# Patient Record
Sex: Male | Born: 1955 | Race: White | Hispanic: No | Marital: Single | Smoking: Former smoker
Health system: Southern US, Community
[De-identification: ages and names within clinical notes are randomized; demographics above are authoritative.]

## PROBLEM LIST (undated history)

## (undated) DIAGNOSIS — R011 Cardiac murmur, unspecified: Secondary | ICD-10-CM

## (undated) DIAGNOSIS — J441 Chronic obstructive pulmonary disease with (acute) exacerbation: Secondary | ICD-10-CM

## (undated) DIAGNOSIS — C801 Malignant (primary) neoplasm, unspecified: Secondary | ICD-10-CM

## (undated) DIAGNOSIS — J189 Pneumonia, unspecified organism: Secondary | ICD-10-CM

## (undated) DIAGNOSIS — R0902 Hypoxemia: Secondary | ICD-10-CM

## (undated) DIAGNOSIS — Z720 Tobacco use: Principal | ICD-10-CM

## (undated) DIAGNOSIS — J449 Chronic obstructive pulmonary disease, unspecified: Secondary | ICD-10-CM

## (undated) DIAGNOSIS — R0600 Dyspnea, unspecified: Secondary | ICD-10-CM

## (undated) DIAGNOSIS — M1990 Unspecified osteoarthritis, unspecified site: Secondary | ICD-10-CM

## (undated) DIAGNOSIS — I499 Cardiac arrhythmia, unspecified: Secondary | ICD-10-CM

## (undated) DIAGNOSIS — J439 Emphysema, unspecified: Secondary | ICD-10-CM

## (undated) DIAGNOSIS — Z87442 Personal history of urinary calculi: Secondary | ICD-10-CM

## (undated) HISTORY — DX: Chronic obstructive pulmonary disease, unspecified: J44.9

## (undated) HISTORY — DX: Hypoxemia: R09.02

## (undated) HISTORY — DX: Emphysema, unspecified: J43.9

## (undated) HISTORY — DX: Tobacco use: Z72.0

## (undated) HISTORY — PX: CATARACT EXTRACTION, BILATERAL: SHX1313

## (undated) HISTORY — DX: Chronic obstructive pulmonary disease with (acute) exacerbation: J44.1

---

## 2000-06-18 ENCOUNTER — Emergency Department (HOSPITAL_COMMUNITY): Admission: EM | Admit: 2000-06-18 | Discharge: 2000-06-18 | Payer: Self-pay | Admitting: Emergency Medicine

## 2000-06-19 ENCOUNTER — Emergency Department (HOSPITAL_COMMUNITY): Admission: EM | Admit: 2000-06-19 | Discharge: 2000-06-19 | Payer: Self-pay

## 2000-06-28 ENCOUNTER — Encounter: Payer: Self-pay | Admitting: Urology

## 2000-06-28 ENCOUNTER — Encounter: Admission: RE | Admit: 2000-06-28 | Discharge: 2000-06-28 | Payer: Self-pay | Admitting: Urology

## 2000-07-16 ENCOUNTER — Encounter: Payer: Self-pay | Admitting: Urology

## 2000-07-16 ENCOUNTER — Encounter: Admission: RE | Admit: 2000-07-16 | Discharge: 2000-07-16 | Payer: Self-pay | Admitting: Urology

## 2000-07-31 ENCOUNTER — Encounter: Payer: Self-pay | Admitting: Urology

## 2000-07-31 ENCOUNTER — Ambulatory Visit (HOSPITAL_COMMUNITY): Admission: RE | Admit: 2000-07-31 | Discharge: 2000-07-31 | Payer: Self-pay | Admitting: Urology

## 2008-02-12 ENCOUNTER — Emergency Department (HOSPITAL_COMMUNITY): Admission: EM | Admit: 2008-02-12 | Discharge: 2008-02-12 | Payer: Self-pay | Admitting: Emergency Medicine

## 2010-09-23 NOTE — Op Note (Signed)
[facility]  Patient:    Frank [name], Frank [name]                       MRN: [id number] Proc. Date: 07/31/00 Adm. Date:  [id number] Attending:  Londell Moh                           Operative Report  SERVICE:  Urology.  PREOPERATIVE DIAGNOSES:  Left ureteral calculus.  POSTOPERATIVE DIAGNOSES:  Left ureteral calculus.  PROCEDURE:  Cystoscopy attempted left retrograde, attempted left ureteroscopy.  SURGEON:  Dr. Logan Bores.  ANESTHESIA:  General.  COMPLICATIONS:  Inability to pass ureteral catheter and wire up to stone.  BRIEF HISTORY:  This 55 year old male is known to have a stone in the left ureter. The patient has been carefully followed with serial x-rays and a stone has not passed below the level of the iliac crest. The patient was offered ESWL but he was unhappy with the idea of having to pass the stone fragments and he has requested ureteroscopy with attempted extraction and in situ laser fulguration if indicated. The patient has intermittent pain but has not yet passed the stone. He gave full and informed consent. He fully realizes he may require a stent postoperatively.  DESCRIPTION OF PROCEDURE:  After successful induction of general anesthesia, the patient was placed in the dorsal lithotomy position and prepped with Betadine and draped in the usual sterile fashion. Cystoscopy was performed, the urethra was visualized in its entirety. Beyond the verumontanum, there was lateral lobe hypertrophy which was much larger then one would expect for a male of only age 40 years. The bladder was carefully inspected and was free of any tumor or stones. Both ureteral orifices appeared unremarkable in terms of configuration and location. The patient had moderate trabeculation. The angle of the left ureter was noted to be extremely tight and multiple attempts at successful cannulation were unsuccessful. This was initially tried with the open end catheter  with the ureteral guidewire in place. A whistle-tip catheter was attempted as was a spiral-tip catheter. The ureteroscope was introduced and the ureteral opening was visualized and partially cannulated but a wire could not pass off at such an acute angle up into the ureter. The Glidewire was also tense and this was unsuccessful. A decision was made to discontinue the procedure, schedule the patient for an IVP and visualization of the distal ureter. If the patient has a stone that has moved in the position where it is not amendable to ESWL, will recommend temporary left percutaneous nephrostomy tube placement, passage of a guidewire down to the kidney and then use of that guidewire to dilate up the ureter and extract the stone. The patient will be scheduled for an IVP later today. DD:  07/31/00 TD:  07/31/00 Job: [id number]

## 2011-02-06 LAB — POCT CARDIAC MARKERS
Myoglobin, poc: 67.6
Troponin i, poc: <0.05

## 2011-02-06 LAB — DIFFERENTIAL
Basophils Absolute: 0.1
Basophils Relative: 1
Eosinophils Absolute: 0.1
Neutro Abs: 5.1
Neutrophils Relative %: 63

## 2011-02-06 LAB — POCT I-STAT, CHEM 8
Chloride: 101
HCT: 52
Hemoglobin: 17.7 — ABNORMAL HIGH
Potassium: 4.1
Sodium: 138

## 2011-02-06 LAB — CBC
HCT: 48.5
Hemoglobin: 16.2
MCHC: 33.4
MCV: 88.9
Platelets: 240
RBC: 5.46
RDW: 13
WBC: 8.2

## 2012-06-05 ENCOUNTER — Encounter (HOSPITAL_COMMUNITY): Payer: Self-pay

## 2012-06-05 ENCOUNTER — Inpatient Hospital Stay (HOSPITAL_COMMUNITY)
Admission: EM | Admit: 2012-06-05 | Discharge: 2012-06-10 | DRG: 088 | Disposition: A | Discharge status: Home / Self Care | Payer: BC Managed Care – PPO | Attending: Infectious Disease | Admitting: Infectious Disease

## 2012-06-05 ENCOUNTER — Emergency Department (HOSPITAL_COMMUNITY): Payer: BC Managed Care – PPO

## 2012-06-05 DIAGNOSIS — Z720 Tobacco use: Secondary | ICD-10-CM

## 2012-06-05 DIAGNOSIS — R0902 Hypoxemia: Secondary | ICD-10-CM

## 2012-06-05 DIAGNOSIS — Z792 Long term (current) use of antibiotics: Secondary | ICD-10-CM

## 2012-06-05 DIAGNOSIS — IMO0002 Reserved for concepts with insufficient information to code with codable children: Secondary | ICD-10-CM

## 2012-06-05 DIAGNOSIS — J069 Acute upper respiratory infection, unspecified: Secondary | ICD-10-CM | POA: Diagnosis present

## 2012-06-05 DIAGNOSIS — Z79899 Other long term (current) drug therapy: Secondary | ICD-10-CM

## 2012-06-05 DIAGNOSIS — Z9981 Dependence on supplemental oxygen: Secondary | ICD-10-CM | POA: Diagnosis present

## 2012-06-05 DIAGNOSIS — J449 Chronic obstructive pulmonary disease, unspecified: Secondary | ICD-10-CM

## 2012-06-05 DIAGNOSIS — J9611 Chronic respiratory failure with hypoxia: Secondary | ICD-10-CM | POA: Diagnosis present

## 2012-06-05 DIAGNOSIS — F17200 Nicotine dependence, unspecified, uncomplicated: Secondary | ICD-10-CM | POA: Diagnosis present

## 2012-06-05 DIAGNOSIS — J439 Emphysema, unspecified: Secondary | ICD-10-CM

## 2012-06-05 DIAGNOSIS — J441 Chronic obstructive pulmonary disease with (acute) exacerbation: Principal | ICD-10-CM | POA: Diagnosis present

## 2012-06-05 DIAGNOSIS — I4510 Unspecified right bundle-branch block: Secondary | ICD-10-CM | POA: Diagnosis present

## 2012-06-05 HISTORY — DX: Tobacco use: Z72.0

## 2012-06-05 HISTORY — DX: Chronic obstructive pulmonary disease, unspecified: J44.9

## 2012-06-05 LAB — BASIC METABOLIC PANEL
BUN: 18 mg/dL (ref 6–23)
Calcium: 9.1 mg/dL (ref 8.4–10.5)
GFR calc Af Amer: >90 mL/min (ref >90)
GFR calc non Af Amer: >90 mL/min (ref >90)
Glucose, Bld: 100 mg/dL — ABNORMAL HIGH (ref 70–99)
Potassium: 4 mEq/L (ref 3.5–5.1)
Sodium: 136 mEq/L (ref 135–145)

## 2012-06-05 LAB — CBC WITH DIFFERENTIAL/PLATELET
Basophils Absolute: 0 10*3/uL (ref 0.0–0.1)
Eosinophils Absolute: 0.1 10*3/uL (ref 0.0–0.7)
HCT: 47.8 % (ref 39.0–52.0)
Lymphocytes Relative: 20 % (ref 12–46)
Lymphs Abs: 1.7 10*3/uL (ref 0.7–4.0)
MCHC: 34.1 g/dL (ref 30.0–36.0)
Monocytes Relative: 14 % — ABNORMAL HIGH (ref 3–12)
Neutro Abs: 5.4 10*3/uL (ref 1.7–7.7)
Platelets: 209 10*3/uL (ref 150–400)
RDW: 12.8 % (ref 11.5–15.5)
WBC: 8.4 10*3/uL (ref 4.0–10.5)

## 2012-06-05 LAB — CG4 I-STAT (LACTIC ACID): Lactic Acid, Venous: 0.85 mmol/L (ref 0.5–2.2)

## 2012-06-05 LAB — POCT I-STAT 3, ART BLOOD GAS (G3+)
Bicarbonate: 27.6 mEq/L — ABNORMAL HIGH (ref 20.0–24.0)
Patient temperature: 97.7
TCO2: 29 mmol/L (ref 0–100)
pCO2 arterial: 43.7 mmHg (ref 35.0–45.0)
pH, Arterial: 7.406 (ref 7.350–7.450)
pO2, Arterial: 62 mmHg — ABNORMAL LOW (ref 80.0–100.0)

## 2012-06-05 LAB — URINALYSIS, ROUTINE W REFLEX MICROSCOPIC
Bilirubin Urine: NEGATIVE
Ketones, ur: 15 mg/dL — AB
Leukocytes, UA: NEGATIVE
Nitrite: NEGATIVE
Protein, ur: NEGATIVE mg/dL
Urobilinogen, UA: 2 mg/dL — ABNORMAL HIGH (ref 0.0–1.0)

## 2012-06-05 MED ORDER — PREDNISONE 20 MG PO TABS
60.0000 mg | ORAL_TABLET | Freq: Once | ORAL | Status: AC
  Administered 2012-06-05: 60 mg via ORAL
  Filled 2012-06-05: qty 3

## 2012-06-05 MED ORDER — SODIUM CHLORIDE 0.9 % IJ SOLN
3.0000 mL | INTRAMUSCULAR | Status: DC | PRN
  Administered 2012-06-08: 3 mL via INTRAVENOUS

## 2012-06-05 MED ORDER — DEXTROSE 5 % IV SOLN
500.0000 mg | Freq: Once | INTRAVENOUS | Status: AC
  Administered 2012-06-05: 500 mg via INTRAVENOUS
  Filled 2012-06-05: qty 500

## 2012-06-05 MED ORDER — ALBUTEROL SULFATE (5 MG/ML) 0.5% IN NEBU
2.5000 mg | INHALATION_SOLUTION | Freq: Once | RESPIRATORY_TRACT | Status: AC
  Administered 2012-06-05: 2.5 mg via RESPIRATORY_TRACT
  Filled 2012-06-05: qty 0.5

## 2012-06-05 MED ORDER — SODIUM CHLORIDE 0.9 % IJ SOLN
3.0000 mL | Freq: Two times a day (BID) | INTRAMUSCULAR | Status: DC
  Administered 2012-06-06 – 2012-06-10 (×9): 3 mL via INTRAVENOUS

## 2012-06-05 MED ORDER — IPRATROPIUM BROMIDE 0.02 % IN SOLN
0.5000 mg | RESPIRATORY_TRACT | Status: DC
  Administered 2012-06-05: 12:00:00 via RESPIRATORY_TRACT
  Administered 2012-06-05 – 2012-06-07 (×10): 0.5 mg via RESPIRATORY_TRACT
  Filled 2012-06-05 (×11): qty 2.5

## 2012-06-05 MED ORDER — SODIUM CHLORIDE 0.9 % IV SOLN
1000.0000 mL | INTRAVENOUS | Status: DC
  Administered 2012-06-05 (×4): 1000 mL via INTRAVENOUS

## 2012-06-05 MED ORDER — SODIUM CHLORIDE 0.9 % IV SOLN: 250.0000 mL | INTRAVENOUS | Status: DC | PRN

## 2012-06-05 MED ORDER — ENOXAPARIN SODIUM 40 MG/0.4ML ~~LOC~~ SOLN
40.0000 mg | SUBCUTANEOUS | Status: DC
  Administered 2012-06-05 – 2012-06-09 (×5): 40 mg via SUBCUTANEOUS
  Filled 2012-06-05 (×6): qty 0.4

## 2012-06-05 MED ORDER — DEXTROSE 5 % IV SOLN
1.0000 g | Freq: Once | INTRAVENOUS | Status: AC
  Administered 2012-06-05: 1 g via INTRAVENOUS
  Filled 2012-06-05: qty 10

## 2012-06-05 MED ORDER — IPRATROPIUM BROMIDE 0.02 % IN SOLN
RESPIRATORY_TRACT | Status: AC
  Filled 2012-06-05: qty 2.5

## 2012-06-05 MED ORDER — ALBUTEROL SULFATE (5 MG/ML) 0.5% IN NEBU
INHALATION_SOLUTION | RESPIRATORY_TRACT | Status: AC
  Filled 2012-06-05: qty 1

## 2012-06-05 MED ORDER — LORAZEPAM 0.5 MG PO TABS
0.5000 mg | ORAL_TABLET | Freq: Once | ORAL | Status: DC
  Filled 2012-06-05: qty 1

## 2012-06-05 MED ORDER — ALBUTEROL SULFATE (5 MG/ML) 0.5% IN NEBU
2.5000 mg | INHALATION_SOLUTION | RESPIRATORY_TRACT | Status: DC
  Administered 2012-06-05: 13:00:00 via RESPIRATORY_TRACT
  Administered 2012-06-05 – 2012-06-07 (×10): 2.5 mg via RESPIRATORY_TRACT
  Filled 2012-06-05 (×10): qty 0.5

## 2012-06-05 MED ORDER — IPRATROPIUM BROMIDE 0.02 % IN SOLN
0.5000 mg | Freq: Once | RESPIRATORY_TRACT | Status: AC
  Administered 2012-06-05: 0.5 mg via RESPIRATORY_TRACT
  Filled 2012-06-05: qty 2.5

## 2012-06-05 MED ORDER — NICOTINE 21 MG/24HR TD PT24
21.0000 mg | MEDICATED_PATCH | Freq: Every day | TRANSDERMAL | Status: DC
  Administered 2012-06-05 – 2012-06-10 (×6): 21 mg via TRANSDERMAL
  Filled 2012-06-05 (×6): qty 1

## 2012-06-05 NOTE — ED Notes (Signed)
Pt placed on portable cardiac monitor and 3L o2 for transport. EMT to take pt.

## 2012-06-05 NOTE — ED Notes (Signed)
Patients urine collected.  Label placed on patients urine.  Name and and mrn number checked.  Employee number, initials, date and time placed on the label. Tubed.

## 2012-06-05 NOTE — H&P (Signed)
Internal Medicine Teaching Service Resident Admission Note Date: 06/05/2012  Patient name: Frank [name] Medical record number: [id number] Date of birth: May 06, 1956 Age: 57 y.o. Gender: male PCP: Default, Provider, MD  Medical Service: Internal Medicine Teaching Service-[facility]  I have reviewed the note by Gentry Roch MS 4 and was present during the interview and physical exam.  Please see below for findings, assessment, and plan.  Chief Complaint: shortness of breath  History of Present Illness:  Frank [name]  is a 57 yo male with presumed COPD who presents to [facility] ED for shortness of breath and low oxygen levels noted on home monitoring today.  He has been treated for upper respiratory infection and COPD exacerbation at [facility] several times over the past 5 days.  He was prescribed a course of azithromycin, prednisone, cough suppressants, nebulized albuterol and Advair diskus (fluticasone-salmeterol) inhaler with some improvement in his symptoms of runny nose, nasal congestion, headfullness and dry cough but states that he is still short of breath when he "moves about".  He presented on request of his sister who checked his oxygen level at home and noted that it was "84%".  She states that by coming to the ED Frank [name] would be able to get a primary doctor and oxygen at home. He denies chest pain, orthopnea, headaches, nausea or vomiting but states that he had fever on initial presentation to urgent care several days ago.  Of note, the patient initially requested discharge from the ED but after persuasion by his sister agreed to be admitted.  Meds:  No current facility-administered medications on file prior to encounter.   Current Outpatient Prescriptions on File Prior to Encounter  Medication Sig Dispense Refill  . albuterol (ACCUNEB) 0.63 MG/3ML nebulizer solution Take 1 ampule by nebulization every 6 (six) hours as needed. For cough/wheezing      .  Fluticasone-Salmeterol (ADVAIR) 500-50 MCG/DOSE AEPB Inhale 1 puff into the lungs every 12 (twelve) hours.        Allergies: Allergies as of 06/05/2012  . (No Known Allergies)    Past Medical History: Medical Student note reviewed  Family History: Medical Student note reviewed  Social History: Medical Student note reviewed  Surgical History: Medical Student note reviewed  Review of System: Medical Student note reviewed  Physical Exam: Blood pressure 114/68, pulse 77, temperature 97.7 F (36.5 C), temperature source Oral, resp. rate 22, height 5\' 8"  (1.727 m), weight 167 lb (75.751 kg), SpO2 93.00%. on 3L [place] General: Well-developed, well-nourished, in no acute distress; nasal phonation to his speach Head: Normocephalic, atraumatic. Eyes: PERRLA, EOMI, No signs of anemia or jaundice. Nose: Moist mucous membranes, no purulence, hypertrophied nasal turbinates, rubrous nose with telectangias Throat: Oropharynx nonerythematous, no exudate appreciated, poor dentition Neck: supple, no masses, no carotid Bruits, no JVD appreciated, no thyroidmegaly appreciated Lungs: mildly increased  respiratory effort. Few scattered expiratory wheezes throughout bilateral lung fields, good movement of air Heart: normal rate, regular rhythm, normal S1 and S2, no gallop, murmur, or rubs appreciated. Abdomen: BS normoactive. Soft, obese, non-tender. No masses or organomegaly appreciated, cherry hemangiomas noted Extremities: Trace pretibial edema, distal pulses intact Neurologic: grossly non-focal, alert and oriented x3, appropriate and cooperative throughout examination.   Labs: Reviewed as noted in the Electronic Record  Imaging: Reviewed as noted in the Electronic Record  Assessment & Plan by Problem: 57 yo male with tobacco abuse and presumed COPD recently treated for COPD exacerbation now admitted with shortness of breath and mild  hypoxia.  1. COPD in setting of Upper Respiratory infection  with recent exacerbation: His clinical picture today is that of a recent COPD exacerbation that has been appropriately treated and responded to bronchodilators, steroids, and antibiotics.  He is currently afebrile, without leukocytosis, and with improvement in his cough.  On exam he maintained oxygenation well on room air at 91-92%. He reports continued shortness of breath which has improved from initial presentation to Urgent Care but still causes discomfort.  He has no signs of pulmonary edema, pneumonia or ARDS on chest XRay. His physical exam doesn't demonstrate crackles, increased fremitus or bronchial breaths but given his previous fever, nonproductive cough and rhinitis,  viral pneumonia is a possible etiology for his continued shortness of breath and mild wheezes.  Thus far there is no evidence of cardiac ischemia with negative troponins and EKG with RBBB. Although he is without lower extremity edema, we will need to consider congestive heart failure thus check pro-BNP with consideration for 2D echocardiogram.  He is at low risk for pulmonary embolism given his lack of risk factors (<65, no previous DVT or PE, no recent surgeries, no known malignancy), no lower extremity pain or hemoptysis.  His Geneva score of 3 for heart rate of 77 bpm indicates low risk for PE. Arterial Blood Gas with normal pH, no CO2 retention and decreased p02 of is consistent with hypoxemia and his current levels of ~91-92% on room air. -check influenza panel -check pro-BNP -check Magnesium level given recent beta-adrenergic agonist therapy which may increase renal excretion of calcium and magnesium -trend cardiac enzymes -continue supplemental oxygen -nebulized ipratropium-albuterol q4h -repeat EKG tomorrow -tobacco abuse counseling  -nicotine patch   Lab 06/05/12 1828  PHART 7.406  PCO2ART 43.7  PO2ART 62.0*  HCO3 27.6*  TCO2 29  O2SAT 92.0   2. Hypoxemia: A-a gradient elevated at 34 mmHg (normal for age  ~18.21mmHg), considering causes of hypoxemia including ventilation perfusion mismatch (CXR w/o pneumonia, atelectasis, or ARDS; pro-BNP low and not indicative of CHF); shunt (low risk for PE as noted above, will check ECHO for cardiac structure); alveolar hypoventilation (no known history of interstitial lung disease); hypoventilation secondary to COPD is more probable in this patient. Will likely require home oxygen given initial low oxygen levels of 87% on room air. -will monitor oxygen level while ambulating and at rest on room air -consider check D-dimer given low DVT risk in setting of hypoxemia to r/o PE -cont to encourage tobacco cessation -will need outpatient PFTs after resolution of acute illness  3. Disposition: likely discharge home today after 2D ECHO on bronchodilators.   Will schedule OPC follow-up.  No transportation needs.      SignedKristie Cowman 06/05/2012, 6:10 PM

## 2012-06-05 NOTE — ED Notes (Signed)
Pt was seen at Urgent Care for fever and not feeling good. Had low O2 sats at 84 % and afebrile. Denies any pain. Feels "stuffy" in his sinuses.

## 2012-06-05 NOTE — ED Notes (Signed)
Patient returned from X-ray 

## 2012-06-05 NOTE — ED Notes (Signed)
Brother in law Brett Canales : [phone] going to eat lunch and would like to be notified if pt leaves room.

## 2012-06-05 NOTE — ED Notes (Signed)
Results of lactic acid shown to Dr. Effie Shy

## 2012-06-05 NOTE — ED Provider Notes (Addendum)
History     CSN: [id number]  Arrival date & time 06/05/12  1117   First MD Initiated Contact with Patient 06/05/12 1128      Chief Complaint  Patient presents with  . Fever  . low sats     (Consider location/radiation/quality/duration/timing/severity/associated sxs/prior treatment) HPI Comments:  Frank [name] is a 57 y.o. Male who has been ill for 5 days with shortness of breath, dyspnea on exertion, fever, and cough. He was evaluated and treated at an urgent care center, for pneumonia. His medications included; nebulizer, antibiotic, and antipyretic. He was seen in followup. This morning, found to be hypoxic, and sent here. His room air sat was 82% by report. His wife has been checking his oxygen at home, and found it varying between 84, and 92%; with the lows being while ambulating. There's been no chest pain, nausea, vomiting, weak, as dizziness, or back pain. There are no other modifying factors.  Patient is a 57 y.o. male presenting with fever. The history is provided by the patient and the spouse.  Fever Primary symptoms of the febrile illness include fever.    History reviewed. No pertinent past medical history.  Past Surgical History  Procedure Date  . Cataract extraction, bilateral     No family history on file.  History  Substance Use Topics  . Smoking status: Current Every Day Smoker -- 1.0 packs/day    Types: Cigarettes  . Smokeless tobacco: Not on file  . Alcohol Use: No      Review of Systems  Constitutional: Positive for fever.  All other systems reviewed and are negative.    Allergies  Review of patient's allergies indicates no known allergies.  Home Medications   Current Outpatient Rx  Name  Route  Sig  Dispense  Refill  . ALBUTEROL SULFATE 0.63 MG/3ML IN NEBU   Nebulization   Take 1 ampule by nebulization every 6 (six) hours as needed. For cough/wheezing         . ALBUTEROL SULFATE HFA 108 (90 BASE) MCG/ACT IN AERS   Inhalation  Inhale 1-2 puffs into the lungs every 6 (six) hours as needed. For wheezing         . FLUTICASONE-SALMETEROL 500-50 MCG/DOSE IN AEPB   Inhalation   Inhale 1 puff into the lungs every 12 (twelve) hours.         Marland Kitchen HYDROCOD POLST-CPM POLST ER 10-8 MG PO CP12   Oral   Take 1 capsule by mouth every 12 (twelve) hours.           BP 114/68  Pulse 77  Temp 97.7 F (36.5 C) (Oral)  Resp 22  Ht 5\' 8"  (1.727 m)  Wt 167 lb (75.751 kg)  BMI 25.39 kg/m2  SpO2 93%  Physical Exam  Nursing note and vitals reviewed. Constitutional: He is oriented to person, place, and time. He appears well-developed and well-nourished.  HENT:  Head: Normocephalic and atraumatic.  Right Ear: External ear normal.  Left Ear: External ear normal.  Eyes: Conjunctivae normal and EOM are normal. Pupils are equal, round, and reactive to light.  Neck: Normal range of motion and phonation normal. Neck supple.  Cardiovascular: Normal rate, regular rhythm, normal heart sounds and intact distal pulses.   Pulmonary/Chest: Effort normal. He exhibits no bony tenderness.       Decreased air movement bilaterally with generalized wheezing. Mildly increased work of breathing.  Abdominal: Soft. Normal appearance. There is no tenderness.  Musculoskeletal: Normal range of motion.  Neurological: He is alert and oriented to person, place, and time. He has normal strength. No cranial nerve deficit or sensory deficit. He exhibits normal muscle tone. Coordination normal.  Skin: Skin is warm, dry and intact.  Psychiatric: He has a normal mood and affect. His behavior is normal. Judgment and thought content normal.    ED Course  Procedures (including critical care time)  Emergency department treatment: Oxygen titration on nasal cannula to normal. IV, Rocephin and Zithromax, for possible community-acquired pneumonia. Nebulizer x2.  Reevaluation: 16:50- oxygen removed in his oxygen. Sats dropped to 87% within 5 minutes; indicating  ongoing hypoxia. Prednisone, ordered.     Date: 02/23/2012  Rate: 87  Rhythm: normal sinus rhythm  QRS Axis: normal  PR and QT Intervals: normal  ST/T Wave abnormalities: normal  PR and QRS Conduction Disutrbances:right bundle branch block  Narrative Interpretation:   Old EKG Reviewed: none available        Labs Reviewed  CBC WITH DIFFERENTIAL - Abnormal; Notable for the following:    Monocytes Relative 14 (*)     Monocytes Absolute 1.2 (*)     All other components within normal limits  BASIC METABOLIC PANEL - Abnormal; Notable for the following:    Glucose, Bld 100 (*)     All other components within normal limits  URINALYSIS, ROUTINE W REFLEX MICROSCOPIC - Abnormal; Notable for the following:    Ketones, ur 15 (*)     Urobilinogen, UA 2.0 (*)     All other components within normal limits  CG4 I-STAT (LACTIC ACID)  CULTURE, BLOOD (ROUTINE X 2)  CULTURE, BLOOD (ROUTINE X 2)  URINE CULTURE  INFLUENZA PANEL BY PCR   Dg Chest 2 View  06/05/2012  *RADIOLOGY REPORT*  Clinical Data: Fever.  Shortness of breath.  CHEST - 2 VIEW  Comparison: 02/12/2008  Findings: Lungs appear hyperexpanded with flattening of the hemidiaphragms, increased retrosternal air space and pruning of the pulmonary vasculature in the periphery, suggestive of underlying COPD.  Mild diffuse bronchial wall thickening, similar to prior examinations.  No acute consolidative airspace disease.  No pleural effusions.  No evidence of pulmonary edema.  Heart size and mediastinal contours are within normal limits.  Atherosclerotic calcifications are noted within the arch of the aorta.  IMPRESSION: 1.  Chronic changes compatible with COPD redemonstrated, as above. 2.  Atherosclerosis.   Original Report Authenticated By: Trudie Reed, M.D.    Nursing notes, applicable records and vitals reviewed.  Radiologic Images/Reports reviewed.   1. COPD exacerbation   2. Hypoxia       MDM  COPD exacerbation, with  hypoxia. Patient does not have a primary care provider. Will admit for stabilization. Doubt sepsis.    Plan: Admit    Flint Melter, MD 06/05/12 1740  Flint Melter, MD 06/05/12 1759
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
## 2012-06-05 NOTE — ED Notes (Signed)
Pt states that he is feeling SOB again.  Pt is noted to be sating 91% on 3L [place].  No distress noted.  Bilateral inspiratory and expiratory wheezes noted.  Pt is able to speak in complete sentences.

## 2012-06-06 LAB — URINE CULTURE

## 2012-06-06 LAB — BASIC METABOLIC PANEL
Calcium: 9.1 mg/dL (ref 8.4–10.5)
Creatinine, Ser: 0.76 mg/dL (ref 0.50–1.35)
GFR calc Af Amer: >90 mL/min (ref >90)
GFR calc non Af Amer: >90 mL/min (ref >90)
Sodium: 135 mEq/L (ref 135–145)

## 2012-06-06 LAB — TROPONIN I
Troponin I: <0.3 ng/mL (ref <0.30)
Troponin I: <0.3 ng/mL (ref <0.30)
Troponin I: <0.3 ng/mL (ref <0.30)

## 2012-06-06 LAB — CBC
Platelets: 203 10*3/uL (ref 150–400)
RBC: 5.31 MIL/uL (ref 4.22–5.81)
RDW: 12.8 % (ref 11.5–15.5)
WBC: 8.9 10*3/uL (ref 4.0–10.5)

## 2012-06-06 LAB — LEGIONELLA ANTIGEN, URINE: Legionella Antigen, Urine: NEGATIVE

## 2012-06-06 LAB — INFLUENZA PANEL BY PCR (TYPE A & B)
H1N1 flu by pcr: NOT DETECTED
Influenza A By PCR: NEGATIVE
Influenza B By PCR: NEGATIVE

## 2012-06-06 LAB — HIV ANTIBODY (ROUTINE TESTING W REFLEX): HIV: NONREACTIVE

## 2012-06-06 LAB — PRO B NATRIURETIC PEPTIDE: Pro B Natriuretic peptide (BNP): 18 pg/mL (ref 0–125)

## 2012-06-06 LAB — D-DIMER, QUANTITATIVE: D-Dimer, Quant: <0.27 ug/mL-FEU (ref 0.00–0.48)

## 2012-06-06 MED ORDER — PNEUMOCOCCAL VAC POLYVALENT 25 MCG/0.5ML IJ INJ
0.5000 mL | INJECTION | INTRAMUSCULAR | Status: AC
  Administered 2012-06-07: 0.5 mL via INTRAMUSCULAR
  Filled 2012-06-06: qty 0.5

## 2012-06-06 MED ORDER — METHYLPREDNISOLONE SODIUM SUCC 40 MG IJ SOLR
40.0000 mg | Freq: Once | INTRAMUSCULAR | Status: AC
  Administered 2012-06-06: 40 mg via INTRAVENOUS
  Filled 2012-06-06: qty 1

## 2012-06-06 MED ORDER — INFLUENZA VIRUS VACC SPLIT PF IM SUSP
0.5000 mL | INTRAMUSCULAR | Status: AC
  Administered 2012-06-07: 0.5 mL via INTRAMUSCULAR
  Filled 2012-06-06: qty 0.5

## 2012-06-06 NOTE — Progress Notes (Signed)
Pt oxygen saturations were 88-90% on room air at rest.  Pt ambulated approximately 30 feet with oxygen sats dropping downwards to 78%.  Returned to room and applied 2 liters oxygen via [place].  Sats returned to 92%.  MD in room and spoke with patient about needing home oxygen and need to quit smoking.  Pt resting with call bell within reach.  Will continue to monitor. Thomas Hoff

## 2012-06-06 NOTE — Progress Notes (Signed)
*  PRELIMINARY RESULTS* Echocardiogram 2D Echocardiogram has been performed.  Frank [name] 06/06/2012, 1:57 PM

## 2012-06-06 NOTE — Progress Notes (Addendum)
Resident Addendum to Medical Student Note   I have seen and examined the patient, and agree with the the medical student assessment and plan outlined above. Please see my brief note below for additional details.  S: No acute events overnight.   OBJECTIVE: VS: Reviewed  Meds: Reviewed  Labs: Reviewed  Imaging: Reviewed   Physical Exam: General: Vital signs reviewed and noted. Well-developed, well-nourished, in no acute distress; alert, appropriate and cooperative throughout examination.  HEENT: Normocephalic, atraumatic  Lungs:  Normal respiratory effort. Continued scattered wheezes left lung field and right upper lung fields, no rales appreciated  Heart: RRR. S1 and S2 normal without gallop, murmur, or rubs appreciated.  Abdomen:  BS normoactive. Soft, obese, non-tender.  No masses or organomegaly.  Extremities: No pretibial edema.     ASSESSMENT/ PLAN: Pt is a 57 y.o. yo male with a PMHx of tobacco abuse and recent URI with likely COPD exacerbation who was admitted on 06/05/2012 with symptoms of shortness of breath and hypoxia.   COPD with hypoxia: recent exacerbation secondary to Upper Respiratory Infection, treated with 5 day course azithromycin, prednisone, nebulized albuterol prn and Advair (salmeterol/fluticasone).  -cont Duonebs prn  -cont course of prednisone after Solu-Medrol IV 40 mg x1  -cont Advair  -2D ECHO today  -monitor O2 on room air at rest and ambulation.   DVT ppx: Lovenox [place] qd   Disposition: likely discharge home today or tomorrow following ECHO, f/u OPC   Length of Stay: 1   Agusta Hackenberg  06/06/2012, 10:00 AM

## 2012-06-06 NOTE — H&P (Addendum)
Internal Medicine Attending Admission Note Date: 06/06/2012  Patient name: Frank [name] Medical record number: [id number] Date of birth: [birth year] Age: 57 y.o. Gender: male  I saw and evaluated the patient. I reviewed the resident's note and I agree with the resident's findings and plan as documented in the resident's note, with the following additional comments.  Chief Complaint(s): Shortness of breath  History - key components related to admission: Patient is a 57 year old man with history of presumed COPD (no prior pulmonary function studies per patient), long smoking history, recently treated for COPD exacerbation with oral azithromycin and a short course of prednisone at an outside clinic, admitted with complaint of shortness of breath and hypoxia noted on a home measurement by a family member.   Physical Exam - key components related to admission:  Filed Vitals:   06/06/12 0321 06/06/12 0543 06/06/12 1238 06/06/12 1320  BP:  133/76  152/80  Pulse:  79  88  Temp:  97.9 F (36.6 C)  98.5 F (36.9 C)  TempSrc:  Oral  Oral  Resp:  22  23  Height:      Weight:  173 lb 6.4 oz (78.654 kg)    SpO2: 96% 92% 92% 90%    General: Alert, mildly tachypneic Lungs: Mild diffuse expiratory wheezing Heart: Regular; no S3, no S4, no murmurs Abdomen: Bowel sounds present, soft, nontender; no hepatosplenomegaly Extremities: No edema   Lab results:   Basic Metabolic Panel:  Basename 06/06/12 0640 06/05/12 1128  NA 135 136  K 4.3 4.0  CL 97 96  CO2 26 27  GLUCOSE 117* 100*  BUN 16 18  CREATININE 0.76 0.80  CALCIUM 9.1 9.1  MG -- --  PHOS -- --    CBC:  Basename 06/06/12 0640 06/05/12 1128  WBC 8.9 8.4  NEUTROABS -- 5.4  HGB 15.3 16.3  HCT 45.4 47.8  MCV 85.5 85.5  PLT 203 209    Cardiac Enzymes:  Basename 06/06/12 1159 06/06/12 0640 06/06/12 0040  CKTOTAL -- -- --  CKMB -- -- --  CKMBINDEX -- -- --  TROPONINI <0.30 <0.30 <0.30    ABG (FIO2 not recorded)     Component Value Date/Time   PHART 7.406 06/05/2012 1828   PCO2ART 43.7 06/05/2012 1828   PO2ART 62.0* 06/05/2012 1828   HCO3 27.6* 06/05/2012 1828   TCO2 29 06/05/2012 1828   O2SAT 92.0 06/05/2012 1828     D-Dimer:  Basename 06/06/12 0845  DDIMER <0.27   Urinalysis    Component Value Date/Time   COLORURINE YELLOW 06/05/2012 1236   APPEARANCEUR CLEAR 06/05/2012 1236   LABSPEC 1.023 06/05/2012 1236   PHURINE 6.5 06/05/2012 1236   GLUCOSEU NEGATIVE 06/05/2012 1236   HGBUR NEGATIVE 06/05/2012 1236   BILIRUBINUR NEGATIVE 06/05/2012 1236   KETONESUR 15* 06/05/2012 1236   PROTEINUR NEGATIVE 06/05/2012 1236   UROBILINOGEN 2.0* 06/05/2012 1236   NITRITE NEGATIVE 06/05/2012 1236   LEUKOCYTESUR NEGATIVE 06/05/2012 1236    Imaging results:  Dg Chest 2 View  06/05/2012  *RADIOLOGY REPORT*  Clinical Data: Fever.  Shortness of breath.  CHEST - 2 VIEW  Comparison: 02/12/2008  Findings: Lungs appear hyperexpanded with flattening of the hemidiaphragms, increased retrosternal air space and pruning of the pulmonary vasculature in the periphery, suggestive of underlying COPD.  Mild diffuse bronchial wall thickening, similar to prior examinations.  No acute consolidative airspace disease.  No pleural effusions.  No evidence of pulmonary edema.  Heart size and mediastinal contours are within normal limits.  Atherosclerotic calcifications are noted within the arch of the aorta.  IMPRESSION: 1.  Chronic changes compatible with COPD redemonstrated, as above. 2.  Atherosclerosis.   Original Report Authenticated By: Trudie Reed, M.D.     Other results: EKG: Sinus rhythm; right bundle branch block   Assessment & Plan by Problem:  1.  COPD exacerbation.  Patient has significant chronic obstructive pulmonary disease, possibly exacerbated by recent URI.  He was hypoxic to 87% in the emergency department within 5 minutes of removing his nasal cannula O2; this morning when I saw patient he was hypoxic to 88% on room  air, and dropped to 78% on room air with ambulation.  Plans include steroids; inhaled bronchodilators; supplement oxygen and follow saturations.  He will need home oxygen.  At home he has been on albuterol inhaler and Advair; he would benefit long-term from the addition of an anticholinergic (ipratropium or Spiriva), and he will also need pulmonary function studies.  Smoking cessation is an absolute priority, and I emphasized this to patient.  Given the severity of his illness, outpatient referral to the pulmonary clinic is appropriate.  2.  Smoking.  I talked at length with patient about the importance of smoking cessation, and he agreed to try.  He reports that his current nicotine patch seems to be working well, so this will likely be a good option for assisting him with smoking cessation.  3.  Disposition.  Anticipate possible discharge tomorrow patient if does well overnight.

## 2012-06-06 NOTE — Progress Notes (Signed)
Utilization Review Completed.Frank [name] T1/30/2014   

## 2012-06-06 NOTE — Progress Notes (Signed)
Medical Student Daily Progress Note  Subjective: No acute events overnight.  Patient reports he still has some mild head fullness and nasal congestion.  Reports his breathing and SOB have improved since coming into the hospital and he feels he is back near his baseline.  Denies any fevers, chest pain, chills, nausea or vomiting.  Objective: Vital signs in last 24 hours: Filed Vitals:   06/05/12 2042 06/05/12 2302 06/06/12 0321 06/06/12 0543  BP: 150/90   133/76  Pulse:  76  79  Temp: 97.9 F (36.6 C)   97.9 F (36.6 C)  TempSrc:    Oral  Resp:    22  Height:      Weight:    78.654 kg (173 lb 6.4 oz)  SpO2: 94% 94% 96% 92%    Physical Exam: Vitals reviewed. General: resting in bed, NAD HEENT: atraumatic, normocephalic, EOMI, no scleral icterus Cardiac: RRR, no rubs, murmurs or gallops Pulm: diminished air movement throughout all lung fields, mild expiratory wheezes appreciated throughout all lung fields, no ronchi or wheezes Abd: soft, nontender, nondistended, BS normoactive Ext: warm and well perfused, no pedal edema Neuro: alert and oriented X3, cranial nerves II-XII grossly intact  Lab Results: Basic Metabolic Panel:  Lab 06/06/12 0454 06/05/12 1128  NA 135 136  K 4.3 4.0  CL 97 96  CO2 26 27  GLUCOSE 117* 100*  BUN 16 18  CREATININE 0.76 0.80  CALCIUM 9.1 9.1  MG -- --  PHOS -- --   CBC:  Lab 06/06/12 0640 06/05/12 1128  WBC 8.9 8.4  NEUTROABS -- 5.4  HGB 15.3 16.3  HCT 45.4 47.8  MCV 85.5 85.5  PLT 203 209   Cardiac Enzymes:  Lab 06/06/12 0640 06/06/12 0040  CKTOTAL -- --  CKMB -- --  CKMBINDEX -- --  TROPONINI <0.30 <0.30   BNP:  Lab 06/06/12 0040  PROBNP 18.0   D-Dimer: <0.27  Urinalysis:  Lab 06/05/12 1236  COLORURINE YELLOW  LABSPEC 1.023  PHURINE 6.5  GLUCOSEU NEGATIVE  HGBUR NEGATIVE  BILIRUBINUR NEGATIVE  KETONESUR 15*  PROTEINUR NEGATIVE  UROBILINOGEN 2.0*  NITRITE NEGATIVE  LEUKOCYTESUR NEGATIVE     Micro  Results: Recent Results (from the past 240 hour(s))  CULTURE, BLOOD (ROUTINE X 2)     Status: Normal (Preliminary result)   Collection Time   06/05/12  1:00 PM      Component Value Range Status Comment   Specimen Description BLOOD ARM LEFT   Final    Special Requests BOTTLES DRAWN AEROBIC AND ANAEROBIC 10CC   Final    Culture  Setup Time 06/05/2012 18:58   Final    Culture     Final    Value:        BLOOD CULTURE RECEIVED NO GROWTH TO DATE CULTURE WILL BE HELD FOR 5 DAYS BEFORE ISSUING A FINAL NEGATIVE REPORT   Report Status PENDING   Incomplete   CULTURE, BLOOD (ROUTINE X 2)     Status: Normal (Preliminary result)   Collection Time   06/05/12  1:00 PM      Component Value Range Status Comment   Specimen Description BLOOD RIGHT HAND   Final    Special Requests BOTTLES DRAWN AEROBIC AND ANAEROBIC 10CC   Final    Culture  Setup Time 06/05/2012 18:58   Final    Culture     Final    Value:        BLOOD CULTURE RECEIVED NO GROWTH TO DATE CULTURE  WILL BE HELD FOR 5 DAYS BEFORE ISSUING A FINAL NEGATIVE REPORT   Report Status PENDING   Incomplete    Studies/Results: Dg Chest 2 View  06/05/2012  *RADIOLOGY REPORT*  Clinical Data: Fever.  Shortness of breath.  CHEST - 2 VIEW  Comparison: 02/12/2008  Findings: Lungs appear hyperexpanded with flattening of the hemidiaphragms, increased retrosternal air space and pruning of the pulmonary vasculature in the periphery, suggestive of underlying COPD.  Mild diffuse bronchial wall thickening, similar to prior examinations.  No acute consolidative airspace disease.  No pleural effusions.  No evidence of pulmonary edema.  Heart size and mediastinal contours are within normal limits.  Atherosclerotic calcifications are noted within the arch of the aorta.  IMPRESSION: 1.  Chronic changes compatible with COPD redemonstrated, as above. 2.  Atherosclerosis.   Original Report Authenticated By: Trudie Reed, M.D.    Medications: I have reviewed the patient's  current medications. Scheduled Meds:   . ipratropium  0.5 mg Nebulization Q4H   And  . albuterol  2.5 mg Nebulization Q4H  . enoxaparin (LOVENOX) injection  40 mg Subcutaneous Q24H  . influenza  inactive virus vaccine  0.5 mL Intramuscular Tomorrow-1000  . LORazepam  0.5 mg Oral Once  . methylPREDNISolone sodium succinate  40 mg Intravenous Once  . nicotine  21 mg Transdermal Daily  . pneumococcal 23 valent vaccine  0.5 mL Intramuscular Tomorrow-1000  . sodium chloride  3 mL Intravenous Q12H   Continuous Infusions:  PRN Meds:.sodium chloride, sodium chloride  Assessment/Plan:  1. COPD in setting of Upper Respiratory infection with recent exacerbation: Patient treated at [facility] for presumed COPD exacerbation; treated and responded to bronchodilators, steroids, and antibiotics.  Now being treated with supplemental O2, nebulized ipratropium-albuterol, and solumedrol.  He continues to be afebrile, without leukocytosis, and with improvement in his cough.  On RA, patient desatted to 88% while sitting and 78% with ambulation.  He has no signs of pulmonary edema, pneumonia or ARDS on chest XRay.  There continues to be no evidence of cardiac ischemia with negative troponins x 3 and EKG with RBBB.  - influenza panel negative - pro-BNP normal   - cardiac enzymes negative x3  - d-dimer normal - continue supplemental oxygen  - nebulized ipratropium-albuterol q4h - IV solumedrol  - patient to have echo later this morning - given desat to 88% sitting and 78% with ambulation on RA, will be requiring home O2 - will need outpatient PFTs after resolution of acute illness   2. Smoking Cessation: Patient counseled on the risks of continuing smoking, especially in the setting of home O2.  He reports the nicotine patch while inpatient has helped curb his craving.  He also reports his sister is sending him some electronic cigarettes in the next few weeks, which he is interested in trying.   - nicotine patch -  further management in OPC   3. Disposition: likely discharge home tomorrow after 2D ECHO following improvement of symptoms. OPC follow-up scheduled for 06/18/12 at 10:00am. No transportation needs.     LOS: 1 day   This is a Psychologist, occupational Note.  The care of the patient was discussed with Dr. Bosie Clos and the assessment and plan formulated with their assistance.  Please see their attached note for official documentation of the daily encounter.  Sabino Dick T 06/06/2012, 9:50 AM  The patient does not have a current PCP (Default, Provider, MD), therefore will have OPC follow-up after discharge.   The patient does not have  transportation limitations that hinder transportation to clinic appointments.  .Services Needed at time of discharge: Y = Yes, Blank = No PT:   OT:   RN:   Equipment:   Other:

## 2012-06-06 NOTE — Progress Notes (Signed)
On call MD (Teachi[facility] service) paged & notified of Pt's blood culture result. No new orders received, will continue plan of care.

## 2012-06-06 NOTE — H&P (Signed)
Internal Medicine Teaching Service Resident Admission Note Date: 06/06/2012  Patient name: JAYSTON TREVINO Medical record number: [id number] Date of birth: 1956-01-15 Age: 57 y.o. Gender: male PCP: Default, Provider, MD  Medical Service: Internal Medicine Teaching Service - [place]  I have reviewed the note by Sabino Dick MS 4 and was present during the interview and physical exam.  Please see my separate note for findings, assessment, and plan.        SignedKristie Cowman 06/06/2012, 9:57 AM

## 2012-06-06 NOTE — Care Management Note (Signed)
    Page 1 of 2   06/10/2012     3:44:03 PM   CARE MANAGEMENT NOTE 06/10/2012  Patient:  Frank [name], Frank [name]   Account Number:  [id number]  Date Initiated:  06/06/2012  Documentation initiated by:  Jaiden Wahab  Subjective/Objective Assessment:   PT ADM ON 06/05/12 WITH URI, COPD EXACERBATION.  PTA, PT LIVES ALONE AND IS INDEPENDENT.     Action/Plan:   MET WITH PT TO DISCUSS DC PLANS.  PT MAY NEED HOME O2 AT DC, AS HE CONT TO DESATURATE ON ROOM AIR. WOULD RECOMMEND P.T. CONSULT.   Anticipated DC Date:  06/07/2012   Anticipated DC Plan:  HOME W HOME HEALTH SERVICES      DC Planning Services  CM consult      [facility] Choice  HOME HEALTH   Choice offered to / List presented to:  C-1 Patient   DME arranged  OXYGEN      DME agency  [facility]     [facility] arranged  HH-1 RN  HH-10 DISEASE MANAGEMENT      HH agency  [facility]   Status of service:  Completed, signed off Medicare Important Message given?   (If response is "NO", the following Medicare IM given date fields will be blank) Date Medicare IM given:   Date Additional Medicare IM given:    Discharge Disposition:  HOME W HOME HEALTH SERVICES  Per UR Regulation:  Reviewed for med. necessity/level of care/duration of stay  If discussed at Long Length of Stay Meetings, dates discussed:    Comments:  06/10/12 Oluchi Pucci,RN,BSN [phone] PT FOR DC TODAY.  NOTIFIED [facility] OF DC HOME.  PORTABLE O2 TANK IN ROOM.  Elliot Cousin, RN Case Manager Signed CASE MANAGEMENT Progress Notes 06/09/2012 3:28 PM Updated oxygen order received. HH orders and pt requesting [facility]. [facility] contact info added to dc instructions. Waiting dc date. Isidoro Donning RN CCM Case Mgmt phone [phone]   06/07/12 Ainsley Sanguinetti,RN,BSN [phone] PT AGREEABLE TO HH FOLLOW UP.  REFERRAL TO [facility] FOR HH FOLLOW UP AT DC, PER PT CHOICE.  START OF CARE 24-48H POST DC DATE.  WILL NEED HOME O2 AT DC.  06/06/12 Jazell Rosenau,RN,BSN [phone] PLEASE  NOTIFY CASE MANAGER SHOULD HOME O2 BE NEEDED AT DC. THANKS.

## 2012-06-07 ENCOUNTER — Inpatient Hospital Stay (HOSPITAL_COMMUNITY): Payer: BC Managed Care – PPO

## 2012-06-07 DIAGNOSIS — J438 Other emphysema: Secondary | ICD-10-CM

## 2012-06-07 DIAGNOSIS — R0902 Hypoxemia: Secondary | ICD-10-CM

## 2012-06-07 DIAGNOSIS — J441 Chronic obstructive pulmonary disease with (acute) exacerbation: Principal | ICD-10-CM

## 2012-06-07 DIAGNOSIS — F17200 Nicotine dependence, unspecified, uncomplicated: Secondary | ICD-10-CM

## 2012-06-07 DIAGNOSIS — J439 Emphysema, unspecified: Secondary | ICD-10-CM

## 2012-06-07 HISTORY — DX: Emphysema, unspecified: J43.9

## 2012-06-07 HISTORY — DX: Chronic obstructive pulmonary disease with (acute) exacerbation: J44.1

## 2012-06-07 HISTORY — DX: Hypoxemia: R09.02

## 2012-06-07 MED ORDER — TIOTROPIUM BROMIDE MONOHYDRATE 18 MCG IN CAPS
18.0000 ug | ORAL_CAPSULE | Freq: Every day | RESPIRATORY_TRACT | Status: DC
  Administered 2012-06-08 – 2012-06-10 (×3): 18 ug via RESPIRATORY_TRACT
  Filled 2012-06-07: qty 5

## 2012-06-07 MED ORDER — METHYLPREDNISOLONE SODIUM SUCC 125 MG IJ SOLR
80.0000 mg | Freq: Two times a day (BID) | INTRAMUSCULAR | Status: DC
  Administered 2012-06-08 – 2012-06-10 (×6): 80 mg via INTRAVENOUS
  Filled 2012-06-07 (×7): qty 1.28

## 2012-06-07 MED ORDER — METHYLPREDNISOLONE SODIUM SUCC 125 MG IJ SOLR
125.0000 mg | Freq: Four times a day (QID) | INTRAMUSCULAR | Status: DC
  Administered 2012-06-07: 125 mg via INTRAVENOUS
  Filled 2012-06-07 (×4): qty 2

## 2012-06-07 MED ORDER — ALBUTEROL SULFATE (5 MG/ML) 0.5% IN NEBU
2.5000 mg | INHALATION_SOLUTION | Freq: Four times a day (QID) | RESPIRATORY_TRACT | Status: DC
  Administered 2012-06-07 – 2012-06-10 (×11): 2.5 mg via RESPIRATORY_TRACT
  Filled 2012-06-07 (×11): qty 0.5

## 2012-06-07 MED ORDER — LEVOFLOXACIN 750 MG PO TABS
750.0000 mg | ORAL_TABLET | Freq: Every day | ORAL | Status: DC
  Administered 2012-06-07 – 2012-06-10 (×4): 750 mg via ORAL
  Filled 2012-06-07 (×4): qty 1

## 2012-06-07 MED ORDER — LORAZEPAM 0.5 MG PO TABS
0.5000 mg | ORAL_TABLET | Freq: Once | ORAL | Status: AC
  Administered 2012-06-07: 0.5 mg via ORAL

## 2012-06-07 MED ORDER — ALBUTEROL SULFATE (5 MG/ML) 0.5% IN NEBU
2.5000 mg | INHALATION_SOLUTION | RESPIRATORY_TRACT | Status: DC | PRN
  Administered 2012-06-08: 2.5 mg via RESPIRATORY_TRACT
  Filled 2012-06-07 (×2): qty 0.5

## 2012-06-07 NOTE — Progress Notes (Signed)
Internal Medicine Attending  Date: 06/07/2012  Patient name: Frank [name] Medical record number: [id number] Date of birth: October 15, 1955 Age: 58 y.o. Gender: male  I saw and evaluated the patient and discussed his care on a.m. rounds with house staff. His breathing is more labored today; lung exam shows mild expiratory wheezing and decreased air movement.  His O2 saturations are in the 88-91% range on 2-2.5 L per minute nasal cannula O2.  A chest x-ray today shows stable COPD/emphysema, with no superimposed acute process.  The 2-D echocardiogram showed normal left ventricular systolic function, PA peak pressure of 44mm Hg, and normal right ventricular cavity size, wall thickness, and systolic function.  One of two blood cultures is growing gram-positive cocci in pairs; the significance of this is unclear.  Patient has severe COPD and will need home oxygen; the plan today is to give IV steroids, continue inhaled bronchodilators, and add levofloxacin as empiric antibiotic with pulmonary coverage, and continue to reinforce the importance of smoking cessation.  Given the severity of his disease, would consult pulmonary as inpatient, which will also facilitate his establishing care and follow-up in the pulmonary clinic.  Dr. Daiva Eves will take over as attending physician on our service, and is on call this weekend as well.

## 2012-06-07 NOTE — Consult Note (Signed)
PULMONARY  / CRITICAL CARE MEDICINE  Name: Frank [name] MRN: [id number] DOB: 07-13-1955    ADMISSION DATE:  06/05/2012 CONSULTATION DATE:  1-31  REFERRING MD :  TS  CHIEF COMPLAINT:  SOB  BRIEF PATIENT DESCRIPTION:  57 yo life long 1 ppd smoker with increasing sob over 2 years with intermittent periods of purulent sputum. Admitted 1-29 after failed opt tx at urgent care.   SIGNIFICANT EVENTS / STUDIES:    LINES / TUBES:   CULTURES:  1-30 bc>> 1-30 uc>>neg ANTIBIOTICS: 1-30 levaquin>>  . HISTORY OF PRESENT ILLNESS:  57 yo life long 1 ppd smoker with increasing sob over 2 years with intermittent periods of purulent sputum. Admitted 1-29 after failed opt tx at urgent care. He noted green yellow sputum x 4 days, increasing SOB,walk <20 feet with SOB, ++ wheezes x 1 week. 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 On his best day can walk 50 feet. + orthopnea, no lower ext edema. PCCM asked to evaluate.   PAST MEDICAL HISTORY :  History reviewed. No pertinent past medical history. Past Surgical History  Procedure Date  . Cataract extraction, bilateral    Prior to Admission medications   Medication Sig Start Date End Date Taking? Authorizing Provider  albuterol (ACCUNEB) 0.63 MG/3ML nebulizer solution Take 1 ampule by nebulization every 6 (six) hours as needed. For cough/wheezing   Yes Historical Provider, MD  albuterol (PROVENTIL HFA;VENTOLIN HFA) 108 (90 BASE) MCG/ACT inhaler Inhale 1-2 puffs into the lungs every 6 (six) hours as needed. For wheezing   Yes Historical Provider, MD  Fluticasone-Salmeterol (ADVAIR) 500-50 MCG/DOSE AEPB Inhale 1 puff into the lungs every 12 (twelve) hours.   Yes Historical Provider, MD  Hydrocod Polst-Chlorphen Polst (TUSSICAPS) 10-8 MG CP12 Take 1 capsule by mouth every 12 (twelve) hours.   Yes Historical Provider, MD   No Known Allergies  FAMILY HISTORY:  Both parents deceased with heart and pulmonary dz. SOCIAL  HISTORY:  reports that he has been smoking Cigarettes.  He has been smoking about 1 pack per day. He does not have any smokeless tobacco history on file. He reports that he does not drink alcohol or use illicit drugs. 1 ppd since age 19  REVIEW OF SYSTEMS:   Taken see hpi  SUBJECTIVE:   VITAL SIGNS: Temp:  [97.6 F (36.4 C)-98.4 F (36.9 C)] 97.6 F (36.4 C) (01/31 0609) Pulse Rate:  [85-100] 86  (01/31 0609) Resp:  [20-22] 20  (01/31 0609) BP: (131-135)/(76-86) 135/76 mmHg (01/31 0609) SpO2:  [88 %-96 %] 91 % (01/31 1259)  PHYSICAL EXAMINATION: General:  WNWDWM NAD at rest Neuro:  Intact HEENT:  No LAN, poor dentation Neck:  No jvd Cardiovascular:  HSR RRR Lungs:  +wheezes thru out Abdomen:  +bs Musculoskeletal:  Intact Skin:  Warm , no edema   Lab 06/06/12 0640 06/05/12 1128  NA 135 136  K 4.3 4.0  CL 97 96  CO2 26 27  BUN 16 18  CREATININE 0.76 0.80  GLUCOSE 117* 100*    Lab 06/06/12 0640 06/05/12 1128  HGB 15.3 16.3  HCT 45.4 47.8  WBC 8.9 8.4  PLT 203 209   Dg Chest 2 View  06/07/2012  *RADIOLOGY REPORT*  Clinical Data: Hypoxemia, COPD, shortness of breath  CHEST - 2 VIEW  Comparison: 06/05/2012  Findings: Background emphysema noted with hyperinflation.  Normal heart size and vascularity.  No focal pneumonia, collapse, consolidation, edema, effusion or pneumothorax.  Trachea is midline.  Stable  exam.  IMPRESSION: Stable COPD/emphysema.  No superimposed acute process   Original Report Authenticated By: Judie Petit. Shick, M.D.   1-30 2 d Left ventricle: The cavity size was normal. Systolic function was normal. The estimated ejection fraction was in the range of 55% to 60%   ASSESSMENT / PLAN: Acute exacerbation of chronic obstructive pulmonary disease with hypoxia.  Plan: -agree with current tx - On advair as opt. -consider change BD to Q6h and Q3h prn -O2 as needed, may now be O2 dependent as opt. - Check Alpha 1  Antitrypsin 1 , copd out of proportion in 58  yo 1 ppd smoker  -Follow up with pulmonary as opt.    Brett Canales Minor ACNP [facility] PCCM Pager [phone] till 3 pm If no answer page [phone] 06/07/2012, 1:36 PM    PCCM Attending:  IMPRESSION: Emphysema by CXR (hyperinflation) AECOPD with mild persistent wheezing Hypoxemia - ? Chronicity Smoker  PLAN/REC: 1) Methylpred dose adjusted  Complete 7-10 days steroids 2) Agree with Levofloxacin  Complete 5-7 days 3) OK for discharge when he can sufficiently perform necessary ADLs 4) might require home O2 - ? short term while still recovering vs long term ?  Home O2 for resting SpO2 < 90% or ambulatroy SpO2 < 88% 5) Smoking cessation  Counseled re strategies and possible pitfalls 6) Discharge meds should be a controlled med - either Spiriva or Advair or equivalent - and a rescue MDI (albuterol) 7) I have scheduled F/UU with Dr Delton Coombes 2/14 @ 3:30 PM   Will see again Monday if he is still here. Please call sooner PRN.   Billy Fischer, MD ; [facility] [phone].  After 5:30 PM or weekends, call [phone]

## 2012-06-07 NOTE — Progress Notes (Signed)
Medical Student Daily Progress Note  Subjective: Patient reports his breathing has worsened overnight.  Although he felt it had initially been improving in the hospital, he now endorses having more difficutly catching his breath, despite his supplemental oxygen.  He also reports continued nasal congestion, but states that his head fullness is improved.  He denies any chest pain, fevers, chills, or nausea/vomiting.   Objective: Vital signs in last 24 hours: Filed Vitals:   06/07/12 0418 06/07/12 0419 06/07/12 0609 06/07/12 0805  BP:   135/76   Pulse:  85 86   Temp:   97.6 F (36.4 C)   TempSrc:   Oral   Resp:  20 20   Height:      Weight:      SpO2: 90%  95% 88%   Weight change:   Intake/Output Summary (Last 24 hours) at 06/07/12 1257 Last data filed at 06/07/12 0700  Gross per 24 hour  Intake    240 ml  Output      0 ml  Net    240 ml   Physical Exam: Vitals reviewed. General: sitting up in bed, NAD HEENT: EOMI, no scleral icterus, atraumatic, normocephalic Cardiac: RRR, no rubs, murmurs or gallops Pulm: diminished breath sounds throughout all lung fields, faint expiratory wheezes appreciated, increased work of breathing Abd: soft, nontender, nondistended, BS present Ext: warm and well perfused, no pedal edema Neuro: alert and oriented X3, cranial nerves II-XII grossly intact, strength and sensation to light touch equal in bilateral upper and lower extremities  Lab Results: Basic Metabolic Panel:  Lab 06/06/12 3086 06/05/12 1128  Nicle Connole 135 136  K 4.3 4.0  CL 97 96  CO2 26 27  GLUCOSE 117* 100*  BUN 16 18  CREATININE 0.76 0.80  CALCIUM 9.1 9.1  MG -- --  PHOS -- --   CBC:  Lab 06/06/12 0640 06/05/12 1128  WBC 8.9 8.4  NEUTROABS -- 5.4  HGB 15.3 16.3  HCT 45.4 47.8  MCV 85.5 85.5  PLT 203 209   Cardiac Enzymes:  Lab 06/06/12 1159 06/06/12 0640 06/06/12 0040  CKTOTAL -- -- --  CKMB -- -- --  CKMBINDEX -- -- --  TROPONINI <0.30 <0.30 <0.30    BNP:  Lab 06/06/12 0040  PROBNP 18.0   D-Dimer:  Lab 06/06/12 0845  DDIMER <0.27   Urinalysis:  Lab 06/05/12 1236  COLORURINE YELLOW  LABSPEC 1.023  PHURINE 6.5  GLUCOSEU NEGATIVE  HGBUR NEGATIVE  BILIRUBINUR NEGATIVE  KETONESUR 15*  PROTEINUR NEGATIVE  UROBILINOGEN 2.0*  NITRITE NEGATIVE  LEUKOCYTESUR NEGATIVE    Micro Results: Recent Results (from the past 240 hour(s))  URINE CULTURE     Status: Normal   Collection Time   06/05/12 12:36 PM      Component Value Range Status Comment   Specimen Description URINE, CLEAN CATCH   Final    Special Requests NONE   Final    Culture  Setup Time 06/05/2012 21:25   Final    Colony Count NO GROWTH   Final    Culture NO GROWTH   Final    Report Status 06/06/2012 FINAL   Final   CULTURE, BLOOD (ROUTINE X 2)     Status: Normal (Preliminary result)   Collection Time   06/05/12  1:00 PM      Component Value Range Status Comment   Specimen Description BLOOD ARM LEFT   Final    Special Requests BOTTLES DRAWN AEROBIC AND ANAEROBIC 10CC   Final  Culture  Setup Time 06/05/2012 18:58   Final    Culture     Final    Value: GRAM POSITIVE COCCI IN PAIRS        BLOOD CULTURE RECEIVED NO GROWTH TO DATE CULTURE WILL BE HELD FOR 5 DAYS BEFORE ISSUING A FINAL NEGATIVE REPORT     Note: Gram Stain Report Called to,Read Back By and Verified With: OLUFUNKE IDOWU ON 06/06/2012 AT 9:13P BY WILEJ   Report Status PENDING   Incomplete   CULTURE, BLOOD (ROUTINE X 2)     Status: Normal (Preliminary result)   Collection Time   06/05/12  1:00 PM      Component Value Range Status Comment   Specimen Description BLOOD RIGHT HAND   Final    Special Requests BOTTLES DRAWN AEROBIC AND ANAEROBIC 10CC   Final    Culture  Setup Time 06/05/2012 18:58   Final    Culture     Final    Value:        BLOOD CULTURE RECEIVED NO GROWTH TO DATE CULTURE WILL BE HELD FOR 5 DAYS BEFORE ISSUING A FINAL NEGATIVE REPORT   Report Status PENDING   Incomplete     Studies/Results: Dg Chest 2 View  06/07/2012  *RADIOLOGY REPORT*  Clinical Data: Hypoxemia, COPD, shortness of breath  CHEST - 2 VIEW  Comparison: 06/05/2012  Findings: Background emphysema noted with hyperinflation.  Normal heart size and vascularity.  No focal pneumonia, collapse, consolidation, edema, effusion or pneumothorax.  Trachea is midline.  Stable exam.  IMPRESSION: Stable COPD/emphysema.  No superimposed acute process   Original Report Authenticated By: Judie Petit. Miles Costain, M.D.    Medications: I have reviewed the patient's current medications. Scheduled Meds:   . ipratropium  0.5 mg Nebulization Q4H   And  . albuterol  2.5 mg Nebulization Q4H  . enoxaparin (LOVENOX) injection  40 mg Subcutaneous Q24H  . levofloxacin  750 mg Oral Daily  . LORazepam  0.5 mg Oral Once  . methylPREDNISolone (SOLU-MEDROL) injection  125 mg Intravenous Q6H  . nicotine  21 mg Transdermal Daily  . sodium chloride  3 mL Intravenous Q12H   Continuous Infusions:  PRN Meds:.sodium chloride, sodium chloride Assessment/Plan: 57 yo M admitted for hypoxemia in setting of presumed COPD, exacerbated possibly by recent URI, now with worsening SOB over past 24 hours.  1. COPD in setting of Upper Respiratory infection with recent exacerbation: Patient treated with bronchodilators, steroids, and short course of azithromycin at Urgent Care prior to admission with some symptomatic improvement.  Admitted for continued SOB and reported O2 saturation down to 84% at home.  Patient now being treated with supplemental O2, nebulized ipratropium-albuterol, and solumedrol, but has increased SOB overnight and decreased air movement today as compared to previous exams.  Repeat CXR shows stable COPD with no acute processes.  He continues to be afebrile, without leukocytosis, and with improvement in his cough.  Of note, patient's O2 sats on RA dropped to 88% while sitting and to 78% with ambulation on 1/30.   - continue nebulized  ipratropium-albuterol q4h  - increase steroids to solumedrol 125mg  IV q6h - begin levo 750mg  po qDay (started 06/07/12)  - continue supplemental oxygen - inpatient PT/OT consul - Pulm consulted, will need continued follow-up with Pulm as outpatient - set up home O2 - gram + cocci in pairs identified in 1/2 blood cultures, likely contaminant; NGTD with other cultures - will continue to follow-up - TTE echo unremarkable  - influenza panel  negative  - pro-BNP normal  - cardiac enzymes negative x3  - d-dimer normal  - outpatient PFTs after resolution of acute illness  - consider consult for home health to improve compliance with COPD regimen - will consider starting pt on Spiriva at time of discharge as patient was not previously using an anticholinergic  2. Smoking Cessation: Patient counseled on the risks of continuing smoking, especially in the setting of home O2. He reports the nicotine patch while inpatient has helped curb his craving. He also reports his sister is sending him some electronic cigarettes in the next few weeks, which he is interested in trying.  - nicotine patch  - further management in OPC   3.  DVT PPX: lonenox sq    LOS: 2 days   This is a Psychologist, occupational Note.  The care of the patient was discussed with Dr. Dierdre Searles and the assessment and plan formulated with their assistance.  Please see their attached note for official documentation of the daily encounter.  Sabino Dick T 06/07/2012, 12:57 PM   Dispo: Disposition is deferred at this time, awaiting improvement of current medical problems.  Anticipated discharge in approximately 1-2 day(s).   The patient does not have a current PCP (Default, Provider, MD), therefore will be requiring OPC follow-up after discharge.   The patient does not have transportation limitations that hinder transportation to clinic appointments.  .Services Needed at time of discharge: Y = Yes, Blank = No PT:   OT:   RN:   Equipment:    Other:    Resident Co-sign Daily Note: I have seen the patient and reviewed the daily progress note by MS 4 Sabino Dick and discussed the care of the patient with them.  See below for documentation of my findings, assessment, and plans.  Subjective: Patient reports worsening of SOB and wheezing. Desat to 88% on 2.5 L ( was 95% on 2.5 L) Objective: Vital signs in last 24 hours: Filed Vitals:   06/07/12 1823 06/07/12 2048 06/08/12 0440 06/08/12 0913  BP:  128/76 153/83   Pulse:  106 97   Temp:  97.5 F (36.4 C) 98.2 F (36.8 C)   TempSrc:  Oral Oral   Resp:  28 28   Height:      Weight:   159 lb 8 oz (72.349 kg)   SpO2: 95% 94% 95% 96%   Physical Exam: General:mild distress due to SOB Lungs: minimum air exchange bilaterally. Faint end-expiratory wheezing. No rales.  Heart: normal rate, regular rhythm, no murmur, no gallop, and no rub.  Abdomen: soft, non-tender, normal bowel sounds, no distention, no guarding, no rebound tenderness, no hepatomegaly, and no splenomegaly.  Extremities: No cyanosis, clubbing, edema Neurologic: alert & oriented X3, cranial nerves II-XII intact, strength normal in all extremities, sensation intact to light touch, and gait normal.     Lab Results: Reviewed and documented in Electronic Record Micro Results: Reviewed and documented in Electronic Record Studies/Results: Reviewed and documented in Electronic Record Medications: I have reviewed the patient's current medications. Scheduled Meds:   . albuterol  2.5 mg Nebulization Q6H  . enoxaparin (LOVENOX) injection  40 mg Subcutaneous Q24H  . levofloxacin  750 mg Oral Daily  . LORazepam  0.5 mg Oral Once  . methylPREDNISolone (SOLU-MEDROL) injection  80 mg Intravenous Q12H  . nicotine  21 mg Transdermal Daily  . sodium chloride  3 mL Intravenous Q12H  . tiotropium  18 mcg Inhalation Daily   Continuous Infusions:  PRN  Meds:.sodium chloride, albuterol, sodium chloride Assessment/Plan: 1.  COPD exacerbation Patient likely has moderate to severe untreated COPD. Will max his treatment. Slowly tapering off steroids. Will add Spiriva and home O2 upon discharge.   - O2 -BD Q4 - Solumedrol 125 q6  - Levaquin - Home health PT/OT - follow up with [facility] at 330pm on 06/21/12.   2. Tobacco  - education -nicotine patch.   Shoua Ressler 06/08/2012, 12:33 PM

## 2012-06-07 NOTE — Evaluation (Signed)
Physical Therapy Evaluation Patient Details Name: Frank [name] MRN: [id number] DOB: [birth year] Today's Date: 06/07/2012 Time: 7829-5621 PT Time Calculation (min): 11 min  PT Assessment / Plan / Recommendation Clinical Impression  Pt admitted for COPD with hypoxia: recent exacerbation secondary to Upper Respiratory Infection.  Pt ambulated in hallway on room air however SaO2 dropped to 83% so reapplied oxygen and educated pt in pursed lip breathing.  Pt with DOE and only able to tolerate short distance.  Pt would benefit from acute PT services in order to improve independence with ambulation and increase activity tolerance while monitoring vitals to prepare for d/c home. (See below for saturation qualifications)    PT Assessment  Patient needs continued PT services    Follow Up Recommendations  Home health PT    Does the patient have the potential to tolerate intense rehabilitation      Barriers to Discharge        Equipment Recommendations  None recommended by PT    Recommendations for Other Services     Frequency Min 3X/week    Precautions / Restrictions Precautions Precaution Comments: monitor sats   Pertinent Vitals/Pain SATURATION QUALIFICATIONS: (This note is used to comply with regulatory documentation for home oxygen)  Patient Saturations on Room Air at Rest = 93%  Patient Saturations on Room Air while Ambulating = 83%  Patient Saturations on 4 Liters of oxygen while Ambulating = 90%  Please briefly explain why patient needs home oxygen: pt with decreased saturations during physical activity (walking) requiring oxygen to have saturations above 88%.  (attempted 3L O2 [place] first however pt only 86% with pursed lip breathing so increased to 4L)     Mobility  Bed Mobility Bed Mobility: Supine to Sit Supine to Sit: 7: Independent Transfers Transfers: Sit to Stand;Stand to Sit Sit to Stand: 5: Supervision;From bed Stand to Sit: 5: Supervision;To  chair/3-in-1 Ambulation/Gait Ambulation/Gait Assistance: 5: Supervision Ambulation Distance (Feet): 40 Feet (x2) Assistive device: None Ambulation/Gait Assistance Details: pt ambulated on room air to start however SaO2 dropped to 83% so reapplied 3L O2 [place] and performed pursed lip breathing and increase to 86% so increased to 4L and pt able to bring SaO2 to 90% and return to room Gait Pattern: Step-through pattern Gait velocity: decreased General Gait Details: dyspnea 3/4    Shoulder Instructions     Exercises     PT Diagnosis: Difficulty walking  PT Problem List: Decreased activity tolerance;Cardiopulmonary status limiting activity;Decreased mobility PT Treatment Interventions: DME instruction;Gait training;Stair training;Functional mobility training;Patient/family education;Therapeutic exercise   PT Goals Acute Rehab PT Goals PT Goal Formulation: With patient Time For Goal Achievement: 06/14/12 Potential to Achieve Goals: Good Pt will Ambulate: >150 feet;with modified independence;Other (comment) (SaO2 >92%) PT Goal: Ambulate - Progress: Goal set today Pt will Go Up / Down Stairs: 3-5 stairs;with supervision PT Goal: Up/Down Stairs - Progress: Goal set today Pt will Perform Home Exercise Program: with supervision, verbal cues required/provided PT Goal: Perform Home Exercise Program - Progress: Goal set today  Visit Information  Last PT Received On: 06/07/12 Assistance Needed: +1    Subjective Data  Subjective: I could do whatever I wanted up until I started feeling sick.   Prior Functioning  Home Living Lives With: Spouse Type of Home: House Home Access: Stairs to enter Home Layout: One level Home Adaptive Equipment: None Prior Function Level of Independence: Independent Comments: Pt reports not previously on oxygen Communication Communication: No difficulties    Cognition  Overall Cognitive Status:  Appears within functional limits for tasks  assessed/performed Arousal/Alertness: Awake/alert Orientation Level: Appears intact for tasks assessed Behavior During Session: [facility] for tasks performed    Extremity/Trunk Assessment Right Upper Extremity Assessment RUE ROM/Strength/Tone: [facility] for tasks assessed Left Upper Extremity Assessment LUE ROM/Strength/Tone: [facility] for tasks assessed Right Lower Extremity Assessment RLE ROM/Strength/Tone: [facility] for tasks assessed Left Lower Extremity Assessment LLE ROM/Strength/Tone: [facility] for tasks assessed   Balance    End of Session PT - End of Session Equipment Utilized During Treatment: Oxygen Activity Tolerance: Other (comment) (DOE) Patient left: in chair;with family/visitor present  GP     Larri Yehle,KATHrine E 06/07/2012, 3:54 PM  Zenovia Jarred, PT, DPT 06/07/2012 Pager: [phone]

## 2012-06-08 LAB — BASIC METABOLIC PANEL
BUN: 22 mg/dL (ref 6–23)
Calcium: 9.7 mg/dL (ref 8.4–10.5)
Chloride: 97 mEq/L (ref 96–112)
Creatinine, Ser: 0.78 mg/dL (ref 0.50–1.35)
GFR calc Af Amer: >90 mL/min (ref >90)
GFR calc non Af Amer: >90 mL/min (ref >90)

## 2012-06-08 LAB — GLUCOSE, CAPILLARY: Glucose-Capillary: 117 mg/dL — ABNORMAL HIGH (ref 70–99)

## 2012-06-08 NOTE — Progress Notes (Addendum)
Subjective: Feels better. Less SOB and wheezing. Comfortable resting on the bed. No acute event overnight.   Objective: Vital signs in last 24 hours: Filed Vitals:   06/07/12 1823 06/07/12 2048 06/08/12 0440 06/08/12 0913  BP:  128/76 153/83   Pulse:  106 97   Temp:  97.5 F (36.4 C) 98.2 F (36.8 C)   TempSrc:  Oral Oral   Resp:  28 28   Height:      Weight:   159 lb 8 oz (72.349 kg)   SpO2: 95% 94% 95% 96%   Weight change:   Intake/Output Summary (Last 24 hours) at 06/08/12 1245 Last data filed at 06/07/12 1700  Gross per 24 hour  Intake    240 ml  Output      0 ml  Net    240 ml   General: alert, well-developed, and cooperative to examination.  Lungs: better air exchanges. End expiratory wheezing noted ant. And post. No rales.  Heart: normal rate, regular rhythm, no murmur, no gallop, and no rub.  Abdomen: soft, non-tender, normal bowel sounds, no distention, no guarding, no rebound tenderness, no hepatomegaly, and no splenomegaly.  Msk: no joint swelling, no joint warmth, and no redness over joints.  Pulses: 2+ DP/PT pulses bilaterally Extremities: No cyanosis, clubbing, edema Neurologic: alert & oriented X3, cranial nerves II-XII intact, strength normal in all extremities, sensation intact to light touch, and gait normal.     Lab Results: Basic Metabolic Panel:  Lab 06/06/12 4098 06/05/12 1128  Susana Duell 135 136  K 4.3 4.0  CL 97 96  CO2 26 27  GLUCOSE 117* 100*  BUN 16 18  CREATININE 0.76 0.80  CALCIUM 9.1 9.1  MG -- --  PHOS -- --   CBC:  Lab 06/06/12 0640 06/05/12 1128  WBC 8.9 8.4  NEUTROABS -- 5.4  HGB 15.3 16.3  HCT 45.4 47.8  MCV 85.5 85.5  PLT 203 209   Cardiac Enzymes:  Lab 06/06/12 1159 06/06/12 0640 06/06/12 0040  CKTOTAL -- -- --  CKMB -- -- --  CKMBINDEX -- -- --  TROPONINI <0.30 <0.30 <0.30   BNP:  Lab 06/06/12 0040  PROBNP 18.0   D-Dimer:  Lab 06/06/12 0845  DDIMER <0.27   Urinalysis:  Lab 06/05/12 1236  COLORURINE  YELLOW  LABSPEC 1.023  PHURINE 6.5  GLUCOSEU NEGATIVE  HGBUR NEGATIVE  BILIRUBINUR NEGATIVE  KETONESUR 15*  PROTEINUR NEGATIVE  UROBILINOGEN 2.0*  NITRITE NEGATIVE  LEUKOCYTESUR NEGATIVE    Micro Results: Recent Results (from the past 240 hour(s))  URINE CULTURE     Status: Normal   Collection Time   06/05/12 12:36 PM      Component Value Range Status Comment   Specimen Description URINE, CLEAN CATCH   Final    Special Requests NONE   Final    Culture  Setup Time 06/05/2012 21:25   Final    Colony Count NO GROWTH   Final    Culture NO GROWTH   Final    Report Status 06/06/2012 FINAL   Final   CULTURE, BLOOD (ROUTINE X 2)     Status: Normal (Preliminary result)   Collection Time   06/05/12  1:00 PM      Component Value Range Status Comment   Specimen Description BLOOD ARM LEFT   Final    Special Requests BOTTLES DRAWN AEROBIC AND ANAEROBIC 10CC   Final    Culture  Setup Time 06/05/2012 18:58   Final  Culture     Final    Value: GRAM POSITIVE COCCI IN PAIRS        BLOOD CULTURE RECEIVED NO GROWTH TO DATE CULTURE WILL BE HELD FOR 5 DAYS BEFORE ISSUING A FINAL NEGATIVE REPORT     Note: Gram Stain Report Called to,Read Back By and Verified With: OLUFUNKE IDOWU ON 06/06/2012 AT 9:13P BY WILEJ   Report Status PENDING   Incomplete   CULTURE, BLOOD (ROUTINE X 2)     Status: Normal (Preliminary result)   Collection Time   06/05/12  1:00 PM      Component Value Range Status Comment   Specimen Description BLOOD RIGHT HAND   Final    Special Requests BOTTLES DRAWN AEROBIC AND ANAEROBIC 10CC   Final    Culture  Setup Time 06/05/2012 18:58   Final    Culture     Final    Value:        BLOOD CULTURE RECEIVED NO GROWTH TO DATE CULTURE WILL BE HELD FOR 5 DAYS BEFORE ISSUING A FINAL NEGATIVE REPORT   Report Status PENDING   Incomplete    Studies/Results: Dg Chest 2 View  06/07/2012  *RADIOLOGY REPORT*  Clinical Data: Hypoxemia, COPD, shortness of breath  CHEST - 2 VIEW  Comparison:  06/05/2012  Findings: Background emphysema noted with hyperinflation.  Normal heart size and vascularity.  No focal pneumonia, collapse, consolidation, edema, effusion or pneumothorax.  Trachea is midline.  Stable exam.  IMPRESSION: Stable COPD/emphysema.  No superimposed acute process   Original Report Authenticated By: Judie Petit. Miles Costain, M.D.    Medications: I have reviewed the patient's current medications. Scheduled Meds:   . albuterol  2.5 mg Nebulization Q6H  . enoxaparin (LOVENOX) injection  40 mg Subcutaneous Q24H  . levofloxacin  750 mg Oral Daily  . LORazepam  0.5 mg Oral Once  . methylPREDNISolone (SOLU-MEDROL) injection  80 mg Intravenous Q12H  . nicotine  21 mg Transdermal Daily  . sodium chloride  3 mL Intravenous Q12H  . tiotropium  18 mcg Inhalation Daily   Continuous Infusions:  PRN Meds:.sodium chloride, albuterol, sodium chloride Assessment/Plan: 1. COPD exacerbation, better Patient likely has moderate to severe untreated COPD. Will max his treatment. Slowly tapering off steroids. Will need home O2 upon discharge. Respond to treatment.  Repeat CXR unchanged.  - O2  - Bld Cx 1/2 GPC, likely contaminated.  - BD , spiriva added.  - Solumedrol 80 q12  - Levaquin  - Home health PT - OT pending - follow up with [facility] at 330pm on 06/21/12.   2. Tobacco  - education  -nicotine patch.     Dispo: Disposition is deferred at this time, awaiting improvement of current medical problems.  Anticipated discharge in approximately 2 day(s).   The patient does not have a current PCP (Default, Provider, MD), therefore will be requiring OPC follow-up after discharge.   The patient does not have transportation limitations that hinder transportation to clinic appointments.  .Services Needed at time of discharge: Y = Yes, Blank = No PT: Y  OT: pending  RN: Y  Equipment: Y Neb  Other: Home O2 and pulmonary follow up    LOS: 3 days   Keylen Uzelac 06/08/2012, 12:45 PM

## 2012-06-08 NOTE — Progress Notes (Signed)
Internal Medicine Teaching Service Attending Note Date: 06/08/2012  Patient name: Frank [name]  Medical record number: [id number]  Date of birth: 06-03-1955    This patient has been seen and discussed with the house staff. Please see their note for complete details. I concur with their findings with the following additions/corrections:  Patient still with diffuse wheezes on exam although he feels symptomatically better than yesterday. We'll continue IV Solu-Medrol antibiotics nebulizers and oxygen. He may indeed need home oxygen at discharge.  Paulette Blanch Frank [name] 06/08/2012, 11:14 AM

## 2012-06-09 NOTE — Progress Notes (Signed)
Subjective: Patient states that he continues to feel better than yesterday.  Minimal shortness breath at rest.  Still shortness breath with ambulation to the bathroom.  No acute events overnight.  Wife at bedside and all questions answered. Objective: Vital signs in last 24 hours: Filed Vitals:   06/09/12 0300 06/09/12 0948 06/09/12 1441 06/09/12 1500  BP: 127/74   138/75  Pulse: 89   102  Temp: 97.7 F (36.5 C)   97.5 F (36.4 C)  TempSrc: Oral   Oral  Resp: 22   20  Height:      Weight: 161 lb 9.6 oz (73.301 kg)     SpO2: 98% 90% 93% 94%   Weight change: 2 lb 1.6 oz (0.953 kg)  Intake/Output Summary (Last 24 hours) at 06/09/12 1801 Last data filed at 06/09/12 1500  Gross per 24 hour  Intake    600 ml  Output      1 ml  Net    599 ml   General: alert, well-developed, and cooperative to examination.  Lungs: better air exchanges.  Less End expiratory wheezing noted ant. And post. No rales.  Heart: normal rate, regular rhythm, no murmur, no gallop, and no rub.  Abdomen: soft, non-tender, normal bowel sounds, no distention, no guarding, no rebound tenderness, no hepatomegaly, and no splenomegaly.  Msk: no joint swelling, no joint warmth, and no redness over joints.  Pulses: 2+ DP/PT pulses bilaterally Extremities: No cyanosis, clubbing, edema Neurologic: alert & oriented X3, cranial nerves II-XII intact, strength normal in all extremities, sensation intact to light touch, and gait normal.      Lab Results: Basic Metabolic Panel:  Lab 06/08/12 1610 06/06/12 0640  Adiel Mcnamara 135 135  K 4.1 4.3  CL 97 97  CO2 28 26  GLUCOSE 170* 117*  BUN 22 16  CREATININE 0.78 0.76  CALCIUM 9.7 9.1  MG -- --  PHOS -- --   CBC:  Lab 06/06/12 0640 06/05/12 1128  WBC 8.9 8.4  NEUTROABS -- 5.4  HGB 15.3 16.3  HCT 45.4 47.8  MCV 85.5 85.5  PLT 203 209   Cardiac Enzymes:  Lab 06/06/12 1159 06/06/12 0640 06/06/12 0040  CKTOTAL -- -- --  CKMB -- -- --  CKMBINDEX -- -- --  TROPONINI  <0.30 <0.30 <0.30   BNP:  Lab 06/06/12 0040  PROBNP 18.0   D-Dimer:  Lab 06/06/12 0845  DDIMER <0.27   CBG:  Lab 06/09/12 0607 06/08/12 2053 06/08/12 1634  GLUCAP 134* 157* 117*   Urinalysis:  Lab 06/05/12 1236  COLORURINE YELLOW  LABSPEC 1.023  PHURINE 6.5  GLUCOSEU NEGATIVE  HGBUR NEGATIVE  BILIRUBINUR NEGATIVE  KETONESUR 15*  PROTEINUR NEGATIVE  UROBILINOGEN 2.0*  NITRITE NEGATIVE  LEUKOCYTESUR NEGATIVE    Micro Results: Recent Results (from the past 240 hour(s))  URINE CULTURE     Status: Normal   Collection Time   06/05/12 12:36 PM      Component Value Range Status Comment   Specimen Description URINE, CLEAN CATCH   Final    Special Requests NONE   Final    Culture  Setup Time 06/05/2012 21:25   Final    Colony Count NO GROWTH   Final    Culture NO GROWTH   Final    Report Status 06/06/2012 FINAL   Final   CULTURE, BLOOD (ROUTINE X 2)     Status: Normal (Preliminary result)   Collection Time   06/05/12  1:00 PM  Component Value Range Status Comment   Specimen Description BLOOD ARM LEFT   Final    Special Requests BOTTLES DRAWN AEROBIC AND ANAEROBIC 10CC   Final    Culture  Setup Time 06/05/2012 18:58   Final    Culture     Final    Value: GRAM POSITIVE COCCI IN PAIRS        BLOOD CULTURE RECEIVED NO GROWTH TO DATE CULTURE WILL BE HELD FOR 5 DAYS BEFORE ISSUING A FINAL NEGATIVE REPORT     Note: Gram Stain Report Called to,Read Back By and Verified With: OLUFUNKE IDOWU ON 06/06/2012 AT 9:13P BY WILEJ   Report Status PENDING   Incomplete   CULTURE, BLOOD (ROUTINE X 2)     Status: Normal (Preliminary result)   Collection Time   06/05/12  1:00 PM      Component Value Range Status Comment   Specimen Description BLOOD RIGHT HAND   Final    Special Requests BOTTLES DRAWN AEROBIC AND ANAEROBIC 10CC   Final    Culture  Setup Time 06/05/2012 18:58   Final    Culture     Final    Value:        BLOOD CULTURE RECEIVED NO GROWTH TO DATE CULTURE WILL BE HELD FOR 5  DAYS BEFORE ISSUING A FINAL NEGATIVE REPORT   Report Status PENDING   Incomplete    Studies/Results: No results found. Medications: I have reviewed the patient's current medications. Scheduled Meds:   . albuterol  2.5 mg Nebulization Q6H  . enoxaparin (LOVENOX) injection  40 mg Subcutaneous Q24H  . levofloxacin  750 mg Oral Daily  . LORazepam  0.5 mg Oral Once  . methylPREDNISolone (SOLU-MEDROL) injection  80 mg Intravenous Q12H  . nicotine  21 mg Transdermal Daily  . sodium chloride  3 mL Intravenous Q12H  . tiotropium  18 mcg Inhalation Daily   Continuous Infusions:  PRN Meds:.sodium chloride, albuterol, sodium chloride Assessment/Plan: 1. COPD exacerbation, better  moderate to severe untreated COPD. Repeat CXR unchanged. Slowly tapering off steroids. Will need home O2 upon discharge.   - O2  - Bld Cx 1/2 GPC, likely contaminated.  - Check Alpha 1 Antitrypsin 1 pending - BD , spiriva added.  - Solumedrol 80 q12  - Levaquin  - Home health PT  - OT pending  - follow up with [facility] at 330pm on 06/21/12.   2. Tobacco  - education  -nicotine patch.   Dispo:  - discharge on 06/10/12 if stable - Prednisone 60 mg daily x6 days, 40 mg daily x2 days, 30 mg daily x2 days, 20 mg daily x2 days, 10 mg daily x 2 days, then off -Continue home Advair, albuterol inhaler -I albuterol and Atrovent neb treatment every 6 hours when necessary upon discharge -Add new inhaler Spiriva - Levaquin 750 by mouth daily to complete a total of 7 days treatment. -OT eval pending - Case manager to set up home health PT, RN and home O2.     Home O2 for resting SpO2 < 90% or ambulatroy SpO2 < 88% -Case managers set up oxygen tank and nebulizer machine at home -Smoking cessation--prescribe nicotine patch - F/UU with Dr Delton Coombes 2/14 @ 3:30 PM  - Set up [facility] followup as well.   .Services Needed at time of discharge: Y = Yes, Blank = No PT:  yes   OT:   RN:  yes   Equipment:  yes oxygen tank and  nebulizer treatment  Other:     LOS: 4 days   Jalynn Waddell 06/09/2012, 6:01 PM

## 2012-06-09 NOTE — Progress Notes (Signed)
Pt ambulated around unit with NT on 4L [place]. Pt tolerated fairly well but was very SOB.

## 2012-06-09 NOTE — Progress Notes (Signed)
Resident Addendum to Medical Student Note   I have seen and examined the patient, and agree with the the medical student assessment and plan outlined above. Please see my separate brief note for additional details.   Kima Malenfant  06/09/2012, 7:31 PM

## 2012-06-09 NOTE — Progress Notes (Signed)
Pt ambulated approx. 200 feet on 4L [place]. Very SOB and used pursed lip breathing. Pt back to bed with call bell in reach.

## 2012-06-09 NOTE — Progress Notes (Signed)
Updated oxygen order received. HH orders and pt requesting [facility]. [facility] contact info added to dc instructions. Waiting dc date. Isidoro Donning RN CCM Case Mgmt phone [phone]

## 2012-06-10 LAB — CULTURE, BLOOD (ROUTINE X 2): Culture: NO GROWTH

## 2012-06-10 LAB — BASIC METABOLIC PANEL
BUN: 27 mg/dL — ABNORMAL HIGH (ref 6–23)
Chloride: 100 mEq/L (ref 96–112)
Creatinine, Ser: 0.77 mg/dL (ref 0.50–1.35)
Glucose, Bld: 141 mg/dL — ABNORMAL HIGH (ref 70–99)
Potassium: 4.2 mEq/L (ref 3.5–5.1)

## 2012-06-10 LAB — GLUCOSE, CAPILLARY: Glucose-Capillary: 119 mg/dL — ABNORMAL HIGH (ref 70–99)

## 2012-06-10 MED ORDER — LEVOFLOXACIN 750 MG PO TABS: 750.0000 mg | ORAL_TABLET | Freq: Every day | ORAL | Status: DC

## 2012-06-10 MED ORDER — NICOTINE 21 MG/24HR TD PT24: 1.0000 | MEDICATED_PATCH | Freq: Every day | TRANSDERMAL | Status: DC

## 2012-06-10 MED ORDER — IPRATROPIUM BROMIDE 0.02 % IN SOLN: 500.0000 ug | Freq: Four times a day (QID) | RESPIRATORY_TRACT | Status: DC | PRN

## 2012-06-10 MED ORDER — TIOTROPIUM BROMIDE MONOHYDRATE 18 MCG IN CAPS: 18.0000 ug | ORAL_CAPSULE | Freq: Every day | RESPIRATORY_TRACT | Status: DC

## 2012-06-10 MED ORDER — PREDNISONE 20 MG PO TABS: ORAL_TABLET | ORAL | Status: DC

## 2012-06-10 NOTE — Progress Notes (Signed)
SATURATION QUALIFICATIONS: (This note is used to comply with regulatory documentation for home oxygen)  Patient Saturations on Room Air at Rest =92  Patient Saturations on Room Air while Ambulating = 81  Patient Saturations on 4 Liters of oxygen while Ambulating = 87   Please briefly explain why patient needs home oxygen: Patient ambulated for 100 ft o2 ranged from 87 to 90 on 4 lit. Of oxygen. Respirations very labored,excessive use of ancillary muscle and dyspnea. Patient was also tachypneic and tachycardic  Pt o2 level resting was 92. On 4 lits of o2. Pt, was placed on cont. o2 monitor, i have succeeded in weaning him to 2lits of o2 with patient maintaining sat's at 92.

## 2012-06-10 NOTE — Progress Notes (Signed)
PULMONARY  / CRITICAL CARE MEDICINE  Name: Frank [name] MRN: [id number] DOB: 1956-02-09    ADMISSION DATE:  06/05/2012 CONSULTATION DATE:  1-31  REFERRING MD :  TS  CHIEF COMPLAINT:  SOB  BRIEF PATIENT DESCRIPTION:  57 yo life long 1 ppd smoker with increasing sob over 2 years with intermittent periods of purulent sputum. Admitted 1-29 after failed opt tx at urgent care.   SIGNIFICANT EVENTS / STUDIES:    LINES / TUBES:   CULTURES:  1-30 bc>> 1-30 uc>>neg ANTIBIOTICS: 1-30 levaquin>>  . HISTORY OF PRESENT ILLNESS:  57 yo life long 1 ppd smoker with increasing sob over 2 years with intermittent periods of purulent sputum. Admitted 1-29 after failed opt tx at urgent care. He noted green yellow sputum x 4 days, increasing SOB,walk <20 feet with SOB, ++ wheezes x 1 week. 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 On his best day can walk 50 feet. + orthopnea, no lower ext edema. PCCM asked to evaluate.   SUBJECTIVE:   VITAL SIGNS: Temp:  [97.5 F (36.4 C)-98.4 F (36.9 C)] 98.3 F (36.8 C) (02/03 0350) Pulse Rate:  [85-102] 85  (02/03 0350) Resp:  [20-24] 24  (02/03 0350) BP: (133-139)/(75-78) 133/77 mmHg (02/03 0350) SpO2:  [93 %-95 %] 93 % (02/03 0731) Weight:  [72 kg (158 lb 11.7 oz)] 72 kg (158 lb 11.7 oz) (02/03 0325)  PHYSICAL EXAMINATION: General:  WNWDWM NAD at rest Neuro:  Intact HEENT:  No LAN, poor dentation Neck:  No jvd Cardiovascular:  HSR RRR Lungs:  +wheezes thru out Abdomen:  +bs Musculoskeletal:  Intact Skin:  Warm , no edema   Lab 06/10/12 0655 06/08/12 1319 06/06/12 0640  NA 138 135 135  K 4.2 4.1 4.3  CL 100 97 97  CO2 29 28 26   BUN 27* 22 16  CREATININE 0.77 0.78 0.76  GLUCOSE 141* 170* 117*    Lab 06/06/12 0640 06/05/12 1128  HGB 15.3 16.3  HCT 45.4 47.8  WBC 8.9 8.4  PLT 203 209   No results found.1-30 2 d Left ventricle: The cavity size was normal. Systolic function was normal. The estimated ejection  fraction was in the range of 55% to 60%   ASSESSMENT / PLAN:  IMPRESSION: Emphysema by CXR (hyperinflation) AECOPD with mild persistent wheezing Hypoxemia - ? Chronicity Smoker  PLAN/REC: 1) convert to prednisone and taper over 10-12 days 2) Agree with Levofloxacin  Complete 5 days 3) O2 for home with exertion 4) Smoking cessation 5) Discharge meds >> Spiriva qd + albuterol HFA prn 6) Outpatient visit Dr Delton Coombes 2/14 @ 3:30 PM  Call if I can help prior to discharge  Levy Pupa, MD, PhD 06/10/2012, 9:58 AM [facility] [phone] or if no answer [phone]

## 2012-06-10 NOTE — Progress Notes (Signed)
Internal Medicine Teaching Service Attending Note Date: 06/10/2012  Patient name: Frank [name]  Medical record number: [id number]  Date of birth: 1956-02-10    This patient has been seen and discussed with the house staff. Please see their note for complete details. I concur with their findings with the following additions/corrections:  Asian doing much better. Appreciate pulmonary assistance. (Dr. Delton Coombes) discharge home on prednisone taper and finish 5 days of levofloxacin. He'll go home on oxygen.  Paulette Blanch Dam 06/10/2012, 11:12 AM

## 2012-06-10 NOTE — Progress Notes (Signed)
Pulse oximetry checked while patient was ambulating per MD order. Patient ambulated for 100 ft  o2 ranged from 87 to 90 on 4 lit. Of oxygen. Respirations very labored,excessive use of ancillary muscle and dyspnea. Patient was also tachypneic and tachycardic   Will report findings to MD.

## 2012-06-10 NOTE — Discharge Summary (Signed)
Internal Medicine Teaching [facility] Discharge Note  Name: CASSON CATENA MRN: [id number] DOB: January 29, 1956 57 y.o.  Date of Admission: 06/05/2012 11:26 AM Date of Discharge: 06/10/2012 Attending Physician: Randall Hiss, MD  Discharge Diagnosis: 1. COPD exacerbation 2. Emphysema 3. hypoxemia 4. Tobacco abuse   Discharge Medications:   Medication List     As of 06/10/2012  1:25 PM    STOP taking these medications         TUSSICAPS 10-8 MG Cp12   Generic drug: Hydrocod Polst-Chlorphen Polst      TAKE these medications         albuterol 108 (90 BASE) MCG/ACT inhaler   Commonly known as: PROVENTIL HFA;VENTOLIN HFA   Inhale 1-2 puffs into the lungs every 6 (six) hours as needed. For wheezing      albuterol 0.63 MG/3ML nebulizer solution   Commonly known as: ACCUNEB   Take 1 ampule by nebulization every 6 (six) hours as needed. For cough/wheezing      Fluticasone-Salmeterol 500-50 MCG/DOSE Aepb   Commonly known as: ADVAIR   Inhale 1 puff into the lungs every 12 (twelve) hours.      ipratropium 0.02 % nebulizer solution   Commonly known as: ATROVENT   Take 2.5 mLs (500 mcg total) by nebulization every 6 (six) hours as needed for wheezing.      levofloxacin 750 MG tablet   Commonly known as: LEVAQUIN   Take 1 tablet (750 mg total) by mouth daily.      nicotine 21 mg/24hr patch   Commonly known as: NICODERM CQ - dosed in mg/24 hours   Place 1 patch onto the skin daily.      predniSONE 20 MG tablet   Commonly known as: DELTASONE   3 Tabs PO Days 1-3, then 2 tabs PO Days 4-6, then 1 tab PO Day 7-9, then Half Tab PO Day 10-12      tiotropium 18 MCG inhalation capsule   Commonly known as: SPIRIVA   Place 1 capsule (18 mcg total) into inhaler and inhale daily.        Disposition and follow-up:   Mr.Revin D Romberger was [facility] from [facility] in stable condition.  At the hospital follow up visit please address   1. Compliance with his  home O2 ( 2 L at rest and 4 L with ambulation), prednisone tapering, ABx, inhalers and nebulizer treatment. He is newly diagnosed COPD. 2. Make sure he follows up with Pulmonary as scheduled.  3. Check his O2 Sat 4. May benefit from pulmonary rehab 4. Ensure smoking cessation. Nicotine patch prescribed upon discharge. Needs close follow up with SW.    Follow-up Appointments:     Follow-up Information    Follow up with Almyra Deforest, MD. On 06/18/2012. (10:00)    Contact information:   [address] [place] [address] [phone]       Follow up with Leslye Peer., MD. On 06/21/2012. (3:30 pm)    Contact information:   [address] [place] [address] [phone]       Follow up with Home Health RN and Physical Therapy. Please call [facility] as soon as you arrive home to deliver oxygen.  Marland Kitchen        Discharge Orders    Future Appointments: Provider: Department: Dept Phone: Center:   06/18/2012 10:00 AM Almyra Deforest, MD [facility] [phone] [facility]   06/21/2012 3:30 PM Leslye Peer, MD [facility] [phone] None      Consultations:  [facility]  Procedures Performed:  Dg Chest 2 View  06/07/2012  *RADIOLOGY REPORT*  Clinical Data: Hypoxemia, COPD, shortness of breath  CHEST - 2 VIEW  Comparison: 06/05/2012  Findings: Background emphysema noted with hyperinflation.  Normal heart size and vascularity.  No focal pneumonia, collapse, consolidation, edema, effusion or pneumothorax.  Trachea is midline.  Stable exam.  IMPRESSION: Stable COPD/emphysema.  No superimposed acute process   Original Report Authenticated By: Judie Petit. Miles Costain, M.D.    Dg Chest 2 View  06/05/2012  *RADIOLOGY REPORT*  Clinical Data: Fever.  Shortness of breath.  CHEST - 2 VIEW  Comparison: 02/12/2008  Findings: Lungs appear hyperexpanded with flattening of the hemidiaphragms, increased retrosternal air space and pruning of the pulmonary vasculature in the  periphery, suggestive of underlying COPD.  Mild diffuse bronchial wall thickening, similar to prior examinations.  No acute consolidative airspace disease.  No pleural effusions.  No evidence of pulmonary edema.  Heart size and mediastinal contours are within normal limits.  Atherosclerotic calcifications are noted within the arch of the aorta.  IMPRESSION: 1.  Chronic changes compatible with COPD redemonstrated, as above. 2.  Atherosclerosis.   Original Report Authenticated By: Trudie Reed, M.D.     2D Echo: 06/06/12 - Left ventricle: The cavity size was normal. Systolic function was normal. The estimated ejection fraction was in the range of 55% to 60%. Left ventricular diastolic function parameters were normal. - Pulmonary arteries: PA peak pressure: 44mm Hg (S).  Admission HPI:  Mr. Saetern is a 57 yo male with presumed COPD who presents to [facility] ED for shortness of breath and low oxygen levels noted on home monitoring today. He has been treated for upper respiratory infection and COPD exacerbation at [facility] several times over the past 5 days. He was prescribed a course of azithromycin, prednisone, cough suppressants, nebulized albuterol and Advair diskus (fluticasone-salmeterol) inhaler with some improvement in his symptoms of runny nose, nasal congestion, headfullness and dry cough but states that he is still short of breath when he "moves about". He presented on request of his sister who checked his oxygen level at home and noted that it was "84%". She states that by coming to the ED Mr. Haque would be able to get a primary doctor and oxygen at home. He denies chest pain, orthopnea, headaches, nausea or vomiting but states that he had fever on initial presentation to urgent care several days ago. Of note, the patient initially requested discharge from the ED but after persuasion by his sister agreed to be admitted.      Hospital Course by problem list: 1. COPD  exacerbation, Emphysema, hypoxemia  Patient presented with shortness of breath and hypoxemia on admission. He was noted to have emphysema on exam and imaging study. He was diagnosed with COPD exacerbation, which could relate to his long term Tobacco abuse. He was started on the treatment with steroids, bronchodilators, antibiotic, and spiriva inhaler was added to his home regimen ( Advair and albuterol). His symptoms largely resolved upon discharge.  Home O2, Nebulizer treatment and home health RN/PT were arranged upon discharge.  [facility] was consulted and will follow up as outpatient. Patient is discharged home with prednisone 12 days tapering dose, Levaquin 750 mg daily x 5, Nebulizer treatments and spiriva. He should follow up with the [facility] clinic Dr. Loistine Chance and pulmonary Dr. Delton Coombes as scheduled.   2. Tobacco abuse  Patient reports  he has been smoking for roughly 40 years and has had difficulty quitting in the past.   The nicotine patch was prescribed for him upon discharge. He is very interested in quitting smoking. He is scheduled to follow up with Dr. Loistine Chance at 1000 am on 06/18/12 for further management. May consider medical treatment if needed.    Discharge Vitals:  BP 133/77  Pulse 85  Temp 98.3 F (36.8 C) (Oral)  Resp 24  Ht 5\' 8"  (1.727 m)  Wt 158 lb 11.7 oz (72 kg)  BMI 24.13 kg/m2  SpO2 93%  Discharge Labs:  Results for orders placed during the hospital encounter of 06/05/12 (from the past 24 hour(s))  GLUCOSE, CAPILLARY     Status: Abnormal   Collection Time   06/09/12  9:18 PM      Component Value Range   Glucose-Capillary 173 (*) 70 - 99 mg/dL  GLUCOSE, CAPILLARY     Status: Abnormal   Collection Time   06/10/12  6:19 AM      Component Value Range   Glucose-Capillary 119 (*) 70 - 99 mg/dL  BASIC METABOLIC PANEL     Status: Abnormal   Collection Time   06/10/12  6:55 AM      Component Value Range   Sodium 138  135 - 145 mEq/L   Potassium 4.2  3.5 - 5.1 mEq/L    Chloride 100  96 - 112 mEq/L   CO2 29  19 - 32 mEq/L   Glucose, Bld 141 (*) 70 - 99 mg/dL   BUN 27 (*) 6 - 23 mg/dL   Creatinine, Ser 4.09  0.50 - 1.35 mg/dL   Calcium 9.8  8.4 - 81.1 mg/dL   GFR calc non Af Amer >90  >90 mL/min   GFR calc Af Amer >90  >90 mL/min  GLUCOSE, CAPILLARY     Status: Abnormal   Collection Time   06/10/12 11:22 AM      Component Value Range   Glucose-Capillary 114 (*) 70 - 99 mg/dL    Signed: Oday Ridings 01/06/4781, 1:25 PM   Time Spent on Discharge: 45 minutes Services Ordered on Discharge: home health Equipment Ordered on Discharge: Home O2, Oxygen tank and Nebulizer treatment.

## 2012-06-10 NOTE — Progress Notes (Signed)
Subjective: Frank [name] was seen and examined at bedside.  He claims to be feeling well with improved breathing and no overnight complaints.  He was able to ambulate with RN in hallway today and also was seen by pulmonary.  He is requesting to go home.  He denies any fever, chills, N/V/D, chest pain, or abdominal pain at this time.  Sister at bedside and all questions answered.  He   Objective: Vital signs in last 24 hours: Filed Vitals:   06/09/12 2117 06/10/12 0325 06/10/12 0350 06/10/12 0731  BP: 139/78  133/77   Pulse: 93  85   Temp: 98.4 F (36.9 C)  98.3 F (36.8 C)   TempSrc: Oral  Oral   Resp: 24  24   Height:      Weight:  158 lb 11.7 oz (72 kg)    SpO2: 93%  95% 93%   Weight change: -2 lb 13.9 oz (-1.301 kg)  Intake/Output Summary (Last 24 hours) at 06/10/12 1333 Last data filed at 06/09/12 2200  Gross per 24 hour  Intake    480 ml  Output      0 ml  Net    480 ml   General: alert, well-developed, and cooperative to examination.  Lungs: CTA b/l, wheezing improved.  -rales.   Heart: normal rate, regular rhythm, no murmur, no gallop, and no rub.  Abdomen: soft, non-tender, normal bowel sounds, no distention, no guarding, no rebound tenderness, no hepatomegaly, and no splenomegaly.  Msk: no joint swelling, no joint warmth, and no redness over joints.  Pulses: 2+ DP/PT pulses bilaterally Extremities: No cyanosis, clubbing, edema Neurologic: alert & oriented X3, cranial nerves II-XII intact, strength normal in all extremities, sensation intact to light touch, and gait normal.   Lab Results: Basic Metabolic Panel:  Lab 06/10/12 1610 06/08/12 1319  NA 138 135  K 4.2 4.1  CL 100 97  CO2 29 28  GLUCOSE 141* 170*  BUN 27* 22  CREATININE 0.77 0.78  CALCIUM 9.8 9.7  MG -- --  PHOS -- --   CBC:  Lab 06/06/12 0640 06/05/12 1128  WBC 8.9 8.4  NEUTROABS -- 5.4  HGB 15.3 16.3  HCT 45.4 47.8  MCV 85.5 85.5  PLT 203 209   Cardiac Enzymes:  Lab 06/06/12 1159  06/06/12 0640 06/06/12 0040  CKTOTAL -- -- --  CKMB -- -- --  CKMBINDEX -- -- --  TROPONINI <0.30 <0.30 <0.30   BNP:  Lab 06/06/12 0040  PROBNP 18.0   D-Dimer:  Lab 06/06/12 0845  DDIMER <0.27   CBG:  Lab 06/10/12 1122 06/10/12 0619 06/09/12 2118 06/09/12 0607 06/08/12 2053 06/08/12 1634  GLUCAP 114* 119* 173* 134* 157* 117*   Urinalysis:  Lab 06/05/12 1236  COLORURINE YELLOW  LABSPEC 1.023  PHURINE 6.5  GLUCOSEU NEGATIVE  HGBUR NEGATIVE  BILIRUBINUR NEGATIVE  KETONESUR 15*  PROTEINUR NEGATIVE  UROBILINOGEN 2.0*  NITRITE NEGATIVE  LEUKOCYTESUR NEGATIVE    Micro Results: Recent Results (from the past 240 hour(s))  URINE CULTURE     Status: Normal   Collection Time   06/05/12 12:36 PM      Component Value Range Status Comment   Specimen Description URINE, CLEAN CATCH   Final    Special Requests NONE   Final    Culture  Setup Time 06/05/2012 21:25   Final    Colony Count NO GROWTH   Final    Culture NO GROWTH   Final    Report Status  06/06/2012 FINAL   Final   CULTURE, BLOOD (ROUTINE X 2)     Status: Normal   Collection Time   06/05/12  1:00 PM      Component Value Range Status Comment   Specimen Description BLOOD ARM LEFT   Final    Special Requests BOTTLES DRAWN AEROBIC AND ANAEROBIC 10CC   Final    Culture  Setup Time 06/05/2012 18:58   Final    Culture     Final    Value: NO GROWTH 5 DAYS     Note: Gram Stain Report Called to,Read Back By and Verified With: OLUFUNKE IDOWU ON 06/06/2012 AT 9:13P BY WILEJ PREVIOUSLY REPORTED AS GRAM POSITIVE COCCI IN PAIRS CORRECTED RESULTS CALLED TO: SHERRY PRICE 06/10/12 1135 BY SMITHERSJ   Report Status 06/10/2012 FINAL   Final   CULTURE, BLOOD (ROUTINE X 2)     Status: Normal (Preliminary result)   Collection Time   06/05/12  1:00 PM      Component Value Range Status Comment   Specimen Description BLOOD RIGHT HAND   Final    Special Requests BOTTLES DRAWN AEROBIC AND ANAEROBIC 10CC   Final    Culture  Setup Time  06/05/2012 18:58   Final    Culture     Final    Value:        BLOOD CULTURE RECEIVED NO GROWTH TO DATE CULTURE WILL BE HELD FOR 5 DAYS BEFORE ISSUING A FINAL NEGATIVE REPORT   Report Status PENDING   Incomplete    Medications: I have reviewed the patient's current medications. Scheduled Meds:    . albuterol  2.5 mg Nebulization Q6H  . enoxaparin (LOVENOX) injection  40 mg Subcutaneous Q24H  . levofloxacin  750 mg Oral Daily  . LORazepam  0.5 mg Oral Once  . methylPREDNISolone (SOLU-MEDROL) injection  80 mg Intravenous Q12H  . nicotine  21 mg Transdermal Daily  . sodium chloride  3 mL Intravenous Q12H  . tiotropium  18 mcg Inhalation Daily   Continuous Infusions:  PRN Meds:.sodium chloride, albuterol, sodium chloride  Assessment/Plan: 1. COPD exacerbation--improved.  Moderate to severe untreated COPD.  Repeat CXR unchanged. Slowly tapering off steroids. Will need home O2 upon discharge.  - O2 therapy with [place] 2-4L, d/c home on oxygen - Bld Cx neg - Check Alpha 1 Antitrypsin 1 pending - BD , spiriva - Solumedrol 80 q12--long prednisone taper on discharge  - Levaquin for 5 days course - Home health PT  - follow up with [facility] at 330pm on 06/21/12--Dr. [name] - smoking cessation strongly advised  2. Tobacco  -education  -nicotine patch -cessation strongly advised  Dispo:  - discharge to home today  .Services Needed at time of discharge: Y = Yes, Blank = No PT:  yes   OT:   RN:  yes   Equipment:  yes oxygen tank and nebulizer treatment   Other:     LOS: 5 days   Darden Palmer 06/10/2012, 1:33 PM

## 2012-06-11 LAB — CULTURE, BLOOD (ROUTINE X 2): Culture: NO GROWTH

## 2012-06-11 NOTE — Discharge Summary (Signed)
Internal Medicine Teaching Service Attending Note Date: 06/11/2012  Patient name: Frank [name]  Medical record number: [id number]  Date of birth: Sep 29, 1955    This patient has been seen and discussed with the house staff. Please see their note for complete details. I concur with their findings with the following additions/corrections: Patient was dramatically improved on day of discharge. He is scheduled for followup with pulmonary medicine and home oxygen has been arranged.   Paulette Blanch Dam 06/11/2012, 1:48 PM

## 2012-06-17 LAB — ALPHA-1 ANTITRYPSIN PHENOTYPE: A-1 Antitrypsin: 195 mg/dL (ref 83–199)

## 2012-06-18 ENCOUNTER — Ambulatory Visit (INDEPENDENT_AMBULATORY_CARE_PROVIDER_SITE_OTHER): Payer: BC Managed Care – PPO | Admitting: Internal Medicine

## 2012-06-18 ENCOUNTER — Encounter: Payer: Self-pay | Admitting: Internal Medicine

## 2012-06-18 VITALS — BP 132/77 | HR 96 | Temp 97.5°F | Ht 68.0 in | Wt 166.9 lb

## 2012-06-18 DIAGNOSIS — J449 Chronic obstructive pulmonary disease, unspecified: Secondary | ICD-10-CM

## 2012-06-18 DIAGNOSIS — F17200 Nicotine dependence, unspecified, uncomplicated: Secondary | ICD-10-CM

## 2012-06-18 DIAGNOSIS — Z23 Encounter for immunization: Secondary | ICD-10-CM | POA: Insufficient documentation

## 2012-06-18 DIAGNOSIS — Z720 Tobacco use: Secondary | ICD-10-CM

## 2012-06-18 DIAGNOSIS — Z299 Encounter for prophylactic measures, unspecified: Secondary | ICD-10-CM

## 2012-06-18 LAB — HEPATIC FUNCTION PANEL
ALT: 22 U/L (ref 0–53)
AST: 15 U/L (ref 0–37)
Albumin: 3.9 g/dL (ref 3.5–5.2)
Total Protein: 6.7 g/dL (ref 6.0–8.3)

## 2012-06-18 LAB — POCT GLYCOSYLATED HEMOGLOBIN (HGB A1C): Hemoglobin A1C: 6.1

## 2012-06-18 LAB — LIPID PANEL: Cholesterol: 198 mg/dL (ref 0–200)

## 2012-06-18 LAB — GLUCOSE, CAPILLARY: Glucose-Capillary: 107 mg/dL — ABNORMAL HIGH (ref 70–99)

## 2012-06-18 MED ORDER — BUPROPION HCL ER (SR) 150 MG PO TB12: 150.0000 mg | ORAL_TABLET | Freq: Two times a day (BID) | ORAL | Status: DC

## 2012-06-18 NOTE — Assessment & Plan Note (Signed)
tdap given today

## 2012-06-18 NOTE — Assessment & Plan Note (Signed)
Patient was given Pneumococcal and Influenza vaccination on 06/06/12  Tdap given today Will discuss about colonoscopy during next office visit.  Will obtain lipid panel and Hgb A1c today.

## 2012-06-18 NOTE — Assessment & Plan Note (Signed)
Will follow up with Pulmonary on Friday. Continue to need oxygen

## 2012-06-18 NOTE — Assessment & Plan Note (Signed)
Counseled patient on smoking cessation. Patient is ready but needs more help. I will continue nicotine patches and start patient on Bupropion 150 mg daily for the first 3 days and then increase it to bid. Informed the patient if he notices any symptoms including palpitation, chest pain, mood changes to call the clinic for further evaluation and management

## 2012-06-18 NOTE — Progress Notes (Addendum)
Subjective:   Patient ID: Frank [name] male   DOB: 10-Jun-1955 57 y.o.   MRN: [id number]  HPI: [name] is a 57 y.o.  Male with PMH signfiicant as outlined below who presented to the clinic for a hospital follow up. Patient is a new patient to the clinic.  Patient reports after d/c from the hospital he has been fine. Was able to obtain all his medication.  He completed his antibiotic therapy. Currently still taking prednisone taper.  His main concern today is that he continues to smoke . He was prescribed nicotine patches which is currently using. He smokes now  half a ppd for 5 days and he is more then ready to ready to quit . He had been smoking for more then 35 years ( 1 ppd) . He would like to have something to help quiting.      Past Medical History  Diagnosis Date  . Tobacco abuse 06/05/2012  . Obstructive chronic bronchitis with exacerbation 06/07/2012  . Hypoxemia 06/07/2012  . Emphysema 06/07/2012  . COPD (chronic obstructive pulmonary disease) 06/05/2012   Current Outpatient Prescriptions  Medication Sig Dispense Refill  . albuterol (ACCUNEB) 0.63 MG/3ML nebulizer solution Take 1 ampule by nebulization every 6 (six) hours as needed. For cough/wheezing      . albuterol (PROVENTIL HFA;VENTOLIN HFA) 108 (90 BASE) MCG/ACT inhaler Inhale 1-2 puffs into the lungs every 6 (six) hours as needed. For wheezing      . buPROPion (WELLBUTRIN SR) 150 MG 12 hr tablet Take 1 tablet (150 mg total) by mouth 2 (two) times daily. Take one tablet once a day for 3 days and then increase to one tablet twice a day.  60 tablet  1  . Fluticasone-Salmeterol (ADVAIR) 500-50 MCG/DOSE AEPB Inhale 1 puff into the lungs every 12 (twelve) hours.      Marland Kitchen ipratropium (ATROVENT) 0.02 % nebulizer solution Take 2.5 mLs (500 mcg total) by nebulization every 6 (six) hours as needed for wheezing.  75 mL  12  . nicotine (NICODERM CQ - DOSED IN MG/24 HOURS) 21 mg/24hr patch Place 1 patch onto the skin daily.  28  patch  0  . predniSONE (DELTASONE) 20 MG tablet 3 Tabs PO Days 1-3, then 2 tabs PO Days 4-6, then 1 tab PO Day 7-9, then Half Tab PO Day 10-12  20 tablet  0  . tiotropium (SPIRIVA) 18 MCG inhalation capsule Place 1 capsule (18 mcg total) into inhaler and inhale daily.  30 capsule  12   No current facility-administered medications for this visit.   No family history on file. History   Social History  . Marital Status: Single    Spouse Name: N/A    Number of Children: N/A  . Years of Education: N/A   Social History Main Topics  . Smoking status: Current Every Day Smoker -- 0.30 packs/day    Types: Cigarettes  . Smokeless tobacco: None     Comment: Trying to quit.Uses smokeless cigarettes and patches.  . Alcohol Use: No  . Drug Use: No  . Sexually Active: None   Other Topics Concern  . None   Social History Narrative  . None   Review of Systems: Constitutional: Denies fever, chills, diaphoresis, appetite change and fatigue.  HEENT: Denies congestion, sore throat, rhinorrhea,  trouble swallowing, neck pain, Respiratory: Noted improved  SOB, DOE, cough, chest tightness,  and wheezing.   Cardiovascular: Denies chest pain, palpitations and leg swelling.  Gastrointestinal: Denies nausea, vomiting,  abdominal pain, diarrhea, constipation, blood in stool and abdominal distention.  Genitourinary: Denies dysuria, urgency, frequency, hematuria, flank pain and difficulty urinating.  Musculoskeletal: Denies myalgias, back pain, joint swelling, arthralgias and gait problem.  Skin: Denies pallor, rash and wound.  Neurological: Denies dizziness, seizures, syncope, weakness, light-headedness, numbness and headaches.  Hematological: Denies adenopathy. Easy bruising, personal or family bleeding history  Psychiatric/Behavioral: Denies suicidal ideation, mood changes, confusion, nervousness, sleep disturbance and agitation  Objective:  Physical Exam: Filed Vitals:   06/18/12 1021  BP: 132/77   Pulse: 96  Temp: 97.5 F (36.4 C)  TempSrc: Oral  Height: 5\' 8"  (1.727 m)  Weight: 166 lb 14.4 oz (75.705 kg)  SpO2: 96%   Constitutional: Vital signs reviewed.  Patient is a well-developed and well-nourished male  in no acute distress and cooperative with exam. Alert and oriented x3.  Head: Normocephalic and atraumatic Ear: TM normal bilaterally Mouth: no erythema or exudates, MMM Eyes: PERRL, EOMI, conjunctivae normal, No scleral icterus.  Neck: Supple Cardiovascular: RRR, S1 normal, S2 normal, no MRG, pulses symmetric and intact bilaterally Pulmonary/Chest: Good air movement, rhonchi and wheezing present Abdominal: Soft. Non-tender, non-distended, bowel sounds are normal, GU: no CVA tenderness Hematology: no cervical adenopathy.  Neurological: A&O x3, Strength is normal and symmetric bilaterally, cranial nerve II-XII are grossly intact, no focal motor deficit, sensory intact to light touch bilaterally.  Skin: Warm, dry and intact. No rash, cyanosis, or clubbing.  Psychiatric: Normal mood and affect. speech and behavior is normal.

## 2012-06-18 NOTE — Patient Instructions (Signed)
You will be started on a new medication called  Bupropion to help to quit smoking.  If you notice any chest pain, palpitation / heart raising , mood changes please call the clinic for further instruction ( ph [phone])

## 2012-06-21 ENCOUNTER — Inpatient Hospital Stay: Payer: BC Managed Care – PPO | Admitting: Emergency Medicine

## 2012-06-25 ENCOUNTER — Encounter: Payer: Self-pay | Admitting: Adult Health

## 2012-06-25 ENCOUNTER — Ambulatory Visit (INDEPENDENT_AMBULATORY_CARE_PROVIDER_SITE_OTHER): Payer: BC Managed Care – PPO | Admitting: Adult Health

## 2012-06-25 VITALS — BP 104/70 | HR 88 | Temp 99.1°F | Ht 68.0 in | Wt 167.0 lb

## 2012-06-25 DIAGNOSIS — J449 Chronic obstructive pulmonary disease, unspecified: Secondary | ICD-10-CM

## 2012-06-25 NOTE — Assessment & Plan Note (Addendum)
Recent exacerbation  Will need to return for PFt   Plan  Continue on Advair 1 puff Twice daily   Continue on Spiriva 1 puff daily  Stop Atrovent Neb.  Work on stopping smoking  Follow up Dr. Delton Coombes  In 4 weeks with PFTs  Wear Oxygen As needed  And At bedtime   Please contact office for sooner follow up if symptoms do not improve or worsen or seek emergency care

## 2012-06-25 NOTE — Progress Notes (Signed)
  Subjective:    Patient ID: Frank [name], male    DOB: 1955/06/02, 57 y.o.   MRN: [id number]  HPI 57 yo smoker admitted 06/05/12 for COPD exacerbation .  PCCM w/ initial consult   06/25/2012 Post Hospital follow up  Pt w/ progressive cough, dyspnea and wheezing 4 days PTA . Admitted started on aggressive pulmonary hygiene regimen w/ nebs, steroids and abx.  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  Has FH of ? Lung dz -parent deceased.   Since discharge he is feeling better Less dyspnea and cough  Started on advair and spiriva at discharge Finished steroids taper.  Walking sats in office w/out desat .  Wants to go back to work.  No hemoptysis , chest pain or edema  CXR showed COPD changes .  No previous dx of COPD , no PFT in past.     Review of Systems Constitutional:   No  weight loss, night sweats,  Fevers, chills, fatigue, or  lassitude.  HEENT:   No headaches,  Difficulty swallowing,  Tooth/dental problems, or  Sore throat,                No sneezing, itching, ear ache, nasal congestion, post nasal drip,   CV:  No chest pain,  Orthopnea, PND, swelling in lower extremities, anasarca, dizziness, palpitations, syncope.   GI  No heartburn, indigestion, abdominal pain, nausea, vomiting, diarrhea, change in bowel habits, loss of appetite, bloody stools.   Resp:   No coughing up of blood.  No change in color of mucus.  No wheezing.  No chest wall deformity  Skin: no rash or lesions.  GU: no dysuria, change in color of urine, no urgency or frequency.  No flank pain, no hematuria   MS:  No joint pain or swelling.  No decreased range of motion.  No back pain.  Psych:  No change in mood or affect. No depression or anxiety.  No memory loss.          Objective:   Physical Exam GEN: A/Ox3; pleasant , NAD, thin   HEENT:  [place]/AT,  EACs-clear, TMs-wnl, NOSE-clear, THROAT-clear, no lesions, no postnasal drip or exudate noted. , missing  several teeth   NECK:  Supple w/ fair ROM; no JVD; normal carotid impulses w/o bruits; no thyromegaly or nodules palpated; no lymphadenopathy.  RESP  Clear  P & A; w/o, wheezes/ rales/ or rhonchi.no accessory muscle use, no dullness to percussion  CARD:  RRR, no m/r/g  , no peripheral edema, pulses intact, no cyanosis or clubbing.  GI:   Soft & nt; nml bowel sounds; no organomegaly or masses detected.  Musco: Warm bil, no deformities or joint swelling noted.   Neuro: alert, no focal deficits noted.    Skin: Warm, no lesions or rashes         Assessment & Plan:

## 2012-06-25 NOTE — Patient Instructions (Addendum)
Continue on Advair 1 puff Twice daily   Continue on Spiriva 1 puff daily  Stop Atrovent Neb.  Work on stopping smoking  Follow up Dr. Delton Coombes  In 4 weeks with PFTs  Wear Oxygen As needed  And At bedtime   Please contact office for sooner follow up if symptoms do not improve or worsen or seek emergency care

## 2012-06-27 ENCOUNTER — Telehealth: Payer: Self-pay | Admitting: *Deleted

## 2012-06-27 NOTE — Telephone Encounter (Signed)
I talked with Dr Loistine Chance and she advised pt to decrease dose of Wellbutrin to once a day and see how he does with that.  I called sister and told her.  She states she just woke pt up and he just stated he was tired. Pt has appointment for next month.  Sister will call for any concerns.

## 2012-06-27 NOTE — Telephone Encounter (Signed)
Call from pt's sister stating pt was started on Wellbutrin last week.  When he takes Wellbutrin  in am he will sleep for an hour then is okay.  Today he has been sleeping all day.  He was up for breakfast then back to bed, still asleep now.   She thinks it's from the medication. Pt # [id number]

## 2012-07-15 ENCOUNTER — Encounter: Payer: Self-pay | Admitting: Internal Medicine

## 2012-07-15 ENCOUNTER — Ambulatory Visit (INDEPENDENT_AMBULATORY_CARE_PROVIDER_SITE_OTHER): Payer: BC Managed Care – PPO | Admitting: Internal Medicine

## 2012-07-15 VITALS — BP 132/77 | HR 92 | Temp 97.0°F | Ht 68.0 in | Wt 168.5 lb

## 2012-07-15 DIAGNOSIS — F17200 Nicotine dependence, unspecified, uncomplicated: Secondary | ICD-10-CM

## 2012-07-15 DIAGNOSIS — R0902 Hypoxemia: Secondary | ICD-10-CM

## 2012-07-15 DIAGNOSIS — Z299 Encounter for prophylactic measures, unspecified: Secondary | ICD-10-CM

## 2012-07-15 DIAGNOSIS — J4489 Other specified chronic obstructive pulmonary disease: Secondary | ICD-10-CM

## 2012-07-15 DIAGNOSIS — Z720 Tobacco use: Secondary | ICD-10-CM

## 2012-07-15 DIAGNOSIS — J449 Chronic obstructive pulmonary disease, unspecified: Secondary | ICD-10-CM

## 2012-07-15 MED ORDER — BUPROPION HCL ER (SR) 150 MG PO TB12: 150.0000 mg | ORAL_TABLET | Freq: Every day | ORAL | Status: DC

## 2012-07-15 NOTE — Progress Notes (Signed)
Subjective:   Patient ID: Frank [name] male   DOB: 06-14-1955 57 y.o.   MRN: [id number]  HPI: [name] is a 57 y.o. male with past medical history significant as outlined below who presented to the clinic for a followup. Patient was started on Wellbutrin to the last office visit for smoking cessation. Today he reports that it makes him somewhat sleepy during the day. He is currently taking 150 mg twice a day. Furthermore patient noted that if he does some strain his work he feels some want short of breath. After sitting down for a few seconds it completely resolves A. he can continue his usual business. Patient denies any dizziness, chest pain, diaphoresis, weakness. Patient reports since one week he has been not using any oxygen. He checks his oxygen at home which is currently about 92%. He furthermore reports that he has not been smoking for 1 week.  Patient is scheduled to get a PFT on 3/25 with Dr.[name]  Past Medical History  Diagnosis Date  . Tobacco abuse 06/05/2012  . Obstructive chronic bronchitis with exacerbation 06/07/2012  . Hypoxemia 06/07/2012  . Emphysema 06/07/2012  . COPD (chronic obstructive pulmonary disease) 06/05/2012   Current Outpatient Prescriptions  Medication Sig Dispense Refill  . albuterol (ACCUNEB) 0.63 MG/3ML nebulizer solution Take 1 ampule by nebulization every 6 (six) hours as needed. For cough/wheezing      . albuterol (PROVENTIL HFA;VENTOLIN HFA) 108 (90 BASE) MCG/ACT inhaler Inhale 1-2 puffs into the lungs every 6 (six) hours as needed. For wheezing      . buPROPion (WELLBUTRIN SR) 150 MG 12 hr tablet Take 150 mg by mouth 2 (two) times daily.      . Fluticasone-Salmeterol (ADVAIR) 500-50 MCG/DOSE AEPB Inhale 1 puff into the lungs every 12 (twelve) hours.      Marland Kitchen ipratropium (ATROVENT) 0.02 % nebulizer solution Take 2.5 mLs (500 mcg total) by nebulization every 6 (six) hours as needed for wheezing.  75 mL  12  . nicotine (NICODERM CQ - DOSED IN MG/24  HOURS) 21 mg/24hr patch Place 1 patch onto the skin daily.  28 patch  0  . tiotropium (SPIRIVA) 18 MCG inhalation capsule Place 1 capsule (18 mcg total) into inhaler and inhale daily.  30 capsule  12   No current facility-administered medications for this visit.   No family history on file. History   Social History  . Marital Status: Single    Spouse Name: N/A    Number of Children: N/A  . Years of Education: N/A   Social History Main Topics  . Smoking status: Former Smoker -- 35 years    Types: Cigarettes  . Smokeless tobacco: None     Comment: No cigarette x 1 week.  . Alcohol Use: No  . Drug Use: No  . Sexually Active: None   Other Topics Concern  . None   Social History Narrative  . None   Review of Systems: Constitutional: Denies fever, chills, diaphoresis, appetite change and fatigue.  Respiratory: Mild SOB, DOE, cough, chest tightness,  and wheezing.   Cardiovascular: Denies chest pain, palpitations and leg swelling.  Gastrointestinal: Denies nausea, vomiting, abdominal pain, diarrhea, constipation, Neurological: Denies dizziness,  weakness, light-headedness, numbness and headaches.    Objective:  Physical Exam: Filed Vitals:   07/15/12 1349  BP: 132/77  Pulse: 92  Temp: 97 F (36.1 C)  TempSrc: Oral  Height: 5\' 8"  (1.727 m)  Weight: 168 lb 8 oz (76.431 kg)  SpO2:  94%   Constitutional: Vital signs reviewed.  Patient is a well-developed and well-nourished male in no acute distress and cooperative with exam. Alert and oriented x3.  Neck: Supple,  Cardiovascular: RRR, S1 normal, S2 normal, no MRG, pulses symmetric and intact bilaterally Pulmonary/Chest: Good air movement. Few wheezes at the basis notable Abdominal: Soft. Non-tender, non-distended, bowel sounds are normal,  Neurological: A&O x3,

## 2012-07-15 NOTE — Patient Instructions (Signed)
Decrease bupropion to 150 mg at bedtime.  Keep up the good work

## 2012-07-15 NOTE — Assessment & Plan Note (Signed)
Patient quit smoking one week ago. Do to significant insomnia to him today I would decrease Wellbutrin to 150 mg daily at bedtime. Encouraged patient to continue the good work.

## 2012-07-15 NOTE — Assessment & Plan Note (Signed)
Since 1 week patient did not use oxygen at home. Today's oxygen level is is 94%

## 2012-07-15 NOTE — Assessment & Plan Note (Signed)
Patient would like to defer colonoscopy to the next office visit.

## 2012-07-24 ENCOUNTER — Telehealth: Payer: Self-pay | Admitting: *Deleted

## 2012-07-24 NOTE — Telephone Encounter (Signed)
Pt calls and states he has "head cold", denies fever, is worried that he may be starting pneumonia again, was recently inpt for pneumonia, appt given for 3/20 at 0815 dr Virgina Organ

## 2012-07-25 ENCOUNTER — Encounter: Payer: Self-pay | Admitting: Internal Medicine

## 2012-07-25 ENCOUNTER — Ambulatory Visit (INDEPENDENT_AMBULATORY_CARE_PROVIDER_SITE_OTHER): Payer: BC Managed Care – PPO | Admitting: Internal Medicine

## 2012-07-25 VITALS — BP 128/80 | HR 91 | Temp 96.7°F | Ht 68.0 in | Wt 165.4 lb

## 2012-07-25 DIAGNOSIS — R0902 Hypoxemia: Secondary | ICD-10-CM

## 2012-07-25 DIAGNOSIS — R0981 Nasal congestion: Secondary | ICD-10-CM | POA: Insufficient documentation

## 2012-07-25 DIAGNOSIS — J3489 Other specified disorders of nose and nasal sinuses: Secondary | ICD-10-CM

## 2012-07-25 DIAGNOSIS — J449 Chronic obstructive pulmonary disease, unspecified: Secondary | ICD-10-CM

## 2012-07-25 DIAGNOSIS — Z720 Tobacco use: Secondary | ICD-10-CM

## 2012-07-25 DIAGNOSIS — F17200 Nicotine dependence, unspecified, uncomplicated: Secondary | ICD-10-CM

## 2012-07-25 DIAGNOSIS — Z299 Encounter for prophylactic measures, unspecified: Secondary | ICD-10-CM

## 2012-07-25 NOTE — Assessment & Plan Note (Signed)
Complaining of 2 days of nasal congestion and mild cough after cleaning ice off car in the cold weather on Tuesday.  Was concerned about possible COPD exacerbation and wanted to come in before symptoms got worse.  Denies any productive cough, sputum, and says could is very infrequently.  Main complaint is just congestion which is improving now.  Has not taking any otc medications.  o2 sat on room air at home noted to be 94-95% and in clinic today 91%.  Is on home o2 3L  prn.    -does not appear to have CODP exacerbation at this time.  Likely mild URI with resolving nasal congestion. -recommend fluids and rest -otc de-congestant prn -if notice fever, worsening shortness of breath, productive cough and change in sputum quality to contact clinic or if severe go to ED -continue nebulizer treatment q6h prn but hold if tachy or dizzy

## 2012-07-25 NOTE — Assessment & Plan Note (Signed)
o2 sat 91% on room air in clinic with nasal congestion and 86% on ambulation on room air today

## 2012-07-25 NOTE — Progress Notes (Signed)
Subjective:   Patient ID: Frank [name] male   DOB: [birth year] 57 y.o.   MRN: [id number]  HPI: [name] is a 57 y.o. 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 He denies worsening shortness of breath at this time but was concerned of possible copd exacerbation as before that led to hospitalization so he came in today.  He denies any frequent cough and/or sputum production at this time and claims to be feeling better since his symptoms first started in the cold weather.  He took a breathing nebulizer treatment at 6am this morning.    We had a long discussion about COPD cardinal symptoms and being observant of exacerbation and symptoms.  He is aware of when to call us if symptoms get worse and he starts noticing worsening shortness of breath, cough with sputum and or change in sputum quality.    Otherwise, he reports being 2 weeks free of smoking and doing well on bupropion and patches.  He has no desire to start smoking again at this time and I congratulated him on his efforts and encouraged him to continue.    He denies any fever, chills, N/V/D, wheezing, abdominal pain, chest pain, abdominal pain, and or any urinary complaints at this time.  He is scheduled to get his PFTs done on 07/30/12 and is adherent to his medications and inhalers.       Past Medical History  Diagnosis Date  . Tobacco abuse 06/05/2012  . Obstructive chronic bronchitis with exacerbation 06/07/2012  . Hypoxemia 06/07/2012  . Emphysema 06/07/2012  . COPD (chronic obstructive pulmonary disease) 06/05/2012   Current Outpatient Prescriptions  Medication Sig Dispense Refill  . albuterol (ACCUNEB) 0.63 MG/3ML nebulizer solution Take 1 ampule by nebulization every 6 (six) hours as needed. For cough/wheezing      . albuterol (PROVENTIL HFA;VENTOLIN HFA) 108 (90 BASE) MCG/ACT inhaler Inhale 1-2  puffs into the lungs every 6 (six) hours as needed. For wheezing      . buPROPion (WELLBUTRIN SR) 150 MG 12 hr tablet Take 1 tablet (150 mg total) by mouth daily.      . Fluticasone-Salmeterol (ADVAIR) 500-50 MCG/DOSE AEPB Inhale 1 puff into the lungs every 12 (twelve) hours.      Marland Kitchen ipratropium (ATROVENT) 0.02 % nebulizer solution Take 2.5 mLs (500 mcg total) by nebulization every 6 (six) hours as needed for wheezing.  75 mL  12  . nicotine (NICODERM CQ - DOSED IN MG/24 HOURS) 21 mg/24hr patch Place 1 patch onto the skin daily.  28 patch  0  . tiotropium (SPIRIVA) 18 MCG inhalation capsule Place 1 capsule (18 mcg total) into inhaler and inhale daily.  30 capsule  12   No current facility-administered medications for this visit.   History reviewed. No pertinent family history. History   Social History  . Marital Status: Single    Spouse Name: N/A    Number of Children: N/A  . Years of Education: N/A   Social History Main Topics  . Smoking status: Former Smoker -- 35 years    Types: Cigarettes    Quit date: 07/06/2012  . Smokeless tobacco: None  . Alcohol Use: No  . Drug Use: No  . Sexually Active: No   Other Topics Concern  . None   Social History Narrative  . None   Review of Systems: Constitutional:  Denies fever, chills, diaphoresis, appetite change and fatigue.  HEENT: congestion.  Denies photophobia, eye pain, redness, hearing loss, ear pain, sore throat, rhinorrhea, sneezing, mouth sores, trouble swallowing, neck pain, neck stiffness and tinnitus.   Respiratory: DOE, occasional cough.  Denies SOB, chest tightness,  and wheezing.   Cardiovascular: Denies chest pain, palpitations and leg swelling.  Gastrointestinal: Denies nausea, vomiting, abdominal pain, diarrhea, constipation, blood in stool and abdominal distention.  Genitourinary: Denies dysuria, urgency, frequency, hematuria, flank pain and difficulty urinating.  Musculoskeletal: Denies myalgias, back pain, joint  swelling, arthralgias and gait problem.  Skin: Denies pallor, rash and wound.  Neurological: Denies dizziness, seizures, syncope, weakness, light-headedness, numbness and headaches.  Hematological: Denies adenopathy. Easy bruising, personal or family bleeding history  Psychiatric/Behavioral: Denies suicidal ideation, mood changes, confusion, nervousness, sleep disturbance and agitation  Objective:  Physical Exam: Filed Vitals:   07/25/12 0818 07/25/12 0856  BP: 128/80   Pulse: 91   Temp: 96.7 F (35.9 C)   TempSrc: Oral   Height: 5\' 8"  (1.727 m)   Weight: 165 lb 6.4 oz (75.025 kg)   SpO2: 91% 86%   Constitutional: Vital signs reviewed.  Patient is a well-developed and well-nourished male in no acute distress and cooperative with exam. Alert and oriented x3.  Head: Normocephalic and atraumatic Ear: TM normal bilaterally Mouth: no erythema or exudates, MMM Eyes: PERRL, EOMI, conjunctivae normal, No scleral icterus.  Nose: nasal congestion Neck: Supple, Trachea midline normal ROM Cardiovascular: RRR, S1 normal, S2 normal, no MRG, pulses symmetric and intact bilaterally Pulmonary/Chest: CTAB, no wheezes, rales, or rhonchi Abdominal: Soft. Non-tender, non-distended, bowel sounds are normal, no masses, organomegaly, or guarding present.  GU: no CVA tenderness Musculoskeletal: No joint deformities, erythema, or stiffness, ROM full and no nontender Hematology: no cervical adenopathy.  Neurological: A&O x3, Strength is normal and symmetric bilaterally, cranial nerve II-XII are grossly intact, no focal motor deficit, sensory intact to light touch bilaterally.  Skin: Warm, dry and intact. No rash, cyanosis, or clubbing.  Psychiatric: Normal mood and affect. speech and behavior is normal. Judgment and thought content normal. Cognition and memory are normal.   Assessment & Plan:  Discussed with Dr. Dalphine Handing -monitor for COPD exacerbation, currently just seems to have nasal congestion.  Needs  to wear home o2 with prn breathing treatments.

## 2012-07-25 NOTE — Patient Instructions (Addendum)
Thank you for coming in today   General Instructions: You can try over the counter de-congestants to help your nasal congestion: dayquil, tylenol cold and sinus, alka-seltzer to see if that give you any relief  If you start noticing 2 more more of the cardinal symptoms you and i discussed today: worsening shortness of breath, productive cough with more sputum or change in color of your sputum call the clinic immediately [phone] or if severe go to ED  Take your nebulizer treatment every 6 hours as needed--but be sure to give yourself time and check your HR.  You may notice some dizziness and heart rate increase, if that is the case you can give yourself more time between treatments.    Continue your advair, spiriva, and albuterol  I am SUPER proud of you for quitting smoking, keep up the good work and let us know if you need any assistance.  Call 1800quitnow as well  Be sure to call us to help schedule your colonoscopy after jury duty  Continue to use your oxygen at home and monitor your o2 saturation level, below 90% consistently you need to call us.    Treatment Goals:  Goals (1 Years of Data) as of 07/25/12         07/15/12 06/18/12     Lifestyle    . Quit smoking / using tobacco  Yes No     Progress Toward Treatment Goals:  Treatment Goal 07/25/2012  Stop smoking stopped smoking   Self Care Goals & Plans:  Self Care Goal 07/25/2012  Stop smoking call [facility] (1-800-QUIT-NOW)    Chronic Obstructive Pulmonary Disease Chronic obstructive pulmonary disease (COPD) is a lung disease. The lungs become damaged, making it hard to get air in and out of your lungs. The damage to your lungs cannot be changed.  HOME CARE  Stop smoking if you smoke. Avoid secondhand smoke.  Only take medicine as told by your doctor.  Talk to your doctor about using cough syrup or over-the-counter medicines.  Drink enough fluids to keep your pee (urine) clear or pale yellow.  Use a humidifier  or vaporizer. This may help loosen the thick spit (mucus).  Talk to your doctor about vaccines that help prevent other lung problems (pneumonia and flu vaccines).  Use home oxygen as told by your doctor.  Stay active and exercise.  Eat healthy foods. GET HELP RIGHT AWAY IF:   Your heart is beating fast.  You become disturbed, confused, shake, or are dazed.  You have trouble breathing.  You have chest pain.  You have a fever.  You cough up thick spit that is yellowish-white or green.  Your breathing becomes worse when you exercise.  You are running out of the medicine you take for your breathing. MAKE SURE YOU:   Understand these instructions.  Will watch your condition.  Will get help right away if you are not doing well or get worse. Document Released: 10/11/2007 Document Revised: 07/17/2011 Document Reviewed: 06/24/2010 [facility] Patient Information 2013 [place], [place].

## 2012-07-25 NOTE — Assessment & Plan Note (Addendum)
Smoke free for 2 weeks! Congratulated on effort.    He is on wellbutrin and nicotine patch and plans to continue cessation  Assessment:  Progress toward smoking cessation:  stopped smoking  Barriers to progress toward smoking cessation:     Comments: doing really well, x2 weeks  Plan:  Instruction/counseling given:  Congratulated on efforts  Educational resources provided:     Self management tools provided:     Medications to assist with smoking cessation:  bupropion and patch Patient agreed to the following self-care plans for smoking cessation:  call [facility] (1-800-QUIT-NOW)

## 2012-07-25 NOTE — Assessment & Plan Note (Signed)
Will schedule colonoscopy in May 2014 after jury d[place]

## 2012-07-25 NOTE — Assessment & Plan Note (Addendum)
Educated on 3 cardinal symptoms of COPD exacerbation and if he notices 2+ to call clinic immediately.  Currently has baseline SOB and very infrequent cough with NO sputum, no fever.  No body aches.  Is compliant with his inhalers and gave himself a breathing treatment this morning.  Is on home O2 3L [place] prn.  o2 sat in clinic today on room air 91% and desat to 86% on ambulation.  Does not appear to be in exacerbation at this time.   -nebulizer treatment q6h prn, hold for tachycardia and/or dizziness -prn otc de-congestant -wear [place] o2 at home and on ambulating -continue albuterol, advair, and spiriva inhaler -has been smoke free for 2 weeks!

## 2012-07-30 ENCOUNTER — Ambulatory Visit: Payer: BC Managed Care – PPO | Admitting: Emergency Medicine

## 2012-07-30 ENCOUNTER — Telehealth: Payer: Self-pay | Admitting: Emergency Medicine

## 2012-07-30 NOTE — Telephone Encounter (Signed)
ATC PT line busy x 4 wcb  

## 2012-07-31 MED ORDER — FLUTICASONE-SALMETEROL 500-50 MCG/DOSE IN AEPB: 1.0000 | INHALATION_SPRAY | Freq: Two times a day (BID) | RESPIRATORY_TRACT | Status: DC

## 2012-07-31 MED ORDER — ALBUTEROL SULFATE HFA 108 (90 BASE) MCG/ACT IN AERS: 2.0000 | INHALATION_SPRAY | Freq: Four times a day (QID) | RESPIRATORY_TRACT | Status: DC | PRN

## 2012-07-31 NOTE — Telephone Encounter (Signed)
I have refilled advair and proair LMOM for the pt to be made aware

## 2012-08-30 ENCOUNTER — Ambulatory Visit (INDEPENDENT_AMBULATORY_CARE_PROVIDER_SITE_OTHER): Payer: BC Managed Care – PPO | Admitting: Emergency Medicine

## 2012-08-30 ENCOUNTER — Encounter: Payer: Self-pay | Admitting: Emergency Medicine

## 2012-08-30 VITALS — BP 104/68 | HR 84 | Temp 98.6°F | Ht 67.0 in | Wt 163.0 lb

## 2012-08-30 DIAGNOSIS — J449 Chronic obstructive pulmonary disease, unspecified: Secondary | ICD-10-CM

## 2012-08-30 LAB — PULMONARY FUNCTION TEST

## 2012-08-30 NOTE — Progress Notes (Signed)
PFT done today. 

## 2012-08-30 NOTE — Patient Instructions (Addendum)
Congratulations on quitting smoking! Please continue your Spiriva and Advair as you are taking them Walking oximetry today showed you did not desaturate and we can discontinue your oxygen Use your albuterol as needed Follow with Dr Delton Coombes in 4 months or sooner if you have any problems.

## 2012-08-30 NOTE — Progress Notes (Signed)
Subjective:    Patient ID: Frank [name], male    DOB: 11/18/1955, 57 y.o.   MRN: [id number]  HPI 57 yo smoker admitted 06/05/12 for COPD exacerbation .  PCCM w/ initial consult   06/24/12 -- [facility] follow up  Pt w/ progressive cough, dyspnea and wheezing 4 days PTA . Admitted started on aggressive pulmonary hygiene regimen w/ nebs, steroids and abx.  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  Has FH of ? Lung dz -parent deceased.   Since discharge he is feeling better Less dyspnea and cough  Started on advair and spiriva at discharge Finished steroids taper.  Walking sats in office w/out desat .  Wants to go back to work.  No hemoptysis , chest pain or edema  CXR showed COPD changes .  No previous dx of COPD , no PFT in past.    ROV 08/30/12 -- COPD, seen by me as hosp consult in 1/'14 when he was admitted for AE. Returns today after PFT >> severe AFL, hyperinflation, decreased DLCO. He is on advair + spiriva. Uses albuterol once in the am. He feels that he is getting back to baseline - actually better fxn than before he was hospitalized. He was discharged on 3L/min at all times, he has been using prn. 3 weeks ago he had nasal congestion, treated symptomatically. No wheeze, rare cough, produces clear mucous.    PULMONARY FUNCTON TEST 08/30/2012  FVC 3.93  FEV1 1.69  FEV1/FVC 43  FVC  % Predicted 93  FEV % Predicted 55  FeF 25-75 .71  FeF 25-75 % Predicted 3.05    Review of Systems Constitutional:   No  weight loss, night sweats,  Fevers, chills, fatigue, or  lassitude.  HEENT:   No headaches,  Difficulty swallowing,  Tooth/dental problems, or  Sore throat,                No sneezing, itching, ear ache, nasal congestion, post nasal drip,   CV:  No chest pain,  Orthopnea, PND, swelling in lower extremities, anasarca, dizziness, palpitations, syncope.   GI  No heartburn, indigestion, abdominal pain, nausea, vomiting, diarrhea, change  in bowel habits, loss of appetite, bloody stools.   Resp:   No coughing up of blood.  No change in color of mucus.  No wheezing.  No chest wall deformity  Skin: no rash or lesions.  GU: no dysuria, change in color of urine, no urgency or frequency.  No flank pain, no hematuria   MS:  No joint pain or swelling.  No decreased range of motion.  No back pain.  Psych:  No change in mood or affect. No depression or anxiety.  No memory loss.      Objective:   Physical Exam Filed Vitals:   08/30/12 1333  BP: 104/68  Pulse: 84  Temp: 98.6 F (37 C)  TempSrc: Oral  Height: 5\' 7"  (1.702 m)  Weight: 163 lb (73.936 kg)  SpO2: 94%   GEN: A/Ox3; pleasant , NAD, thin   HEENT:  [place]/AT,  EACs-clear, TMs-wnl, NOSE-clear, THROAT-clear, no lesions, no postnasal drip or exudate noted, missing several teeth   NECK:  Supple w/ fair ROM; no JVD; normal carotid impulses w/o bruits; no thyromegaly or nodules palpated; no lymphadenopathy.  RESP  Clear  P & A; distant but no wheezes/ rales/ or rhonchi.no accessory muscle use  CARD:  RRR, no m/r/g  , no peripheral edema, pulses intact, no  cyanosis or clubbing.  Musco: Warm bil, no deformities or joint swelling noted.   Neuro: alert, no focal deficits noted.    Skin: Warm, no lesions or rashes      Assessment & Plan:  No problem-specific assessment & plan notes found for this encounter.

## 2012-09-05 ENCOUNTER — Encounter: Payer: Self-pay | Admitting: Gastroenterology

## 2012-09-05 ENCOUNTER — Ambulatory Visit (HOSPITAL_COMMUNITY)
Admission: RE | Admit: 2012-09-05 | Discharge: 2012-09-05 | Disposition: A | Discharge status: Home / Self Care | Payer: BC Managed Care – PPO | Source: Ambulatory Visit | Attending: Internal Medicine | Admitting: Internal Medicine

## 2012-09-05 ENCOUNTER — Ambulatory Visit (INDEPENDENT_AMBULATORY_CARE_PROVIDER_SITE_OTHER): Payer: BC Managed Care – PPO | Admitting: Internal Medicine

## 2012-09-05 ENCOUNTER — Encounter: Payer: Self-pay | Admitting: Internal Medicine

## 2012-09-05 VITALS — BP 108/71 | HR 82 | Temp 97.9°F | Ht 68.0 in | Wt 164.6 lb

## 2012-09-05 DIAGNOSIS — F17200 Nicotine dependence, unspecified, uncomplicated: Secondary | ICD-10-CM

## 2012-09-05 DIAGNOSIS — Z720 Tobacco use: Secondary | ICD-10-CM

## 2012-09-05 DIAGNOSIS — M7989 Other specified soft tissue disorders: Secondary | ICD-10-CM | POA: Insufficient documentation

## 2012-09-05 DIAGNOSIS — M79609 Pain in unspecified limb: Secondary | ICD-10-CM | POA: Insufficient documentation

## 2012-09-05 DIAGNOSIS — J449 Chronic obstructive pulmonary disease, unspecified: Secondary | ICD-10-CM

## 2012-09-05 DIAGNOSIS — Z1211 Encounter for screening for malignant neoplasm of colon: Secondary | ICD-10-CM

## 2012-09-05 DIAGNOSIS — M24549 Contracture, unspecified hand: Secondary | ICD-10-CM | POA: Insufficient documentation

## 2012-09-05 NOTE — Patient Instructions (Signed)
1. You will  be scheduled for a colonoscopy with a gastroenterologist 2. We will see her back in 3 months. If you notice any worsening shortness of breath, cough or wheezing which are present for more then 3 days please call the clinic for further evaluation and management.

## 2012-09-05 NOTE — Progress Notes (Signed)
Subjective:   Patient ID: Frank [name] male   DOB: 1956-04-15 57 y.o.   MRN: [id number]  HPI: Mr.Jaquin D [name] is a 57 y.o. male with past medical history significant as outlined below who presented to the clinic for regular followup. Patient was seen by pulmonology on 08/30/12. Recommended to continue Spiriva and Advair. A walking oximetry was obtained which did not show any desaturation. Patient reports that he is doing great without any trouble with his breathing. I'm not using any oxygen.  Today patient was somewhat concerned about his left fifth finger which seems to be contracted. Patient reports this has been present for 6-8 months at least. He woke up suddenly one morning and noticed it. Denies any redness, erythema, pain with it. Does not recall any injuries to the finger. Does not have any other joints involved. He massages it on a regular basis and tries to scratch the finger sometimes it helps sometimes not. He noticed yesterday that it started to have more swelling.   Past Medical History  Diagnosis Date  . Tobacco abuse 06/05/2012  . Obstructive chronic bronchitis with exacerbation 06/07/2012  . Hypoxemia 06/07/2012  . Emphysema 06/07/2012  . COPD (chronic obstructive pulmonary disease) 06/05/2012   Current Outpatient Prescriptions  Medication Sig Dispense Refill  . albuterol (ACCUNEB) 0.63 MG/3ML nebulizer solution Take 1 ampule by nebulization every 6 (six) hours as needed. For cough/wheezing      . albuterol (PROVENTIL HFA;VENTOLIN HFA) 108 (90 BASE) MCG/ACT inhaler Inhale 1-2 puffs into the lungs every 6 (six) hours as needed. For wheezing      . Fluticasone-Salmeterol (ADVAIR) 500-50 MCG/DOSE AEPB Inhale 1 puff into the lungs every 12 (twelve) hours.  60 each  1  . tiotropium (SPIRIVA) 18 MCG inhalation capsule Place 1 capsule (18 mcg total) into inhaler and inhale daily.  30 capsule  12   No current facility-administered medications for this visit.   Family History   Problem Relation Age of Onset  . Colon polyps Father   . Colon polyps Sister    History   Social History  . Marital Status: Single    Spouse Name: N/A    Number of Children: N/A  . Years of Education: N/A   Social History Main Topics  . Smoking status: Former Smoker -- 35 years    Types: Cigarettes    Quit date: 07/06/2012  . Smokeless tobacco: Former Neurosurgeon    Types: Chew    Quit date: 05/08/1968  . Alcohol Use: No  . Drug Use: No  . Sexually Active: No   Other Topics Concern  . None   Social History Narrative  . None   Review of Systems: Constitutional: Denies fever, chills, diaphoresis, appetite change and fatigue.  Respiratory: Denies SOB, DOE, cough, chest tightness,  and wheezing.   Cardiovascular: Denies chest pain, palpitations and leg swelling.  Gastrointestinal: Denies nausea, vomiting, abdominal pain, diarrhea, constipation, blood in stool and abdominal distention.  Genitourinary: Denies dysuria, urgency, frequency, hematuria, flank pain and difficulty urinating.  Skin: Denies pallor, rash and wound.  Neurological: Denies dizziness,  Objective:  Physical Exam: Filed Vitals:   09/05/12 1011  BP: 108/71  Pulse: 82  Temp: 97.9 F (36.6 C)  TempSrc: Oral  Height: 5\' 8"  (1.727 m)  Weight: 164 lb 9.6 oz (74.662 kg)  SpO2: 93%   Constitutional: Vital signs reviewed.  Patient is a well-developed and well-nourished male in no acute distress and cooperative with exam. Alert and oriented x3.  Neck: Supple, Cardiovascular: RRR, S1 normal, S2 normal, no MRG, pulses symmetric and intact bilaterally Pulmonary/Chest: Good air movement throughout. No wheezing notable. Abdominal: Soft. Non-tender, non-distended, bowel sounds are normal, no masses, organomegaly, or guarding present.  Musculoskeletal: Left fifth finger ; flexed in the PIP joint, not able to fully extend, no erythema, normal range of motion except in PIP. No other joints involved. Hematology: no  cervical adenopathy.  Neurological: A&O x3,  Skin: Warm, dry and intact. No rash, cyanosis, or clubbing.

## 2012-09-05 NOTE — Assessment & Plan Note (Signed)
Patient's sister informs me that she and her father had polyps. Her father had multiple in the past. Her posterior for nonmalignant. Patient never had a colonoscopy done. I'll refer patient to gastroenterology for screening colonoscopy at this point.

## 2012-09-05 NOTE — Progress Notes (Signed)
Case discussed with Dr. Illath soon after the resident saw the patient.  We reviewed the resident's history and exam and pertinent patient test results.  I agree with the assessment, diagnosis, and plan of care documented in the resident's note. 

## 2012-09-05 NOTE — Assessment & Plan Note (Signed)
Patient quit smoking since one month ago. He's not using any nicotine patches or Wellbutrin especially since it made him groggy. I emphasized the importance to restart smoking.

## 2012-09-05 NOTE — Assessment & Plan Note (Signed)
Significantly improved. Per Dr. Corliss Blacker continue Spiriva and Advair.

## 2012-09-05 NOTE — Assessment & Plan Note (Signed)
Flexed PIP joint with decreased range of motion of unclear etiology. I will obtain x-ray of the hand. Patient has no history of gout or any other joint involvements.  Patient does not have any symptoms at this point. Will continue to monitor her.

## 2012-09-25 ENCOUNTER — Ambulatory Visit (AMBULATORY_SURGERY_CENTER): Payer: BC Managed Care – PPO | Admitting: *Deleted

## 2012-09-25 ENCOUNTER — Encounter: Payer: Self-pay | Admitting: Gastroenterology

## 2012-09-25 VITALS — Ht 68.0 in | Wt 162.8 lb

## 2012-09-25 DIAGNOSIS — Z1211 Encounter for screening for malignant neoplasm of colon: Secondary | ICD-10-CM

## 2012-09-25 MED ORDER — MOVIPREP 100 G PO SOLR: ORAL | Status: DC

## 2012-10-09 ENCOUNTER — Ambulatory Visit (AMBULATORY_SURGERY_CENTER): Payer: BC Managed Care – PPO | Admitting: Gastroenterology

## 2012-10-09 ENCOUNTER — Encounter: Payer: Self-pay | Admitting: Gastroenterology

## 2012-10-09 VITALS — BP 113/72 | HR 68 | Temp 96.9°F | Resp 12 | Ht 68.0 in | Wt 162.0 lb

## 2012-10-09 DIAGNOSIS — K5730 Diverticulosis of large intestine without perforation or abscess without bleeding: Secondary | ICD-10-CM

## 2012-10-09 DIAGNOSIS — D126 Benign neoplasm of colon, unspecified: Secondary | ICD-10-CM

## 2012-10-09 DIAGNOSIS — Z1211 Encounter for screening for malignant neoplasm of colon: Secondary | ICD-10-CM

## 2012-10-09 MED ORDER — SODIUM CHLORIDE 0.9 % IV SOLN: 500.0000 mL | INTRAVENOUS | Status: DC

## 2012-10-09 NOTE — Progress Notes (Deleted)
YOU HAD AN ENDOSCOPIC PROCEDURE TODAY AT THE [facility]: Refer to the procedure report that was given to you for any specific questions about what was found during the examination.  If the procedure report does not answer your questions, please call your gastroenterologist to clarify.  If you requested that your care partner not be given the details of your procedure findings, then the procedure report has been included in a sealed envelope for you to review at your convenience later.  YOU SHOULD EXPECT: Some feelings of bloating in the abdomen. Passage of more gas than usual.  Walking can help get rid of the air that was put into your GI tract during the procedure and reduce the bloating. If you had a lower endoscopy (such as a colonoscopy or flexible sigmoidoscopy) you may notice spotting of blood in your stool or on the toilet paper. If you underwent a bowel prep for your procedure, then you may not have a normal bowel movement for a few days.  DIET: Your first meal following the procedure should be a light meal and then it is ok to progress to your normal diet.  A half-sandwich or bowl of soup is an example of a good first meal.  Heavy or fried foods are harder to digest and may make you feel nauseous or bloated.  Likewise meals heavy in dairy and vegetables can cause extra gas to form and this can also increase the bloating.  Drink plenty of fluids but you should avoid alcoholic beverages for 24 hours.  ACTIVITY: Your care partner should take you home directly after the procedure.  You should plan to take it easy, moving slowly for the rest of the day.  You can resume normal activity the day after the procedure however you should NOT DRIVE or use heavy machinery for 24 hours (because of the sedation medicines used during the test).    SYMPTOMS TO REPORT IMMEDIATELY: A gastroenterologist can be reached at any hour.  During normal business hours, 8:30 AM to 5:00 PM Monday through Friday,  call [phone].  After hours and on weekends, please call the GI answering service at [phone] who will take a message and have the physician on call contact you.   Following lower endoscopy (colonoscopy or flexible sigmoidoscopy):  Excessive amounts of blood in the stool  Significant tenderness or worsening of abdominal pains  Swelling of the abdomen that is new, acute  Fever of 100F or higher  Following upper endoscopy (EGD)  Vomiting of blood or coffee ground material  New chest pain or pain under the shoulder blades  Painful or persistently difficult swallowing  New shortness of breath  Fever of 100F or higher  Black, tarry-looking stools  FOLLOW UP: If any biopsies were taken you will be contacted by phone or by letter within the next 1-3 weeks.  Call your gastroenterologist if you have not heard about the biopsies in 3 weeks.  Our staff will call the home number listed on your records the next business day following your procedure to check on you and address any questions or concerns that you may have at that time regarding the information given to you following your procedure. This is a courtesy call and so if there is no answer at the home number and we have not heard from you through the emergency physician on call, we will assume that you have returned to your regular daily activities without incident.  SIGNATURES/CONFIDENTIALITY: You and/or your care   partner have signed paperwork which will be entered into your electronic medical record.  These signatures attest to the fact that that the information above on your After Visit Summary has been reviewed and is understood.  Full responsibility of the confidentiality of this discharge information lies with you and/or your care-partner.  

## 2012-10-09 NOTE — Op Note (Signed)
[facility] [address] [place], [address]   COLONOSCOPY PROCEDURE REPORT  PATIENT: Frank [name], Frank [name]  MR#: [id number] BIRTHDATE: 1955-12-20 , 56  yrs. old GENDER: Male ENDOSCOPIST: Mardella Layman, MD, [facility] REFERRED BY: PROCEDURE DATE:  10/09/2012 PROCEDURE:   Colonoscopy with snare polypectomy ASA CLASS:   Class III INDICATIONS:Average risk patient for colon cancer. MEDICATIONS: propofol (Diprivan) 250mg  IV  DESCRIPTION OF PROCEDURE:   After the risks and benefits and of the procedure were explained, informed consent was obtained.  A digital rectal exam revealed no abnormalities of the rectum.    The LB [id number]  endoscope was introduced through the anus and advanced to the cecum, which was identified by both the appendix and ileocecal valve .  The quality of the prep was excellent, using MoviPrep .  The instrument was then slowly withdrawn as the colon was fully examined.     COLON FINDINGS: A fungating sessile polyp ranging between 5-53mm in size was found at the cecum.  A polypectomy was performed using snare cautery.  The resection was complete and the polyp tissue was completely retrieved.   Eight firm flat polyps ranging between 3-36mm in size were found in the sigmoid colon and rectum.  A polypectomy was performed using snare cautery.  The resection was complete and the polyp tissue was partially retrieved. 3 POLYSPS HOT SNARE REMOVED FROM DESCENDIBD COLON.3-5 mm size,only 1 retrieved.  Moderate diverticulosis was noted in the descending colon and sigmoid colon.     Retroflexed views revealed no abnormalities.     The scope was then withdrawn from the patient and the procedure completed.  COMPLICATIONS: There were no complications. ENDOSCOPIC IMPRESSION: 1.   Sessile polyp ranging between 5-68mm in size was found at the cecum; polypectomy was performed using snare cautery 2.   Eight flat polyps ranging between 3-47mm in size were found  in the sigmoid colon and rectum; polypectomy was performed using snare cautery ,3 polyps fro descending colon also excised, 3.   Moderate diverticulosis was noted in the descending colon and sigmoid colon  RECOMMENDATIONS: 1.  Await biopsy results 2.  Continue current medications 3.  Repeat Colonoscopy in 3 years.   REPEAT EXAM: cc Dr. Annamary Rummage cc:  _______________________________ eSigned:  Mardella Layman, MD, [facility] 10/09/2012 9:29 AM     PATIENT NAME:  [name], [name] MR#: [id number]

## 2012-10-09 NOTE — Patient Instructions (Signed)
YOU HAD AN ENDOSCOPIC PROCEDURE TODAY AT THE [facility]: Refer to the procedure report that was given to you for any specific questions about what was found during the examination.  If the procedure report does not answer your questions, please call your gastroenterologist to clarify.  If you requested that your care partner not be given the details of your procedure findings, then the procedure report has been included in a sealed envelope for you to review at your convenience later.  YOU SHOULD EXPECT: Some feelings of bloating in the abdomen. Passage of more gas than usual.  Walking can help get rid of the air that was put into your GI tract during the procedure and reduce the bloating. If you had a lower endoscopy (such as a colonoscopy or flexible sigmoidoscopy) you may notice spotting of blood in your stool or on the toilet paper. If you underwent a bowel prep for your procedure, then you may not have a normal bowel movement for a few days.  DIET: Your first meal following the procedure should be a light meal and then it is ok to progress to your normal diet.  A half-sandwich or bowl of soup is an example of a good first meal.  Heavy or fried foods are harder to digest and may make you feel nauseous or bloated.  Likewise meals heavy in dairy and vegetables can cause extra gas to form and this can also increase the bloating.  Drink plenty of fluids but you should avoid alcoholic beverages for 24 hours.  ACTIVITY: Your care partner should take you home directly after the procedure.  You should plan to take it easy, moving slowly for the rest of the day.  You can resume normal activity the day after the procedure however you should NOT DRIVE or use heavy machinery for 24 hours (because of the sedation medicines used during the test).    SYMPTOMS TO REPORT IMMEDIATELY: A gastroenterologist can be reached at any hour.  During normal business hours, 8:30 AM to 5:00 PM Monday through Friday,  call [phone].  After hours and on weekends, please call the GI answering service at [phone] who will take a message and have the physician on call contact you.   Following lower endoscopy (colonoscopy or flexible sigmoidoscopy):  Excessive amounts of blood in the stool  Significant tenderness or worsening of abdominal pains  Swelling of the abdomen that is new, acute  Fever of 100F or higher    FOLLOW UP: If any biopsies were taken you will be contacted by phone or by letter within the next 1-3 weeks.  Call your gastroenterologist if you have not heard about the biopsies in 3 weeks.  Our staff will call the home number listed on your records the next business day following your procedure to check on you and address any questions or concerns that you may have at that time regarding the information given to you following your procedure. This is a courtesy call and so if there is no answer at the home number and we have not heard from you through the emergency physician on call, we will assume that you have returned to your regular daily activities without incident.  SIGNATURES/CONFIDENTIALITY: You and/or your care partner have signed paperwork which will be entered into your electronic medical record.  These signatures attest to the fact that that the information above on your After Visit Summary has been reviewed and is understood.  Full responsibility of the confidentiality   of this discharge information lies with you and/or your care-partner.     

## 2012-10-09 NOTE — Progress Notes (Signed)
YOU HAD AN ENDOSCOPIC PROCEDURE TODAY AT THE [facility]: Refer to the procedure report that was given to you for any specific questions about what was found during the examination.  If the procedure report does not answer your questions, please call your gastroenterologist to clarify.  If you requested that your care partner not be given the details of your procedure findings, then the procedure report has been included in a sealed envelope for you to review at your convenience later.  YOU SHOULD EXPECT: Some feelings of bloating in the abdomen. Passage of more gas than usual.  Walking can help get rid of the air that was put into your GI tract during the procedure and reduce the bloating. If you had a lower endoscopy (such as a colonoscopy or flexible sigmoidoscopy) you may notice spotting of blood in your stool or on the toilet paper. If you underwent a bowel prep for your procedure, then you may not have a normal bowel movement for a few days.  DIET: Your first meal following the procedure should be a light meal and then it is ok to progress to your normal diet.  A half-sandwich or bowl of soup is an example of a good first meal.  Heavy or fried foods are harder to digest and may make you feel nauseous or bloated.  Likewise meals heavy in dairy and vegetables can cause extra gas to form and this can also increase the bloating.  Drink plenty of fluids but you should avoid alcoholic beverages for 24 hours.  ACTIVITY: Your care partner should take you home directly after the procedure.  You should plan to take it easy, moving slowly for the rest of the day.  You can resume normal activity the day after the procedure however you should NOT DRIVE or use heavy machinery for 24 hours (because of the sedation medicines used during the test).    SYMPTOMS TO REPORT IMMEDIATELY: A gastroenterologist can be reached at any hour.  During normal business hours, 8:30 AM to 5:00 PM Monday through Friday,  call [phone].  After hours and on weekends, please call the GI answering service at [phone] who will take a message and have the physician on call contact you.   Following lower endoscopy (colonoscopy or flexible sigmoidoscopy):  Excessive amounts of blood in the stool  Significant tenderness or worsening of abdominal pains  Swelling of the abdomen that is new, acute  Fever of 100F or higher    FOLLOW UP: If any biopsies were taken you will be contacted by phone or by letter within the next 1-3 weeks.  Call your gastroenterologist if you have not heard about the biopsies in 3 weeks.  Our staff will call the home number listed on your records the next business day following your procedure to check on you and address any questions or concerns that you may have at that time regarding the information given to you following your procedure. This is a courtesy call and so if there is no answer at the home number and we have not heard from you through the emergency physician on call, we will assume that you have returned to your regular daily activities without incident.  SIGNATURES/CONFIDENTIALITY: You and/or your care partner have signed paperwork which will be entered into your electronic medical record.  These signatures attest to the fact that that the information above on your After Visit Summary has been reviewed and is understood.  Full responsibility of the confidentiality   of this discharge information lies with you and/or your care-partner.     

## 2012-10-09 NOTE — Progress Notes (Signed)
YOU HAD AN ENDOSCOPIC PROCEDURE TODAY AT THE [facility]: Refer to the procedure report that was given to you for any specific questions about what was found during the examination.  If the procedure report does not answer your questions, please call your gastroenterologist to clarify.  If you requested that your care partner not be given the details of your procedure findings, then the procedure report has been included in a sealed envelope for you to review at your convenience later.  YOU SHOULD EXPECT: Some feelings of bloating in the abdomen. Passage of more gas than usual.  Walking can help get rid of the air that was put into your GI tract during the procedure and reduce the bloating. If you had a lower endoscopy (such as a colonoscopy or flexible sigmoidoscopy) you may notice spotting of blood in your stool or on the toilet paper. If you underwent a bowel prep for your procedure, then you may not have a normal bowel movement for a few days.  DIET: Your first meal following the procedure should be a light meal and then it is ok to progress to your normal diet.  A half-sandwich or bowl of soup is an example of a good first meal.  Heavy or fried foods are harder to digest and may make you feel nauseous or bloated.  Likewise meals heavy in dairy and vegetables can cause extra gas to form and this can also increase the bloating.  Drink plenty of fluids but you should avoid alcoholic beverages for 24 hours.  ACTIVITY: Your care partner should take you home directly after the procedure.  You should plan to take it easy, moving slowly for the rest of the day.  You can resume normal activity the day after the procedure however you should NOT DRIVE or use heavy machinery for 24 hours (because of the sedation medicines used during the test).    SYMPTOMS TO REPORT IMMEDIATELY: A gastroenterologist can be reached at any hour.  During normal business hours, 8:30 AM to 5:00 PM Monday through Friday,  call [phone].  After hours and on weekends, please call the GI answering service at [phone] who will take a message and have the physician on call contact you.   Following lower endoscopy (colonoscopy or flexible sigmoidoscopy):  Excessive amounts of blood in the stool  Significant tenderness or worsening of abdominal pains  Swelling of the abdomen that is new, acute  Fever of 100F or higher    FOLLOW UP: If any biopsies were taken you will be contacted by phone or by letter within the next 1-3 weeks.  Call your gastroenterologist if you have not heard about the biopsies in 3 weeks.  Our staff will call the home number listed on your records the next business day following your procedure to check on you and address any questions or concerns that you may have at that time regarding the information given to you following your procedure. This is a courtesy call and so if there is no answer at the home number and we have not heard from you through the emergency physician on call, we will assume that you have returned to your regular daily activities without incident.  SIGNATURES/CONFIDENTIALITY: You and/or your care partner have signed paperwork which will be entered into your electronic medical record.  These signatures attest to the fact that that the information above on your After Visit Summary has been reviewed and is understood.  Full responsibility of the confidentiality   of this discharge information lies with you and/or your care-partner.     

## 2012-10-09 NOTE — Progress Notes (Signed)
Patient did not experience any of the following events: a burn prior to discharge; a fall within the facility; wrong site/side/patient/procedure/implant event; or a hospital transfer or hospital admission upon discharge from the facility. (G8907) Patient did not have preoperative order for IV antibiotic SSI prophylaxis. (G8918)  

## 2012-10-09 NOTE — Progress Notes (Signed)
Called to room to assist during endoscopic procedure.  Patient ID and intended procedure confirmed with present staff. Received instructions for my participation in the procedure from the performing physician.  

## 2012-10-10 ENCOUNTER — Telehealth: Payer: Self-pay | Admitting: *Deleted

## 2012-10-10 NOTE — Telephone Encounter (Signed)
  Follow up Call-  Call back number 10/09/2012  Post procedure Call Back phone  # [phone]  Permission to leave phone message Yes     Patient questions:  Do you have a fever, pain , or abdominal swelling? no Pain Score  0 *  Have you tolerated food without any problems? yes  Have you been able to return to your normal activities? yes  Do you have any questions about your discharge instructions: Diet   no Medications  no Follow up visit  no  Do you have questions or concerns about your Care? no  Actions: * If pain score is 4 or above: No action needed, pain <4.

## 2012-10-15 ENCOUNTER — Encounter: Payer: Self-pay | Admitting: Gastroenterology

## 2012-10-21 ENCOUNTER — Telehealth: Payer: Self-pay | Admitting: Emergency Medicine

## 2012-10-21 NOTE — Telephone Encounter (Signed)
General 09/02/2012 9:29 AM COBB, RHONDA J        Note    Order confirmed by Sherene Sires, from [facility] stating that Tamela Oddi is covering for [facility]. Order confirmed received by [facility] to D/C pt's oxygen. Rhonda J Cobb   lmomtcb x1 for [employer]

## 2012-10-22 NOTE — Telephone Encounter (Signed)
Spoke with Melissa with [facility].  Informed her of below order and advised pt still hasn't heard anything about having o2 picked up.  Per Efraim Kaufmann, she will check into this and will have this taken care of. lmomtcb to inform pt

## 2012-10-23 NOTE — Telephone Encounter (Signed)
i spoke with pt and is aware. Nothing further was needed

## 2012-10-23 NOTE — Telephone Encounter (Signed)
Returning call can be reached at [phone].Raylene Everts

## 2012-10-30 ENCOUNTER — Other Ambulatory Visit: Payer: Self-pay | Admitting: Emergency Medicine

## 2012-12-03 ENCOUNTER — Encounter: Payer: Self-pay | Admitting: Internal Medicine

## 2012-12-03 ENCOUNTER — Ambulatory Visit (INDEPENDENT_AMBULATORY_CARE_PROVIDER_SITE_OTHER): Payer: BC Managed Care – PPO | Admitting: Internal Medicine

## 2012-12-03 VITALS — BP 125/77 | HR 74 | Temp 97.0°F | Ht 68.0 in | Wt 167.2 lb

## 2012-12-03 DIAGNOSIS — B029 Zoster without complications: Secondary | ICD-10-CM

## 2012-12-03 MED ORDER — VALACYCLOVIR HCL 1 G PO TABS: 1000.0000 mg | ORAL_TABLET | Freq: Three times a day (TID) | ORAL | Status: AC

## 2012-12-03 NOTE — Patient Instructions (Addendum)
Thank you for your visit - Your rash most likely represents a shingles infection. Your ankle pain is likely part of this. - Please take the antiviral medication Valtrex 1 g, 3 times a day for one week - Return to clinic if the rash is not resolving after completing your medicine

## 2012-12-03 NOTE — Progress Notes (Signed)
I saw and evaluated the patient.  I personally confirmed the key portions of the history and exam documented by Dr. Claudell Kyle and I reviewed pertinent patient test results.  The assessment, diagnosis, and plan were formulated together and I agree with the documentation in the resident's note.  Exam is consistent with shingles.  Patient reports some pain in the affected area on the anterior aspect of the right ankle, but no joint swelling, tenderness, or effusion.

## 2012-12-03 NOTE — Assessment & Plan Note (Addendum)
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 He denies neuralgias, but does report some pain in the ankle region which could be part of the zoster syndrome. No mature vesicles at this time that we could scrape for viral culture. No suggestion of an underlying cellulitis. - Prescribing Valtrex 1 g 3 times a day for 7 days - He will return to clinic for dermatology referral if the rash does not resolve.

## 2012-12-03 NOTE — Progress Notes (Signed)
  Subjective:    Patient ID: Frank [name], male    DOB: 11/07/1955, 57 y.o.   MRN: [id number]  HPI [name] is a 57 y.o. white male with PMH of COPD presenting to clinic today with complaints of rash.  About 3 days ago he developed a rash of "red bumps" on his right lateral leg. It is neither itchy nor painful. However, he does note some soreness in his ankle that began with the rash. He has never had shingles, but he did have chickenpox infection as a child. He cannot remember if the rash had blisters in the beginning of its course. He has never a rash like this before.  The only potential environmental exposure he can think of was last week when he helped a friend sand his hardwood floors. His friend[place] the [place], but he does not remember brushing up against poison ivy or poison oak. Denies using any new detergents, lotions, et Karie Soda. Denies any new medications. Denies fevers or chills.    Review of Systems  Constitutional: Negative for fever and chills.  Respiratory: Negative for shortness of breath.   Cardiovascular: Negative for chest pain.  Musculoskeletal: Negative for myalgias.  Skin: Positive for rash.  Neurological: Negative for weakness and numbness.       Objective:   Physical Exam  Constitutional: He is oriented to person, place, and time. He appears well-developed and well-nourished. No distress.  Neurological: He is alert and oriented to person, place, and time.  Skin: Rash noted. No ecchymosis noted. Rash is maculopapular. Rash is not pustular and not urticarial. He is not diaphoretic. No erythema.     The rash is localized to the right lateral calf with one additional small satellite group on the dorsum of his foot (see graphical documentation). It is maculopapular with 2-3 lesions that have a crust of dried blood. No pustules or vesicles at this time. It is non-linear. Lesions are discrete.          Assessment & Plan:

## 2012-12-11 ENCOUNTER — Ambulatory Visit (INDEPENDENT_AMBULATORY_CARE_PROVIDER_SITE_OTHER): Payer: BC Managed Care – PPO | Admitting: Internal Medicine

## 2012-12-11 VITALS — BP 112/74 | HR 81 | Temp 97.5°F | Ht 68.0 in | Wt 166.7 lb

## 2012-12-11 DIAGNOSIS — F17200 Nicotine dependence, unspecified, uncomplicated: Secondary | ICD-10-CM

## 2012-12-11 DIAGNOSIS — Z720 Tobacco use: Secondary | ICD-10-CM

## 2012-12-11 DIAGNOSIS — B029 Zoster without complications: Secondary | ICD-10-CM

## 2012-12-11 DIAGNOSIS — J449 Chronic obstructive pulmonary disease, unspecified: Secondary | ICD-10-CM

## 2012-12-11 NOTE — Assessment & Plan Note (Signed)
  Assessment: Progress toward smoking cessation:   quit Barriers to progress toward smoking cessation:   none Comments: patient quit earlier this year (around January 2014) and intends to remain tobacco-free  Plan: Instruction/counseling given:  I commended patient for quitting and reviewed strategies for preventing relapses. Educational resources provided:    Self management tools provided:    Medications to assist with smoking cessation:  None Patient agreed to the following self-care plans for smoking cessation:    Other plans: none at this time

## 2012-12-11 NOTE — Progress Notes (Signed)
Patient ID: Frank [name], male   DOB: [birth year], 57 y.o.   MRN: [id number] HPI The patient is a 57 y.o. male with a history of   [name] is a 57 y.o. white male with PMH of COPD presenting to clinic today for follow up of shingles on R lateral leg.   Patient reports resolution of his R leg pain and that the blisters have now all crusted over. He finished the course of valtrex. Denies F/C or new rash elsewhere.   He quit smoking after his hospitalization last January (2014). He reports he has some baseline DOE and cough, but that these symptoms are stable and unchanged recently. He did notice some improvement of his symptoms after quitting smoking. He takes Advair, albuterol inhaler and neb, and tiotropium.   Patient expressed interest in getting the flu shot.  ROS: General: no fevers, chills Skin: residual shingles rash HEENT: no HA or sore throat Pulm: baseline SOB and cough, no recent changes or worsening CV: no chest pain, palpitations Abd: no abdominal pain, nausea/vomiting, diarrhea/constipation GU: no dysuria Ext: no arthralgias, myalgias Neuro: no weakness, numbness, or tingling  Filed Vitals:   12/11/12 1504  BP: 112/74  Pulse: 81  Temp: 97.5 F (36.4 C)    Physical Exam: General: elderly man, alert, pleasant and in no apparent distress HEENT: pupils equal round and reactive to light, vision grossly intact, oropharynx clear and non-erythematous  Neck: supple Lungs: clear to ascultation bilaterally, normal work of respiration, no wheezes, rales, ronchi Heart: regular rate and rhythm, no M/G/R Abdomen: soft, non-tender, non-distended, normal bowel sounds Extremities: warm extremities, no BLE edema- Right lateral lower leg with multiple discrete skin colored macules and papules with overlying dark Pons crust; no tenderness to palpation; no allodynia or other paresthesia with light touch Neurologic: alert & oriented X3, cranial nerves grossly intact, strength  grossly intact, sensation intact to light touch  Current Outpatient Prescriptions on File Prior to Visit  Medication Sig Dispense Refill  . ADVAIR DISKUS 500-50 MCG/DOSE AEPB INHALE 1 PUFF INTO THE LUNGS EVERY 12 (TWELVE) HOURS.  60 each  3  . albuterol (ACCUNEB) 0.63 MG/3ML nebulizer solution Take 1 ampule by nebulization every 6 (six) hours as needed. For cough/wheezing      . albuterol (PROVENTIL HFA;VENTOLIN HFA) 108 (90 BASE) MCG/ACT inhaler Inhale 1-2 puffs into the lungs every 6 (six) hours as needed. For wheezing      . tiotropium (SPIRIVA) 18 MCG inhalation capsule Place 1 capsule (18 mcg total) into inhaler and inhale daily.  30 capsule  12   No current facility-administered medications on file prior to visit.    Assessment/Plan

## 2012-12-11 NOTE — Patient Instructions (Signed)
General Instructions: Thank you for coming in for a visit today. It was a pleasure meeting you.   At the visit we discussed the following- -smoking cessation maintenance- you are doing well since quitting, keep it up -resolution of shingles- the scabs should resolve on their own over the next few weeks. If you have increasing pain or notice a similar rash, please return to clinic.    Treatment Goals:  Goals (1 Years of Data) as of 12/11/12         09/05/12 07/25/12 07/15/12 06/18/12     Lifestyle    . Quit smoking / using tobacco  Yes Yes Yes No      Progress Toward Treatment Goals:  Treatment Goal 07/25/2012  Stop smoking stopped smoking    Self Care Goals & Plans:  Self Care Goal 07/25/2012  Stop smoking call [facility] (1-800-QUIT-NOW)       Care Management & Community Referrals:   none at this time

## 2012-12-11 NOTE — Assessment & Plan Note (Signed)
Patient presented last week with Shingles on R lateral lower extremity. This rash progressed through a vesicular phase with mild pain to R ankle last week. Patient finished his Valtrex and lesions are currently crusted over with no active lesions. Patient has no pain currently. -instructed patient to return to clinic if his pain worsens or if he notices a similar rash on other body parts  Patient inquired about flu shot-- informed him to come into clinic approx Sept-Oct to receive this prior to his next scheduled follow up visit (3 months).

## 2012-12-11 NOTE — Assessment & Plan Note (Signed)
Continue current regimen

## 2012-12-12 NOTE — Progress Notes (Signed)
I saw and evaluated the patient.  I personally confirmed the key portions of the history and exam documented by Dr. Chikowski and I reviewed pertinent patient test results.  The assessment, diagnosis, and plan were formulated together and I agree with the documentation in the resident's note. 

## 2012-12-30 ENCOUNTER — Other Ambulatory Visit: Payer: Self-pay | Admitting: Emergency Medicine

## 2012-12-31 ENCOUNTER — Ambulatory Visit (INDEPENDENT_AMBULATORY_CARE_PROVIDER_SITE_OTHER): Payer: BC Managed Care – PPO | Admitting: Emergency Medicine

## 2012-12-31 ENCOUNTER — Encounter: Payer: Self-pay | Admitting: Emergency Medicine

## 2012-12-31 VITALS — BP 128/74 | Ht 66.0 in | Wt 166.4 lb

## 2012-12-31 DIAGNOSIS — Z720 Tobacco use: Secondary | ICD-10-CM

## 2012-12-31 DIAGNOSIS — F17200 Nicotine dependence, unspecified, uncomplicated: Secondary | ICD-10-CM

## 2012-12-31 DIAGNOSIS — J449 Chronic obstructive pulmonary disease, unspecified: Secondary | ICD-10-CM

## 2012-12-31 NOTE — Assessment & Plan Note (Signed)
-   congratulated his cessation

## 2012-12-31 NOTE — Patient Instructions (Addendum)
Please continue your Spiriva and Advair as you are taking them  Use Albuterol as needed for shortness of breath Get your flu shot this Fall Your pneumonia shot is up to date Follow with Dr Delton Coombes in 4 months or sooner if you have any problems.

## 2012-12-31 NOTE — Progress Notes (Signed)
Subjective:    Patient ID: Frank [name], male    DOB: October 14, 1955, 57 y.o.   MRN: [id number]  HPI 57 yo smoker admitted 06/05/12 for COPD exacerbation .  PCCM w/ initial consult   06/24/12 -- [facility] follow up  Pt w/ progressive cough, dyspnea and wheezing 4 days PTA . Admitted started on aggressive pulmonary hygiene regimen w/ nebs, steroids and abx.  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  Has FH of ? Lung dz -parent deceased.   Since discharge he is feeling better Less dyspnea and cough  Started on advair and spiriva at discharge Finished steroids taper.  Walking sats in office w/out desat .  Wants to go back to work.  No hemoptysis , chest pain or edema  CXR showed COPD changes .  No previous dx of COPD , no PFT in past.    ROV 08/30/12 -- COPD, seen by me as hosp consult in 1/'14 when he was admitted for AE. Returns today after PFT >> severe AFL, hyperinflation, decreased DLCO. He is on advair + spiriva. Uses albuterol once in the am. He feels that he is getting back to baseline - actually better fxn than before he was hospitalized. He was discharged on 3L/min at all times, he has been using prn. 3 weeks ago he had nasal congestion, treated symptomatically. No wheeze, rare cough, produces clear mucous.   ROV 12/31/12 -- follow up for GOLD b COPD, managed on Spiriva + Advair. He feels much better. Has NOT restarted smoking. He used to average 1 AE a year. He is UTD on pneumovax. His functional capacity is good w walking, but some limitation with heavier work. 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 No wheeze.    PULMONARY FUNCTON TEST 08/30/2012  FVC 3.93  FEV1 1.69  FEV1/FVC 43  FVC  % Predicted 93  FEV % Predicted 55  FeF 25-75 .71  FeF 25-75 % Predicted 3.05    Review of Systems Constitutional:   No  weight loss, night sweats,  Fevers, chills, fatigue, or  lassitude.  HEENT:   No headaches,   Difficulty swallowing,  Tooth/dental problems, or  Sore throat,                No sneezing, itching, ear ache, nasal congestion, post nasal drip,   CV:  No chest pain,  Orthopnea, PND, swelling in lower extremities, anasarca, dizziness, palpitations, syncope.   GI  No heartburn, indigestion, abdominal pain, nausea, vomiting, diarrhea, change in bowel habits, loss of appetite, bloody stools.   Resp:   No coughing up of blood.  No change in color of mucus.  No wheezing.  No chest wall deformity  Skin: no rash or lesions.  GU: no dysuria, change in color of urine, no urgency or frequency.  No flank pain, no hematuria   MS:  No joint pain or swelling.  No decreased range of motion.  No back pain.  Psych:  No change in mood or affect. No depression or anxiety.  No memory loss.      Objective:   Physical Exam Filed Vitals:   12/31/12 1332  BP: 128/74  Height: 5\' 6"  (1.676 m)  Weight: 166 lb 6.4 oz (75.479 kg)   GEN: A/Ox3; pleasant , NAD, thin   HEENT:  [place]/AT,  EACs-clear, TMs-wnl, NOSE-clear, THROAT-clear, no lesions, no postnasal drip or exudate noted, missing several teeth  NECK:  Supple w/ fair ROM; no JVD; normal carotid impulses w/o bruits; no thyromegaly or nodules palpated; no lymphadenopathy.  RESP  Clear  P & A; distant but no wheezes/ rales/ or rhonchi.no accessory muscle use  CARD:  RRR, no m/r/g  , no peripheral edema, pulses intact, no cyanosis or clubbing.  Musco: Warm bil, no deformities or joint swelling noted.   Neuro: alert, no focal deficits noted.    Skin: Warm, no lesions or rashes      Assessment & Plan:  Tobacco abuse - congratulated his cessation  COPD (chronic obstructive pulmonary disease) Stable on current regimen, continue same pneumovax was 1/'14 Flu shot this fall  rov 4

## 2012-12-31 NOTE — Assessment & Plan Note (Signed)
Stable on current regimen, continue same pneumovax was 1/'14 Flu shot this fall  rov 4

## 2013-01-03 ENCOUNTER — Other Ambulatory Visit: Payer: Self-pay | Admitting: Emergency Medicine

## 2013-01-29 ENCOUNTER — Ambulatory Visit (INDEPENDENT_AMBULATORY_CARE_PROVIDER_SITE_OTHER): Payer: BC Managed Care – PPO | Admitting: *Deleted

## 2013-01-29 DIAGNOSIS — Z23 Encounter for immunization: Secondary | ICD-10-CM

## 2013-03-12 ENCOUNTER — Encounter: Payer: Self-pay | Admitting: Internal Medicine

## 2013-03-12 ENCOUNTER — Ambulatory Visit (INDEPENDENT_AMBULATORY_CARE_PROVIDER_SITE_OTHER): Payer: BC Managed Care – PPO | Admitting: Internal Medicine

## 2013-03-12 VITALS — BP 126/79 | HR 79 | Temp 97.6°F | Ht 68.0 in | Wt 168.9 lb

## 2013-03-12 DIAGNOSIS — F17200 Nicotine dependence, unspecified, uncomplicated: Secondary | ICD-10-CM

## 2013-03-12 DIAGNOSIS — J449 Chronic obstructive pulmonary disease, unspecified: Secondary | ICD-10-CM

## 2013-03-12 DIAGNOSIS — Z299 Encounter for prophylactic measures, unspecified: Secondary | ICD-10-CM

## 2013-03-12 DIAGNOSIS — Z720 Tobacco use: Secondary | ICD-10-CM

## 2013-03-12 NOTE — Patient Instructions (Signed)
Due for your visit today. Please followup with me in 6 months. He has problems either this appointment please call our clinic and schedule an appointment.   Today we made no changes to your medication regimen; please continue taking all the medications as prescribed.  Congratulations on quitting smoking!  Today we discussed the possibility of low-dose CT scan for screening of lung cancer. We will discuss this at your next visit if this is something you would like to pursue. I do not believe our facility performs this screening, however we can find a facility that does if you so desire.

## 2013-03-12 NOTE — Assessment & Plan Note (Signed)
Patient has a 80-pack-year smoking history and has significant COPD with an FEV1/FEC of 43%. However patient does not complaining of shortness of breath, chest pain, cough. He has not been sick recently. He uses his albuterol inhaler approximately one to 2 times daily, he also uses his Spiriva and Advair as prescribed. His last COPD exacerbation was last January when he was hospitalized. He works as a Copy or 6 days a week and has no trouble with shortness of breath. He denies fevers chills, night sweats, weight loss. His appetite is good and objective weight is stable. I discussed with patient the possibility of doing a screening low-dose CT scan for lung cancer. I do not know if our facility performs these screenings, however I can look into it before our next visit. Patient would like to think about this, he is not ready to make up his mind today. He will followup with me in 6 months and we will address it again then.

## 2013-03-12 NOTE — Progress Notes (Signed)
Patient ID: Frank [name], male   DOB: [birth year], 56 y.o.   MRN: [id number] HPI The patient is a 57 y.o. male with a history of COPD, tobacco abuse who presents for a routine clinic visit.    ROS: General: no fevers, chills, changes in weight, changes in appetite Skin: no rash HEENT: no blurry vision, hearing changes, sore throat Pulm: no dyspnea, coughing, wheezing CV: no chest pain, palpitations, shortness of breath Abd: no abdominal pain, nausea/vomiting, diarrhea/constipation GU: no dysuria, hematuria, polyuria Ext: no arthralgias, myalgias Neuro: no weakness, numbness, or tingling  There were no vitals filed for this visit.  PEX General: alert, cooperative, and in no apparent distress HEENT: pupils equal round and reactive to light, vision grossly intact, oropharynx clear and non-erythematous  Neck: supple, no lymphadenopathy, JVD, or carotid bruits Lungs: clear to ascultation bilaterally, normal work of respiration, no wheezes, rales, ronchi Heart: regular rate and rhythm, no murmurs, gallops, or rubs Abdomen: soft, non-tender, non-distended, normal bowel sounds Extremities: 2+ DP/PT pulses bilaterally, no cyanosis, clubbing, or edema Neurologic: alert & oriented X3, cranial nerves II-XII intact, strength grossly intact, sensation intact to light touch  Current Outpatient Prescriptions on File Prior to Visit  Medication Sig Dispense Refill  . ADVAIR DISKUS 500-50 MCG/DOSE AEPB INHALE 1 PUFF INTO THE LUNGS EVERY 12 (TWELVE) HOURS.  60 each  3  . albuterol (ACCUNEB) 0.63 MG/3ML nebulizer solution Take 1 ampule by nebulization every 6 (six) hours as needed. For cough/wheezing      . tiotropium (SPIRIVA) 18 MCG inhalation capsule Place 1 capsule (18 mcg total) into inhaler and inhale daily.  30 capsule  12  . VENTOLIN HFA 108 (90 BASE) MCG/ACT inhaler INHALE 2 PUFFS INTO THE LUNGS EVERY 6 (SIX) HOURS AS NEEDED.  18 each  3  . VENTOLIN HFA 108 (90 BASE) MCG/ACT inhaler INHALE 2  PUFFS INTO THE LUNGS EVERY 6 (SIX) HOURS AS NEEDED.  18 each  3   No current facility-administered medications on file prior to visit.    Assessment/Plan

## 2013-03-12 NOTE — Assessment & Plan Note (Signed)
The patient received a colonoscopy back in June 2014 at which time he had multiple hyperplastic polyps removed as well as one tubular adenoma. Per GIs note, patient will need another followup colonoscopy in 3 years.

## 2013-03-12 NOTE — Assessment & Plan Note (Signed)
Patient is no longer smoking. He quit cold [place] in February 2014. I congratulated patient on this.

## 2013-03-14 NOTE — Progress Notes (Signed)
I saw and evaluated the patient.  I personally confirmed the key portions of the history and exam documented by Dr. Chikowski and I reviewed pertinent patient test results.  The assessment, diagnosis, and plan were formulated together and I agree with the documentation in the resident's note. 

## 2013-05-13 ENCOUNTER — Other Ambulatory Visit: Payer: Self-pay | Admitting: Emergency Medicine

## 2013-05-13 ENCOUNTER — Ambulatory Visit (INDEPENDENT_AMBULATORY_CARE_PROVIDER_SITE_OTHER): Payer: BC Managed Care – PPO | Admitting: Emergency Medicine

## 2013-05-13 ENCOUNTER — Encounter: Payer: Self-pay | Admitting: Emergency Medicine

## 2013-05-13 VITALS — BP 130/82 | HR 92 | Ht 68.0 in | Wt 162.8 lb

## 2013-05-13 DIAGNOSIS — J449 Chronic obstructive pulmonary disease, unspecified: Secondary | ICD-10-CM

## 2013-05-13 MED ORDER — FLUTICASONE-SALMETEROL 500-50 MCG/DOSE IN AEPB: INHALATION_SPRAY | RESPIRATORY_TRACT | Status: DC

## 2013-05-13 MED ORDER — TIOTROPIUM BROMIDE MONOHYDRATE 18 MCG IN CAPS: 18.0000 ug | ORAL_CAPSULE | Freq: Every day | RESPIRATORY_TRACT | Status: DC

## 2013-05-13 MED ORDER — ALBUTEROL SULFATE HFA 108 (90 BASE) MCG/ACT IN AERS: INHALATION_SPRAY | RESPIRATORY_TRACT | Status: DC

## 2013-05-13 NOTE — Progress Notes (Signed)
Subjective:    Patient ID: Frank [name], male    DOB: [birth year], 58 y.o.   MRN: [id number]  HPI 58 yo smoker admitted 06/05/12 for COPD exacerbation .  PCCM w/ initial consult   06/24/12 -- [facility] follow up  Pt w/ progressive cough, dyspnea and wheezing 4 days PTA . Admitted started on aggressive pulmonary hygiene regimen w/ nebs, steroids and abx.  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  Has FH of ? Lung dz -parent deceased.   Since discharge he is feeling better Less dyspnea and cough  Started on advair and spiriva at discharge Finished steroids taper.  Walking sats in office w/out desat .  Wants to go back to work.  No hemoptysis , chest pain or edema  CXR showed COPD changes .  No previous dx of COPD , no PFT in past.    ROV 08/30/12 -- COPD, seen by me as hosp consult in 1/'14 when he was admitted for AE. Returns today after PFT >> severe AFL, hyperinflation, decreased DLCO. He is on advair + spiriva. Uses albuterol once in the am. He feels that he is getting back to baseline - actually better fxn than before he was hospitalized. He was discharged on 3L/min at all times, he has been using prn. 3 weeks ago he had nasal congestion, treated symptomatically. No wheeze, rare cough, produces clear mucous.   ROV 12/31/12 -- follow up for GOLD b COPD, managed on Spiriva + Advair. He feels much better. Has NOT restarted smoking. He used to average 1 AE a year. He is UTD on pneumovax. His functional capacity is good w walking, but some limitation with heavier work. 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 No wheeze.   ROV 05/14/13 -- follow up for GOLD b COPD, managed on Spiriva + Advair. He has been doing well, no flares since last visit. He has been compliant with his inhaled meds. He rarely has exertional limitation - only w heavy exercise. No wheeze or cough. Uses ventolin most mornings, sometimes twice a day.      PULMONARY FUNCTON TEST 08/30/2012  FVC 3.93  FEV1 1.69  FEV1/FVC 43  FVC  % Predicted 93  FEV % Predicted 55  FeF 25-75 .71  FeF 25-75 % Predicted 3.05    Review of Systems Constitutional:   No  weight loss, night sweats,  Fevers, chills, fatigue, or  lassitude.  HEENT:   No headaches,  Difficulty swallowing,  Tooth/dental problems, or  Sore throat,                No sneezing, itching, ear ache, nasal congestion, post nasal drip,   CV:  No chest pain,  Orthopnea, PND, swelling in lower extremities, anasarca, dizziness, palpitations, syncope.   GI  No heartburn, indigestion, abdominal pain, nausea, vomiting, diarrhea, change in bowel habits, loss of appetite, bloody stools.   Resp:   No coughing up of blood.  No change in color of mucus.  No wheezing.  No chest wall deformity  Skin: no rash or lesions.  GU: no dysuria, change in color of urine, no urgency or frequency.  No flank pain, no hematuria   MS:  No joint pain or swelling.  No decreased range of motion.  No back pain.  Psych:  No change in mood or affect. No depression or anxiety.  No memory loss.      Objective:  Physical Exam Filed Vitals:   05/13/13 1414  BP: 130/82  Pulse: 92  Height: 5\' 8"  (1.727 m)  Weight: 162 lb 12.8 oz (73.846 kg)  SpO2: 96%   GEN: A/Ox3; pleasant , NAD, thin   HEENT:  [place]/AT,  EACs-clear, TMs-wnl, NOSE-clear, THROAT-clear, no lesions, no postnasal drip or exudate noted, missing several teeth   NECK:  Supple w/ fair ROM; no JVD; normal carotid impulses w/o bruits; no thyromegaly or nodules palpated; no lymphadenopathy.  RESP  Clear  P & A; distant but no wheezes/ rales/ or rhonchi.no accessory muscle use  CARD:  RRR, no m/r/g  , no peripheral edema, pulses intact, no cyanosis or clubbing.  Musco: Warm bil, no deformities or joint swelling noted.   Neuro: alert, no focal deficits noted.    Skin: Warm, no lesions or rashes      Assessment & Plan:  COPD (chronic  obstructive pulmonary disease) Has been doing well. No flares.  - continue same regimen - congratulated smoking cessation.  - rov 6

## 2013-05-13 NOTE — Assessment & Plan Note (Signed)
Has been doing well. No flares.  - continue same regimen - congratulated smoking cessation.  - rov 6

## 2013-05-13 NOTE — Patient Instructions (Signed)
Please continue your medications as you have been taking them  Follow with Dr Alandra Sando in 6 months or sooner if you have any problems  

## 2013-05-16 ENCOUNTER — Other Ambulatory Visit: Payer: Self-pay | Admitting: Emergency Medicine

## 2013-06-29 ENCOUNTER — Other Ambulatory Visit (HOSPITAL_COMMUNITY): Payer: Self-pay | Admitting: Internal Medicine

## 2013-06-30 NOTE — Telephone Encounter (Signed)
Got year's supply on 05/13/13

## 2013-07-02 ENCOUNTER — Telehealth: Payer: Self-pay | Admitting: Emergency Medicine

## 2013-07-02 ENCOUNTER — Other Ambulatory Visit (HOSPITAL_COMMUNITY): Payer: Self-pay | Admitting: Internal Medicine

## 2013-07-02 NOTE — Telephone Encounter (Signed)
Spoke with pt.  Pt states he just spoke with [facility] and was advised they do have the Spiriva rx and will get medication ready.  Pt states nothing further is needed.

## 2013-07-02 NOTE — Telephone Encounter (Signed)
rx was sent into [facility] 05/13/13 w/ 12 refills for spiriva. atc pt na. Line rings several times and no VM. WCB

## 2013-07-29 ENCOUNTER — Other Ambulatory Visit (HOSPITAL_COMMUNITY): Payer: Self-pay | Admitting: Internal Medicine

## 2013-09-08 ENCOUNTER — Encounter: Payer: BC Managed Care – PPO | Admitting: Internal Medicine

## 2013-09-30 ENCOUNTER — Ambulatory Visit (INDEPENDENT_AMBULATORY_CARE_PROVIDER_SITE_OTHER): Payer: BC Managed Care – PPO | Admitting: Licensed Clinical Social Worker

## 2013-09-30 DIAGNOSIS — F4323 Adjustment disorder with mixed anxiety and depressed mood: Secondary | ICD-10-CM

## 2013-10-14 ENCOUNTER — Ambulatory Visit (INDEPENDENT_AMBULATORY_CARE_PROVIDER_SITE_OTHER): Payer: BC Managed Care – PPO | Admitting: Licensed Clinical Social Worker

## 2013-10-14 DIAGNOSIS — F4323 Adjustment disorder with mixed anxiety and depressed mood: Secondary | ICD-10-CM

## 2013-10-20 ENCOUNTER — Encounter: Payer: Self-pay | Admitting: Internal Medicine

## 2013-10-20 ENCOUNTER — Ambulatory Visit (INDEPENDENT_AMBULATORY_CARE_PROVIDER_SITE_OTHER): Payer: BC Managed Care – PPO | Admitting: Internal Medicine

## 2013-10-20 VITALS — BP 123/75 | HR 82 | Temp 98.2°F | Ht 67.0 in | Wt 158.3 lb

## 2013-10-20 DIAGNOSIS — Z720 Tobacco use: Secondary | ICD-10-CM

## 2013-10-20 DIAGNOSIS — Z299 Encounter for prophylactic measures, unspecified: Secondary | ICD-10-CM

## 2013-10-20 DIAGNOSIS — J449 Chronic obstructive pulmonary disease, unspecified: Secondary | ICD-10-CM

## 2013-10-20 DIAGNOSIS — F17200 Nicotine dependence, unspecified, uncomplicated: Secondary | ICD-10-CM

## 2013-10-20 MED ORDER — TIOTROPIUM BROMIDE MONOHYDRATE 18 MCG IN CAPS: 18.0000 ug | ORAL_CAPSULE | Freq: Every day | RESPIRATORY_TRACT | Status: DC

## 2013-10-20 MED ORDER — FLUTICASONE-SALMETEROL 500-50 MCG/DOSE IN AEPB: INHALATION_SPRAY | RESPIRATORY_TRACT | Status: DC

## 2013-10-20 MED ORDER — ALBUTEROL SULFATE HFA 108 (90 BASE) MCG/ACT IN AERS: INHALATION_SPRAY | RESPIRATORY_TRACT | Status: DC

## 2013-10-20 NOTE — Patient Instructions (Signed)
Thank you for your visit.   You are doing well. Please follow up with me in 6 months or sooner if necessary.

## 2013-10-20 NOTE — Progress Notes (Signed)
Patient ID: Frank [name], male   DOB: Sep 17, 1955, 58 y.o.   MRN: [id number] HPI The patient is a 58 y.o. male with a history of COPD and tobacco abuse who presents for a routine clinic visit.  Patient was hospitalized for COPD exacerbation in Jan 2014. Since this time patient has been doing well. He has some chronic baseline DOE, though no SOB at rest. Denies cough, sputum production, chest pain, weight loss. He reports decreased appetite, but this happens every year to him when the weather is hot. He takes his albuterol inhaler at most once per week. He takes Advair and Spiriva and needs refills today. He has an albuterol nebulizer at home, but has not used this for over 1 year. He has been seen by Pulmonology, [name] (last seen 05/2013). Of note, he quit smoking in 06/2012.  No other complaints.   ROS: General: +appetite less in hot months; no fevers, chills, changes in weight HEENT: no vision changes, ST Pulm: some DOE, no cough or sputum production  CV: no chest pain, palpitations Abd: no abdominal pain, nausea/vomiting, diarrhea GU: no dysuria Ext: +intermittent b/l shoulders and R knee; occasional low back pain  Neuro: no weakness  Filed Vitals:   10/20/13 1545  BP: 123/75  Pulse: 82  Temp: 98.2 F (36.8 C)  WT 158lbs SpO2 95% R/A  Physical Exam General: alert, cooperative, and in no apparent distress HEENT: NCAT, vision grossly intact, MMM  Lungs: clear to ascultation bilaterally, normal work of respiration, no wheezes, rales, ronchi Heart: regular rate and rhythm, no murmurs, gallops, or rubs Abdomen: soft, non-tender, non-distended, normal bowel sounds Extremities: warm b/l, no pedal edema Neurologic: alert & oriented X3, grossly nonfocal; gait intact  Medications  Albuterol nebulizer Albuterol inhaler 2 puffs q6h prn Spiriva 78mcg capsule into inhaler daily Advair discus 500-61mcg/dose 1 puff every 12 hours  Assessment/Plan See problem based charting for A/P

## 2013-10-20 NOTE — Assessment & Plan Note (Signed)
Patient's breathing is doing well. Only some mild DOE, worse in this hot weather. He uses rescue inhaler at most once per day. Also compliant w/ advair and spiriva. Does not require albuterol nebulizer. Continue current medications. No changes to his management today. We did discuss the possibility of doing a low dose CT scan for screening of lung cancer, but pt still has not thought about this since our last visit. He would like to think about this and address it at his follow up visit in 6 months. Of note, weight is stable and pt denies B symptoms.

## 2013-10-22 NOTE — Progress Notes (Signed)
Case discussed with Dr. Chikowski at the time of the visit.  We reviewed the resident's history and exam and pertinent patient test results.  I agree with the assessment, diagnosis, and plan of care documented in the resident's note. 

## 2013-11-04 ENCOUNTER — Ambulatory Visit (INDEPENDENT_AMBULATORY_CARE_PROVIDER_SITE_OTHER): Payer: BC Managed Care – PPO | Admitting: Licensed Clinical Social Worker

## 2013-11-04 DIAGNOSIS — F4323 Adjustment disorder with mixed anxiety and depressed mood: Secondary | ICD-10-CM

## 2013-11-13 ENCOUNTER — Ambulatory Visit (INDEPENDENT_AMBULATORY_CARE_PROVIDER_SITE_OTHER): Payer: BC Managed Care – PPO | Admitting: Emergency Medicine

## 2013-11-13 ENCOUNTER — Encounter: Payer: Self-pay | Admitting: Emergency Medicine

## 2013-11-13 VITALS — BP 130/88 | HR 84 | Ht 66.0 in | Wt 158.4 lb

## 2013-11-13 DIAGNOSIS — R0602 Shortness of breath: Secondary | ICD-10-CM

## 2013-11-13 DIAGNOSIS — J449 Chronic obstructive pulmonary disease, unspecified: Secondary | ICD-10-CM

## 2013-11-13 DIAGNOSIS — Z23 Encounter for immunization: Secondary | ICD-10-CM

## 2013-11-13 NOTE — Progress Notes (Signed)
Subjective:    Patient ID: Frank [name], male    DOB: 1956/03/10, 58 y.o.   MRN: [id number]  HPI 58 yo smoker admitted 06/05/12 for COPD exacerbation .  PCCM w/ initial consult   06/24/12 -- [facility] follow up  Pt w/ progressive cough, dyspnea and wheezing 4 days PTA . Admitted started on aggressive pulmonary hygiene regimen w/ nebs, steroids and abx.  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  Has FH of ? Lung dz -parent deceased.   Since discharge he is feeling better Less dyspnea and cough  Started on advair and spiriva at discharge Finished steroids taper.  Walking sats in office w/out desat .  Wants to go back to work.  No hemoptysis , chest pain or edema  CXR showed COPD changes .  No previous dx of COPD , no PFT in past.    ROV 08/30/12 -- COPD, seen by me as hosp consult in 1/'14 when he was admitted for AE. Returns today after PFT >> severe AFL, hyperinflation, decreased DLCO. He is on advair + spiriva. Uses albuterol once in the am. He feels that he is getting back to baseline - actually better fxn than before he was hospitalized. He was discharged on 3L/min at all times, he has been using prn. 3 weeks ago he had nasal congestion, treated symptomatically. No wheeze, rare cough, produces clear mucous.   ROV 12/31/12 -- follow up for GOLD b COPD, managed on Spiriva + Advair. He feels much better. Has NOT restarted smoking. He used to average 1 AE a year. He is UTD on pneumovax. His functional capacity is good w walking, but some limitation with heavier work. 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 No wheeze.   ROV 05/14/13 -- follow up for GOLD b COPD, managed on Spiriva + Advair. He has been doing well, no flares since last visit. He has been compliant with his inhaled meds. He rarely has exertional limitation - only w heavy exercise. No wheeze or cough. Uses ventolin most mornings, sometimes twice a day.    ROV 11/13/13 -- follow up for GOLD c COPD, managed on Spiriva + Advair. He returns for a regular f/u. He believes that his breathing is close to the same, but he also describes needing to rest with carrying laundry, difficulty in the humidity of the shower. He wonders about whether he should try to get disability.  He no longer works, but he is active.     PULMONARY FUNCTON TEST 08/30/2012  FVC 3.93  FEV1 1.69  FEV1/FVC 43  FVC  % Predicted 93  FEV % Predicted 55  FeF 25-75 .71  FeF 25-75 % Predicted 3.05    Review of Systems Constitutional:   No  weight loss, night sweats,  Fevers, chills, fatigue, or  lassitude.  HEENT:   No headaches,  Difficulty swallowing,  Tooth/dental problems, or  Sore throat,                No sneezing, itching, ear ache, nasal congestion, post nasal drip,   CV:  No chest pain,  Orthopnea, PND, swelling in lower extremities, anasarca, dizziness, palpitations, syncope.   GI  No heartburn, indigestion, abdominal pain, nausea, vomiting, diarrhea, change in bowel habits, loss of appetite, bloody stools.   Resp:   No coughing up of blood.  No change in color of mucus.  No wheezing.  No chest wall deformity  Skin: no rash or lesions.  GU: no dysuria, change in color of urine, no urgency or frequency.  No flank pain, no hematuria   MS:  No joint pain or swelling.  No decreased range of motion.  No back pain.  Psych:  No change in mood or affect. No depression or anxiety.  No memory loss.      Objective:   Physical Exam Filed Vitals:   11/13/13 1610  BP: 130/88  Pulse: 84  Height: 5\' 6"  (1.676 m)  Weight: 158 lb 6.4 oz (71.85 kg)  SpO2: 92%   GEN: A/Ox3; pleasant , NAD, thin   HEENT:  [place]/AT,  EACs-clear, TMs-wnl, NOSE-clear, THROAT-clear, no lesions, no postnasal drip or exudate noted, missing several teeth   NECK:  Supple w/ fair ROM; no JVD; normal carotid impulses w/o bruits; no thyromegaly or nodules palpated; no lymphadenopathy.  RESP  Clear   P & A; distant but no wheezes/ rales/ or rhonchi.no accessory muscle use  CARD:  RRR, no m/r/g  , no peripheral edema, pulses intact, no cyanosis or clubbing.  Musco: Warm bil, no deformities or joint swelling noted.   Neuro: alert, no focal deficits noted.    Skin: Warm, no lesions or rashes      Assessment & Plan:  No problem-specific assessment & plan notes found for this encounter.

## 2013-11-13 NOTE — Patient Instructions (Signed)
Please stop your Spiriva and Advair We will start Anoro one inhalation daily Your oxygen dropped slightly when walking today Follow with Dr Lamonte Sakai in 1 month

## 2013-11-13 NOTE — Assessment & Plan Note (Signed)
No flares but he describes a slight decline in his exercise tolerance. He was on O2 in the past when he was d/c from the hospital, then was able to d/c. Would like to recheck today, see if he desaturates. Will change from advair + spiriva to anoro as a trial. rov1

## 2013-11-13 NOTE — Addendum Note (Signed)
Addended by: Carlos American A on: 11/13/2013 04:53 PM   Modules accepted: Orders

## 2013-11-18 ENCOUNTER — Ambulatory Visit (INDEPENDENT_AMBULATORY_CARE_PROVIDER_SITE_OTHER): Payer: BC Managed Care – PPO | Admitting: Licensed Clinical Social Worker

## 2013-11-18 DIAGNOSIS — F4323 Adjustment disorder with mixed anxiety and depressed mood: Secondary | ICD-10-CM

## 2013-12-08 ENCOUNTER — Telehealth: Payer: Self-pay | Admitting: Emergency Medicine

## 2013-12-08 MED ORDER — UMECLIDINIUM-VILANTEROL 62.5-25 MCG/INH IN AEPB: 1.0000 | INHALATION_SPRAY | Freq: Every day | RESPIRATORY_TRACT | Status: DC

## 2013-12-08 NOTE — Telephone Encounter (Signed)
Pt aware samples left for pick up. Nothing further needed 

## 2013-12-09 ENCOUNTER — Ambulatory Visit (INDEPENDENT_AMBULATORY_CARE_PROVIDER_SITE_OTHER): Payer: BC Managed Care – PPO | Admitting: Licensed Clinical Social Worker

## 2013-12-09 DIAGNOSIS — F4323 Adjustment disorder with mixed anxiety and depressed mood: Secondary | ICD-10-CM

## 2013-12-26 ENCOUNTER — Ambulatory Visit (INDEPENDENT_AMBULATORY_CARE_PROVIDER_SITE_OTHER): Payer: BC Managed Care – PPO | Admitting: Emergency Medicine

## 2013-12-26 ENCOUNTER — Encounter: Payer: Self-pay | Admitting: Emergency Medicine

## 2013-12-26 VITALS — BP 128/66 | HR 85 | Ht 66.0 in | Wt 159.0 lb

## 2013-12-26 DIAGNOSIS — F17200 Nicotine dependence, unspecified, uncomplicated: Secondary | ICD-10-CM

## 2013-12-26 DIAGNOSIS — J449 Chronic obstructive pulmonary disease, unspecified: Secondary | ICD-10-CM

## 2013-12-26 DIAGNOSIS — Z720 Tobacco use: Secondary | ICD-10-CM

## 2013-12-26 NOTE — Assessment & Plan Note (Signed)
-   will stay on anoro qd - walking oximetry today - smoking cessation discussed.  - rov 4

## 2013-12-26 NOTE — Patient Instructions (Signed)
We will continue your Anoro one inhalation daily Walking oximetry today You need to stop smoking. Keep working on this.  Follow with Dr Lamonte Sakai in 4 months or sooner if you have any problems.

## 2013-12-26 NOTE — Assessment & Plan Note (Signed)
Discussed cessation with him today 

## 2013-12-26 NOTE — Progress Notes (Signed)
Subjective:    Patient ID: Frank [name], male    DOB: [birth year], 58 y.o.   MRN: [id number]  HPI 58 yo smoker admitted 06/05/12 for COPD exacerbation .  PCCM w/ initial consult   06/24/12 -- [facility] follow up  Pt w/ progressive cough, dyspnea and wheezing 4 days PTA . Admitted started on aggressive pulmonary hygiene regimen w/ nebs, steroids and abx.  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  Has FH of ? Lung dz -parent deceased.   Since discharge he is feeling better Less dyspnea and cough  Started on advair and spiriva at discharge Finished steroids taper.  Walking sats in office w/out desat .  Wants to go back to work.  No hemoptysis , chest pain or edema  CXR showed COPD changes .  No previous dx of COPD , no PFT in past.    ROV 08/30/12 -- COPD, seen by me as hosp consult in 1/'14 when he was admitted for AE. Returns today after PFT >> severe AFL, hyperinflation, decreased DLCO. He is on advair + spiriva. Uses albuterol once in the am. He feels that he is getting back to baseline - actually better fxn than before he was hospitalized. He was discharged on 3L/min at all times, he has been using prn. 3 weeks ago he had nasal congestion, treated symptomatically. No wheeze, rare cough, produces clear mucous.   ROV 12/31/12 -- follow up for GOLD b COPD, managed on Spiriva + Advair. He feels much better. Has NOT restarted smoking. He used to average 1 AE a year. He is UTD on pneumovax. His functional capacity is good w walking, but some limitation with heavier work. 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 No wheeze.   ROV 05/14/13 -- follow up for GOLD b COPD, managed on Spiriva + Advair. He has been doing well, no flares since last visit. He has been compliant with his inhaled meds. He rarely has exertional limitation - only w heavy exercise. No wheeze or cough. Uses ventolin most mornings, sometimes twice a day.    ROV 11/13/13 -- follow up for GOLD c COPD, managed on Spiriva + Advair. He returns for a regular f/u. He believes that his breathing is close to the same, but he also describes needing to rest with carrying laundry, difficulty in the humidity of the shower. He wonders about whether he should try to get disability.  He no longer works, but he is active.   ROV 12/26/13 -- follows for COPD. Last time we changed Advair/ Spiriva to Anoro.  Not really sure that The Anoro has helped much. He is smoking again - about 1/2 pk a day. Uses SABA 2x a day.     PULMONARY FUNCTON TEST 08/30/2012  FVC 3.93  FEV1 1.69  FEV1/FVC 43  FVC  % Predicted 93  FEV % Predicted 55  FeF 25-75 .71  FeF 25-75 % Predicted 3.05    Review of Systems     Objective:   Physical Exam Filed Vitals:   12/26/13 1523  BP: 128/66  Pulse: 85  Height: 5\' 6"  (1.676 m)  Weight: 159 lb (72.122 kg)  SpO2: 93%   GEN: A/Ox3; pleasant , NAD, thin   HEENT:  [place]/AT,  EACs-clear, TMs-wnl, NOSE-clear, THROAT-clear, no lesions, no postnasal drip or exudate noted, missing several teeth   NECK:  Supple w/ fair ROM; no JVD; normal carotid impulses w/o bruits;  no thyromegaly or nodules palpated; no lymphadenopathy.  RESP  Clear  P & A; distant but no wheezes/ rales/ or rhonchi.no accessory muscle use  CARD:  RRR, no m/r/g  , no peripheral edema, pulses intact, no cyanosis or clubbing.  Musco: Warm bil, no deformities or joint swelling noted.   Neuro: alert, no focal deficits noted.    Skin: Warm, no lesions or rashes      Assessment & Plan:  Tobacco abuse Discussed cessation with him today  COPD (chronic obstructive pulmonary disease) - will stay on anoro qd - walking oximetry today - smoking cessation discussed.  - rov 4

## 2013-12-29 ENCOUNTER — Telehealth: Payer: Self-pay | Admitting: Emergency Medicine

## 2013-12-29 MED ORDER — UMECLIDINIUM-VILANTEROL 62.5-25 MCG/INH IN AEPB: 1.0000 | INHALATION_SPRAY | Freq: Every day | RESPIRATORY_TRACT | Status: DC

## 2013-12-29 NOTE — Telephone Encounter (Signed)
lmtcb x1 

## 2013-12-29 NOTE — Telephone Encounter (Signed)
Pt has returned call. Please call him back at [phone].

## 2013-12-29 NOTE — Telephone Encounter (Signed)
Pt called pack give the wrong pharm [facility] on [address].[name]

## 2013-12-29 NOTE — Telephone Encounter (Signed)
Pt aware that Anoro rx has been sent to [facility].  Nothing further needed.

## 2014-01-07 ENCOUNTER — Ambulatory Visit (INDEPENDENT_AMBULATORY_CARE_PROVIDER_SITE_OTHER): Payer: BC Managed Care – PPO | Admitting: Licensed Clinical Social Worker

## 2014-01-07 DIAGNOSIS — F4323 Adjustment disorder with mixed anxiety and depressed mood: Secondary | ICD-10-CM

## 2014-02-10 ENCOUNTER — Ambulatory Visit (INDEPENDENT_AMBULATORY_CARE_PROVIDER_SITE_OTHER): Payer: BC Managed Care – PPO | Admitting: Licensed Clinical Social Worker

## 2014-02-10 DIAGNOSIS — F4323 Adjustment disorder with mixed anxiety and depressed mood: Secondary | ICD-10-CM

## 2014-02-20 ENCOUNTER — Encounter (HOSPITAL_COMMUNITY): Payer: Self-pay | Admitting: Emergency Medicine

## 2014-02-20 ENCOUNTER — Ambulatory Visit: Payer: BC Managed Care – PPO | Admitting: Emergency Medicine

## 2014-02-20 ENCOUNTER — Emergency Department (HOSPITAL_COMMUNITY): Payer: BC Managed Care – PPO

## 2014-02-20 ENCOUNTER — Emergency Department (HOSPITAL_COMMUNITY)
Admission: EM | Admit: 2014-02-20 | Discharge: 2014-02-20 | Disposition: A | Discharge status: Home / Self Care | Payer: BC Managed Care – PPO | Attending: Emergency Medicine | Admitting: Emergency Medicine

## 2014-02-20 DIAGNOSIS — Z79899 Other long term (current) drug therapy: Secondary | ICD-10-CM | POA: Insufficient documentation

## 2014-02-20 DIAGNOSIS — Z720 Tobacco use: Secondary | ICD-10-CM | POA: Diagnosis not present

## 2014-02-20 DIAGNOSIS — J441 Chronic obstructive pulmonary disease with (acute) exacerbation: Secondary | ICD-10-CM | POA: Diagnosis not present

## 2014-02-20 DIAGNOSIS — R0602 Shortness of breath: Secondary | ICD-10-CM | POA: Diagnosis present

## 2014-02-20 LAB — BASIC METABOLIC PANEL
ANION GAP: 18 — AB (ref 5–15)
BUN: 15 mg/dL (ref 6–23)
CALCIUM: 9.6 mg/dL (ref 8.4–10.5)
CHLORIDE: 97 meq/L (ref 96–112)
CO2: 24 mEq/L (ref 19–32)
Creatinine, Ser: 0.94 mg/dL (ref 0.50–1.35)
GFR calc non Af Amer: 89 mL/min — ABNORMAL LOW (ref >90)
Glucose, Bld: 93 mg/dL (ref 70–99)
Potassium: 4 mEq/L (ref 3.7–5.3)
Sodium: 139 mEq/L (ref 137–147)

## 2014-02-20 LAB — PROTIME-INR
INR: 1.06 (ref 0.00–1.49)
Prothrombin Time: 14 seconds (ref 11.6–15.2)

## 2014-02-20 LAB — CBC WITH DIFFERENTIAL/PLATELET
BASOS ABS: 0.1 10*3/uL (ref 0.0–0.1)
Basophils Relative: 1 % (ref 0–1)
Eosinophils Absolute: 0.1 10*3/uL (ref 0.0–0.7)
Eosinophils Relative: 1 % (ref 0–5)
HCT: 49.5 % (ref 39.0–52.0)
HEMOGLOBIN: 17 g/dL (ref 13.0–17.0)
Lymphocytes Relative: 14 % (ref 12–46)
Lymphs Abs: 1.3 10*3/uL (ref 0.7–4.0)
MCH: 30 pg (ref 26.0–34.0)
MCHC: 34.3 g/dL (ref 30.0–36.0)
MCV: 87.3 fL (ref 78.0–100.0)
Monocytes Absolute: 1.5 10*3/uL — ABNORMAL HIGH (ref 0.1–1.0)
Monocytes Relative: 17 % — ABNORMAL HIGH (ref 3–12)
NEUTROS ABS: 6 10*3/uL (ref 1.7–7.7)
NEUTROS PCT: 67 % (ref 43–77)
Platelets: 217 10*3/uL (ref 150–400)
RBC: 5.67 MIL/uL (ref 4.22–5.81)
RDW: 12.8 % (ref 11.5–15.5)
WBC: 9 10*3/uL (ref 4.0–10.5)

## 2014-02-20 LAB — TROPONIN I: Troponin I: <0.3 ng/mL (ref <0.30)

## 2014-02-20 LAB — I-STAT TROPONIN, ED: Troponin i, poc: 0.02 ng/mL (ref 0.00–0.08)

## 2014-02-20 LAB — APTT: aPTT: 29 seconds (ref 24–37)

## 2014-02-20 LAB — PRO B NATRIURETIC PEPTIDE: Pro B Natriuretic peptide (BNP): 47.4 pg/mL (ref 0–125)

## 2014-02-20 MED ORDER — IPRATROPIUM-ALBUTEROL 0.5-2.5 (3) MG/3ML IN SOLN
3.0000 mL | RESPIRATORY_TRACT | Status: DC
  Administered 2014-02-20: 3 mL via RESPIRATORY_TRACT
  Filled 2014-02-20: qty 3

## 2014-02-20 MED ORDER — PREDNISONE 20 MG PO TABS: ORAL_TABLET | ORAL | Status: DC

## 2014-02-20 MED ORDER — METHYLPREDNISOLONE SODIUM SUCC 125 MG IJ SOLR
125.0000 mg | Freq: Once | INTRAMUSCULAR | Status: AC
  Administered 2014-02-20: 125 mg via INTRAVENOUS
  Filled 2014-02-20: qty 2

## 2014-02-20 MED ORDER — ALBUTEROL SULFATE HFA 108 (90 BASE) MCG/ACT IN AERS: 2.0000 | INHALATION_SPRAY | RESPIRATORY_TRACT | Status: DC | PRN

## 2014-02-20 MED ORDER — PREDNISONE 20 MG PO TABS
40.0000 mg | ORAL_TABLET | Freq: Once | ORAL | Status: AC
  Administered 2014-02-20: 40 mg via ORAL
  Filled 2014-02-20: qty 2

## 2014-02-20 NOTE — ED Notes (Signed)
Pt c/o cough and increased SOB today; pt labored at present with audible wheezes

## 2014-02-20 NOTE — ED Notes (Signed)
Dr. Pfeiffer at bedside   

## 2014-02-20 NOTE — ED Notes (Signed)
Family at bedside. 

## 2014-02-20 NOTE — ED Provider Notes (Signed)
CSN: [id number]     Arrival date & time 02/20/14  1446 History   First MD Initiated Contact with Patient 02/20/14 1459     Chief Complaint  Patient presents with  . Shortness of Breath     (Consider location/radiation/quality/duration/timing/severity/associated sxs/prior Treatment) HPI The patient presents with a two-day history of increasing shortness of breath and cough. He has a history of COPD. He reports this is worse than his baseline COPD. He reports that he is short of breath with crossing his house at this point time which is unusual for him. He also reports over the past 2 days she's had increasing cough. He describes cough paroxysmal is with production of clear sputum. He reports his chest only hurts if he is coughing really hard. The patient reports that his pulmonologist has suggested to him that he may need home oxygen. The patient has had similar symptoms in the past with pneumonia. He however has had no fever no general malaise. Patient reports some mild sore throat association with this over the past 2 days no other ENT symptoms. He denies nausea vomiting or diarrhea. He denies any swelling of his legs or pain in his calves. Patient does not endorse any recent use of steroids for control of symptoms.  Past Medical History  Diagnosis Date  . Tobacco abuse 06/05/2012  . Obstructive chronic bronchitis with exacerbation 06/07/2012  . Hypoxemia 06/07/2012  . Emphysema 06/07/2012  . COPD (chronic obstructive pulmonary disease) 06/05/2012   Past Surgical History  Procedure Laterality Date  . Cataract extraction, bilateral     Family History  Problem Relation Age of Onset  . Colon polyps Father   . Colon polyps Sister    History  Substance Use Topics  . Smoking status: Current Every Day Smoker -- 1.50 packs/day for 35 years    Types: Cigarettes  . Smokeless tobacco: Former Systems developer    Types: [place] date: 05/08/1968  . Alcohol Use: No    Review of Systems 10 Systems  reviewed and are negative for acute change except as noted in the HPI.    Allergies  Review of patient's allergies indicates no known allergies.  Home Medications   Prior to Admission medications   Medication Sig Start Date End Date Taking? Authorizing Provider  albuterol (PROVENTIL HFA;VENTOLIN HFA) 108 (90 BASE) MCG/ACT inhaler Inhale 2 puffs into the lungs every 6 (six) hours as needed for wheezing or shortness of breath.   Yes Historical Provider, MD  ibuprofen (ADVIL,MOTRIN) 200 MG tablet Take 400 mg by mouth every 6 (six) hours as needed (pain).   Yes Historical Provider, MD  Umeclidinium-Vilanterol (ANORO ELLIPTA) 62.5-25 MCG/INH AEPB Inhale 1 puff into the lungs daily. 12/29/13  Yes Collene Gobble, MD  albuterol (ACCUNEB) 0.63 MG/3ML nebulizer solution Take 1 ampule by nebulization every 6 (six) hours as needed. For cough/wheezing    Historical Provider, MD  albuterol (PROVENTIL HFA;VENTOLIN HFA) 108 (90 BASE) MCG/ACT inhaler Inhale 2 puffs into the lungs every 4 (four) hours as needed for wheezing or shortness of breath. 02/20/14   Charlesetta Shanks, MD  predniSONE (DELTASONE) 20 MG tablet 3 tabs po day one, then 2 po daily x 4 days 02/20/14   Charlesetta Shanks, MD   BP 117/65  Pulse 101  Temp(Src) 99.6 F (37.6 C) (Oral)  Resp 31  Ht 5\' 8"  (1.727 m)  Wt 163 lb (73.936 kg)  BMI 24.79 kg/m2  SpO2 91% Physical Exam  Constitutional: He is oriented  to person, place, and time. He appears well-developed and well-nourished.  Mild respiratory distress at rest. Patient is speaking in full sentences his mental status is clear and alert. His color is good.  HENT:  Head: Normocephalic and atraumatic.  Nose: Nose normal.  Mouth/Throat: Oropharynx is clear and moist.  Eyes: EOM are normal. Pupils are equal, round, and reactive to light.  Neck: Neck supple.  Cardiovascular: Regular rhythm, normal heart sounds and intact distal pulses.   Mild tachycardia. Regular. Distant heart sounds. Strong  distal pulses.  Pulmonary/Chest:  Patient exhibits some mild increased work of breathing with tachypnea. He has diffuse expiratory wheeze. Diminished breath sounds the bases.  Abdominal: Soft. Bowel sounds are normal. He exhibits no distension. There is no tenderness.  Musculoskeletal: Normal range of motion. He exhibits no edema and no tenderness.  Neurological: He is alert and oriented to person, place, and time. He has normal strength. Coordination normal. GCS eye subscore is 4. GCS verbal subscore is 5. GCS motor subscore is 6.  Skin: Skin is warm, dry and intact.  Psychiatric: He has a normal mood and affect.    ED Course  Procedures (including critical care time) Labs Review Labs Reviewed  BASIC METABOLIC PANEL - Abnormal; Notable for the following:    GFR calc non Af Amer 89 (*)    Anion gap 18 (*)    All other components within normal limits  CBC WITH DIFFERENTIAL - Abnormal; Notable for the following:    Monocytes Relative 17 (*)    Monocytes Absolute 1.5 (*)    All other components within normal limits  PRO B NATRIURETIC PEPTIDE  APTT  PROTIME-INR  TROPONIN I  Randolm Idol, ED    Imaging Review Dg Chest Port 1 View  02/20/2014   CLINICAL DATA:  Short of breath 2 days.  Heavy coughing.  EXAM: PORTABLE CHEST - 1 VIEW  COMPARISON:  Radiograph 06/07/2012  FINDINGS: Normal cardiac silhouette. Lungs are hyperinflated. There is biapical pulmonary scarring. No evidence of pneumothorax. Mild bronchitic markings noted.  IMPRESSION: No acute cardiopulmonary process.  Hyperinflated lungs.   Electronically Signed   By: Suzy Bouchard M.D.   On: 02/20/2014 16:07     EKG Interpretation   Date/Time:  Friday February 20 2014 15:08:48 EDT Ventricular Rate:  96 PR Interval:  154 QRS Duration: 128 QT Interval:  359 QTC Calculation: 454 R Axis:   -150 Text Interpretation:  Sinus rhythm RBBB and LPFB Probable inferior  infarct, old Lateral leads are also involved Confirmed by  Johnney Killian, MD,  Jeannie Done [phone]) on 02/20/2014 3:13:36 PM      MDM   Final diagnoses:  SOB (shortness of breath)  COPD exacerbation   At this time patient has 2 negative sets of cardiac enzymes. He has ambulated without significant desaturation. His symptoms are consistent with COPD exacerbation without current evidence of pneumonia. Upon recheck patient reports that he still feels congested in the head but that he is breathing better. As I reassessed the patient is sitting down eating a meal tray without any acute respiratory distress. The patient will be home with family members. They're advised that any point time if he should have worsening difficulty breathing or new symptoms he is to return for reassessment. Otherwise they're to followup with family physician on Monday.    Charlesetta Shanks, MD 02/20/14 2056

## 2014-02-20 NOTE — Discharge Instructions (Signed)

## 2014-02-20 NOTE — ED Notes (Signed)
Ambulated pt up and down the hall. O2 level dropped from 97% to 87%. By the end of the walk, pt stated that he began to feel light headed. Pt short of breathe after ambulating.

## 2014-02-20 NOTE — Progress Notes (Signed)
Attempted ABG X2

## 2014-02-20 NOTE — ED Notes (Signed)
pts O2 in RA is 90%, Dr Johnney Killian aware. Goodnight with discharge, pts family states that they understand to call PCP Monday morning.

## 2014-02-21 ENCOUNTER — Telehealth: Payer: Self-pay | Admitting: Critical Care Medicine

## 2014-02-21 MED ORDER — ALBUTEROL SULFATE 0.63 MG/3ML IN NEBU: 1.0000 | INHALATION_SOLUTION | Freq: Four times a day (QID) | RESPIRATORY_TRACT | Status: DC | PRN

## 2014-02-21 NOTE — Telephone Encounter (Signed)
Needs refills on albuterol Pt in ED over weekend Needs OV soon

## 2014-02-23 ENCOUNTER — Ambulatory Visit (INDEPENDENT_AMBULATORY_CARE_PROVIDER_SITE_OTHER): Payer: BC Managed Care – PPO | Admitting: Adult Health

## 2014-02-23 ENCOUNTER — Encounter: Payer: Self-pay | Admitting: Adult Health

## 2014-02-23 VITALS — BP 112/60 | HR 76 | Temp 97.6°F | Ht 66.0 in | Wt 161.1 lb

## 2014-02-23 DIAGNOSIS — J449 Chronic obstructive pulmonary disease, unspecified: Secondary | ICD-10-CM

## 2014-02-23 MED ORDER — ALBUTEROL SULFATE HFA 108 (90 BASE) MCG/ACT IN AERS: 2.0000 | INHALATION_SPRAY | Freq: Four times a day (QID) | RESPIRATORY_TRACT | Status: DC | PRN

## 2014-02-23 MED ORDER — DOXYCYCLINE HYCLATE 100 MG PO TABS: 100.0000 mg | ORAL_TABLET | Freq: Two times a day (BID) | ORAL | Status: DC

## 2014-02-23 NOTE — Telephone Encounter (Signed)
lmtcb X1 to schedule hospital follow up.

## 2014-02-23 NOTE — Patient Instructions (Signed)
Doxycycline 100mg  Twice daily  For 7 days -take with food.  Mucinex DM Twice daily  As needed  Cough and congestion  Finish prednisone taper as directed.  Fluids and rest  Please contact office for sooner follow up if symptoms do not improve or worsen or seek emergency care  Follow up [name]  In 2-3 weeks and As needed

## 2014-02-24 NOTE — Progress Notes (Signed)
   Subjective:    Patient ID: Frank [name], male    DOB: [birth year], 58 y.o.   MRN: [id number]  HPI 58 yo male with known hx of GOLD c COPD, active smoker   02/23/14 Acute OV  Pt presents for an acute office visit. Complains of  increased SOB, prod cough with clear- to yellow mucus, wheezing x4 days.  Went to ED on 10/16. CXR showed no acute process Labs were unrevealing with neg troponin  , nml WBC and BNP. He was given a steroid taper . He is starting to feel better w/ less cough and wheezing. He complains that he is still coughing up thick yellow mucus. No fever or hemoptysis, leg swelling or PND.    Review of Systems Constitutional:   No  weight loss, night sweats,  Fevers, chills, fatigue, or  lassitude.  HEENT:   No headaches,  Difficulty swallowing,  Tooth/dental problems, or  Sore throat,                No sneezing, itching, ear ache,  +nasal congestion, post nasal drip,   CV:  No chest pain,  Orthopnea, PND, swelling in lower extremities, anasarca, dizziness, palpitations, syncope.   GI  No heartburn, indigestion, abdominal pain, nausea, vomiting, diarrhea, change in bowel habits, loss of appetite, bloody stools.   Resp:  .  No chest wall deformity  Skin: no rash or lesions.  GU: no dysuria, change in color of urine, no urgency or frequency.  No flank pain, no hematuria   MS:  No joint pain or swelling.  No decreased range of motion.  No back pain.  Psych:  No change in mood or affect. No depression or anxiety.  No memory loss.         Objective:   Physical Exam GEN: A/Ox3; pleasant , NAD, elderly   HEENT:  [place]/AT,  EACs-clear, TMs-wnl, NOSE-clear drainage  THROAT-clear, no lesions, no postnasal drip or exudate noted.   NECK:  Supple w/ fair ROM; no JVD; normal carotid impulses w/o bruits; no thyromegaly or nodules palpated; no lymphadenopathy.  RESP  Few trace rhonchi, w/o, wheezesno accessory muscle use, no dullness to percussion  CARD:  RRR, no m/r/g  , tr   peripheral edema, pulses intact, no cyanosis or clubbing.  GI:   Soft & nt; nml bowel sounds; no organomegaly or masses detected.  Musco: Warm bil, no deformities or joint swelling noted.   Neuro: alert, no focal deficits noted.    Skin: Warm, no lesions or rashes    Recent Labs Lab 02/20/14 1505  HGB 17.0  HCT 49.5  WBC 9.0  PLT 217     Recent Labs Lab 02/20/14 1505  PROBNP 47.4     Recent Labs Lab 02/20/14 1505  NA 139  K 4.0  CL 97  CO2 24  GLUCOSE 93  BUN 15  CREATININE 0.94  CALCIUM 9.6    Recent Labs Lab 02/20/14 1505  HGB 17.0  HCT 49.5  WBC 9.0  PLT 217    CXR 02/20/14  No acute cardiopulmonary process. Hyperinflated lungs.       Assessment & Plan:

## 2014-02-24 NOTE — Assessment & Plan Note (Signed)
Slow to resolve flare   Plan  Doxycycline 100mg  Twice daily  For 7 days -take with food.  Mucinex DM Twice daily  As needed  Cough and congestion  Finish prednisone taper as directed.  Fluids and rest  Please contact office for sooner follow up if symptoms do not improve or worsen or seek emergency care  Follow up [name]  In 2-3 weeks and As needed

## 2014-03-10 ENCOUNTER — Ambulatory Visit (INDEPENDENT_AMBULATORY_CARE_PROVIDER_SITE_OTHER): Payer: BC Managed Care – PPO | Admitting: Licensed Clinical Social Worker

## 2014-03-10 DIAGNOSIS — F4323 Adjustment disorder with mixed anxiety and depressed mood: Secondary | ICD-10-CM

## 2014-03-11 ENCOUNTER — Encounter: Payer: Self-pay | Admitting: Emergency Medicine

## 2014-03-11 ENCOUNTER — Telehealth: Payer: Self-pay | Admitting: Emergency Medicine

## 2014-03-11 ENCOUNTER — Ambulatory Visit (INDEPENDENT_AMBULATORY_CARE_PROVIDER_SITE_OTHER): Payer: BC Managed Care – PPO | Admitting: Emergency Medicine

## 2014-03-11 ENCOUNTER — Encounter (INDEPENDENT_AMBULATORY_CARE_PROVIDER_SITE_OTHER): Payer: Self-pay

## 2014-03-11 VITALS — BP 100/68 | HR 89 | Temp 98.4°F | Ht 66.0 in | Wt 161.8 lb

## 2014-03-11 DIAGNOSIS — J449 Chronic obstructive pulmonary disease, unspecified: Secondary | ICD-10-CM

## 2014-03-11 NOTE — Telephone Encounter (Signed)
Frank [name] called back bc sats are not in [facility] note yet

## 2014-03-11 NOTE — Patient Instructions (Signed)
Please continue your Anoro daily Walking oximetry today  Use albuterol when needed Flu Shot today Follow with Dr Lamonte Sakai in 6 months or sooner if you have any problems

## 2014-03-11 NOTE — Addendum Note (Signed)
Addended by: Mathis Dad on: 03/11/2014 10:27 AM   Modules accepted: Orders

## 2014-03-11 NOTE — Progress Notes (Signed)
Subjective:    Patient ID: Frank [name], male    DOB: 11-14-1955, 58 y.o.   MRN: [id number]  HPI 58 yo smoker admitted 06/05/12 for COPD exacerbation .  PCCM w/ initial consult   06/24/12 -- [facility] follow up  Pt w/ progressive cough, dyspnea and wheezing 4 days PTA . Admitted started on aggressive pulmonary hygiene regimen w/ nebs, steroids and abx.  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  Has FH of ? Lung dz -parent deceased.   Since discharge he is feeling better Less dyspnea and cough  Started on advair and spiriva at discharge Finished steroids taper.  Walking sats in office w/out desat .  Wants to go back to work.  No hemoptysis , chest pain or edema  CXR showed COPD changes .  No previous dx of COPD , no PFT in past.    ROV 08/30/12 -- COPD, seen by me as hosp consult in 1/'14 when he was admitted for AE. Returns today after PFT >> severe AFL, hyperinflation, decreased DLCO. He is on advair + spiriva. Uses albuterol once in the am. He feels that he is getting back to baseline - actually better fxn than before he was hospitalized. He was discharged on 3L/min at all times, he has been using prn. 3 weeks ago he had nasal congestion, treated symptomatically. No wheeze, rare cough, produces clear mucous.   ROV 12/31/12 -- follow up for GOLD b COPD, managed on Spiriva + Advair. He feels much better. Has NOT restarted smoking. He used to average 1 AE a year. He is UTD on pneumovax. His functional capacity is good w walking, but some limitation with heavier work. 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 No wheeze.   ROV 05/14/13 -- follow up for GOLD b COPD, managed on Spiriva + Advair. He has been doing well, no flares since last visit. He has been compliant with his inhaled meds. He rarely has exertional limitation - only w heavy exercise. No wheeze or cough. Uses ventolin most mornings, sometimes twice a day.    ROV 11/13/13 -- follow up for GOLD c COPD, managed on Spiriva + Advair. He returns for a regular f/u. He believes that his breathing is close to the same, but he also describes needing to rest with carrying laundry, difficulty in the humidity of the shower. He wonders about whether he should try to get disability.  He no longer works, but he is active.   ROV 12/26/13 -- follows for COPD. Last time we changed Advair/ Spiriva to Anoro.  Not really sure that The Anoro has helped much. He is smoking again - about 1/2 pk a day. Uses SABA 2x a day.   ROV 03/11/14 -- hx of COPD, seen by TP 10/19 and treated with doxycycline; he had been seen earlier in the ED. Continues to smoke. Currently on Anoro. He is averaging 1 flare a year, usually in the Fall.  Due for Flu shot.   PULMONARY FUNCTON TEST 08/30/2012  FVC 3.93  FEV1 1.69  FEV1/FVC 43  FVC  % Predicted 93  FEV % Predicted 55  FeF 25-75 .71  FeF 25-75 % Predicted 3.05    Review of Systems     Objective:   Physical Exam Filed Vitals:   03/11/14 1002  BP: 100/68  Pulse: 89  Temp: 98.4 F (36.9 C)  TempSrc: Oral  Height: 5\' 6"  (1.676 m)  Weight: 161 lb 12.8 oz (73.392 kg)  SpO2: 98%   GEN: A/Ox3; pleasant , NAD, thin   HEENT:  [place]/AT,  EACs-clear, TMs-wnl, NOSE-clear, THROAT-clear, no lesions, no postnasal drip or exudate noted, missing several teeth   NECK:  Supple w/ fair ROM; no JVD; normal carotid impulses w/o bruits; no thyromegaly or nodules palpated; no lymphadenopathy.  RESP  Clear  P & A; distant but no wheezes/ rales/ or rhonchi.no accessory muscle use  CARD:  RRR, no m/r/g  , no peripheral edema, pulses intact, no cyanosis or clubbing.  Musco: Warm bil, no deformities or joint swelling noted.   Neuro: alert, no focal deficits noted.    Skin: Warm, no lesions or rashes      Assessment & Plan:  COPD (chronic obstructive pulmonary disease) Please continue your Anoro daily Walking oximetry today  Use albuterol when  needed Flu Shot today Follow with Dr Lamonte Sakai in 6 months or sooner if you have any problems

## 2014-03-11 NOTE — Telephone Encounter (Signed)
Pt was walked in office today. Will forward to Sharyn Lull so she can document in the patient care coordination note with pt O2 sats. Then let Melissa with [facility] know. Please advise thanks

## 2014-03-11 NOTE — Assessment & Plan Note (Signed)
Please continue your Anoro daily Walking oximetry today  Use albuterol when needed Flu Shot today Follow with Dr Lamonte Sakai in 6 months or sooner if you have any problems

## 2014-03-11 NOTE — Telephone Encounter (Signed)
Sats have been put in [facility].  Left message on Melissa's voicemail advising her that they have been put in patient's chart. Nothing further needed.

## 2014-03-13 IMAGING — CR DG CHEST 2V
2 series · 2 of 2 positions shown · non-contrast
Comparison: 02/12/2008

CLINICAL DATA: Fever.  Shortness of breath.

CHEST - 2 VIEW

[w chest pa]
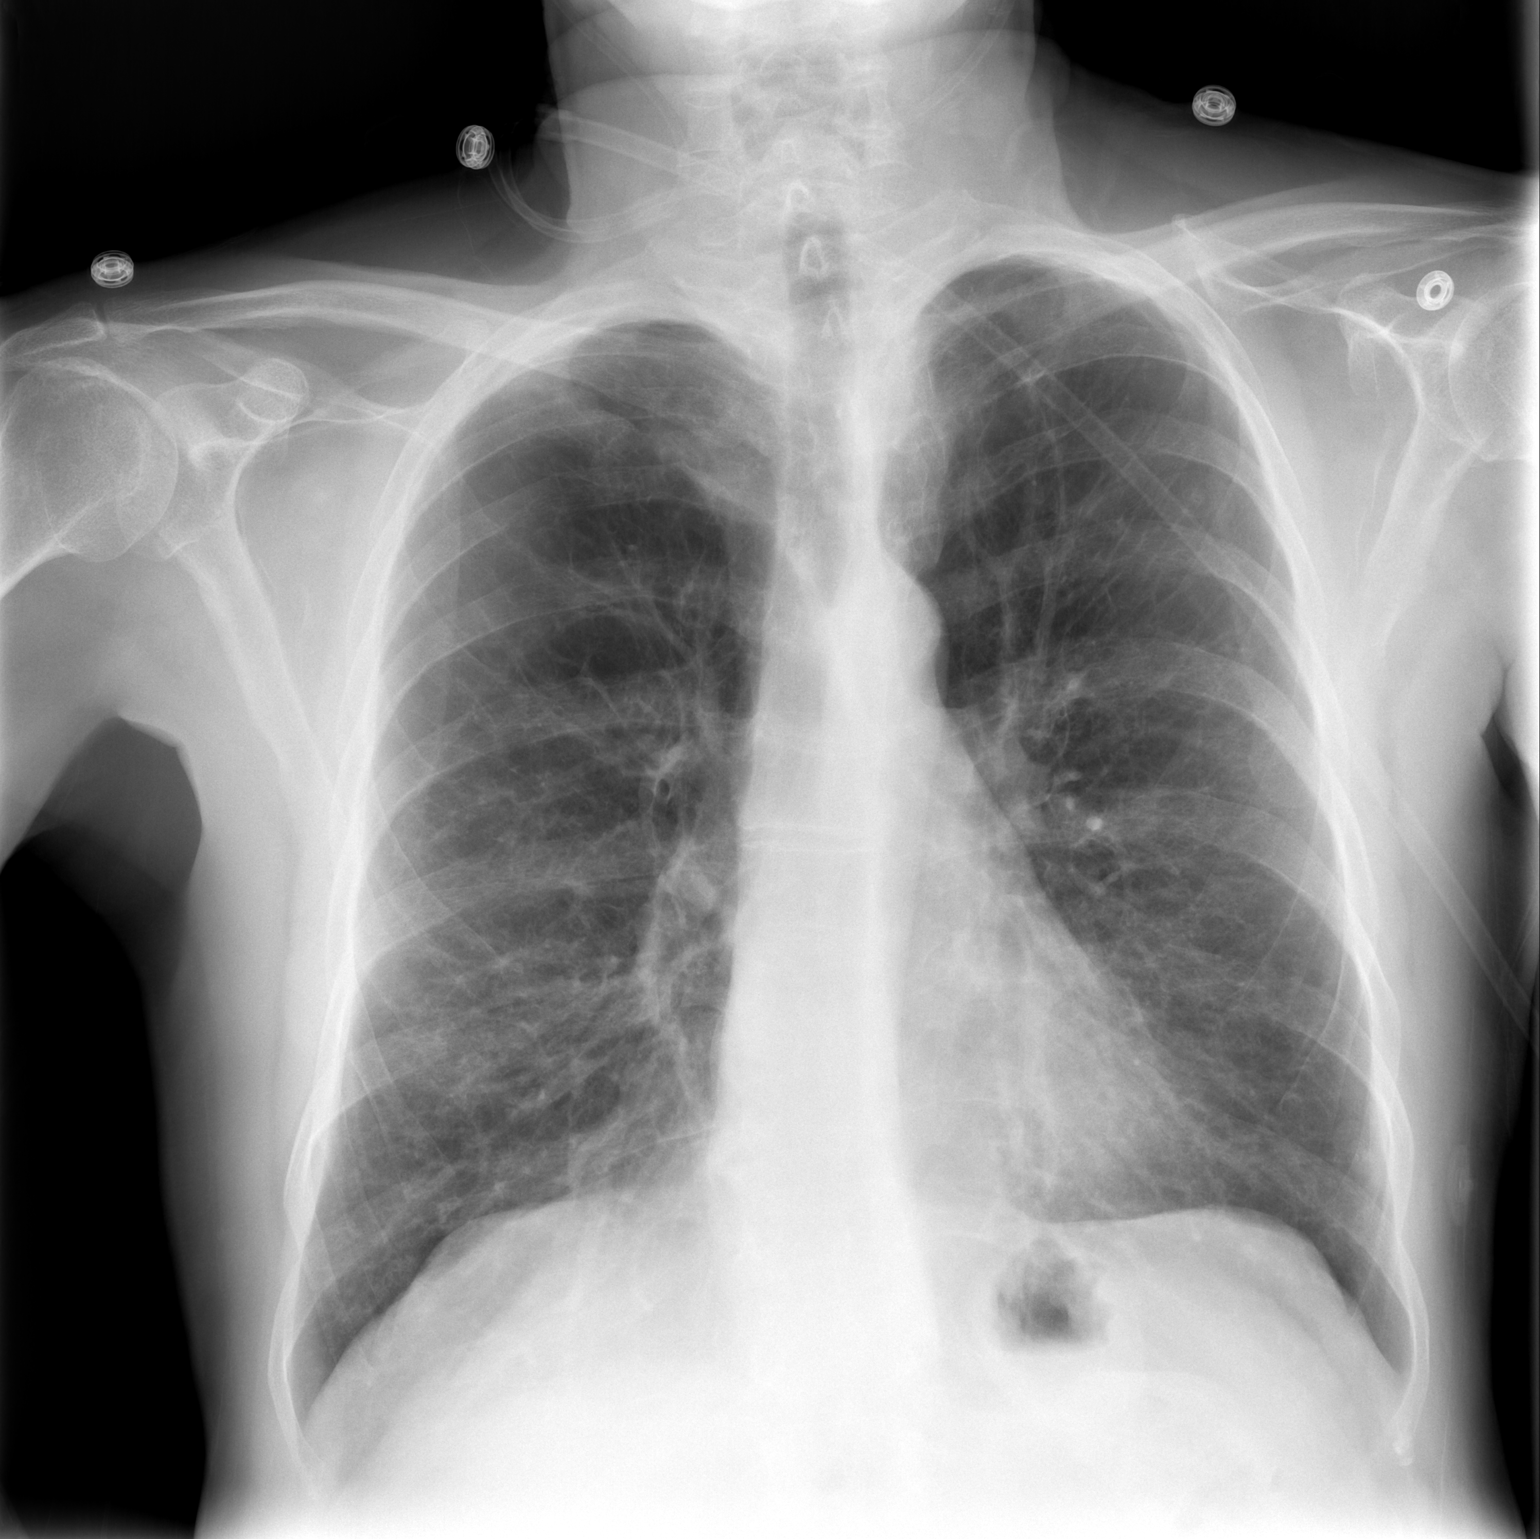

[w chest lat]
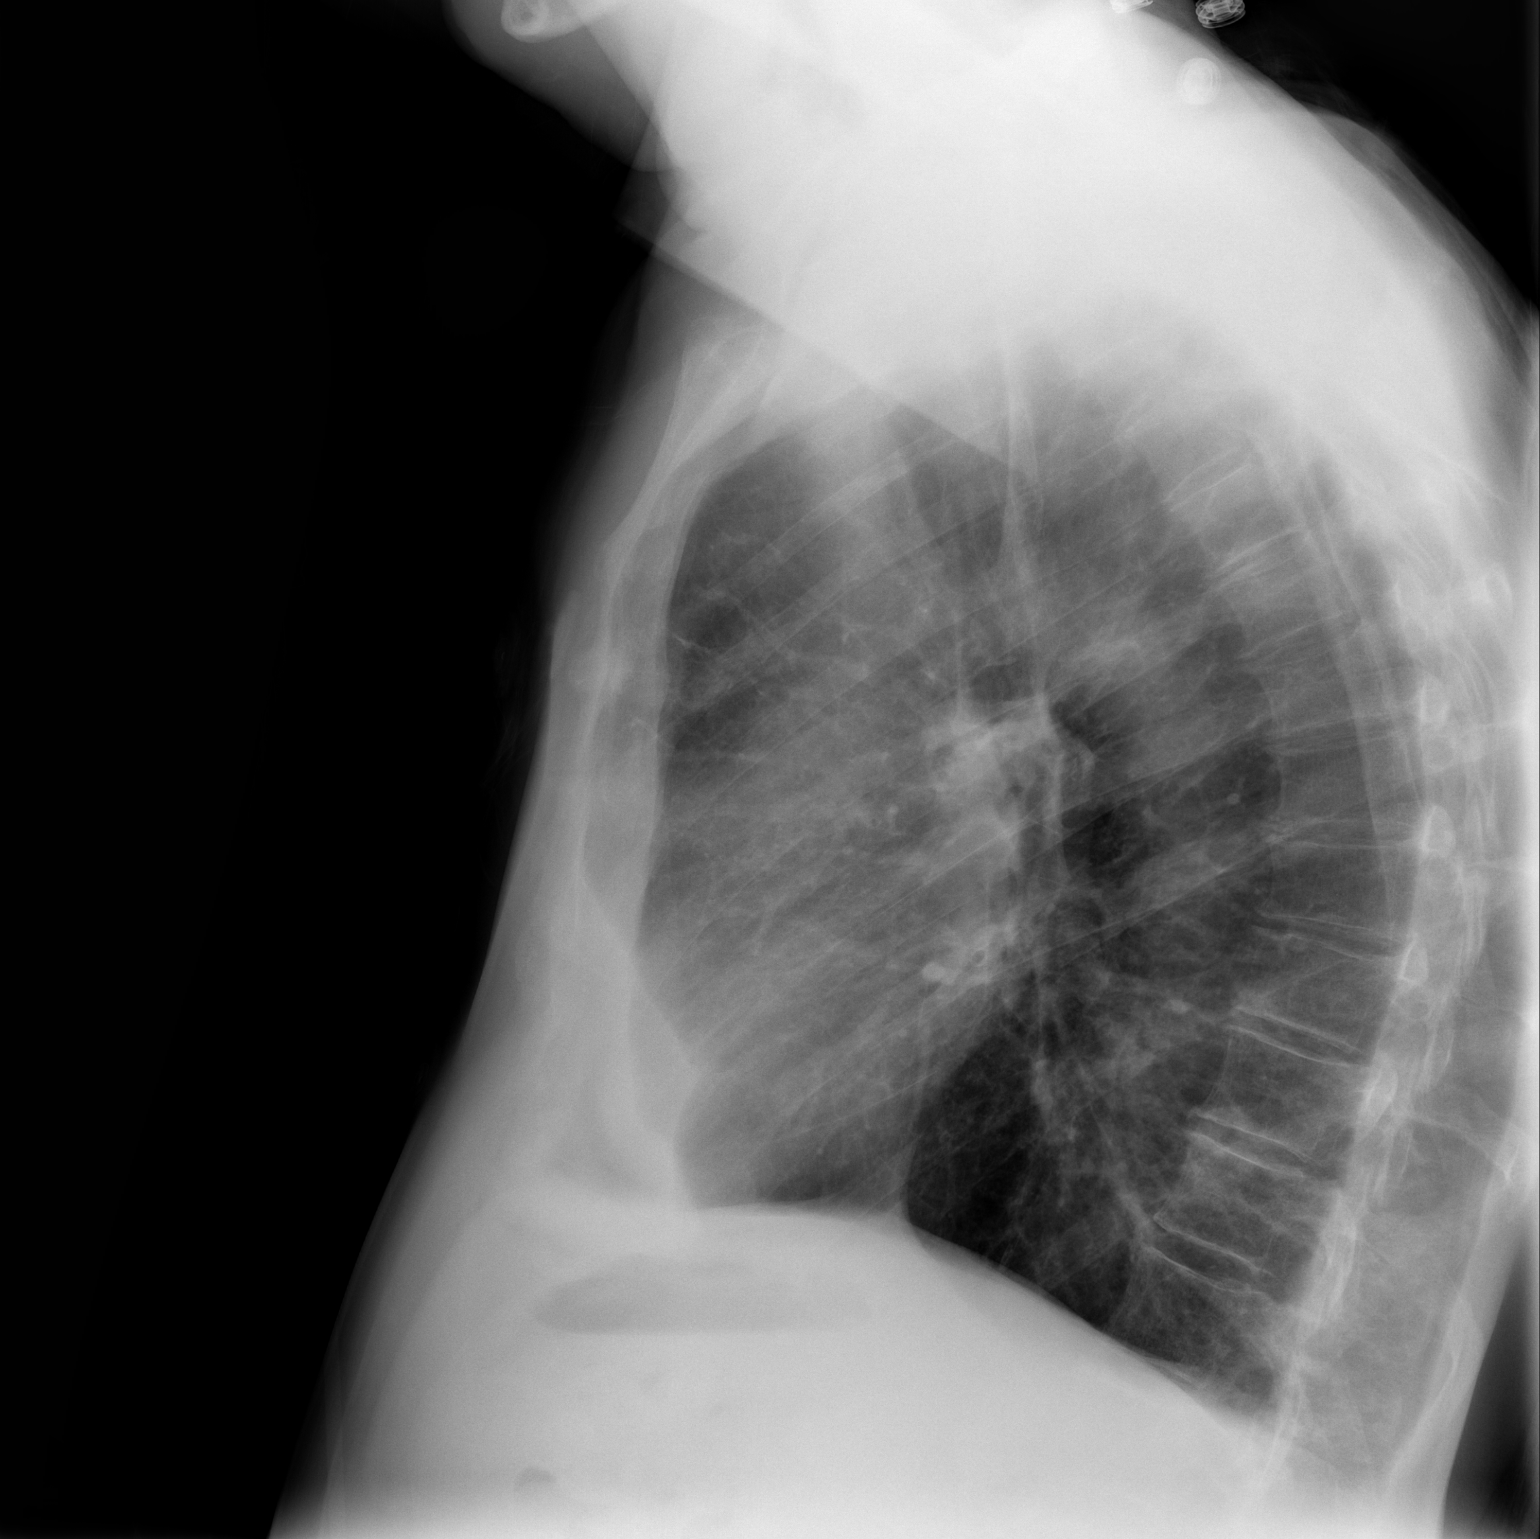

[2 of 2 positions shown; findings below may reference images not displayed]

FINDINGS: Lungs appear hyperexpanded with flattening of the
hemidiaphragms, increased retrosternal air space and pruning of the
pulmonary vasculature in the periphery, suggestive of underlying
COPD.  Mild diffuse bronchial wall thickening, similar to prior
examinations.  No acute consolidative airspace disease.  No pleural
effusions.  No evidence of pulmonary edema.  Heart size and
mediastinal contours are within normal limits.  Atherosclerotic
calcifications are noted within the arch of the aorta.
IMPRESSION: 1.  Chronic changes compatible with COPD redemonstrated, as above.
2.  Atherosclerosis.

## 2014-04-07 ENCOUNTER — Ambulatory Visit (INDEPENDENT_AMBULATORY_CARE_PROVIDER_SITE_OTHER): Payer: BC Managed Care – PPO | Admitting: Licensed Clinical Social Worker

## 2014-04-07 DIAGNOSIS — F4323 Adjustment disorder with mixed anxiety and depressed mood: Secondary | ICD-10-CM

## 2014-04-29 ENCOUNTER — Ambulatory Visit (INDEPENDENT_AMBULATORY_CARE_PROVIDER_SITE_OTHER): Payer: BC Managed Care – PPO | Admitting: Licensed Clinical Social Worker

## 2014-04-29 DIAGNOSIS — F4323 Adjustment disorder with mixed anxiety and depressed mood: Secondary | ICD-10-CM

## 2014-06-03 ENCOUNTER — Ambulatory Visit (INDEPENDENT_AMBULATORY_CARE_PROVIDER_SITE_OTHER): Payer: 59 | Admitting: Licensed Clinical Social Worker

## 2014-06-03 DIAGNOSIS — F4323 Adjustment disorder with mixed anxiety and depressed mood: Secondary | ICD-10-CM

## 2014-06-04 ENCOUNTER — Ambulatory Visit: Payer: Self-pay | Admitting: Licensed Clinical Social Worker

## 2014-06-10 ENCOUNTER — Telehealth: Payer: Self-pay | Admitting: Emergency Medicine

## 2014-06-10 DIAGNOSIS — J449 Chronic obstructive pulmonary disease, unspecified: Secondary | ICD-10-CM

## 2014-06-10 NOTE — Telephone Encounter (Signed)
Spoke with pt's sister and pt is wanting to get smaller and lighter portable oxygen tanks.  Informed her that I placed an order to have [facility] to evaluate him for smaller tanks.

## 2014-06-12 ENCOUNTER — Encounter: Payer: Self-pay | Admitting: Internal Medicine

## 2014-06-12 ENCOUNTER — Ambulatory Visit (INDEPENDENT_AMBULATORY_CARE_PROVIDER_SITE_OTHER): Payer: 59 | Admitting: Internal Medicine

## 2014-06-12 VITALS — BP 135/80 | HR 77 | Temp 97.7°F | Ht 66.0 in | Wt 164.2 lb

## 2014-06-12 DIAGNOSIS — N401 Enlarged prostate with lower urinary tract symptoms: Secondary | ICD-10-CM | POA: Insufficient documentation

## 2014-06-12 DIAGNOSIS — Z1211 Encounter for screening for malignant neoplasm of colon: Secondary | ICD-10-CM

## 2014-06-12 DIAGNOSIS — R3916 Straining to void: Secondary | ICD-10-CM

## 2014-06-12 DIAGNOSIS — C7951 Secondary malignant neoplasm of bone: Secondary | ICD-10-CM

## 2014-06-12 DIAGNOSIS — C61 Malignant neoplasm of prostate: Secondary | ICD-10-CM | POA: Insufficient documentation

## 2014-06-12 DIAGNOSIS — Z86010 Personal history of colonic polyps: Secondary | ICD-10-CM

## 2014-06-12 DIAGNOSIS — Z8639 Personal history of other endocrine, nutritional and metabolic disease: Secondary | ICD-10-CM

## 2014-06-12 DIAGNOSIS — R3911 Hesitancy of micturition: Secondary | ICD-10-CM

## 2014-06-12 HISTORY — DX: Secondary malignant neoplasm of bone: C79.51

## 2014-06-12 LAB — GLUCOSE, CAPILLARY: Glucose-Capillary: 92 mg/dL (ref 70–99)

## 2014-06-12 LAB — IFOBT (OCCULT BLOOD): IFOBT: NEGATIVE

## 2014-06-12 LAB — POCT GLYCOSYLATED HEMOGLOBIN (HGB A1C): HEMOGLOBIN A1C: 5.7

## 2014-06-12 LAB — LIPID PANEL
CHOLESTEROL: 183 mg/dL (ref 0–200)
HDL: 52 mg/dL (ref >39)
LDL CALC: 115 mg/dL — AB (ref 0–99)
TRIGLYCERIDES: 79 mg/dL (ref <150)
Total CHOL/HDL Ratio: 3.5 Ratio
VLDL: 16 mg/dL (ref 0–40)

## 2014-06-12 MED ORDER — TAMSULOSIN HCL 0.4 MG PO CAPS: 0.4000 mg | ORAL_CAPSULE | Freq: Every day | ORAL | Status: DC

## 2014-06-12 NOTE — Progress Notes (Signed)
   Subjective:    Patient ID: Frank [name], male    DOB: [birth year], 59 y.o.   MRN: [id number]  HPI Frank [name] is a 59 year old male with COPD and tobacco abuse who presents today for routine clinic visit. Please see assessment & plan for documentation of each problem.  Review of Systems  Constitutional: Negative for fever.  Respiratory: Negative for shortness of breath.   Cardiovascular: Negative for chest pain.  Gastrointestinal: Negative for nausea, vomiting, abdominal pain and diarrhea.  Genitourinary: Positive for frequency and difficulty urinating. Negative for dysuria, flank pain and discharge.  Neurological: Negative for dizziness.       Objective:   Physical Exam Constitutional: He is oriented to person, place, and time. He appears well-developed and well-nourished. No distress.  HENT:  Head: Normocephalic and atraumatic.  Eyes: Conjunctivae are normal. Pupils are equal, round, and reactive to light.  Cardiovascular: Normal rate, regular rhythm and normal heart sounds.  Exam reveals no gallop and no friction rub.   No murmur heard. Pulmonary/Chest: Poor airflow bilaterally  Abdominal: Soft. Bowel sounds are normal. He exhibits no distension. There is no tenderness.  Neurological: He is alert and oriented to person, place, and time. No cranial nerve deficit. Coordination normal.  Rectal: Prostate palpated and feels enlarged with smooth contours. Skin: Skin is warm and dry. He is not diaphoretic.  Psychiatric: His behavior is normal.          Assessment & Plan:

## 2014-06-12 NOTE — Assessment & Plan Note (Addendum)
Overview -Feels that he is unable to empty his bladder -Wonders if he might have a urinary tract infection -Feels he has to strain himself while urinating -Reports that he has to urinate about 4 times a day -Denies any fever, chills, polydipsia, dysuria, poor appetite, recent sick illness -Would also like a note explaining to his parole officer why he cannot complete urine drug screens frequently -Urinalysis done prior to interview was reassuring for no bacteria or white blood cells or leukocyte esterase  Assessment -His exam findings consistent with benign prostatic hyperplasia -Neurogenic bladder less likely in the absence of poorly uncontrolled diabetes or any other signs of autonomic dysfunction  Plan -Try tamsulosin 0.4 mg and reassess at follow-up visit -Provided him with a letter explaining his medical condition precludes him from successful urine drug screens and would may warrant multiple attempts

## 2014-06-12 NOTE — Patient Instructions (Addendum)
Please try to bring all your medicines next time. This will help [place] keep you safe from mistakes.   Please take Flomax one tablet 30 minutes after breakfast every day.   Please see me back in 2 weeks to see if things are improving and to revisit your labs.

## 2014-06-12 NOTE — Assessment & Plan Note (Addendum)
Check A1c today  ADDENDUM 06/17/2014  9:01 AM:  -A1c 5.7, reassuring -Lipid panel with total cholesterol/HDL ratio at 3.5 would suggest that he is half the average risk and LDL is at near or above on arrange for ATP 3 classification. Smoking and age certainly modify his lipid goals however in the absence of CHD risk equivalents, his LDL goal would be less than 130 which he is at currently.

## 2014-06-13 LAB — URINALYSIS, ROUTINE W REFLEX MICROSCOPIC
Bilirubin Urine: NEGATIVE
Glucose, UA: NEGATIVE mg/dL
Hgb urine dipstick: NEGATIVE
KETONES UR: NEGATIVE mg/dL
Leukocytes, UA: NEGATIVE
Nitrite: NEGATIVE
PROTEIN: NEGATIVE mg/dL
Specific Gravity, Urine: 1.016 (ref 1.005–1.030)
Urobilinogen, UA: 0.2 mg/dL (ref 0.0–1.0)
pH: 6 (ref 5.0–8.0)

## 2014-06-15 NOTE — Progress Notes (Signed)
Medicine attending: Medical history, presenting problems, physical findings, and medications, reviewed with Dr Charlott Rakes on the day of the patient visit and I concur with his evaluation and management plan.

## 2014-06-17 DIAGNOSIS — Z860100 Personal history of colon polyps, unspecified: Secondary | ICD-10-CM

## 2014-06-17 HISTORY — DX: Personal history of colonic polyps: Z86.010

## 2014-06-17 HISTORY — DX: Personal history of colon polyps, unspecified: Z86.0100

## 2014-06-17 NOTE — Addendum Note (Signed)
Addended by: Riccardo Dubin on: 06/17/2014 09:02 AM   Modules accepted: SmartSet

## 2014-06-17 NOTE — Assessment & Plan Note (Signed)
ADDENDUM 06/17/2014  5:38 PM:  Check FOBT while perform prostate exam and results were otherwise reassuring.

## 2014-06-17 NOTE — Addendum Note (Signed)
Addended by: Riccardo Dubin on: 06/17/2014 05:38 PM   Modules accepted: Miquel Dunn

## 2014-06-18 LAB — POC HEMOCCULT BLD/STL (OFFICE/1-CARD/DIAGNOSTIC): FECAL OCCULT BLD: NEGATIVE

## 2014-06-18 NOTE — Addendum Note (Signed)
Addended by: Riccardo Dubin on: 06/18/2014 09:39 AM   Modules accepted: Orders, SmartSet

## 2014-06-26 ENCOUNTER — Ambulatory Visit (INDEPENDENT_AMBULATORY_CARE_PROVIDER_SITE_OTHER): Payer: 59 | Admitting: Internal Medicine

## 2014-06-26 ENCOUNTER — Encounter: Payer: Self-pay | Admitting: Internal Medicine

## 2014-06-26 VITALS — BP 135/82 | HR 80 | Temp 98.2°F | Ht 66.0 in | Wt 166.9 lb

## 2014-06-26 DIAGNOSIS — R3916 Straining to void: Secondary | ICD-10-CM

## 2014-06-26 DIAGNOSIS — Z720 Tobacco use: Secondary | ICD-10-CM

## 2014-06-26 DIAGNOSIS — Z652 Problems related to release from prison: Secondary | ICD-10-CM

## 2014-06-26 DIAGNOSIS — N401 Enlarged prostate with lower urinary tract symptoms: Secondary | ICD-10-CM

## 2014-06-26 MED ORDER — BUPROPION HCL ER (SR) 150 MG PO TB12: ORAL_TABLET | ORAL | Status: DC

## 2014-06-26 MED ORDER — TAMSULOSIN HCL 0.4 MG PO CAPS: 0.8000 mg | ORAL_CAPSULE | Freq: Every day | ORAL | Status: DC

## 2014-06-26 NOTE — Assessment & Plan Note (Addendum)
Per his parole officer, he needs to have a urine drug screen done today in clinic for his four-month routine check. He is agreeable to completing it today, and we will send him the results by mail.  ADDENDUM 06/29/2014  3:00 PM:  UDS results are otherwise reassuring. Plan to send them to the patient.

## 2014-06-26 NOTE — Assessment & Plan Note (Signed)
Overview -Reports that his symptoms have otherwise improved though intermittently they returned to what they were before the medicine -No longer has to use the restroom at night because of delayed emptying. -He has been taking the medication Flomax 1 tablet every morning 30 minutes after breakfast as instructed at his last visit. -He also reports drinking more water though does not feel it is affecting his issues. -He denies any dizziness, chest pain, hematuria  Assessment Likely secondary to benign prostatic hypertrophy as improved with this medication  Plan -Increased Flomax from 0.4 to 0.8 mg see if they will further improve his symptoms -Advised to return in one month for reassessment

## 2014-06-26 NOTE — Patient Instructions (Signed)
Please try to bring all your medicines next time. This will help [place] keep you safe from mistakes.  Please take Flomax two tablets after breakfast. I have also called in for a refill.  We will also start Zyban today. Please take 1 tablet for the first 3 days then start taking 1 tablet twice daily. Do not smoke while you are taking this medication as it can increase the risk of side effects.   Please come back in 1 month to see how you are doing on this medication.

## 2014-06-26 NOTE — Progress Notes (Signed)
   Subjective:    Patient ID: Frank [name], male    DOB: [birth year], 59 y.o.   MRN: [id number]  HPI Frank [name] is a 59 year old male with COPD, tobacco abuse, history of colonic polyps who presents today for follow-up visit.  Please see assessment & plan for documentation of each problem.   Review of Systems  Constitutional: Negative for fever.  Respiratory: Negative for shortness of breath.   Cardiovascular: Negative for chest pain.  Gastrointestinal: Negative for nausea, vomiting, abdominal pain and diarrhea.  Genitourinary: Positive for difficulty urinating (improved but intermittently present). Negative for dysuria, urgency, hematuria, flank pain and decreased urine volume.  Neurological: Negative for dizziness.       Objective:   Physical Exam Constitutional: He is oriented to person, place, and time. Thin, cachectic appearing. Wearing nasal cannula with oxygen tank.  Head: Normocephalic and atraumatic.  Eyes: Conjunctivae are normal. Pupils are equal, round, and reactive to light.  Cardiovascular: Normal rate, regular rhythm and normal heart sounds.  Exam reveals no gallop and no friction rub.   No murmur heard. Pulmonary/Chest: Poor airflow bilaterally.  Neurological: He is alert and oriented to person, place, and time. Coordination normal.  Skin: Skin is warm and dry. He is not diaphoretic.  Psychiatric: His behavior is normal.          Assessment & Plan:

## 2014-06-26 NOTE — Assessment & Plan Note (Signed)
  Assessment: Progress toward smoking cessation:  smoking less Barriers to progress toward smoking cessation:  stress Comments: He is down to smoking 2-3 cigarettes a day and reports previously smoking a pack per day. -Triggers he feels for him are being at home since there are a lot of repairs, most recently of which included a broken water pipe when he was doing laundry a few days ago. 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 As such, he feels anxious and is tempted to smoke cigarettes. -Anxiety work for him in the past include sucking on hard candy as well as Wellbutrin. -He had no improvement with nicotine patches recalling the quit line. -He is interested in quitting smoking.  Plan: Instruction/counseling given:  I counseled patient on the dangers of tobacco use, advised patient to stop smoking, and reviewed strategies to maximize success. Educational resources provided:    Self management tools provided:    Medications to assist with smoking cessation:  Bupropion (Zyban) Patient agreed to the following self-care plans for smoking cessation: go to the [employer] (https://sc[employer]-book[employer].info/), cut down the number of cigarettes smoked  Other plans:  -Restarted Wellbutrin today and gave him directions to start 150 mg once a day for the next 3 days and then to do it twice a day (every 12 hours) -Advised him not to smoke while taking this medication as it may precipitate neuropsychiatric side effects -Gave him our clinic phone number in case he should experience worsening mood symptoms to speak with on-call provider -Encouraged him to continue using hard candy to conquer his cravings and to try to spend some time outside of his house where possible to relax his anxiety -Plan to follow-up in one month for reassessment

## 2014-06-27 LAB — PRESCRIPTION ABUSE MONITORING 15P, URINE
Amphetamine/Meth: NEGATIVE ng/mL
BUPRENORPHINE, URINE: NEGATIVE ng/mL
Barbiturate Screen, Urine: NEGATIVE ng/mL
Benzodiazepine Screen, Urine: NEGATIVE ng/mL
CANNABINOID SCRN UR: NEGATIVE ng/mL
CARISOPRODOL, URINE: NEGATIVE ng/mL
Cocaine Metabolites: NEGATIVE ng/mL
Creatinine, Urine: 148.81 mg/dL (ref >20.0)
Fentanyl, Ur: NEGATIVE ng/mL
Meperidine, Ur: NEGATIVE ng/mL
Methadone Screen, Urine: NEGATIVE ng/mL
OXYCODONE SCRN UR: NEGATIVE ng/mL
Opiate Screen, Urine: NEGATIVE ng/mL
Propoxyphene: NEGATIVE ng/mL
TRAMADOL UR: NEGATIVE ng/mL
Zolpidem, Urine: NEGATIVE ng/mL

## 2014-06-29 ENCOUNTER — Encounter: Payer: Self-pay | Admitting: Internal Medicine

## 2014-06-29 NOTE — Progress Notes (Signed)
INTERNAL MEDICINE TEACHING ATTENDING ADDENDUM - Cayton Cuevas, MD: I reviewed and discussed at the time of visit with the resident Dr. Patel, the patient's medical history, physical examination, diagnosis and results of pertinent tests and treatment and I agree with the patient's care as documented.  

## 2014-07-03 ENCOUNTER — Telehealth: Payer: Self-pay | Admitting: *Deleted

## 2014-07-03 NOTE — Telephone Encounter (Signed)
Discuss UDS results done 06/26/14. Hilda Blades Geneve Kimpel RN 07/03/14 12N

## 2014-07-08 NOTE — Addendum Note (Signed)
Addended by: Riccardo Dubin on: 07/08/2014 01:12 AM   Modules accepted: SmartSet

## 2014-07-22 ENCOUNTER — Ambulatory Visit (INDEPENDENT_AMBULATORY_CARE_PROVIDER_SITE_OTHER): Payer: 59 | Admitting: Licensed Clinical Social Worker

## 2014-07-22 DIAGNOSIS — F4323 Adjustment disorder with mixed anxiety and depressed mood: Secondary | ICD-10-CM

## 2014-08-17 ENCOUNTER — Telehealth: Payer: Self-pay | Admitting: *Deleted

## 2014-08-17 NOTE — Telephone Encounter (Signed)
A male called for pt - past few weeks has rash and itching on back, legs and stomach. Denies any ticks. Recently started on Flomax - urine output better. No open appt in clinic this week - could stop med for now to see if any improvement. To call if problems with urine output. Name placed on waiting list for cancel appt. Hilda Blades Beatrix Breece RN 08/17/14 12:15PM

## 2014-08-18 ENCOUNTER — Ambulatory Visit (INDEPENDENT_AMBULATORY_CARE_PROVIDER_SITE_OTHER): Payer: 59 | Admitting: Internal Medicine

## 2014-08-18 ENCOUNTER — Encounter: Payer: Self-pay | Admitting: Internal Medicine

## 2014-08-18 VITALS — BP 133/77 | HR 80 | Temp 98.3°F | Ht 66.0 in | Wt 163.6 lb

## 2014-08-18 DIAGNOSIS — R21 Rash and other nonspecific skin eruption: Secondary | ICD-10-CM

## 2014-08-18 NOTE — Progress Notes (Signed)
   Subjective:    Patient ID: Frank [name], male    DOB: 1955-07-26, 59 y.o.   MRN: [id number]  HPI Comments: [name] is a 59 year old male with PMH as below here with c/o of rash.  Please see problem based charting for A&P.    Rash This is a new problem. The current episode started 1 to 4 weeks ago. The problem has been gradually improving since onset. The affected locations include the back, abdomen, left lower leg, left upper leg, right upper leg and right lower leg. The rash is characterized by dryness, itchiness, redness and burning. He was exposed to a new detergent/soap and an ill contact. Pertinent negatives include no anorexia, congestion, diarrhea, fatigue, fever, shortness of breath, sore throat or vomiting. Past treatments include antihistamine. The treatment provided mild relief.      Review of Systems  Constitutional: Negative for fever, chills, appetite change and fatigue.  HENT: Negative for congestion and sore throat.   Respiratory: Negative for shortness of breath.   Cardiovascular: Negative for chest pain and palpitations.  Gastrointestinal: Negative for nausea, vomiting, diarrhea and anorexia.  Genitourinary: Negative for hematuria and difficulty urinating.  Skin: Positive for rash.       Filed Vitals:   08/18/14 0942  BP: 133/77  Pulse: 80  Temp: 98.3 F (36.8 C)  TempSrc: Oral  Height: 5\' 6"  (1.676 m)  Weight: 163 lb 9.6 oz (74.208 kg)  SpO2: 93%   Objective:   Physical Exam  Constitutional: He appears well-developed. No distress.  HENT:  Head: Normocephalic and atraumatic.  Mouth/Throat: Oropharynx is clear and moist. No oropharyngeal exudate.  Eyes: EOM are normal. Pupils are equal, round, and reactive to light.  Neck: Neck supple.  Cardiovascular: Normal rate, regular rhythm and normal heart sounds.  Exam reveals no gallop and no friction rub.   No murmur heard. Pulmonary/Chest: Effort normal. No respiratory distress. He has wheezes. He has  no rales.  Few wheezes RLL  Abdominal: Soft. Bowel sounds are normal. He exhibits no distension and no mass. There is no tenderness. There is no rebound and no guarding.  Musculoskeletal: Normal range of motion. He exhibits no edema or tenderness.  Neurological: He is alert. No cranial nerve deficit.  Skin: Skin is warm and dry. No rash noted. He is not diaphoretic. There is erythema.  No obvious rash, no vesicles or lesions.   There is increased redness to trunk and legs.  Psychiatric: He has a normal mood and affect. His behavior is normal.  Vitals reviewed.         Assessment & Plan:  Please see problem based charting for A&P.

## 2014-08-18 NOTE — Patient Instructions (Signed)
1. Please STOP using Gain detergent.  I think this may be causing your skin irritation.  Re-wash your clothes/sheets/towels in Tide since that is the detergent your normally use and use the same detergent all the time.  Please use mild soaps without added fragrance.  Apply lotion to your skin so it is not dry.  If your rash does not get better after going back to your Tide detergent please come back to see [place].    2. Please take all medications as prescribed.   Please start taking your Flomax again.  I do not think this medication caused your rash because you were taking it long before your rash started.   3. If you have worsening of your symptoms or new symptoms arise, please call the clinic ([phone]), or go to the ER immediately if symptoms are severe.

## 2014-08-18 NOTE — Assessment & Plan Note (Addendum)
Changed laudry detergent at the beginning from Tide to Gain (the store he was in didn't have [place]; but he has always used Tide in the past).  He started po benadryl four days ago and has noticed improvement.  He thought the rash was due to increase dose of flomax (started two months ago) so he stopped this med 3 days ago.  He says he had shingles on his leg last year but I do not see documentation of this in EPIC.  There is no obvious rash today (this is my first time examining this patient and he thinks his skin is redder than usual) but the patient states it is better since he started Benadryl.  Given the timeframe (rash developed about 3-4 weeks ago, he bought Gain 4-5 weeks ago) and generalized distribution of rash and lack of infectious symptoms I think this rash is likely allergy to Gain. - STOP Gain and RESUME Tide - RESUME Flomax (unikely that increased dose caused this rash) - re-wash all clothes/sheets/towels etc with Tide - use mild soaps for bathing and apply lotion after bathing to combat dryness - limit Benadryl and only use prn while he has the itching - return to clinic if symptoms do not improve or worsen despite above changes; if he still thinks it is Flomax causing rash, consider decreased Flomax dose and add Finasteride

## 2014-08-18 NOTE — Progress Notes (Signed)
Internal Medicine Clinic Attending  Case discussed with Dr. Wilson soon after the resident saw the patient.  We reviewed the resident's history and exam and pertinent patient test results.  I agree with the assessment, diagnosis, and plan of care documented in the resident's note.  

## 2014-08-26 ENCOUNTER — Other Ambulatory Visit: Payer: Self-pay | Admitting: Emergency Medicine

## 2014-08-26 ENCOUNTER — Ambulatory Visit (INDEPENDENT_AMBULATORY_CARE_PROVIDER_SITE_OTHER): Payer: 59 | Admitting: Licensed Clinical Social Worker

## 2014-08-26 DIAGNOSIS — F4323 Adjustment disorder with mixed anxiety and depressed mood: Secondary | ICD-10-CM

## 2014-09-17 ENCOUNTER — Other Ambulatory Visit: Payer: Self-pay | Admitting: Internal Medicine

## 2014-09-17 DIAGNOSIS — J449 Chronic obstructive pulmonary disease, unspecified: Secondary | ICD-10-CM

## 2014-09-21 ENCOUNTER — Ambulatory Visit: Payer: Self-pay | Admitting: Emergency Medicine

## 2014-09-22 ENCOUNTER — Ambulatory Visit (INDEPENDENT_AMBULATORY_CARE_PROVIDER_SITE_OTHER): Payer: 59 | Admitting: Licensed Clinical Social Worker

## 2014-09-22 DIAGNOSIS — F4323 Adjustment disorder with mixed anxiety and depressed mood: Secondary | ICD-10-CM | POA: Diagnosis not present

## 2014-10-07 ENCOUNTER — Ambulatory Visit: Payer: 59 | Admitting: Emergency Medicine

## 2014-10-14 ENCOUNTER — Other Ambulatory Visit: Payer: Self-pay | Admitting: *Deleted

## 2014-10-14 MED ORDER — ALBUTEROL SULFATE HFA 108 (90 BASE) MCG/ACT IN AERS: 2.0000 | INHALATION_SPRAY | RESPIRATORY_TRACT | Status: DC | PRN

## 2014-10-14 NOTE — Telephone Encounter (Signed)
As this patient was seen by [name] on 03/11/14 and deemed necessary for their treatment as documented in the EHR, I will refill this prescription for albuterol.

## 2014-10-15 ENCOUNTER — Telehealth: Payer: Self-pay | Admitting: Emergency Medicine

## 2014-10-15 MED ORDER — ALBUTEROL SULFATE HFA 108 (90 BASE) MCG/ACT IN AERS: 2.0000 | INHALATION_SPRAY | RESPIRATORY_TRACT | Status: DC | PRN

## 2014-10-15 NOTE — Telephone Encounter (Signed)
Spoke with pt. He is needing a refill on Ventolin. This has been sent to his pharmacy. Nothing further was needed.

## 2014-10-20 ENCOUNTER — Ambulatory Visit: Payer: 59 | Admitting: Licensed Clinical Social Worker

## 2014-10-20 ENCOUNTER — Ambulatory Visit (INDEPENDENT_AMBULATORY_CARE_PROVIDER_SITE_OTHER): Payer: 59 | Admitting: Licensed Clinical Social Worker

## 2014-10-20 DIAGNOSIS — F4323 Adjustment disorder with mixed anxiety and depressed mood: Secondary | ICD-10-CM | POA: Diagnosis not present

## 2014-10-29 ENCOUNTER — Ambulatory Visit (INDEPENDENT_AMBULATORY_CARE_PROVIDER_SITE_OTHER): Payer: 59 | Admitting: Emergency Medicine

## 2014-10-29 ENCOUNTER — Encounter: Payer: Self-pay | Admitting: Emergency Medicine

## 2014-10-29 VITALS — BP 110/74 | HR 81 | Ht 66.0 in | Wt 164.0 lb

## 2014-10-29 DIAGNOSIS — Z720 Tobacco use: Secondary | ICD-10-CM

## 2014-10-29 DIAGNOSIS — J449 Chronic obstructive pulmonary disease, unspecified: Secondary | ICD-10-CM

## 2014-10-29 NOTE — Patient Instructions (Signed)
Please continue same medications as you have been using them Please continue to work hard on cutting down your cigarettes Wear you oxygen with all exertion.  Follow with Dr Lamonte Sakai in 4 months or sooner if you have any problems.

## 2014-10-29 NOTE — Assessment & Plan Note (Signed)
Remain symptomatic but has not had any acute exacerbations. At this point I will leave smoking cessation is the most important new intervention to make. I will continue his current bronchodilators. Follow-up this fall

## 2014-10-29 NOTE — Assessment & Plan Note (Signed)
Discussed this in detail today. He continues to smoke half a pack a day. We talked about strategies to cut down. He will work on this. If he's made any progress at our next visit then I will talk to him about setting a quit date

## 2014-10-29 NOTE — Progress Notes (Signed)
Subjective:    Patient ID: Frank [name], male    DOB: 1955/06/05, 59 y.o.   MRN: [id number]  HPI 59 yo smoker admitted 06/05/12 for COPD exacerbation .  PCCM w/ initial consult   06/24/12 -- [facility] follow up  Pt w/ progressive cough, dyspnea and wheezing 4 days PTA . Admitted started on aggressive pulmonary hygiene regimen w/ nebs, steroids and abx.  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  Has FH of ? Lung dz -parent deceased.   Since discharge he is feeling better Less dyspnea and cough  Started on advair and spiriva at discharge Finished steroids taper.  Walking sats in office w/out desat .  Wants to go back to work.  No hemoptysis , chest pain or edema  CXR showed COPD changes .  No previous dx of COPD , no PFT in past.    ROV 08/30/12 -- COPD, seen by me as hosp consult in 1/'14 when he was admitted for AE. Returns today after PFT >> severe AFL, hyperinflation, decreased DLCO. He is on advair + spiriva. Uses albuterol once in the am. He feels that he is getting back to baseline - actually better fxn than before he was hospitalized. He was discharged on 3L/min at all times, he has been using prn. 3 weeks ago he had nasal congestion, treated symptomatically. No wheeze, rare cough, produces clear mucous.   ROV 12/31/12 -- follow up for GOLD b COPD, managed on Spiriva + Advair. He feels much better. Has NOT restarted smoking. He used to average 1 AE a year. He is UTD on pneumovax. His functional capacity is good w walking, but some limitation with heavier work. 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 No wheeze.   ROV 05/14/13 -- follow up for GOLD b COPD, managed on Spiriva + Advair. He has been doing well, no flares since last visit. He has been compliant with his inhaled meds. He rarely has exertional limitation - only w heavy exercise. No wheeze or cough. Uses ventolin most mornings, sometimes twice a day.    ROV 11/13/13 -- follow up for GOLD c COPD, managed on Spiriva + Advair. He returns for a regular f/u. He believes that his breathing is close to the same, but he also describes needing to rest with carrying laundry, difficulty in the humidity of the shower. He wonders about whether he should try to get disability.  He no longer works, but he is active.   ROV 12/26/13 -- follows for COPD. Last time we changed Advair/ Spiriva to Anoro.  Not really sure that The Anoro has helped much. He is smoking again - about 1/2 pk a day. Uses SABA 2x a day.   ROV 03/11/14 -- hx of COPD, seen by TP 10/19 and treated with doxycycline; he had been seen earlier in the ED. Continues to smoke. Currently on Anoro. He is averaging 1 flare a year, usually in the Fall.  Due for Flu shot.   ROV 10/29/14 -- Follow-up visit for COPD, currently managed on Anoro. Uses albuterol when he doesn't recover with resting, a few times a week. He has occasional wheeze, exertional SOB. He often takes his O2 off to exert. He can sometimes exert, mow yard, etc. Without any trouble. Occasional cough.  He has cut down to 0.5pk/day. He tolerates Anoro.    CAT Score 12/31/2012 08/30/2012  Total CAT Score 9 7  PULMONARY FUNCTON TEST 08/30/2012  FVC 3.93  FEV1 1.69  FEV1/FVC 43  FVC  % Predicted 93  FEV % Predicted 55  FeF 25-75 .71  FeF 25-75 % Predicted 3.05    Review of Systems     Objective:   Physical Exam Filed Vitals:   10/29/14 1516 10/29/14 1517  BP:  110/74  Pulse:  81  Height: 5\' 6"  (1.676 m)   Weight: 164 lb (74.39 kg)   SpO2:  96%   GEN: A/Ox3; pleasant , NAD, thin   HEENT:  [place]/AT,  EACs-clear, TMs-wnl, NOSE-clear, THROAT-clear, no lesions, no postnasal drip or exudate noted, missing several teeth   NECK:  Supple w/ fair ROM; no JVD; normal carotid impulses w/o bruits; no thyromegaly or nodules palpated; no lymphadenopathy.  RESP  Clear  P & A; distant but no wheezes/ rales/ or rhonchi.no accessory muscle  use  CARD:  RRR, no m/r/g  , no peripheral edema, pulses intact, no cyanosis or clubbing.  Musco: Warm bil, no deformities or joint swelling noted.   Neuro: alert, no focal deficits noted.    Skin: Warm, no lesions or rashes      Assessment & Plan:  Tobacco abuse Discussed this in detail today. He continues to smoke half a pack a day. We talked about strategies to cut down. He will work on this. If he's made any progress at our next visit then I will talk to him about setting a quit date  COPD (chronic obstructive pulmonary disease) Remain symptomatic but has not had any acute exacerbations. At this point I will leave smoking cessation is the most important new intervention to make. I will continue his current bronchodilators. Follow-up this fall

## 2014-11-23 ENCOUNTER — Ambulatory Visit (INDEPENDENT_AMBULATORY_CARE_PROVIDER_SITE_OTHER): Payer: 59 | Admitting: Licensed Clinical Social Worker

## 2014-11-23 DIAGNOSIS — F4323 Adjustment disorder with mixed anxiety and depressed mood: Secondary | ICD-10-CM | POA: Diagnosis not present

## 2014-12-01 ENCOUNTER — Telehealth: Payer: Self-pay | Admitting: Emergency Medicine

## 2014-12-01 MED ORDER — ALBUTEROL SULFATE 0.63 MG/3ML IN NEBU: 1.0000 | INHALATION_SOLUTION | Freq: Four times a day (QID) | RESPIRATORY_TRACT | Status: DC

## 2014-12-01 MED ORDER — UMECLIDINIUM-VILANTEROL 62.5-25 MCG/INH IN AEPB: INHALATION_SPRAY | RESPIRATORY_TRACT | Status: DC

## 2014-12-01 MED ORDER — ALBUTEROL SULFATE HFA 108 (90 BASE) MCG/ACT IN AERS: 2.0000 | INHALATION_SPRAY | RESPIRATORY_TRACT | Status: DC | PRN

## 2014-12-01 NOTE — Telephone Encounter (Signed)
Rx's refilled to [facility]  Pt requested Anoro, Albuterol neb and Ventolin hfa Nothing further needed.

## 2014-12-01 NOTE — Telephone Encounter (Signed)
lmomtcb x1 

## 2014-12-21 ENCOUNTER — Ambulatory Visit (INDEPENDENT_AMBULATORY_CARE_PROVIDER_SITE_OTHER): Payer: 59 | Admitting: Licensed Clinical Social Worker

## 2014-12-21 DIAGNOSIS — F4323 Adjustment disorder with mixed anxiety and depressed mood: Secondary | ICD-10-CM | POA: Diagnosis not present

## 2014-12-30 ENCOUNTER — Other Ambulatory Visit: Payer: Self-pay | Admitting: Emergency Medicine

## 2015-01-19 ENCOUNTER — Other Ambulatory Visit: Payer: Self-pay | Admitting: Critical Care Medicine

## 2015-01-21 ENCOUNTER — Encounter: Payer: Self-pay | Admitting: Internal Medicine

## 2015-01-21 NOTE — Progress Notes (Signed)
Patient ID: Frank [name], male   DOB: 1955-10-09, 59 y.o.   MRN: [id number]  I have reviewed the office notes from [place]  for the office visit dated 01/19/15:  Confirmation of flu vaccine administration.  I will update his immunization history accordingly in Epic.

## 2015-01-25 ENCOUNTER — Ambulatory Visit (INDEPENDENT_AMBULATORY_CARE_PROVIDER_SITE_OTHER): Payer: 59 | Admitting: Licensed Clinical Social Worker

## 2015-01-25 DIAGNOSIS — F4323 Adjustment disorder with mixed anxiety and depressed mood: Secondary | ICD-10-CM

## 2015-02-19 ENCOUNTER — Ambulatory Visit (INDEPENDENT_AMBULATORY_CARE_PROVIDER_SITE_OTHER)
Admission: RE | Admit: 2015-02-19 | Discharge: 2015-02-19 | Disposition: A | Discharge status: Home / Self Care | Payer: 59 | Source: Ambulatory Visit | Attending: Adult Health | Admitting: Adult Health

## 2015-02-19 ENCOUNTER — Encounter: Payer: Self-pay | Admitting: Adult Health

## 2015-02-19 ENCOUNTER — Ambulatory Visit (INDEPENDENT_AMBULATORY_CARE_PROVIDER_SITE_OTHER): Payer: 59 | Admitting: Adult Health

## 2015-02-19 VITALS — BP 122/76 | HR 75 | Temp 97.9°F | Ht 68.0 in | Wt 167.8 lb

## 2015-02-19 DIAGNOSIS — J449 Chronic obstructive pulmonary disease, unspecified: Secondary | ICD-10-CM | POA: Diagnosis not present

## 2015-02-19 DIAGNOSIS — Z720 Tobacco use: Secondary | ICD-10-CM | POA: Diagnosis not present

## 2015-02-19 MED ORDER — PREDNISONE 10 MG PO TABS: ORAL_TABLET | ORAL | Status: DC

## 2015-02-19 NOTE — Patient Instructions (Signed)
Prednisone taper over next week.  Mucinex DM Twice daily  As needed cough/congestion .  Continue on ANORO daily  Work on not smoking.  Chest xray today .  follow up [name]  In 3-4 months and As needed   Please contact office for sooner follow up if symptoms do not improve or worsen or seek emergency care

## 2015-02-19 NOTE — Progress Notes (Signed)
Subjective:    Patient ID: Frank [name], male    DOB: 1956/02/16, 59 y.o.   MRN: [id number]  HPI 59yo smoker admitted 06/05/12 for COPD exacerbation .  PCCM w/ initial consult   06/24/12 -- [facility] follow up  Pt w/ progressive cough, dyspnea and wheezing 4 days PTA . Admitted started on aggressive pulmonary hygiene regimen w/ nebs, steroids and abx.  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  Has FH of ? Lung dz -parent deceased.   Since discharge he is feeling better Less dyspnea and cough  Started on advair and spiriva at discharge Finished steroids taper.  Walking sats in office w/out desat .  Wants to go back to work.  No hemoptysis , chest pain or edema  CXR showed COPD changes .  No previous dx of COPD , no PFT in past.    ROV 08/30/12 -- COPD, seen by me as hosp consult in 1/'14 when he was admitted for AE. Returns today after PFT >> severe AFL, hyperinflation, decreased DLCO. He is on advair + spiriva. Uses albuterol once in the am. He feels that he is getting back to baseline - actually better fxn than before he was hospitalized. He was discharged on 3L/min at all times, he has been using prn. 3 weeks ago he had nasal congestion, treated symptomatically. No wheeze, rare cough, produces clear mucous.   ROV 12/31/12 -- follow up for GOLD b COPD, managed on Spiriva + Advair. He feels much better. Has NOT restarted smoking. He used to average 1 AE a year. He is UTD on pneumovax. His functional capacity is good w walking, but some limitation with heavier work. 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 No wheeze.   ROV 05/14/13 -- follow up for GOLD b COPD, managed on Spiriva + Advair. He has been doing well, no flares since last visit. He has been compliant with his inhaled meds. He rarely has exertional limitation - only w heavy exercise. No wheeze or cough. Uses ventolin most mornings, sometimes twice a day.    ROV 11/13/13 -- follow up for GOLD c COPD, managed on Spiriva + Advair. He returns for a regular f/u. He believes that his breathing is close to the same, but he also describes needing to rest with carrying laundry, difficulty in the humidity of the shower. He wonders about whether he should try to get disability.  He no longer works, but he is active.   ROV 12/26/13 -- follows for COPD. Last time we changed Advair/ Spiriva to Anoro.  Not really sure that The Anoro has helped much. He is smoking again - about 1/2 pk a day. Uses SABA 2x a day.   ROV 03/11/14 -- hx of COPD, seen by TP 10/19 and treated with doxycycline; he had been seen earlier in the ED. Continues to smoke. Currently on Anoro. He is averaging 1 flare a year, usually in the Fall.  Due for Flu shot.   ROV 10/29/14 -- Follow-up visit for COPD, currently managed on Anoro. Uses albuterol when he doesn't recover with resting, a few times a week. He has occasional wheeze, exertional SOB. He often takes his O2 off to exert. He can sometimes exert, mow yard, etc. Without any trouble. Occasional cough.  He has cut down to 0.5pk/day. He tolerates Anoro.   02/19/15 Follow up : COPD smoker  Pt returns for  4 month follow up.  Says doing okay until couple of weeks ago .  Pt c/o prod cough with white thick mucus, chest congestion, SOB at times, and sinus pressure.  Denies any fever, nasusa or vomitting, and sinus drainage. Still smoking , discussed cessation . No hemoptysis .  Last cxr >1 yr ago.  Remains on ANORO daily  Uses mucinex with some help.      Review of Systems  Constitutional:   No  weight loss, night sweats,  Fevers, chills, fatigue, or  lassitude.  HEENT:   No headaches,  Difficulty swallowing,  Tooth/dental problems, or  Sore throat,                No sneezing, itching, ear ache,  +nasal congestion, post nasal drip,   CV:  No chest pain,  Orthopnea, PND, swelling in lower extremities, anasarca, dizziness, palpitations,  syncope.   GI  No heartburn, indigestion, abdominal pain, nausea, vomiting, diarrhea, change in bowel habits, loss of appetite, bloody stools.   Resp:    No chest wall deformity  Skin: no rash or lesions.  GU: no dysuria, change in color of urine, no urgency or frequency.  No flank pain, no hematuria   MS:  No joint pain or swelling.  No decreased range of motion.  No back pain.  Psych:  No change in mood or affect. No depression or anxiety.  No memory loss.          Objective:   Physical Exam Filed Vitals:   02/19/15 1200  BP: 122/76  Pulse: 75  Temp: 97.9 F (36.6 C)  TempSrc: Oral  Height: 5\' 8"  (1.727 m)  Weight: 167 lb 12.8 oz (76.114 kg)  SpO2: 97%   GEN: A/Ox3; pleasant , NAD, thin   HEENT:  [place]/AT,  EACs-clear, TMs-wnl, NOSE-clear, THROAT-clear, no lesions, no postnasal drip or exudate noted, missing several teeth   NECK:  Supple w/ fair ROM; no JVD; normal carotid impulses w/o bruits; no thyromegaly or nodules palpated; no lymphadenopathy.  RESP  Decreased BS in bases no wheezes/ rales/ or rhonchi.no accessory muscle use  CARD:  RRR, no m/r/g  , no peripheral edema, pulses intact, no cyanosis or clubbing.  Musco: Warm bil, no deformities or joint swelling noted.   Neuro: alert, no focal deficits noted.    Skin: Warm, no lesions or rashes      Assessment & Plan:  No problem-specific assessment & plan notes found for this encounter.

## 2015-02-22 ENCOUNTER — Ambulatory Visit (INDEPENDENT_AMBULATORY_CARE_PROVIDER_SITE_OTHER): Payer: 59 | Admitting: Licensed Clinical Social Worker

## 2015-02-22 DIAGNOSIS — F4323 Adjustment disorder with mixed anxiety and depressed mood: Secondary | ICD-10-CM

## 2015-02-22 NOTE — Progress Notes (Signed)
Quick Note:  LVM for pt to return call ______ 

## 2015-02-22 NOTE — Progress Notes (Signed)
Quick Note:  Called and spoke with pt. Reviewed results and recs. Pt voiced understanding and had no further questions. ______ 

## 2015-02-24 NOTE — Assessment & Plan Note (Signed)
Smoking cessation discussed 

## 2015-02-24 NOTE — Assessment & Plan Note (Signed)
Flare in active smoker  Check cxr   Plan  Prednisone taper over next week.  Mucinex DM Twice daily  As needed cough/congestion .  Continue on ANORO daily  Work on not smoking.  Chest xray today .  follow up [name]  In 3-4 months and As needed   Please contact office for sooner follow up if symptoms do not improve or worsen or seek emergency care

## 2015-03-03 ENCOUNTER — Other Ambulatory Visit: Payer: Self-pay | Admitting: Emergency Medicine

## 2015-03-04 ENCOUNTER — Telehealth: Payer: Self-pay | Admitting: *Deleted

## 2015-03-04 NOTE — Telephone Encounter (Signed)
Initiated PA for Proventil HFA thru Cover My Meds. Key: [place] for review. Will await response.

## 2015-03-05 NOTE — Telephone Encounter (Signed)
[employer] is still processing your PA request. You may close this dialog, return to your dashboard, and perform other tasks. You will receive an electronic determination in CoverMyMeds within 24-72 hours. You can see the latest determination by locating this request on your dashboard or by reopening this request. You will also receive a faxed copy of the determination ---  Will forward to RB's nurse to follow up on.

## 2015-03-08 NOTE — Telephone Encounter (Signed)
PA still in process.  

## 2015-03-10 NOTE — Telephone Encounter (Signed)
Proventil HFA has been approved until 03/03/2016. Pharmacy informed. Nothing further needed. [id number]

## 2015-03-11 ENCOUNTER — Ambulatory Visit: Payer: Self-pay | Admitting: Emergency Medicine

## 2015-03-22 ENCOUNTER — Ambulatory Visit (INDEPENDENT_AMBULATORY_CARE_PROVIDER_SITE_OTHER): Payer: 59 | Admitting: Licensed Clinical Social Worker

## 2015-03-22 DIAGNOSIS — F4323 Adjustment disorder with mixed anxiety and depressed mood: Secondary | ICD-10-CM

## 2015-04-20 ENCOUNTER — Other Ambulatory Visit: Payer: Self-pay | Admitting: Emergency Medicine

## 2015-04-21 ENCOUNTER — Ambulatory Visit (INDEPENDENT_AMBULATORY_CARE_PROVIDER_SITE_OTHER): Payer: 59 | Admitting: Licensed Clinical Social Worker

## 2015-04-21 DIAGNOSIS — F4323 Adjustment disorder with mixed anxiety and depressed mood: Secondary | ICD-10-CM | POA: Diagnosis not present

## 2015-05-18 ENCOUNTER — Ambulatory Visit: Payer: 59 | Admitting: Licensed Clinical Social Worker

## 2015-05-19 ENCOUNTER — Ambulatory Visit (INDEPENDENT_AMBULATORY_CARE_PROVIDER_SITE_OTHER): Payer: BLUE CROSS/BLUE SHIELD | Admitting: Licensed Clinical Social Worker

## 2015-05-19 DIAGNOSIS — F4323 Adjustment disorder with mixed anxiety and depressed mood: Secondary | ICD-10-CM | POA: Diagnosis not present

## 2015-05-20 ENCOUNTER — Other Ambulatory Visit: Payer: Self-pay | Admitting: Emergency Medicine

## 2015-05-25 ENCOUNTER — Ambulatory Visit (INDEPENDENT_AMBULATORY_CARE_PROVIDER_SITE_OTHER): Payer: BLUE CROSS/BLUE SHIELD | Admitting: Emergency Medicine

## 2015-05-25 ENCOUNTER — Encounter: Payer: Self-pay | Admitting: Emergency Medicine

## 2015-05-25 VITALS — BP 120/72 | HR 69 | Ht 66.0 in | Wt 168.0 lb

## 2015-05-25 DIAGNOSIS — J449 Chronic obstructive pulmonary disease, unspecified: Secondary | ICD-10-CM | POA: Diagnosis not present

## 2015-05-25 DIAGNOSIS — Z720 Tobacco use: Secondary | ICD-10-CM

## 2015-05-25 NOTE — Assessment & Plan Note (Signed)
He has not smoked for 2 weeks, congratulated and supported him

## 2015-05-25 NOTE — Assessment & Plan Note (Signed)
Good job with stopping smoking! Keep up the good work. Do not restart! Continue Anoro daily Albuterol nebulizer as needed Use albuterol 2 puffs as needed.  Flu shot up to date.  Follow with Dr Lamonte Sakai in 6 months or sooner if you have any problems

## 2015-05-25 NOTE — Patient Instructions (Addendum)
Good job with stopping smoking! Keep up the good work. Do not restart! Continue Anoro daily Albuterol nebulizer as needed Use albuterol 2 puffs as needed.  Flu shot up to date.  Follow with Dr Lamonte Sakai in 6 months or sooner if you have any problems

## 2015-05-25 NOTE — Progress Notes (Signed)
Subjective:    Patient ID: Frank [name], male    DOB: 1955-11-08, 60 y.o.   MRN: [id number]  HPI 60 yo smoker admitted 06/05/12 for COPD exacerbation .  PCCM w/ initial consult   06/24/12 -- [facility] follow up  Pt w/ progressive cough, dyspnea and wheezing 4 days PTA . Admitted started on aggressive pulmonary hygiene regimen w/ nebs, steroids and abx.  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  Has FH of ? Lung dz -parent deceased.   Since discharge he is feeling better Less dyspnea and cough  Started on advair and spiriva at discharge Finished steroids taper.  Walking sats in office w/out desat .  Wants to go back to work.  No hemoptysis , chest pain or edema  CXR showed COPD changes .  No previous dx of COPD , no PFT in past.    ROV 08/30/12 -- COPD, seen by me as hosp consult in 1/'14 when he was admitted for AE. Returns today after PFT >> severe AFL, hyperinflation, decreased DLCO. He is on advair + spiriva. Uses albuterol once in the am. He feels that he is getting back to baseline - actually better fxn than before he was hospitalized. He was discharged on 3L/min at all times, he has been using prn. 3 weeks ago he had nasal congestion, treated symptomatically. No wheeze, rare cough, produces clear mucous.   ROV 12/31/12 -- follow up for GOLD b COPD, managed on Spiriva + Advair. He feels much better. Has NOT restarted smoking. He used to average 1 AE a year. He is UTD on pneumovax. His functional capacity is good w walking, but some limitation with heavier work. 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 No wheeze.   ROV 05/14/13 -- follow up for GOLD b COPD, managed on Spiriva + Advair. He has been doing well, no flares since last visit. He has been compliant with his inhaled meds. He rarely has exertional limitation - only w heavy exercise. No wheeze or cough. Uses ventolin most mornings, sometimes twice a day.    ROV 11/13/13 -- follow up for GOLD c COPD, managed on Spiriva + Advair. He returns for a regular f/u. He believes that his breathing is close to the same, but he also describes needing to rest with carrying laundry, difficulty in the humidity of the shower. He wonders about whether he should try to get disability.  He no longer works, but he is active.   ROV 12/26/13 -- follows for COPD. Last time we changed Advair/ Spiriva to Anoro.  Not really sure that The Anoro has helped much. He is smoking again - about 1/2 pk a day. Uses SABA 2x a day.   ROV 03/11/14 -- hx of COPD, seen by TP 10/19 and treated with doxycycline; he had been seen earlier in the ED. Continues to smoke. Currently on Anoro. He is averaging 1 flare a year, usually in the Fall.  Due for Flu shot.   ROV 10/29/14 -- Follow-up visit for COPD, currently managed on Anoro. Uses albuterol when he doesn't recover with resting, a few times a week. He has occasional wheeze, exertional SOB. He often takes his O2 off to exert. He can sometimes exert, mow yard, etc. Without any trouble. Occasional cough.  He has cut down to 0.5pk/day. He tolerates Anoro.   ROV 05/25/15 -- follow-up visit for severe COPD. He was last seen by TP  in October 2016. She treated him for an acute exacerbation with prednisone taper, Mucinex. He continues to smoke. He is using Anoro daily.  He has been doing well since that visit. He has the same exertional tolerance, has more trouble outside especially in extreme temperatures, humidity. He is using SABA three times a day. Still does some yard work. He coughs occasionally but not every day. Blows a lot of nasal mucous. He is on mucinex. Able to walk 75 ft.    CAT Score 05/25/2015 12/31/2012 08/30/2012  Total CAT Score 8 9 7     PULMONARY FUNCTON TEST 08/30/2012  FVC 3.93  FEV1 1.69  FEV1/FVC 43  FVC  % Predicted 93  FEV % Predicted 55  FeF 25-75 .71  FeF 25-75 % Predicted 3.05    Review of Systems     Objective:    Physical Exam Filed Vitals:   05/25/15 1333 05/25/15 1334  BP:  120/72  Pulse:  69  Height: 5\' 6"  (1.676 m)   Weight: 168 lb (76.204 kg)   SpO2:  96%   Gen: Pleasant, well-nourished, in no distress,  normal affect  ENT: No lesions,  mouth clear,  oropharynx clear, no postnasal drip  Neck: No JVD, no TMG, no carotid bruits  Lungs: No use of accessory muscles, no dullness to percussion, clear without rales or rhonchi  Cardiovascular: RRR, heart sounds normal, no murmur or gallops, no peripheral edema  Musculoskeletal: No deformities, no cyanosis or clubbing  Neuro: alert, non focal  Skin: Warm, no lesions or rashes     Assessment & Plan:  COPD (chronic obstructive pulmonary disease) Good job with stopping smoking! Keep up the good work. Do not restart! Continue Anoro daily Albuterol nebulizer as needed Use albuterol 2 puffs as needed.  Flu shot up to date.  Follow with Dr Lamonte Sakai in 6 months or sooner if you have any problems  Tobacco abuse He has not smoked for 2 weeks, congratulated and supported him

## 2015-05-26 ENCOUNTER — Other Ambulatory Visit: Payer: Self-pay | Admitting: Emergency Medicine

## 2015-05-27 ENCOUNTER — Telehealth: Payer: Self-pay | Admitting: Emergency Medicine

## 2015-05-27 NOTE — Telephone Encounter (Signed)
Spoke with pt, states he was supposed to have a nasal spray called in to pharmacy after ov on 05/25/15 but it was not called in. No nasal spray on pt's med list, no nasal spray listed on pt's instructions from ov. Pt aware that RB is out of the office the rest of the week, but wants to double check with RB to see if he needs a nasal spray.  Pt uses [facility] on [address]   RB please advise.  Thanks.

## 2015-05-31 MED ORDER — FLUTICASONE PROPIONATE 50 MCG/ACT NA SUSP: 2.0000 | Freq: Every day | NASAL | Status: DC

## 2015-05-31 NOTE — Telephone Encounter (Signed)
I wanted him to start fluticasone nasal spray, 2 sprays each side qd

## 2015-05-31 NOTE — Telephone Encounter (Signed)
lmomtcb x1 

## 2015-05-31 NOTE — Telephone Encounter (Signed)
Rx has been sent in per RB. Pt is aware. Nothing further was needed. 

## 2015-06-15 ENCOUNTER — Ambulatory Visit (INDEPENDENT_AMBULATORY_CARE_PROVIDER_SITE_OTHER): Payer: BLUE CROSS/BLUE SHIELD | Admitting: Licensed Clinical Social Worker

## 2015-06-15 DIAGNOSIS — F4323 Adjustment disorder with mixed anxiety and depressed mood: Secondary | ICD-10-CM | POA: Diagnosis not present

## 2015-07-13 ENCOUNTER — Ambulatory Visit (INDEPENDENT_AMBULATORY_CARE_PROVIDER_SITE_OTHER): Payer: BLUE CROSS/BLUE SHIELD | Admitting: Licensed Clinical Social Worker

## 2015-07-13 DIAGNOSIS — F4323 Adjustment disorder with mixed anxiety and depressed mood: Secondary | ICD-10-CM | POA: Diagnosis not present

## 2015-07-14 ENCOUNTER — Other Ambulatory Visit: Payer: Self-pay | Admitting: Emergency Medicine

## 2015-08-10 ENCOUNTER — Ambulatory Visit (INDEPENDENT_AMBULATORY_CARE_PROVIDER_SITE_OTHER): Payer: BLUE CROSS/BLUE SHIELD | Admitting: Licensed Clinical Social Worker

## 2015-08-10 DIAGNOSIS — F4323 Adjustment disorder with mixed anxiety and depressed mood: Secondary | ICD-10-CM | POA: Diagnosis not present

## 2015-10-18 ENCOUNTER — Ambulatory Visit (INDEPENDENT_AMBULATORY_CARE_PROVIDER_SITE_OTHER): Payer: BLUE CROSS/BLUE SHIELD | Admitting: Licensed Clinical Social Worker

## 2015-11-17 ENCOUNTER — Other Ambulatory Visit: Payer: Self-pay | Admitting: Emergency Medicine

## 2015-11-22 ENCOUNTER — Ambulatory Visit (INDEPENDENT_AMBULATORY_CARE_PROVIDER_SITE_OTHER): Payer: No Typology Code available for payment source | Admitting: Licensed Clinical Social Worker

## 2015-11-22 DIAGNOSIS — F4322 Adjustment disorder with anxiety: Secondary | ICD-10-CM | POA: Diagnosis not present

## 2015-11-30 ENCOUNTER — Encounter: Payer: Self-pay | Admitting: Emergency Medicine

## 2015-11-30 ENCOUNTER — Ambulatory Visit (INDEPENDENT_AMBULATORY_CARE_PROVIDER_SITE_OTHER): Payer: Medicare Other | Admitting: Emergency Medicine

## 2015-11-30 DIAGNOSIS — J449 Chronic obstructive pulmonary disease, unspecified: Secondary | ICD-10-CM

## 2015-11-30 NOTE — Patient Instructions (Signed)
Please continue Anoro as you have been taking it Use albuterol 2 puffs as needed for shortness of breath Continue oxygen at 3 L/m with exertion Congratulations on stopping smoking!! Get flu shot in the fall Follow with Dr Lamonte Sakai in 6 months or sooner if you have any problems

## 2015-11-30 NOTE — Progress Notes (Signed)
Subjective:    Patient ID: Frank [name], male    DOB: 1955-11-08, 60 y.o.   MRN: [id number]  HPI 60 yo smoker admitted 06/05/12 for COPD exacerbation .  PCCM w/ initial consult   06/24/12 -- [facility] follow up  Pt w/ progressive cough, dyspnea and wheezing 4 days PTA . Admitted started on aggressive pulmonary hygiene regimen w/ nebs, steroids and abx.  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  Has FH of ? Lung dz -parent deceased.   Since discharge he is feeling better Less dyspnea and cough  Started on advair and spiriva at discharge Finished steroids taper.  Walking sats in office w/out desat .  Wants to go back to work.  No hemoptysis , chest pain or edema  CXR showed COPD changes .  No previous dx of COPD , no PFT in past.    ROV 08/30/12 -- COPD, seen by me as hosp consult in 1/'14 when he was admitted for AE. Returns today after PFT >> severe AFL, hyperinflation, decreased DLCO. He is on advair + spiriva. Uses albuterol once in the am. He feels that he is getting back to baseline - actually better fxn than before he was hospitalized. He was discharged on 3L/min at all times, he has been using prn. 3 weeks ago he had nasal congestion, treated symptomatically. No wheeze, rare cough, produces clear mucous.   ROV 12/31/12 -- follow up for GOLD b COPD, managed on Spiriva + Advair. He feels much better. Has NOT restarted smoking. He used to average 1 AE a year. He is UTD on pneumovax. His functional capacity is good w walking, but some limitation with heavier work. 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 No wheeze.   ROV 05/14/13 -- follow up for GOLD b COPD, managed on Spiriva + Advair. He has been doing well, no flares since last visit. He has been compliant with his inhaled meds. He rarely has exertional limitation - only w heavy exercise. No wheeze or cough. Uses ventolin most mornings, sometimes twice a day.    ROV 11/13/13 -- follow up for GOLD c COPD, managed on Spiriva + Advair. He returns for a regular f/u. He believes that his breathing is close to the same, but he also describes needing to rest with carrying laundry, difficulty in the humidity of the shower. He wonders about whether he should try to get disability.  He no longer works, but he is active.   ROV 12/26/13 -- follows for COPD. Last time we changed Advair/ Spiriva to Anoro.  Not really sure that The Anoro has helped much. He is smoking again - about 1/2 pk a day. Uses SABA 2x a day.   ROV 03/11/14 -- hx of COPD, seen by TP 10/19 and treated with doxycycline; he had been seen earlier in the ED. Continues to smoke. Currently on Anoro. He is averaging 1 flare a year, usually in the Fall.  Due for Flu shot.   ROV 10/29/14 -- Follow-up visit for COPD, currently managed on Anoro. Uses albuterol when he doesn't recover with resting, a few times a week. He has occasional wheeze, exertional SOB. He often takes his O2 off to exert. He can sometimes exert, mow yard, etc. Without any trouble. Occasional cough.  He has cut down to 0.5pk/day. He tolerates Anoro.   ROV 05/25/15 -- follow-up visit for severe COPD. He was last seen by TP  in October 2016. She treated him for an acute exacerbation with prednisone taper, Mucinex. He continues to smoke. He is using Anoro daily.  He has been doing well since that visit. He has the same exertional tolerance, has more trouble outside especially in extreme temperatures, humidity. He is using SABA three times a day. Still does some yard work. He coughs occasionally but not every day. Blows a lot of nasal mucous. He is on mucinex. Able to walk 75 ft.   ROV 11/30/15 -- patient with a history of severe COPD, chronic rhinitis.  He stopped smoking last time, has not restarted. He has been having more dyspnea during the hot weather. Rare wheeze, rare cough. He is compliant with his O2 at 3L/min w exertion. On Anoro, tolerates well  and believes that it is helping him. He is on flonase nasal spray, helps his congestion. Occasionally uses mucinex. No exacerbations.    CAT Score 05/25/2015 12/31/2012 08/30/2012  Total CAT Score 8 9 7     PULMONARY FUNCTON TEST 08/30/2012  FVC 3.93  FEV1 1.69  FEV1/FVC 43  FVC  % Predicted 93  FEV % Predicted 55  FeF 25-75 .71  FeF 25-75 % Predicted 3.05    Review of Systems     Objective:   Physical Exam Vitals:   11/30/15 1546  BP: 112/62  BP Location: Left Arm  Cuff Size: Normal  Pulse: 84  SpO2: (!) 87%  Weight: 167 lb (75.8 kg)  Height: 5\' 6"  (1.676 m)   Gen: Pleasant, well-nourished, in no distress,  normal affect  ENT: No lesions,  mouth clear,  oropharynx clear, no postnasal drip  Neck: No JVD, no TMG, no carotid bruits  Lungs: No use of accessory muscles, no dullness to percussion, clear without rales or rhonchi  Cardiovascular: RRR, heart sounds normal, no murmur or gallops, no peripheral edema  Musculoskeletal: No deformities, no cyanosis or clubbing  Neuro: alert, non focal  Skin: Warm, no lesions or rashes     Assessment & Plan:  COPD (chronic obstructive pulmonary disease) Please continue Anoro as you have been taking it Use albuterol 2 puffs as needed for shortness of breath Continue oxygen at 3 L/m with exertion Congratulations on stopping smoking!! Get flu shot in the fall Follow with Dr Lamonte Sakai in 6 months or sooner if you have any problems  [name], MD, PhD 11/30/2015, 4:01 PM [facility] [phone] or if no answer [phone]

## 2015-11-30 NOTE — Assessment & Plan Note (Signed)
Please continue Anoro as you have been taking it Use albuterol 2 puffs as needed for shortness of breath Continue oxygen at 3 L/m with exertion Congratulations on stopping smoking!! Get flu shot in the fall Follow with Dr Lamonte Sakai in 6 months or sooner if you have any problems

## 2015-12-21 ENCOUNTER — Ambulatory Visit (INDEPENDENT_AMBULATORY_CARE_PROVIDER_SITE_OTHER): Payer: No Typology Code available for payment source | Admitting: Licensed Clinical Social Worker

## 2015-12-21 DIAGNOSIS — F4322 Adjustment disorder with anxiety: Secondary | ICD-10-CM | POA: Diagnosis not present

## 2015-12-23 ENCOUNTER — Telehealth: Payer: Self-pay | Admitting: Emergency Medicine

## 2015-12-23 ENCOUNTER — Other Ambulatory Visit: Payer: Self-pay | Admitting: Emergency Medicine

## 2015-12-23 NOTE — Telephone Encounter (Signed)
Albuterol Solution is not covered by pt's insurance. Called [employer] at [phone] >> option 4 >> option 1. Pt's ID: [id number].

## 2015-12-24 MED ORDER — ALBUTEROL SULFATE 0.63 MG/3ML IN NEBU: 1.0000 | INHALATION_SOLUTION | Freq: Four times a day (QID) | RESPIRATORY_TRACT | 5 refills | Status: DC

## 2015-12-24 NOTE — Telephone Encounter (Signed)
[phone] pt calling back

## 2015-12-24 NOTE — Telephone Encounter (Signed)
Called [employer] at the number listed and the pt has a medicare supplement plan and must call  [phone] Called and spoke with Almyra Free and she has submitted the form for review and they will call back with determination.

## 2015-12-24 NOTE — Telephone Encounter (Signed)
Pt calling to check on the status of PA, please advise, pt says he will be out on today.Frank [name]

## 2015-12-24 NOTE — Telephone Encounter (Signed)
Attempted to contact Frank [name] at [phone], phone rang busy.  WCB

## 2015-12-24 NOTE — Telephone Encounter (Signed)
Rec'd notification from [place] at [facility] that albuterol sulfate was denied because this medication should be covered under his Medicare Part B coverage, not his Part D prescription coverage. She can be reached at [phone]. -pr

## 2015-12-24 NOTE — Telephone Encounter (Signed)
Spoke with pt, states that insurance also told him that rx for albuterol needs to be sent through [place] so Medicare part B can be ran.   rx sent to pt [facility].  Pt aware.  Nothing further needed.

## 2015-12-24 NOTE — Telephone Encounter (Signed)
Can [facility] since [facility] file it  Reather Littler  [phone]  Please call the patient also

## 2016-01-02 DIAGNOSIS — J449 Chronic obstructive pulmonary disease, unspecified: Secondary | ICD-10-CM | POA: Diagnosis not present

## 2016-01-08 ENCOUNTER — Other Ambulatory Visit: Payer: Self-pay | Admitting: Emergency Medicine

## 2016-01-12 DIAGNOSIS — H40013 Open angle with borderline findings, low risk, bilateral: Secondary | ICD-10-CM | POA: Diagnosis not present

## 2016-02-02 DIAGNOSIS — J449 Chronic obstructive pulmonary disease, unspecified: Secondary | ICD-10-CM | POA: Diagnosis not present

## 2016-02-04 DIAGNOSIS — Z23 Encounter for immunization: Secondary | ICD-10-CM | POA: Diagnosis not present

## 2016-02-09 DIAGNOSIS — J449 Chronic obstructive pulmonary disease, unspecified: Secondary | ICD-10-CM | POA: Diagnosis not present

## 2016-03-03 DIAGNOSIS — J449 Chronic obstructive pulmonary disease, unspecified: Secondary | ICD-10-CM | POA: Diagnosis not present

## 2016-03-14 DIAGNOSIS — J449 Chronic obstructive pulmonary disease, unspecified: Secondary | ICD-10-CM | POA: Diagnosis not present

## 2016-04-03 DIAGNOSIS — J449 Chronic obstructive pulmonary disease, unspecified: Secondary | ICD-10-CM | POA: Diagnosis not present

## 2016-04-28 ENCOUNTER — Encounter: Payer: Self-pay | Admitting: Internal Medicine

## 2016-05-03 DIAGNOSIS — J449 Chronic obstructive pulmonary disease, unspecified: Secondary | ICD-10-CM | POA: Diagnosis not present

## 2016-05-05 DIAGNOSIS — J449 Chronic obstructive pulmonary disease, unspecified: Secondary | ICD-10-CM | POA: Diagnosis not present

## 2016-05-29 ENCOUNTER — Ambulatory Visit (INDEPENDENT_AMBULATORY_CARE_PROVIDER_SITE_OTHER): Payer: Medicare Other | Admitting: Emergency Medicine

## 2016-05-29 ENCOUNTER — Encounter: Payer: Self-pay | Admitting: Emergency Medicine

## 2016-05-29 DIAGNOSIS — J449 Chronic obstructive pulmonary disease, unspecified: Secondary | ICD-10-CM | POA: Diagnosis not present

## 2016-05-29 DIAGNOSIS — J9611 Chronic respiratory failure with hypoxia: Secondary | ICD-10-CM | POA: Diagnosis not present

## 2016-05-29 NOTE — Assessment & Plan Note (Signed)
Please continue your Anoro daily We will perform walking oximetry on 3L/min today to see if this is adequate.  Flu shot up to date.  Continue your flonase  Follow with Dr Lamonte Sakai in 6 months or sooner if you have any problems

## 2016-05-29 NOTE — Assessment & Plan Note (Signed)
Continue albuterol 2 puffs up to every 4 hours if needed for shortness of breath.  Continue your oxygen at 3L/min at rest.

## 2016-05-29 NOTE — Patient Instructions (Addendum)
Please continue your Anoro daily Continue albuterol 2 puffs up to every 4 hours if needed for shortness of breath.  Continue your oxygen at 3L/min at rest.  We will perform walking oximetry on 3L/min today to see if this is adequate.  Flu shot up to date.  Continue your flonase  Follow with Dr Lamonte Sakai in 6 months or sooner if you have any problems

## 2016-05-29 NOTE — Progress Notes (Signed)
Subjective:    Patient ID: Frank [name], male    DOB: 1956/02/05, 61 y.o.   MRN: [id number]  HPI 61 yo smoker admitted 06/05/12 for COPD exacerbation .  PCCM w/ initial consult   06/24/12 -- [facility] follow up  Pt w/ progressive cough, dyspnea and wheezing 4 days PTA . Admitted started on aggressive pulmonary hygiene regimen w/ nebs, steroids and abx.  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  Has FH of ? Lung dz -parent deceased.   Since discharge he is feeling better Less dyspnea and cough  Started on advair and spiriva at discharge Finished steroids taper.  Walking sats in office w/out desat .  Wants to go back to work.  No hemoptysis , chest pain or edema  CXR showed COPD changes .  No previous dx of COPD , no PFT in past.    ROV 08/30/12 -- COPD, seen by me as hosp consult in 1/'14 when he was admitted for AE. Returns today after PFT >> severe AFL, hyperinflation, decreased DLCO. He is on advair + spiriva. Uses albuterol once in the am. He feels that he is getting back to baseline - actually better fxn than before he was hospitalized. He was discharged on 3L/min at all times, he has been using prn. 3 weeks ago he had nasal congestion, treated symptomatically. No wheeze, rare cough, produces clear mucous.   ROV 12/31/12 -- follow up for GOLD b COPD, managed on Spiriva + Advair. He feels much better. Has NOT restarted smoking. He used to average 1 AE a year. He is UTD on pneumovax. His functional capacity is good w walking, but some limitation with heavier work. 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 No wheeze.   ROV 05/14/13 -- follow up for GOLD b COPD, managed on Spiriva + Advair. He has been doing well, no flares since last visit. He has been compliant with his inhaled meds. He rarely has exertional limitation - only w heavy exercise. No wheeze or cough. Uses ventolin most mornings, sometimes twice a day.    ROV 11/13/13 -- follow up for GOLD c COPD, managed on Spiriva + Advair. He returns for a regular f/u. He believes that his breathing is close to the same, but he also describes needing to rest with carrying laundry, difficulty in the humidity of the shower. He wonders about whether he should try to get disability.  He no longer works, but he is active.   ROV 12/26/13 -- follows for COPD. Last time we changed Advair/ Spiriva to Anoro.  Not really sure that The Anoro has helped much. He is smoking again - about 1/2 pk a day. Uses SABA 2x a day.   ROV 03/11/14 -- hx of COPD, seen by TP 10/19 and treated with doxycycline; he had been seen earlier in the ED. Continues to smoke. Currently on Anoro. He is averaging 1 flare a year, usually in the Fall.  Due for Flu shot.   ROV 10/29/14 -- Follow-up visit for COPD, currently managed on Anoro. Uses albuterol when he doesn't recover with resting, a few times a week. He has occasional wheeze, exertional SOB. He often takes his O2 off to exert. He can sometimes exert, mow yard, etc. Without any trouble. Occasional cough.  He has cut down to 0.5pk/day. He tolerates Anoro.   ROV 05/25/15 -- follow-up visit for severe COPD. He was last seen by TP  in October 2016. She treated him for an acute exacerbation with prednisone taper, Mucinex. He continues to smoke. He is using Anoro daily.  He has been doing well since that visit. He has the same exertional tolerance, has more trouble outside especially in extreme temperatures, humidity. He is using SABA three times a day. Still does some yard work. He coughs occasionally but not every day. Blows a lot of nasal mucous. He is on mucinex. Able to walk 75 ft.   ROV 11/30/15 -- patient with a history of severe COPD, chronic rhinitis.  He stopped smoking last time, has not restarted. He has been having more dyspnea during the hot weather. Rare wheeze, rare cough. He is compliant with his O2 at 3L/min w exertion. On Anoro, tolerates well  and believes that it is helping him. He is on flonase nasal spray, helps his congestion. Occasionally uses mucinex. No exacerbations.   ROV 05/29/16 -- this is a follow-up visit for severe COPD with associated chronic hypoxemia. He has not restarted smoking. He reports that his breathing is fairly stable, is limited. He has good O2 compliance. He does have paroxysms of cough - can happen with talking, eating, non-productive. He uses albuterol about 2x a week. He remains on Anoro. He is on flonase.    CAT Score 05/25/2015 12/31/2012 08/30/2012  Total CAT Score 8 9 7     PULMONARY FUNCTON TEST 08/30/2012  FVC 3.93  FEV1 1.69  FEV1/FVC 43  FVC  % Predicted 93  FEV % Predicted 55  FeF 25-75 .71  FeF 25-75 % Predicted 3.05    Review of Systems     Objective:   Physical Exam Vitals:   05/29/16 1010  BP: 122/72  BP Location: Left Arm  Cuff Size: Normal  Pulse: (!) 106  SpO2: 90%  Weight: 169 lb 12.8 oz (77 kg)  Height: 5\' 6"  (1.676 m)   Gen: Pleasant, well-nourished, in no distress,  normal affect  ENT: No lesions,  mouth clear,  oropharynx clear, no postnasal drip  Neck: No JVD, no TMG, no carotid bruits  Lungs: No use of accessory muscles, no dullness to percussion, clear without rales or rhonchi  Cardiovascular: RRR, heart sounds normal, no murmur or gallops, no peripheral edema  Musculoskeletal: No deformities, no cyanosis or clubbing  Neuro: alert, non focal  Skin: Warm, no lesions or rashes     Assessment & Plan:  COPD (chronic obstructive pulmonary disease) Please continue your Anoro daily We will perform walking oximetry on 3L/min today to see if this is adequate.  Flu shot up to date.  Continue your flonase  Follow with Dr Lamonte Sakai in 6 months or sooner if you have any problems  Hypoxemic respiratory failure, chronic (HCC) Continue albuterol 2 puffs up to every 4 hours if needed for shortness of breath.  Continue your oxygen at 3L/min at rest.   [name],  MD, PhD 05/29/2016, 10:32 AM [facility] [phone] or if no answer [phone]

## 2016-06-03 DIAGNOSIS — J449 Chronic obstructive pulmonary disease, unspecified: Secondary | ICD-10-CM | POA: Diagnosis not present

## 2016-06-08 ENCOUNTER — Telehealth: Payer: Self-pay | Admitting: Internal Medicine

## 2016-06-08 NOTE — Telephone Encounter (Signed)
APT. REMINDER CALL, LMTCB °

## 2016-06-09 ENCOUNTER — Encounter: Payer: Self-pay | Admitting: Internal Medicine

## 2016-06-09 ENCOUNTER — Ambulatory Visit (INDEPENDENT_AMBULATORY_CARE_PROVIDER_SITE_OTHER): Payer: Medicare Other | Admitting: Internal Medicine

## 2016-06-09 VITALS — BP 130/77 | HR 80 | Temp 97.7°F | Ht 66.0 in | Wt 169.9 lb

## 2016-06-09 DIAGNOSIS — Z9981 Dependence on supplemental oxygen: Secondary | ICD-10-CM

## 2016-06-09 DIAGNOSIS — R3916 Straining to void: Secondary | ICD-10-CM | POA: Diagnosis not present

## 2016-06-09 DIAGNOSIS — Z87891 Personal history of nicotine dependence: Secondary | ICD-10-CM

## 2016-06-09 DIAGNOSIS — Z86010 Personal history of colonic polyps: Secondary | ICD-10-CM

## 2016-06-09 DIAGNOSIS — J449 Chronic obstructive pulmonary disease, unspecified: Secondary | ICD-10-CM

## 2016-06-09 DIAGNOSIS — Z1159 Encounter for screening for other viral diseases: Secondary | ICD-10-CM

## 2016-06-09 DIAGNOSIS — N401 Enlarged prostate with lower urinary tract symptoms: Secondary | ICD-10-CM | POA: Diagnosis not present

## 2016-06-09 DIAGNOSIS — K5730 Diverticulosis of large intestine without perforation or abscess without bleeding: Secondary | ICD-10-CM

## 2016-06-09 DIAGNOSIS — Z9189 Other specified personal risk factors, not elsewhere classified: Secondary | ICD-10-CM

## 2016-06-09 HISTORY — DX: Diverticulosis of large intestine without perforation or abscess without bleeding: K57.30

## 2016-06-09 NOTE — Progress Notes (Signed)
   CC: HCV screening  HPI:  [name] is a 61 y.o. male who presents today for HCV screening. Please see assessment & plan for status of chronic medical problems.   Past Medical History:  Diagnosis Date  . COPD (chronic obstructive pulmonary disease) ([place]) 06/05/2012  . Emphysema 06/07/2012  . Hypoxemia 06/07/2012  . Obstructive chronic bronchitis with exacerbation ([place]) 06/07/2012  . Tobacco abuse 06/05/2012    Review of Systems:  Please see each problem below for a pertinent review of systems.  Physical Exam:  Vitals:   06/09/16 1447  BP: 130/77  Pulse: 80  Temp: 97.7 F (36.5 C)  TempSrc: Oral  SpO2: 96%  Weight: 169 lb 14.4 oz (77.1 kg)  Height: 5\' 6"  (1.676 m)   Physical Exam  Constitutional: He is oriented to person, place, and time. No distress.  Chronically ill appearing man.  HENT:  Head: Normocephalic and atraumatic.  Wearing nasal cannula  Eyes: Conjunctivae are normal. No scleral icterus.  Cardiovascular: Normal rate and regular rhythm.   Pulmonary/Chest: Effort normal. No respiratory distress. He has no wheezes.  Poor airflow in all posterior lung fields  Neurological: He is alert and oriented to person, place, and time.  Skin: He is not diaphoretic.    Assessment & Plan:   See Encounters Tab for problem based charting.  Patient discussed with Dr. Angelia Mould

## 2016-06-09 NOTE — Patient Instructions (Addendum)
Let's see each other back in May for your wellness visit with me.   We will call you if any blood work comes back abnormal.

## 2016-06-10 LAB — CMP14 + ANION GAP
ALT: 13 IU/L (ref 0–44)
ANION GAP: 18 mmol/L (ref 10.0–18.0)
AST: 15 IU/L (ref 0–40)
Albumin/Globulin Ratio: 1.8 (ref 1.2–2.2)
Albumin: 4.3 g/dL (ref 3.6–4.8)
Alkaline Phosphatase: 79 IU/L (ref 39–117)
BUN/Creatinine Ratio: 16 (ref 10–24)
BUN: 15 mg/dL (ref 8–27)
Bilirubin Total: 0.4 mg/dL (ref 0.0–1.2)
CALCIUM: 9.7 mg/dL (ref 8.6–10.2)
CO2: 27 mmol/L (ref 18–29)
CREATININE: 0.94 mg/dL (ref 0.76–1.27)
Chloride: 95 mmol/L — ABNORMAL LOW (ref 96–106)
GFR, EST AFRICAN AMERICAN: 100 mL/min/{1.73_m2} (ref >59)
GFR, EST NON AFRICAN AMERICAN: 87 mL/min/{1.73_m2} (ref >59)
GLUCOSE: 91 mg/dL (ref 65–99)
Globulin, Total: 2.4 g/dL (ref 1.5–4.5)
Potassium: 4.2 mmol/L (ref 3.5–5.2)
Sodium: 140 mmol/L (ref 134–144)
TOTAL PROTEIN: 6.7 g/dL (ref 6.0–8.5)

## 2016-06-10 LAB — CBC
HEMOGLOBIN: 15.4 g/dL (ref 13.0–17.7)
Hematocrit: 45.4 % (ref 37.5–51.0)
MCH: 29.1 pg (ref 26.6–33.0)
MCHC: 33.9 g/dL (ref 31.5–35.7)
MCV: 86 fL (ref 79–97)
Platelets: 258 10*3/uL (ref 150–379)
RBC: 5.29 x10E6/uL (ref 4.14–5.80)
RDW: 13.3 % (ref 12.3–15.4)
WBC: 8.6 10*3/uL (ref 3.4–10.8)

## 2016-06-10 LAB — HEPATITIS C ANTIBODY: Hep C Virus Ab: <0.1 s/co ratio (ref 0.0–0.9)

## 2016-06-11 ENCOUNTER — Encounter: Payer: Self-pay | Admitting: Internal Medicine

## 2016-06-11 NOTE — Assessment & Plan Note (Signed)
Assessment He denies any symptoms of dribbling, increased urgency, increased frequency, incomplete bladder emptying. He is not on any medications.   Plan -Check CMET to assess renal function as he has not any checked since 2015.  ADDENDUM 06/11/2016  4:52 PM:  CMET without electrolyte abnormalities and stable renal function.

## 2016-06-11 NOTE — Assessment & Plan Note (Signed)
Assessment He is interested in HCV screening today. He denies prior history of IV drug use though was imprisoned in the past.  Plan -Check HCV  ADDENDUM 06/11/2016  4:52 PM:  HCV Ab negative.

## 2016-06-11 NOTE — Assessment & Plan Note (Signed)
Assessment Per review of the chart, he was to undergo follow-up colonoscopy in 3 years, for which he is 1 year overdue. He was not aware of having to return. He denies any changes in his stool, energy, abdominal pain, appetite.   His lung disease is severe given oxygen dependence though is stable from his pulmonologist's standpoint with six-month follow-ups. It would be better to elicit his values and preferences to decide if follow-up is warranted. Review of his biopsy results were without findings of dysplasia which is reassuring.  Plan -Provided him with a handout of Prepare For Your Care to initiate advance care planning within an annual wellness visit -Check CBC to ensure blood counts are at baseline  ADDENDUM 06/11/2016  4:50 PM:  CBC without any abnormalities.

## 2016-06-13 NOTE — Progress Notes (Signed)
Internal Medicine Clinic Attending  Case discussed with Dr. Patel,Rushil at the time of the visit.  We reviewed the resident's history and exam and pertinent patient test results.  I agree with the assessment, diagnosis, and plan of care documented in the resident's note.  

## 2016-06-23 ENCOUNTER — Ambulatory Visit: Payer: Self-pay | Admitting: Internal Medicine

## 2016-06-24 ENCOUNTER — Emergency Department (HOSPITAL_COMMUNITY)
Admission: EM | Admit: 2016-06-24 | Discharge: 2016-06-24 | Disposition: A | Discharge status: Home / Self Care | Payer: Medicare Other | Attending: Emergency Medicine | Admitting: Emergency Medicine

## 2016-06-24 ENCOUNTER — Emergency Department (HOSPITAL_COMMUNITY): Payer: Medicare Other

## 2016-06-24 ENCOUNTER — Encounter (HOSPITAL_COMMUNITY): Payer: Self-pay | Admitting: Emergency Medicine

## 2016-06-24 DIAGNOSIS — N132 Hydronephrosis with renal and ureteral calculous obstruction: Secondary | ICD-10-CM | POA: Diagnosis not present

## 2016-06-24 DIAGNOSIS — Z87891 Personal history of nicotine dependence: Secondary | ICD-10-CM | POA: Diagnosis not present

## 2016-06-24 DIAGNOSIS — N200 Calculus of kidney: Secondary | ICD-10-CM

## 2016-06-24 DIAGNOSIS — J449 Chronic obstructive pulmonary disease, unspecified: Secondary | ICD-10-CM | POA: Insufficient documentation

## 2016-06-24 DIAGNOSIS — R1031 Right lower quadrant pain: Secondary | ICD-10-CM | POA: Diagnosis present

## 2016-06-24 DIAGNOSIS — N201 Calculus of ureter: Secondary | ICD-10-CM

## 2016-06-24 LAB — COMPREHENSIVE METABOLIC PANEL
ALT: 16 U/L — AB (ref 17–63)
ANION GAP: 13 (ref 5–15)
AST: 21 U/L (ref 15–41)
Albumin: 4.1 g/dL (ref 3.5–5.0)
Alkaline Phosphatase: 72 U/L (ref 38–126)
BUN: 17 mg/dL (ref 6–20)
CO2: 29 mmol/L (ref 22–32)
CREATININE: 1.18 mg/dL (ref 0.61–1.24)
Calcium: 10 mg/dL (ref 8.9–10.3)
Chloride: 95 mmol/L — ABNORMAL LOW (ref 101–111)
Glucose, Bld: 105 mg/dL — ABNORMAL HIGH (ref 65–99)
POTASSIUM: 4.1 mmol/L (ref 3.5–5.1)
SODIUM: 137 mmol/L (ref 135–145)
Total Bilirubin: 1 mg/dL (ref 0.3–1.2)
Total Protein: 7.5 g/dL (ref 6.5–8.1)

## 2016-06-24 LAB — URINALYSIS, COMPLETE (UACMP) WITH MICROSCOPIC
Bilirubin Urine: NEGATIVE
GLUCOSE, UA: NEGATIVE mg/dL
KETONES UR: NEGATIVE mg/dL
Leukocytes, UA: NEGATIVE
Nitrite: NEGATIVE
PROTEIN: NEGATIVE mg/dL
Specific Gravity, Urine: 1.017 (ref 1.005–1.030)
pH: 7 (ref 5.0–8.0)

## 2016-06-24 LAB — CBC WITH DIFFERENTIAL/PLATELET
Basophils Absolute: 0.1 10*3/uL (ref 0.0–0.1)
Basophils Relative: 1 %
EOS ABS: 0.3 10*3/uL (ref 0.0–0.7)
EOS PCT: 2 %
HCT: 47.4 % (ref 39.0–52.0)
HEMOGLOBIN: 16 g/dL (ref 13.0–17.0)
LYMPHS ABS: 1.6 10*3/uL (ref 0.7–4.0)
LYMPHS PCT: 16 %
MCH: 29.3 pg (ref 26.0–34.0)
MCHC: 33.8 g/dL (ref 30.0–36.0)
MCV: 86.8 fL (ref 78.0–100.0)
Monocytes Absolute: 1.5 10*3/uL — ABNORMAL HIGH (ref 0.1–1.0)
Monocytes Relative: 14 %
NEUTROS PCT: 67 %
Neutro Abs: 6.9 10*3/uL (ref 1.7–7.7)
Platelets: 247 10*3/uL (ref 150–400)
RBC: 5.46 MIL/uL (ref 4.22–5.81)
RDW: 12.4 % (ref 11.5–15.5)
WBC: 10.3 10*3/uL (ref 4.0–10.5)

## 2016-06-24 LAB — AMYLASE: Amylase: 21 U/L — ABNORMAL LOW (ref 28–100)

## 2016-06-24 MED ORDER — MORPHINE SULFATE (PF) 4 MG/ML IV SOLN
4.0000 mg | Freq: Once | INTRAVENOUS | Status: AC
  Administered 2016-06-24: 4 mg via INTRAVENOUS
  Filled 2016-06-24: qty 1

## 2016-06-24 MED ORDER — IOPAMIDOL (ISOVUE-300) INJECTION 61%
INTRAVENOUS | Status: AC
  Administered 2016-06-24: 100 mL
  Filled 2016-06-24: qty 100

## 2016-06-24 MED ORDER — ONDANSETRON HCL 4 MG PO TABS: 4.0000 mg | ORAL_TABLET | Freq: Four times a day (QID) | ORAL | 0 refills | Status: DC | PRN

## 2016-06-24 MED ORDER — TAMSULOSIN HCL 0.4 MG PO CAPS: 0.4000 mg | ORAL_CAPSULE | Freq: Every day | ORAL | 0 refills | Status: DC

## 2016-06-24 MED ORDER — HYDROCODONE-ACETAMINOPHEN 5-325 MG PO TABS: 1.0000 | ORAL_TABLET | ORAL | 0 refills | Status: DC | PRN

## 2016-06-24 NOTE — ED Provider Notes (Signed)
[facility] DEPT Provider Note   CSN: [id number] Arrival date & time: 06/24/16  1222     History   Chief Complaint Chief Complaint  Patient presents with  . Abdominal Pain    rt side  . Back Pain    HPI JAIDAN FAGOT is a 61 y.o. male.  HPI 61 year old male who presents with intermittent right lower abdominal pain and back pain. He has a history of COPD on 2-3 L home oxygen, diverticulosis, and history of hernia repair in infancy. States onset of intermittent sharp right lower abdominal pain and right low back pain starting 3 days ago. States pain comes on primarily if he sitting in one position for too long. Pain self resolves w/o intervention. It is not associated with fevers, chills, nausea or vomiting, diarrhea, melena or hematochezia, dysuria, hematuria, or urinary frequency. Has not tried any medications for his symptoms. His had symptoms like this before.   Past Medical History:  Diagnosis Date  . COPD (chronic obstructive pulmonary disease) ([place]) 06/05/2012  . Emphysema 06/07/2012  . Hypoxemia 06/07/2012  . Obstructive chronic bronchitis with exacerbation ([place]) 06/07/2012  . Tobacco abuse 06/05/2012    Patient Active Problem List   Diagnosis Date Noted  . Encounter for HCV screening test for high risk patient 06/11/2016  . Diverticulosis of colon 06/09/2016  . Chronic respiratory failure with hypoxia, on home oxygen therapy ([place]) 05/29/2016  . Problems related to release from prison 06/26/2014  . History of colonic polyps 06/17/2014  . Benign prostatic hyperplasia (BPH) with straining on urination 06/12/2014  . COPD (chronic obstructive pulmonary disease) ([place]) 06/05/2012    Past Surgical History:  Procedure Laterality Date  . CATARACT EXTRACTION, BILATERAL         Home Medications    Prior to Admission medications   Medication Sig Start Date End Date Taking? Authorizing Provider  albuterol (ACCUNEB) 0.63 MG/3ML nebulizer solution Take 3 mLs (0.63 mg  total) by nebulization every 6 (six) hours. DX code: J44.9.  Please run through Medicare part  B. 12/24/15  Yes Collene Gobble, MD  ANORO ELLIPTA 62.5-25 MCG/INH AEPB INHALE 1 PUFF INTO THE LUNGS DAILY 01/11/16  Yes Collene Gobble, MD  fluticasone (FLONASE) 50 MCG/ACT nasal spray Place 2 sprays into both nostrils daily. 05/31/15  Yes Collene Gobble, MD  ibuprofen (ADVIL,MOTRIN) 200 MG tablet Take 400 mg by mouth every 6 (six) hours as needed (arthritis pain).   Yes Historical Provider, MD  magnesium hydroxide (MILK OF MAGNESIA) 400 MG/5ML suspension Take 30 mLs by mouth daily as needed for mild constipation.   Yes Historical Provider, MD  OXYGEN Inhale 3-4 L into the lungs continuous. 4 L when active, 3 L resting   Yes Historical Provider, MD  VENTOLIN HFA 108 (90 Base) MCG/ACT inhaler INHALE 2 PUFFS BY MOUTH EVERY 4 HOURS AS NEEDED FOR WHEEZING OR SHORTNESS OF BREATH 12/23/15  Yes Collene Gobble, MD  HYDROcodone-acetaminophen (NORCO/VICODIN) 5-325 MG tablet Take 1-2 tablets by mouth every 4 (four) hours as needed. 06/24/16   Forde Dandy, MD  ondansetron (ZOFRAN) 4 MG tablet Take 1 tablet (4 mg total) by mouth every 6 (six) hours as needed for nausea or vomiting. 06/24/16   Forde Dandy, MD  tamsulosin (FLOMAX) 0.4 MG CAPS capsule Take 1 capsule (0.4 mg total) by mouth daily. 06/24/16   Forde Dandy, MD    Family History Family History  Problem Relation Age of Onset  . Colon polyps Father   .  Heart attack Father 65  . Colon polyps Sister     Social History Social History  Substance Use Topics  . Smoking status: Former Smoker    Packs/day: 0.50    Years: 35.00    Types: Cigarettes    Quit date: 05/11/2015  . Smokeless tobacco: Former Systems developer    Types: Chew    Quit date: 05/08/1968  . Alcohol use No     Allergies   Patient has no known allergies.   Review of Systems Review of Systems 10/14 systems reviewed and are negative other than those stated in the HPI   Physical Exam Updated Vital  Signs BP 130/77   Pulse 66   Temp 98.7 F (37.1 C) (Oral)   Resp 22   Ht 5\' 6"  (1.676 m)   Wt 165 lb 5 oz (75 kg)   SpO2 98%   BMI 26.68 kg/m   Physical Exam Physical Exam  Nursing note and vitals reviewed. Constitutional: Chronically ill appearing, non-toxic, and in no acute distress Head: Normocephalic and atraumatic.  Mouth/Throat: Oropharynx is clear and moist.  Neck: Normal range of motion. Neck supple.  Cardiovascular: Normal rate and regular rhythm.   Pulmonary/Chest: Effort normal and breath sounds normal.  Abdominal: Soft. Mild distension. There is mild RLQ tenderness. There is no rebound and no guarding. No CVA tenderness. Musculoskeletal: Normal range of motion.  Neurological: Alert, no facial droop, fluent speech, moves all extremities symmetrically Skin: Skin is warm and dry.  Psychiatric: Cooperative   ED Treatments / Results  Labs (all labs ordered are listed, but only abnormal results are displayed) Labs Reviewed  AMYLASE - Abnormal; Notable for the following:       Result Value   Amylase 21 (*)    All other components within normal limits  CBC WITH DIFFERENTIAL/PLATELET - Abnormal; Notable for the following:    Monocytes Absolute 1.5 (*)    All other components within normal limits  URINALYSIS, COMPLETE (UACMP) WITH MICROSCOPIC - Abnormal; Notable for the following:    APPearance HAZY (*)    Hgb urine dipstick MODERATE (*)    Bacteria, UA RARE (*)    Squamous Epithelial / LPF 0-5 (*)    All other components within normal limits  COMPREHENSIVE METABOLIC PANEL - Abnormal; Notable for the following:    Chloride 95 (*)    Glucose, Bld 105 (*)    ALT 16 (*)    All other components within normal limits    EKG  EKG Interpretation None       Radiology Ct Abdomen Pelvis W Contrast  Result Date: 06/24/2016 CLINICAL DATA:  61 year old male with history of lower abdominal pain and back pain for the past 4 days. EXAM: CT ABDOMEN AND PELVIS WITH  CONTRAST TECHNIQUE: Multidetector CT imaging of the abdomen and pelvis was performed using the standard protocol following bolus administration of intravenous contrast. CONTRAST:  1 ISOVUE-300 IOPAMIDOL (ISOVUE-300) INJECTION 61% COMPARISON:  No priors. FINDINGS: Lower chest: Emphysematous changes throughout the visualize lung bases. Small hiatal hernia. Hepatobiliary: 1.6 cm low-attenuation lesion in segment 2 of the liver is compatible with a simple cyst. No other suspicious hepatic lesions. No intra or extrahepatic biliary ductal dilatation. Gallbladder is normal in appearance. Pancreas: No pancreatic mass. No pancreatic ductal dilatation. No pancreatic or peripancreatic fluid or inflammatory changes. Spleen: Unremarkable. Adrenals/Urinary Tract: Small nonobstructive calculi are present within the right renal collecting system, measuring up to 5 mm in the upper pole. In addition, in the proximal third  of the right ureter there is a 7 mm calculus which is associated with mild to moderate proximal right-sided hydroureteronephrosis and mild right-sided perinephric stranding. No additional calculi are identified in the left renal collecting system, or along the course of the left ureter. In the right side of the urinary bladder there is a tiny 2 mm calculus as well (image 78 of series 2). In the upper pole of the left kidney there is a 1.5 cm low-attenuation lesion, compatible with a simple cysts. Urinary bladder is normal in appearance. Bilateral adrenal glands are normal in appearance. Stomach/Bowel: The appearance either intraabdominal portion of the stomach is normal. There is no pathologic dilatation of small bowel or colon. Normal appendix. Vascular/Lymphatic: Aortic atherosclerosis, without evidence of aneurysm or dissection in the abdominal or pelvic vasculature. No lymphadenopathy noted in the abdomen or pelvis. Reproductive: Prostate gland and seminal vesicles are unremarkable in appearance. Other: No  significant volume of ascites.  No pneumoperitoneum. Musculoskeletal: There are no aggressive appearing lytic or blastic lesions noted in the visualized portions of the skeleton. IMPRESSION: 1. 7 mm calculus in the proximal third of the right ureter with mild moderate proximal right hydroureteronephrosis indicative of obstruction. 2. Multiple additional nonobstructive calculi within the right renal collecting system measuring up to 5 mm in the upper pole. 3. Aortic atherosclerosis. 4. Additional incidental findings, as above. Electronically Signed   By: Vinnie Langton M.D.   On: 06/24/2016 20:13    Procedures Procedures (including critical care time)  Medications Ordered in ED Medications  morphine 4 MG/ML injection 4 mg (not administered)  morphine 4 MG/ML injection 4 mg (4 mg Intravenous Given 06/24/16 1726)  iopamidol (ISOVUE-300) 61 % injection (100 mLs  Contrast Given 06/24/16 1948)     Initial Impression / Assessment and Plan / ED Course  I have reviewed the triage vital signs and the nursing notes.  Pertinent labs & imaging results that were available during my care of the patient were reviewed by me and considered in my medical decision making (see chart for details).     61 year old male who presents with intermittent right back and right lower abdominal pain. He is nontoxic in no acute distress with stable vital signs. Has a soft and nonsurgical abdomen. Without infection but there is some blood. Remainder of blood work is reassuring. CT abdomen and pelvis is visualized and he has evidence of a 7 mm proximal ureteral stone. His pain has been well controlled here in the ED, on my reevaluation he does not have any pain. He has a stable creatinine and no evidence of infection on the UA. I discussed that he does have a decreased likelihood of passing the stone given its size, but given that pain is well controlled without any complications he was given urology follow-up. He will call  first thing Monday for a follow-up appointment. Discussed strict return instructions. He expressed understanding of all discharge instructions, and felt comfortable to plan of care.  Final Clinical Impressions(s) / ED Diagnoses   Final diagnoses:  Ureteral stone  Kidney stone    New Prescriptions New Prescriptions   HYDROCODONE-ACETAMINOPHEN (NORCO/VICODIN) 5-325 MG TABLET    Take 1-2 tablets by mouth every 4 (four) hours as needed.   ONDANSETRON (ZOFRAN) 4 MG TABLET    Take 1 tablet (4 mg total) by mouth every 6 (six) hours as needed for nausea or vomiting.   TAMSULOSIN (FLOMAX) 0.4 MG CAPS CAPSULE    Take 1 capsule (0.4 mg total) by  mouth daily.     Forde Dandy, MD 06/24/16 [phone]

## 2016-06-24 NOTE — Discharge Instructions (Signed)
You have a 7 mm kidney stone. You have a lower chance of passing this so you need to call the urologist listed to set up appointment on Monday.  Return without fail for worsening symptoms, including worsening pain, fever, rectal vomiting, or any other symptoms concerning to you.

## 2016-06-24 NOTE — ED Notes (Signed)
Light green and purple top sent to lab initially.  Attempted to call lab with no answer to add on CMP.  Will click off and follow up

## 2016-06-24 NOTE — ED Triage Notes (Signed)
Asking about the wait itme

## 2016-06-24 NOTE — ED Triage Notes (Signed)
Pt. Stated, I started having rt side pain and sometimes I goes to my back. This started 3 days ago.

## 2016-06-24 NOTE — ED Notes (Signed)
Patient transported to CT 

## 2016-06-26 DIAGNOSIS — N202 Calculus of kidney with calculus of ureter: Secondary | ICD-10-CM | POA: Diagnosis not present

## 2016-06-27 ENCOUNTER — Other Ambulatory Visit: Payer: Self-pay | Admitting: Urology

## 2016-06-28 ENCOUNTER — Encounter (HOSPITAL_COMMUNITY): Payer: Self-pay | Admitting: *Deleted

## 2016-06-29 ENCOUNTER — Encounter (HOSPITAL_COMMUNITY)
Admission: RE | Disposition: A | Discharge status: Home / Self Care | Payer: Self-pay | Source: Ambulatory Visit | Attending: Urology

## 2016-06-29 ENCOUNTER — Ambulatory Visit (HOSPITAL_COMMUNITY): Payer: Medicare Other

## 2016-06-29 ENCOUNTER — Ambulatory Visit (HOSPITAL_COMMUNITY)
Admission: RE | Admit: 2016-06-29 | Discharge: 2016-06-29 | Disposition: A | Discharge status: Home / Self Care | Payer: Medicare Other | Source: Ambulatory Visit | Attending: Urology | Admitting: Urology

## 2016-06-29 ENCOUNTER — Encounter (HOSPITAL_COMMUNITY): Payer: Self-pay | Admitting: *Deleted

## 2016-06-29 DIAGNOSIS — Z7951 Long term (current) use of inhaled steroids: Secondary | ICD-10-CM | POA: Diagnosis not present

## 2016-06-29 DIAGNOSIS — N201 Calculus of ureter: Secondary | ICD-10-CM | POA: Diagnosis not present

## 2016-06-29 DIAGNOSIS — Z841 Family history of disorders of kidney and ureter: Secondary | ICD-10-CM | POA: Diagnosis not present

## 2016-06-29 DIAGNOSIS — Z87891 Personal history of nicotine dependence: Secondary | ICD-10-CM | POA: Insufficient documentation

## 2016-06-29 DIAGNOSIS — Z79899 Other long term (current) drug therapy: Secondary | ICD-10-CM | POA: Diagnosis not present

## 2016-06-29 DIAGNOSIS — Z791 Long term (current) use of non-steroidal anti-inflammatories (NSAID): Secondary | ICD-10-CM | POA: Insufficient documentation

## 2016-06-29 DIAGNOSIS — J449 Chronic obstructive pulmonary disease, unspecified: Secondary | ICD-10-CM | POA: Insufficient documentation

## 2016-06-29 HISTORY — PX: EXTRACORPOREAL SHOCK WAVE LITHOTRIPSY: SHX1557

## 2016-06-29 SURGERY — LITHOTRIPSY, ESWL
Anesthesia: LOCAL | Laterality: Right

## 2016-06-29 MED ORDER — OXYCODONE HCL 5 MG PO TABS
5.0000 mg | ORAL_TABLET | ORAL | Status: DC | PRN
  Administered 2016-06-29: 5 mg via ORAL
  Filled 2016-06-29: qty 1

## 2016-06-29 MED ORDER — DIPHENHYDRAMINE HCL 25 MG PO CAPS
25.0000 mg | ORAL_CAPSULE | ORAL | Status: AC
  Administered 2016-06-29: 25 mg via ORAL
  Filled 2016-06-29: qty 1

## 2016-06-29 MED ORDER — DIAZEPAM 5 MG PO TABS
10.0000 mg | ORAL_TABLET | ORAL | Status: AC
  Administered 2016-06-29: 10 mg via ORAL
  Filled 2016-06-29: qty 2

## 2016-06-29 MED ORDER — CEFAZOLIN SODIUM-DEXTROSE 2-4 GM/100ML-% IV SOLN
2.0000 g | INTRAVENOUS | Status: AC
  Administered 2016-06-29: 2 g via INTRAVENOUS
  Filled 2016-06-29: qty 100

## 2016-06-29 MED ORDER — SODIUM CHLORIDE 0.9 % IV SOLN
INTRAVENOUS | Status: DC
  Administered 2016-06-29: 10:00:00 via INTRAVENOUS

## 2016-06-29 NOTE — H&P (Signed)
1 - Right Ureteral STone - 70m Rt mid ureteral stone by ER CT 06/2016 on eval flank pain. Stone is 735m mid ureter, 900 HU, 11cm SSD and adjacent to L5-S1 on scout images. Two small ipsilateral renal stones as well. Stone is visible by KUB.   PMH sig for COPD (O2 at al times), no CV disease. His PCP is with [facility].   Today "Frank [name] seen in consultation for above. He is referred by DaBrantley StageD [facility].     ALLERGIES: No Known Allergies    MEDICATIONS: Tamsulosin Hcl 0.4 mg capsule, ext release 24 hr  Albuterol Sulfate  Flonase Allergy Relief  Hydrocodone-Acetaminophen 5 mg-325 mg tablet  Ibuprofen 200 mg capsule  Milk Of Magnesia  Oxygen  Ventolin Hfa  Vilanterol  Zofran     GU PSH: None   NON-GU PSH: Cataract surgery - 2014    GU PMH: None   NON-GU PMH: COPD    FAMILY HISTORY: Death - Mother, Father Kidney Failure - Father nephrolithiasis - Mother   SOCIAL HISTORY: Marital Status: Single Current Smoking Status: Patient does not smoke anymore. Smoked for 40 years.   Tobacco Use Assessment Completed: Used Tobacco in last 30 days? Does not drink anymore.  Drinks 3 caffeinated drinks per day. Patient's occupation is/was Disabled.    REVIEW OF SYSTEMS:    GU Review Male:   Patient reports stream starts and stops and trouble starting your stream. Patient denies frequent urination, hard to postpone urination, burning/ pain with urination, get up at night to urinate, leakage of urine, have to strain to urinate , erection problems, and penile pain.  Gastrointestinal (Upper):   Patient denies nausea, vomiting, and indigestion/ heartburn.  Gastrointestinal (Lower):   Patient denies diarrhea and constipation.  Constitutional:   Patient denies fever, night sweats, weight loss, and fatigue.  Skin:   Patient denies skin rash/ lesion and itching.  Eyes:   Patient denies blurred vision and double vision.  Ears/ Nose/ Throat:   Patient denies sore throat and sinus  problems.  Hematologic/Lymphatic:   Patient denies swollen glands and easy bruising.  Cardiovascular:   Patient denies leg swelling and chest pains.  Respiratory:   Patient reports cough and shortness of breath.   Endocrine:   Patient denies excessive thirst.  Musculoskeletal:   Patient reports back pain and joint pain.   Neurological:   Patient denies headaches and dizziness.  Psychologic:   Patient denies depression and anxiety.   VITAL SIGNS:      06/26/2016 12:00 PM  Weight 165 lb / 74.84 kg  Height 66 in / 167.64 cm  BP 149/89 mmHg  Pulse 101 /min  Temperature 98.2 F / 37 C  BMI 26.6 kg/m   GU PHYSICAL EXAMINATION:    Scrotum: No lesions. No edema. No cysts. No warts.  Epididymides: Right: no spermatocele, no masses, no cysts, no tenderness, no induration, no enlargement. Left: no spermatocele, no masses, no cysts, no tenderness, no induration, no enlargement.  Testes: No tenderness, no swelling, no enlargement left testes. No tenderness, no swelling, no enlargement right testes. Normal location left testes. Normal location right testes. No mass, no cyst, no varicocele, no hydrocele left testes. No mass, no cyst, no varicocele, no hydrocele right testes.  Urethral Meatus: Normal size. No lesion, no wart, no discharge, no polyp. Normal location.  Penis: Circumcised, no warts, no cracks. No dorsal Peyronie's plaques, no left corporal Peyronie's plaques, no right corporal Peyronie's plaques, no scarring,  no warts. No balanitis, no meatal stenosis.  Seminal Vesicles: Nonpalpable.  Sphincter Tone: Normal sphincter. No rectal tenderness. No rectal mass.    MULTI-SYSTEM PHYSICAL EXAMINATION:    Constitutional: Well-nourished. No physical deformities. Normally developed. Good grooming. Appears OLDER than stated age.   Neck: Neck symmetrical, not swollen. Normal tracheal position.  Respiratory: No labored breathing, no use of accessory muscles.   Cardiovascular: Normal temperature, normal  extremity pulses, no swelling, no varicosities.  Lymphatic: No enlargement of neck, axillae, groin.  Skin: No paleness, no jaundice, no cyanosis. No lesion, no ulcer, no rash.  Neurologic / Psychiatric: Oriented to time, oriented to place, oriented to person. No depression, no anxiety, no agitation.  Gastrointestinal: No mass, no tenderness, no rigidity, non obese abdomen.  Eyes: Normal conjunctivae. Normal eyelids.  Ears, Nose, Mouth, and Throat: Left ear no scars, no lesions, no masses. Right ear no scars, no lesions, no masses. Nose no scars, no lesions, no masses. Normal hearing. Normal lips.  Musculoskeletal: Normal gait and station of head and neck. Mild Rt CVAT.      PAST DATA REVIEWED:  Source Of History:  Patient  Lab Test Review:   BMP  Records Review:   Previous Doctor Records, Previous Hospital Records  Urine Test Review:   Urinalysis  X-Ray Review: [facility] Protocol: Reviewed Films. Reviewed Report. Discussed With Patient.     PROCEDURES:         KUB - [id number]  A single view of the abdomen is obtained.              Notes:   persistant contrast in bladder. Rt mid ureteral stone is noted at L5-S1 area as expected based on recent CT.    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 He has favorable parameters for success with this. RIsks, benefits, peri-treatment course discussed   Pain meds refilled today.

## 2016-06-29 NOTE — Discharge Instructions (Signed)
See [facility] discharge instructions in chart. ° ° ° °Moderate Conscious Sedation, Adult, Care After °These instructions provide you with information about caring for yourself after your procedure. Your health care provider may also give you more specific instructions. Your treatment has been planned according to current medical practices, but problems sometimes occur. Call your health care provider if you have any problems or questions after your procedure. °What can I expect after the procedure? °After your procedure, it is common: °· To feel sleepy for several hours. °· To feel clumsy and have poor balance for several hours. °· To have poor judgment for several hours. °· To vomit if you eat too soon. °Follow these instructions at home: °For at least 24 hours after the procedure:  ° °· Do not: °¨ Participate in activities where you could fall or become injured. °¨ Drive. °¨ Use heavy machinery. °¨ Drink alcohol. °¨ Take sleeping pills or medicines that cause drowsiness. °¨ Make important decisions or sign legal documents. °¨ Take care of children on your own. °· Rest. °Eating and drinking  °· Follow the diet recommended by your health care provider. °· If you vomit: °¨ Drink water, juice, or soup when you can drink without vomiting. °¨ Make sure you have little or no nausea before eating solid foods. °General instructions  °· Have a responsible adult stay with you until you are awake and alert. °· Take over-the-counter and prescription medicines only as told by your health care provider. °· If you smoke, do not smoke without supervision. °· Keep all follow-up visits as told by your health care provider. This is important. °Contact a health care provider if: °· You keep feeling nauseous or you keep vomiting. °· You feel light-headed. °· You develop a rash. °· You have a fever. °Get help right away if: °· You have trouble breathing. °This information is not intended to replace advice given to you by your  health care provider. Make sure you discuss any questions you have with your health care provider. °Document Released: 02/12/2013 Document Revised: 09/27/2015 Document Reviewed: 08/14/2015 °Elsevier Interactive Patient Education © 2017 Elsevier Inc. ° ° °

## 2016-06-29 NOTE — Op Note (Signed)
See [facility] note scanned into chart. Also because of the size, density, location and other factors that cannot be anticipated I feel this will likely be a staged procedure. This fact supersedes any indication in the scanned [facility] operative note to the contrary.  

## 2016-07-04 DIAGNOSIS — J449 Chronic obstructive pulmonary disease, unspecified: Secondary | ICD-10-CM | POA: Diagnosis not present

## 2016-07-07 ENCOUNTER — Telehealth: Payer: Self-pay

## 2016-07-07 NOTE — Telephone Encounter (Signed)
Question about lab result. Please call back.

## 2016-07-07 NOTE — Telephone Encounter (Signed)
Called pt explained hepc result

## 2016-07-12 ENCOUNTER — Other Ambulatory Visit: Payer: Self-pay | Admitting: Emergency Medicine

## 2016-07-13 DIAGNOSIS — J449 Chronic obstructive pulmonary disease, unspecified: Secondary | ICD-10-CM | POA: Diagnosis not present

## 2016-07-13 DIAGNOSIS — N202 Calculus of kidney with calculus of ureter: Secondary | ICD-10-CM | POA: Diagnosis not present

## 2016-07-24 DIAGNOSIS — H40013 Open angle with borderline findings, low risk, bilateral: Secondary | ICD-10-CM | POA: Diagnosis not present

## 2016-07-24 DIAGNOSIS — H35362 Drusen (degenerative) of macula, left eye: Secondary | ICD-10-CM | POA: Diagnosis not present

## 2016-07-24 DIAGNOSIS — Z961 Presence of intraocular lens: Secondary | ICD-10-CM | POA: Diagnosis not present

## 2016-07-24 DIAGNOSIS — H26491 Other secondary cataract, right eye: Secondary | ICD-10-CM | POA: Diagnosis not present

## 2016-08-01 ENCOUNTER — Encounter (HOSPITAL_COMMUNITY): Payer: Self-pay | Admitting: Urology

## 2016-08-01 DIAGNOSIS — J449 Chronic obstructive pulmonary disease, unspecified: Secondary | ICD-10-CM | POA: Diagnosis not present

## 2016-08-15 ENCOUNTER — Other Ambulatory Visit: Payer: Self-pay | Admitting: Emergency Medicine

## 2016-08-17 DIAGNOSIS — N202 Calculus of kidney with calculus of ureter: Secondary | ICD-10-CM | POA: Diagnosis not present

## 2016-09-01 DIAGNOSIS — J449 Chronic obstructive pulmonary disease, unspecified: Secondary | ICD-10-CM | POA: Diagnosis not present

## 2016-09-08 DIAGNOSIS — J449 Chronic obstructive pulmonary disease, unspecified: Secondary | ICD-10-CM | POA: Diagnosis not present

## 2016-09-13 DIAGNOSIS — N202 Calculus of kidney with calculus of ureter: Secondary | ICD-10-CM | POA: Diagnosis not present

## 2016-09-20 ENCOUNTER — Other Ambulatory Visit: Payer: Self-pay | Admitting: Urology

## 2016-09-21 ENCOUNTER — Encounter (HOSPITAL_COMMUNITY): Payer: Self-pay

## 2016-09-21 NOTE — Patient Instructions (Addendum)
JAYAN RAYMUNDO  09/21/2016   Your procedure is scheduled on: 09/24/16  Report to [facility] Main  Entrance             Take [place]  elevators to 3rd floor to  [place] at     1145 AM.   Call this number if you have problems the morning of surgery [phone]    Remember: ONLY 1 PERSON MAY GO WITH YOU TO SHORT STAY TO GET  READY MORNING OF [place].  Do not eat food or drink liquids :After Midnight.     Take these medicines the morning of surgery with A SIP OF WATER: inhalers and bring, flonase if needed,                                You may not have any metal on your body including hair pins and              piercings  Do not wear jewelry,, lotions, powders or perfumes, deodorant            .              Men may shave face and neck.   Do not bring valuables to the hospital. [place].  Contacts, dentures or bridgework may not be worn into surgery.     Patients discharged the day of surgery will not be allowed to drive home.  Name and phone number of your driver:  Special Instructions: N/A              Please read over the following fact sheets you were given: _____________________________________________________________________            [facility] - Preparing for Surgery Before surgery, you can play an important role.  Because skin is not sterile, your skin needs to be as free of germs as possible.  You can reduce the number of germs on your skin by washing with CHG (chlorahexidine gluconate) soap before surgery.  CHG is an antiseptic cleaner which kills germs and bonds with the skin to continue killing germs even after washing. Please DO NOT use if you have an allergy to CHG or antibacterial soaps.  If your skin becomes reddened/irritated stop using the CHG and inform your nurse when you arrive at Short Stay. Do not shave (including legs and underarms) for at least 48 hours prior to the  first CHG shower.  You may shave your face/neck. Please follow these instructions carefully:  1.  Shower with CHG Soap the night before surgery and the  morning of Surgery.  2.  If you choose to wash your hair, wash your hair first as usual with your  normal  shampoo.  3.  After you shampoo, rinse your hair and body thoroughly to remove the  shampoo.                           4.  Use CHG as you would any other liquid soap.  You can apply chg directly  to the skin and wash  Gently with a scrungie or clean washcloth.  5.  Apply the CHG Soap to your body ONLY FROM THE NECK DOWN.   Do not use on face/ open                           Wound or open sores. Avoid contact with eyes, ears mouth and genitals (private parts).                       Wash face,  Genitals (private parts) with your normal soap.             6.  Wash thoroughly, paying special attention to the area where your surgery  will be performed.  7.  Thoroughly rinse your body with warm water from the neck down.  8.  DO NOT shower/wash with your normal soap after using and rinsing off  the CHG Soap.                9.  Pat yourself dry with a clean towel.            10.  Wear clean pajamas.            11.  Place clean sheets on your bed the night of your first shower and do not  sleep with pets. Day of Surgery : Do not apply any lotions/deodorants the morning of surgery.  Please wear clean clothes to the hospital/surgery center.  FAILURE TO FOLLOW THESE INSTRUCTIONS MAY RESULT IN THE CANCELLATION OF YOUR SURGERY PATIENT SIGNATURE_________________________________  NURSE SIGNATURE__________________________________  ________________________________________________________________________  WHAT IS A BLOOD TRANSFUSION? Blood Transfusion Information  A transfusion is the replacement of blood or some of its parts. Blood is made up of multiple cells which provide different functions.  Red blood cells carry oxygen and are  used for blood loss replacement.  White blood cells fight against infection.  Platelets control bleeding.  Plasma helps clot blood.  Other blood products are available for specialized needs, such as hemophilia or other clotting disorders. BEFORE THE TRANSFUSION  Who gives blood for transfusions?   Healthy volunteers who are fully evaluated to make sure their blood is safe. This is blood bank blood. Transfusion therapy is the safest it has ever been in the practice of medicine. Before blood is taken from a donor, a complete history is taken to make sure that person has no history of diseases nor engages in risky social behavior (examples are intravenous drug use or sexual activity with multiple partners). The donor's travel history is screened to minimize risk of transmitting infections, such as malaria. The donated blood is tested for signs of infectious diseases, such as HIV and hepatitis. The blood is then tested to be sure it is compatible with you in order to minimize the chance of a transfusion reaction. If you or a relative donates blood, this is often done in anticipation of surgery and is not appropriate for emergency situations. It takes many days to process the donated blood. RISKS AND COMPLICATIONS Although transfusion therapy is very safe and saves many lives, the main dangers of transfusion include:   Getting an infectious disease.  Developing a transfusion reaction. This is an allergic reaction to something in the blood you were given. Every precaution is taken to prevent this. The decision to have a blood transfusion has been considered carefully by your caregiver before blood is given. Blood is not given unless the benefits outweigh  the risks. AFTER THE TRANSFUSION  Right after receiving a blood transfusion, you will usually feel much better and more energetic. This is especially true if your red blood cells have gotten low (anemic). The transfusion raises the level of the red  blood cells which carry oxygen, and this usually causes an energy increase.  The nurse administering the transfusion will monitor you carefully for complications. HOME CARE INSTRUCTIONS  No special instructions are needed after a transfusion. You may find your energy is better. Speak with your caregiver about any limitations on activity for underlying diseases you may have. SEEK MEDICAL CARE IF:   Your condition is not improving after your transfusion.  You develop redness or irritation at the intravenous (IV) site. SEEK IMMEDIATE MEDICAL CARE IF:  Any of the following symptoms occur over the next 12 hours:  Shaking chills.  You have a temperature by mouth above 102 F (38.9 C), not controlled by medicine.  Chest, back, or muscle pain.  People around you feel you are not acting correctly or are confused.  Shortness of breath or difficulty breathing.  Dizziness and fainting.  You get a rash or develop hives.  You have a decrease in urine output.  Your urine turns a dark color or changes to pink, red, or [place]. Any of the following symptoms occur over the next 10 days:  You have a temperature by mouth above 102 F (38.9 C), not controlled by medicine.  Shortness of breath.  Weakness after normal activity.  The white part of the eye turns yellow (jaundice).  You have a decrease in the amount of urine or are urinating less often.  Your urine turns a dark color or changes to pink, red, or [place]. Document Released: 04/21/2000 Document Revised: 07/17/2011 Document Reviewed: 12/09/2007 ExitCare Patient Information 2014 ExitCare, [place].  _______________________________________________________________________   CLEAR LIQUID DIET   Foods Allowed                                                                     Foods Excluded  Coffee and tea, regular and decaf                             liquids that you cannot  Plain Jell-O in any flavor                                              see through such as: Fruit ices (not with fruit pulp)                                     milk, soups, orange juice  Iced Popsicles                                    All solid food Carbonated beverages, regular and diet  Cranberry, grape and apple juices Sports drinks like Gatorade Lightly seasoned clear broth or consume(fat free) Sugar, honey syrup  Sample Menu Breakfast                                Lunch                                     Supper Cranberry juice                    Beef broth                            Chicken broth Jell-O                                     Grape juice                           Apple juice Coffee or tea                        Jell-O                                      Popsicle                                                Coffee or tea                        Coffee or tea  _____________________________________________________________________

## 2016-09-21 NOTE — Progress Notes (Signed)
Called and left message for Selita to inform Dr. Tresa Moore that orders need second sign in EPIC!

## 2016-09-25 ENCOUNTER — Encounter (HOSPITAL_COMMUNITY)
Admission: RE | Admit: 2016-09-25 | Discharge: 2016-09-25 | Disposition: A | Discharge status: Home / Self Care | Payer: Medicare Other | Source: Ambulatory Visit | Attending: Urology | Admitting: Urology

## 2016-09-25 ENCOUNTER — Encounter (HOSPITAL_COMMUNITY): Payer: Self-pay

## 2016-09-25 DIAGNOSIS — N132 Hydronephrosis with renal and ureteral calculous obstruction: Secondary | ICD-10-CM | POA: Diagnosis not present

## 2016-09-25 DIAGNOSIS — Z01818 Encounter for other preprocedural examination: Secondary | ICD-10-CM | POA: Insufficient documentation

## 2016-09-25 HISTORY — DX: Personal history of urinary calculi: Z87.442

## 2016-09-25 HISTORY — DX: Dyspnea, unspecified: R06.00

## 2016-09-25 HISTORY — DX: Pneumonia, unspecified organism: J18.9

## 2016-09-25 HISTORY — DX: Cardiac murmur, unspecified: R01.1

## 2016-09-25 HISTORY — DX: Cardiac arrhythmia, unspecified: I49.9

## 2016-09-25 LAB — BASIC METABOLIC PANEL
Anion gap: 8 (ref 5–15)
BUN: 18 mg/dL (ref 6–20)
CO2: 32 mmol/L (ref 22–32)
Calcium: 9.9 mg/dL (ref 8.9–10.3)
Chloride: 100 mmol/L — ABNORMAL LOW (ref 101–111)
Creatinine, Ser: 1.05 mg/dL (ref 0.61–1.24)
GFR calc Af Amer: >60 mL/min (ref >60)
GFR calc non Af Amer: >60 mL/min (ref >60)
Glucose, Bld: 106 mg/dL — ABNORMAL HIGH (ref 65–99)
Potassium: 4.7 mmol/L (ref 3.5–5.1)
SODIUM: 140 mmol/L (ref 135–145)

## 2016-09-25 LAB — CBC
HEMATOCRIT: 43.6 % (ref 39.0–52.0)
Hemoglobin: 14.6 g/dL (ref 13.0–17.0)
MCH: 28.9 pg (ref 26.0–34.0)
MCHC: 33.5 g/dL (ref 30.0–36.0)
MCV: 86.3 fL (ref 78.0–100.0)
Platelets: 228 10*3/uL (ref 150–400)
RBC: 5.05 MIL/uL (ref 4.22–5.81)
RDW: 12.9 % (ref 11.5–15.5)
WBC: 7.4 10*3/uL (ref 4.0–10.5)

## 2016-09-25 NOTE — Progress Notes (Signed)
06/24/16 CCT abd and pelvis epic

## 2016-10-01 DIAGNOSIS — J449 Chronic obstructive pulmonary disease, unspecified: Secondary | ICD-10-CM | POA: Diagnosis not present

## 2016-10-03 NOTE — Progress Notes (Signed)
Called and spoke with patient's sister, [name] and informed her of time change for surgery at [facility] 10/04/2016 . Instructed her to have him arrive at 1215 pm and report to third floor Short Stay and may have clear liquids from midnight up until 0815 am in the morning but nothing after 0815 am. His sister, [name] verbalized understanding.

## 2016-10-04 ENCOUNTER — Ambulatory Visit (HOSPITAL_COMMUNITY)
Admission: RE | Admit: 2016-10-04 | Discharge: 2016-10-04 | Disposition: A | Discharge status: Home / Self Care | Payer: Medicare Other | Source: Ambulatory Visit | Attending: Urology | Admitting: Urology

## 2016-10-04 ENCOUNTER — Encounter (HOSPITAL_COMMUNITY)
Admission: RE | Disposition: A | Discharge status: Home / Self Care | Payer: Self-pay | Source: Ambulatory Visit | Attending: Urology

## 2016-10-04 ENCOUNTER — Ambulatory Visit (HOSPITAL_COMMUNITY): Payer: Medicare Other | Admitting: Certified Registered Nurse Anesthetist

## 2016-10-04 ENCOUNTER — Ambulatory Visit (HOSPITAL_COMMUNITY): Payer: Medicare Other

## 2016-10-04 ENCOUNTER — Encounter (HOSPITAL_COMMUNITY): Payer: Self-pay | Admitting: *Deleted

## 2016-10-04 DIAGNOSIS — J439 Emphysema, unspecified: Secondary | ICD-10-CM | POA: Diagnosis not present

## 2016-10-04 DIAGNOSIS — N201 Calculus of ureter: Secondary | ICD-10-CM | POA: Diagnosis not present

## 2016-10-04 DIAGNOSIS — N132 Hydronephrosis with renal and ureteral calculous obstruction: Secondary | ICD-10-CM | POA: Diagnosis not present

## 2016-10-04 DIAGNOSIS — Z79899 Other long term (current) drug therapy: Secondary | ICD-10-CM | POA: Diagnosis not present

## 2016-10-04 DIAGNOSIS — N401 Enlarged prostate with lower urinary tract symptoms: Secondary | ICD-10-CM | POA: Diagnosis not present

## 2016-10-04 DIAGNOSIS — Z87891 Personal history of nicotine dependence: Secondary | ICD-10-CM | POA: Insufficient documentation

## 2016-10-04 DIAGNOSIS — Z87442 Personal history of urinary calculi: Secondary | ICD-10-CM | POA: Diagnosis not present

## 2016-10-04 DIAGNOSIS — R3916 Straining to void: Secondary | ICD-10-CM | POA: Diagnosis not present

## 2016-10-04 DIAGNOSIS — J449 Chronic obstructive pulmonary disease, unspecified: Secondary | ICD-10-CM | POA: Diagnosis not present

## 2016-10-04 HISTORY — PX: CYSTOSCOPY/RETROGRADE/URETEROSCOPY/STONE EXTRACTION WITH BASKET: SHX5317

## 2016-10-04 SURGERY — CYSTOSCOPY, WITH CALCULUS REMOVAL USING BASKET
Anesthesia: General | Laterality: Right

## 2016-10-04 MED ORDER — CEFAZOLIN SODIUM-DEXTROSE 2-4 GM/100ML-% IV SOLN
2.0000 g | INTRAVENOUS | Status: DC
  Filled 2016-10-04: qty 100

## 2016-10-04 MED ORDER — SODIUM CHLORIDE 0.9 % IR SOLN
Status: DC | PRN
  Administered 2016-10-04: 1000 mL

## 2016-10-04 MED ORDER — FENTANYL CITRATE (PF) 100 MCG/2ML IJ SOLN
INTRAMUSCULAR | Status: AC
  Filled 2016-10-04: qty 2

## 2016-10-04 MED ORDER — PHENYLEPHRINE 40 MCG/ML (10ML) SYRINGE FOR IV PUSH (FOR BLOOD PRESSURE SUPPORT)
PREFILLED_SYRINGE | INTRAVENOUS | Status: DC | PRN
  Administered 2016-10-04 (×3): 80 ug via INTRAVENOUS

## 2016-10-04 MED ORDER — TRAMADOL HCL 50 MG PO TABS: 50.0000 mg | ORAL_TABLET | Freq: Four times a day (QID) | ORAL | 0 refills | Status: DC | PRN

## 2016-10-04 MED ORDER — KETOROLAC TROMETHAMINE 30 MG/ML IJ SOLN: 30.0000 mg | Freq: Once | INTRAMUSCULAR | Status: DC | PRN

## 2016-10-04 MED ORDER — MEPERIDINE HCL 50 MG/ML IJ SOLN: 6.2500 mg | INTRAMUSCULAR | Status: DC | PRN

## 2016-10-04 MED ORDER — LIDOCAINE 2% (20 MG/ML) 5 ML SYRINGE
INTRAMUSCULAR | Status: AC
  Filled 2016-10-04: qty 5

## 2016-10-04 MED ORDER — PROMETHAZINE HCL 25 MG/ML IJ SOLN: 6.2500 mg | INTRAMUSCULAR | Status: DC | PRN

## 2016-10-04 MED ORDER — CEFAZOLIN SODIUM-DEXTROSE 2-4 GM/100ML-% IV SOLN
2.0000 g | Freq: Once | INTRAVENOUS | Status: AC
  Administered 2016-10-04: 2 g via INTRAVENOUS
  Filled 2016-10-04: qty 100

## 2016-10-04 MED ORDER — CEPHALEXIN 500 MG PO CAPS: 500.0000 mg | ORAL_CAPSULE | Freq: Two times a day (BID) | ORAL | 0 refills | Status: DC

## 2016-10-04 MED ORDER — KETOROLAC TROMETHAMINE 10 MG PO TABS: 10.0000 mg | ORAL_TABLET | Freq: Four times a day (QID) | ORAL | 1 refills | Status: DC | PRN

## 2016-10-04 MED ORDER — PROPOFOL 10 MG/ML IV BOLUS
INTRAVENOUS | Status: DC | PRN
  Administered 2016-10-04: 100 mg via INTRAVENOUS
  Administered 2016-10-04: 20 mg via INTRAVENOUS

## 2016-10-04 MED ORDER — PHENYLEPHRINE 40 MCG/ML (10ML) SYRINGE FOR IV PUSH (FOR BLOOD PRESSURE SUPPORT)
PREFILLED_SYRINGE | INTRAVENOUS | Status: AC
  Filled 2016-10-04: qty 10

## 2016-10-04 MED ORDER — EPHEDRINE 5 MG/ML INJ
INTRAVENOUS | Status: AC
  Filled 2016-10-04: qty 10

## 2016-10-04 MED ORDER — MIDAZOLAM HCL 2 MG/2ML IJ SOLN
INTRAMUSCULAR | Status: AC
Start: 2016-10-04 — End: 2016-10-04
  Filled 2016-10-04: qty 2

## 2016-10-04 MED ORDER — LACTATED RINGERS IV SOLN
INTRAVENOUS | Status: DC
  Administered 2016-10-04 (×3): via INTRAVENOUS

## 2016-10-04 MED ORDER — IOHEXOL 300 MG/ML  SOLN
INTRAMUSCULAR | Status: DC | PRN
  Administered 2016-10-04: 23 mL

## 2016-10-04 MED ORDER — EPHEDRINE 5 MG/ML INJ
INTRAVENOUS | Status: AC
Start: 2016-10-04 — End: 2016-10-04
  Filled 2016-10-04: qty 10

## 2016-10-04 MED ORDER — SODIUM CHLORIDE 0.9 % IR SOLN
Status: DC | PRN
  Administered 2016-10-04: 3000 mL

## 2016-10-04 MED ORDER — ONDANSETRON HCL 4 MG/2ML IJ SOLN
INTRAMUSCULAR | Status: DC | PRN
  Administered 2016-10-04: 4 mg via INTRAVENOUS

## 2016-10-04 MED ORDER — SENNOSIDES-DOCUSATE SODIUM 8.6-50 MG PO TABS: 1.0000 | ORAL_TABLET | Freq: Two times a day (BID) | ORAL | 0 refills | Status: DC

## 2016-10-04 MED ORDER — LIDOCAINE 2% (20 MG/ML) 5 ML SYRINGE
INTRAMUSCULAR | Status: DC | PRN
  Administered 2016-10-04: 40 mg via INTRAVENOUS
  Administered 2016-10-04: 60 mg via INTRAVENOUS

## 2016-10-04 MED ORDER — FENTANYL CITRATE (PF) 100 MCG/2ML IJ SOLN: 25.0000 ug | INTRAMUSCULAR | Status: DC | PRN

## 2016-10-04 MED ORDER — PROPOFOL 10 MG/ML IV BOLUS
INTRAVENOUS | Status: AC
  Filled 2016-10-04: qty 20

## 2016-10-04 MED ORDER — ONDANSETRON HCL 4 MG/2ML IJ SOLN
INTRAMUSCULAR | Status: AC
  Filled 2016-10-04: qty 2

## 2016-10-04 MED ORDER — EPHEDRINE SULFATE-NACL 50-0.9 MG/10ML-% IV SOSY
PREFILLED_SYRINGE | INTRAVENOUS | Status: DC | PRN
  Administered 2016-10-04 (×2): 5 mg via INTRAVENOUS

## 2016-10-04 MED ORDER — FENTANYL CITRATE (PF) 100 MCG/2ML IJ SOLN
INTRAMUSCULAR | Status: DC | PRN
  Administered 2016-10-04 (×4): 25 ug via INTRAVENOUS

## 2016-10-04 SURGICAL SUPPLY — 25 items
BAG URO CATCHER STRL LF (MISCELLANEOUS) ×2 IMPLANT
BASKET LASER NITINOL 1.9FR (BASKET) IMPLANT
BASKET ZERO TIP 1.9FR (BASKET) ×2 IMPLANT
CATH INTERMIT  6FR 70CM (CATHETERS) ×2 IMPLANT
CLOTH BEACON ORANGE TIMEOUT ST (SAFETY) ×2 IMPLANT
COVER SURGICAL LIGHT HANDLE (MISCELLANEOUS) ×2 IMPLANT
FIBER LASER FLEXIVA 1000 (UROLOGICAL SUPPLIES) IMPLANT
FIBER LASER FLEXIVA 365 (UROLOGICAL SUPPLIES) IMPLANT
FIBER LASER FLEXIVA 550 (UROLOGICAL SUPPLIES) IMPLANT
FIBER LASER TRAC TIP (UROLOGICAL SUPPLIES) ×2 IMPLANT
GLOVE BIOGEL M STRL SZ7.5 (GLOVE) ×2 IMPLANT
GOWN STRL REUS W/TWL LRG LVL3 (GOWN DISPOSABLE) ×4 IMPLANT
GUIDEWIRE ANG ZIPWIRE 038X150 (WIRE) ×2 IMPLANT
GUIDEWIRE STR DUAL SENSOR (WIRE) ×2 IMPLANT
IV NS 1000ML (IV SOLUTION) ×1
IV NS 1000ML BAXH (IV SOLUTION) ×1 IMPLANT
MANIFOLD NEPTUNE II (INSTRUMENTS) ×2 IMPLANT
PACK CYSTO (CUSTOM PROCEDURE TRAY) ×2 IMPLANT
SHEATH ACCESS URETERAL 24CM (SHEATH) IMPLANT
SHEATH ACCESS URETERAL 38CM (SHEATH) IMPLANT
SHEATH ACCESS URETERAL 54CM (SHEATH) IMPLANT
STENT URET 6FRX26 CONTOUR (STENTS) ×2 IMPLANT
SYR CONTROL 10ML LL (SYRINGE) ×2 IMPLANT
TUBE FEEDING 8FR 16IN STR KANG (MISCELLANEOUS) ×2 IMPLANT
TUBING CONNECTING 10 (TUBING) ×2 IMPLANT

## 2016-10-04 NOTE — OR Nursing (Signed)
Stone taken per Dr. Manny 

## 2016-10-04 NOTE — Discharge Instructions (Signed)
1 - You may have urinary urgency (bladder spasms) and bloody urine on / off with stent in place. This is normal. ° °2 - Call MD or go to ER for fever >102, severe pain / nausea / vomiting not relieved by medications, or acute change in medical status ° °

## 2016-10-04 NOTE — Transfer of Care (Signed)
Immediate Anesthesia Transfer of Care Note  Patient: CAEL WORTH  Procedure(s) Performed: Procedure(s): CYSTOSCOPY/RETROGRADE/URETEROSCOPY/STONE EXTRACTION WITH BASKET/ STENT (Right)  Patient Location: PACU  Anesthesia Type:General  Level of Consciousness: awake and alert   Airway & Oxygen Therapy: Patient Spontanous Breathing and Patient connected to nasal cannula oxygen  Post-op Assessment: Report given to RN and Post -op Vital signs reviewed and stable  Post vital signs: Reviewed and stable  Last Vitals:  Vitals:   10/04/16 1056  BP: 137/78  Pulse: 77  Resp: 20  Temp: 36.6 C    Last Pain:  Vitals:   10/04/16 1056  TempSrc: Oral      Patients Stated Pain Goal: 3 (55/01/58 6825)  Complications: No apparent anesthesia complications

## 2016-10-04 NOTE — Anesthesia Postprocedure Evaluation (Addendum)
Anesthesia Post Note  Patient: Frank [name]  Procedure(s) Performed: Procedure(s) (LRB): CYSTOSCOPY/RETROGRADE/URETEROSCOPY/STONE EXTRACTION WITH BASKET/ STENT (Right)  Patient location during evaluation: PACU Anesthesia Type: General Level of consciousness: awake Pain management: pain level controlled Vital Signs Assessment: post-procedure vital signs reviewed and stable Respiratory status: spontaneous breathing Cardiovascular status: stable Postop Assessment: no signs of nausea or vomiting Anesthetic complications: no        Last Vitals:  Vitals:   10/04/16 1515 10/04/16 1530  BP: 126/75 123/74  Pulse: 77 73  Resp: 18 19  Temp:  36.5 C    Last Pain:  Vitals:   10/04/16 1056  TempSrc: Oral   Pain Goal: Patients Stated Pain Goal: 3 (10/04/16 1107)               Tanetta Fuhriman JR,JOHN Mateo Flow

## 2016-10-04 NOTE — Brief Op Note (Signed)
10/04/2016  2:41 PM  PATIENT:  Aida Raider  61 y.o. male  PRE-OPERATIVE DIAGNOSIS:  RIGHT URETERAL STONE, RIGHT HYDRONEPHROSIS  POST-OPERATIVE DIAGNOSIS:  RIGHT URETERAL STONE, RIGHT HYDRONEPHROSIS  PROCEDURE:  Procedure(s): CYSTOSCOPY/RETROGRADE/URETEROSCOPY/STONE EXTRACTION WITH BASKET/ STENT (Right)  SURGEON:  Surgeon(s) and Role:    * Alexis Frock, MD - Primary  PHYSICIAN ASSISTANT:   ASSISTANTS: none   ANESTHESIA:   general  EBL:  No intake/output data recorded.  BLOOD ADMINISTERED:none  DRAINS: none   LOCAL MEDICATIONS USED:  NONE  SPECIMEN:  Source of Specimen:  Rt ureteral stone fragments  DISPOSITION OF SPECIMEN:  [facility] for compositional analysis  COUNTS:  YES  TOURNIQUET:  * No tourniquets in log *  DICTATION: .Other Dictation: Dictation Number [id number]  PLAN OF CARE: Discharge to home after PACU  PATIENT DISPOSITION:  PACU - hemodynamically stable.   Delay start of Pharmacological VTE agent (>24hrs) due to surgical blood loss or risk of bleeding: not applicable

## 2016-10-04 NOTE — Anesthesia Procedure Notes (Signed)
Procedure Name: LMA Insertion Date/Time: 10/04/2016 2:08 PM Performed by: Cynda Familia Pre-anesthesia Checklist: Patient identified, Emergency Drugs available, Suction available and Patient being monitored Patient Re-evaluated:Patient Re-evaluated prior to inductionOxygen Delivery Method: Circle System Utilized Preoxygenation: Pre-oxygenation with 100% oxygen Intubation Type: IV induction Ventilation: Mask ventilation without difficulty LMA: LMA inserted LMA Size: 4.0 Tube type: Oral Number of attempts: 1 Airway Equipment and Method: Bite block Placement Confirmation: positive ETCO2 Tube secured with: Tape Dental Injury: Teeth and Oropharynx as per pre-operative assessment  Comments: smooth IV induction Hatchette--- LMA AM CRNA atraumatic-- teeth and mouth as preop--- many missing teeth ---- bilat BS

## 2016-10-04 NOTE — Interval H&P Note (Signed)
History and Physical Interval Note:  10/04/2016 1:13 PM  Frank [name]  has presented today for surgery, with the diagnosis of RIGHT URETERAL STONE, RIGHT HYDRONEPHROSIS  The various methods of treatment have been discussed with the patient and family. After consideration of risks, benefits and other options for treatment, the patient has consented to  Procedure(s): CYSTOSCOPY/RETROGRADE/URETEROSCOPY/STONE EXTRACTION WITH BASKET/ STENT (Right) as a surgical intervention .  The patient's history has been reviewed, patient examined, no change in status, stable for surgery.  I have reviewed the patient's chart and labs.  Questions were answered to the patient's satisfaction.     Shacora Zynda

## 2016-10-04 NOTE — H&P (Signed)
Frank [name] is an 61 y.o. male.    Chief Complaint: Pre-op RIGHT Ureteroscopic Stone Manipulation  HPI:   1 - Right Ureteral Stone - s/p shockwave lithotripsy 06/2016 for 66mm Rt mid ureteral stone by ER CT 06/2016 on eval flank pain. Stone is 39mm, mid ureter, 900 HU, 11cm SSD and adjacent to L5-S1 on scout images. Two small ipsilateral renal stones as well. Stone is visible by KUB.   He had initial good fragmentation by KUB but has residual distal fragment with persistant hydro that has failed medical therapy x few mos. Most recent UA without infectious parameters.   PMH sig for COPD (O2 at al times), no CV disease. His PCP is with [facility].   Today "Frank [name]" is seen to proceed with ureteroscopy for retained fragment after lithotripsy NO interval fevers.    Past Medical History:  Diagnosis Date  . COPD (chronic obstructive pulmonary disease) ([place]) 06/05/2012  . Dyspnea   . Dysrhythmia   . Emphysema 06/07/2012  . Heart murmur    hx of as baby  . History of kidney stones   . Hypoxemia 06/07/2012  . Obstructive chronic bronchitis with exacerbation ([place]) 06/07/2012  . Pneumonia   . Tobacco abuse 06/05/2012    Past Surgical History:  Procedure Laterality Date  . CATARACT EXTRACTION, BILATERAL    . EXTRACORPOREAL SHOCK WAVE LITHOTRIPSY Right 06/29/2016   Procedure: RIGHT EXTRACORPOREAL SHOCK WAVE LITHOTRIPSY (ESWL);  Surgeon: Ardis Hughs, MD;  Location: [facility];  Service: Urology;  Laterality: Right;    Family History  Problem Relation Age of Onset  . Colon polyps Father   . Heart attack Father 85  . Colon polyps Sister    Social History:  reports that he quit smoking about 16 months ago. His smoking use included Cigarettes. He has a 17.50 pack-year smoking history. He quit smokeless tobacco use about 48 years ago. His smokeless tobacco use included Chew. He reports that he does not drink alcohol or use drugs.  Allergies: No Known Allergies  No prescriptions prior to  admission.    No results found for this or any previous visit (from the past 48 hour(s)). No results found.  Review of Systems  Constitutional: Negative.  Negative for chills and fever.  HENT: Negative.   Eyes: Negative.   Respiratory: Negative.   Cardiovascular: Negative.   Gastrointestinal: Negative.   Genitourinary: Positive for flank pain.  Skin: Negative.   Neurological: Negative.   Endo/Heme/Allergies: Negative.   Psychiatric/Behavioral: Negative.     There were no vitals taken for this visit. Physical Exam  Constitutional: He appears well-developed.  HENT:  Head: Normocephalic.  Neck: Normal range of motion.  Cardiovascular: Normal rate.   Respiratory: Effort normal.  GI: Soft.  Genitourinary:  Genitourinary Comments: Very mild Rt CVAT  Musculoskeletal: Normal range of motion.  Neurological: He is alert.  Skin: Skin is warm.  Psychiatric: He has a normal mood and affect.     Assessment/Plan  Proceed as planned with RIGHT ureteroscopy with goal of right side stone free. Risks, benefits, alternatives, expected peri-op course discussed previously and reiterated today.   Alexis Frock, MD 10/04/2016, 6:53 AM

## 2016-10-04 NOTE — Anesthesia Preprocedure Evaluation (Signed)
Anesthesia Evaluation  Patient identified by MRN, date of birth, ID band Patient awake    Reviewed: Allergy & Precautions, NPO status , Patient's Chart, lab work & pertinent test results  Airway Mallampati: II       Dental  (+) Poor Dentition, Missing, Dental Advisory Given   Pulmonary former smoker,    Pulmonary exam normal breath sounds clear to auscultation       Cardiovascular  Rhythm:Regular Rate:Normal     Neuro/Psych negative neurological ROS  negative psych ROS   GI/Hepatic negative GI ROS, Neg liver ROS,   Endo/Other  negative endocrine ROS  Renal/GU negative Renal ROS     Musculoskeletal negative musculoskeletal ROS (+)   Abdominal Normal abdominal exam  (+)   Peds  Hematology negative hematology ROS (+)   Anesthesia Other Findings   Reproductive/Obstetrics                             Anesthesia Physical Anesthesia Plan  ASA: II  Anesthesia Plan: General   Post-op Pain Management:    Induction: Intravenous  Airway Management Planned: LMA  Additional Equipment:   Intra-op Plan:   Post-operative Plan:   Informed Consent: I have reviewed the patients History and Physical, chart, labs and discussed the procedure including the risks, benefits and alternatives for the proposed anesthesia with the patient or authorized representative who has indicated his/her understanding and acceptance.   Dental advisory given  Plan Discussed with: CRNA and Surgeon  Anesthesia Plan Comments:         Anesthesia Quick Evaluation

## 2016-10-05 ENCOUNTER — Encounter (HOSPITAL_COMMUNITY): Payer: Self-pay | Admitting: Urology

## 2016-10-05 DIAGNOSIS — N202 Calculus of kidney with calculus of ureter: Secondary | ICD-10-CM | POA: Diagnosis not present

## 2016-10-05 NOTE — Op Note (Signed)
NAME:  Frank [name], Frank [name]                     ACCOUNT NO.:  MEDICAL RECORD NO.:  [id number]  LOCATION:                                 FACILITY:  PHYSICIAN:  Alexis Frock, MD          DATE OF BIRTH:  DATE OF PROCEDURE: 10/04/2016                              OPERATIVE REPORT   DIAGNOSIS:  Right ureteral stone, persistent.  PROCEDURE: 1. Cystoscopy, right retrograde pyelogram interpretation. 2. Right ureteroscopy with laser lithotripsy. 3. Insertion of right ureteral stent, 6 x 26 Contour, no tether.  ESTIMATED BLOOD LOSS:  Nil.  COMPLICATION:  None.  SPECIMENS:  Right ureteral stone fragments to compositional analysis.  FINDINGS: 1. Significantly impacted right distal ureteral stone just above the     intramural ureter with a moderate hydronephrosis above this. 2. Successful removal of all stone fragments larger than 1/3rd mm     following laser lithotripsy and basket extraction in the right     ureter. 3. Successful placement of right ureteral stent, proximal in renal     pelvis and distal in urinary bladder.  INDICATION:  Frank [name] is a pleasant 61 year old gentleman with recent history of right ureteral stone.  It was initially managed with shockwave lithotripsy several months ago.  The stone fragmented well and displaced anterograde; however, he failed to pass all fragments and had some mild residual colic.  He was surveilled with serial imaging and found to have non-progression of his retained fragments as well as significant residual hydronephrosis.  Options were discussed for further management including repeat lithotripsy versus ureteroscopy with the latter being felt to be the most definitive and he wished to proceed.  Informed consent was obtained and placed in the medical record.  PROCEDURE IN DETAIL:  The patient being, Frank [name], was verified.  Procedure being, right ureteroscopic stone manipulation, was confirmed.  Procedure was carried out.  Time-out  was performed. Intravenous antibiotics were administered.  General anesthesia introduced.  The patient was placed into a low lithotomy position. Sterile field was created by prepping and draping the patient's penis, perineum, proximal thighs using iodine.  Next, cystourethroscopy was performed using a 22-French rigid cystoscope with offset lens. Inspection of the anterior and posterior urethra unremarkable. Inspection of urinary bladder revealed no diverticula, calcifications, papillary lesions.  The right ureteral orifice was cannulated with a 6- French end-hole catheter and right retrograde pyelogram was obtained.  Right retrograde pyelogram demonstrated a single right ureter with single-system right kidney.  There was a filling defect in distal ureter Consistent with known  stone with moderate to severe hydronephrosis above this.  A 0.038 ZIPwire was advanced to the level of lower pole set aside as a safety wire.  An 8-French feeding tube placed in urinary bladder for pressure release.  Next, semi-rigid ureteroscopy from the distal ureter along a separate Sensor working wire.  As expected, there was a right distal ureteral stone.  This was quite impacted with significant mucosal edema.  This appeared to be much too large for simple basketing. As such, holmium laser energy applied to the stone using settings of 0.3 joules and 30 Hz, fragmenting the  stone approximately into 4 smaller pieces.  These were then grasped sequentially on their long axis, removed, and set aside for compositional analysis.  Following these maneuvers, excellent hemostasis.  No evidence of residual stone fragments larger than 1/3rd mm with inspection of the distal two-thirds of right ureter.  Given the significant impaction, mucosal edema, incidentally hydronephrosis, it was felt that interval stenting with non tether would be warranted.  As such, a new 6 x 26 Contour type stent was placed over the remaining  safety wire using cystoscopic and fluoroscopic guidance.  Good proximal and distal deployment were noted.  Efflux of urine was seen around into the distal end of the stent.  Bladder was emptied per cystoscope.  Procedure was then terminated.  The patient tolerated the procedure well.  There were no immediate periprocedural complications.  The patient was taken to the postanesthesia care in stable condition.          ______________________________ Alexis Frock, MD     TM/MEDQ  D:  10/04/2016  T:  10/05/2016  Job:  [id number]

## 2016-10-09 ENCOUNTER — Encounter (HOSPITAL_COMMUNITY): Payer: Self-pay | Admitting: Urology

## 2016-10-09 ENCOUNTER — Encounter: Payer: Self-pay | Admitting: *Deleted

## 2016-10-11 ENCOUNTER — Encounter: Payer: Self-pay | Admitting: *Deleted

## 2016-10-13 NOTE — Addendum Note (Signed)
Addendum  created 10/13/16 1303 by Lyn Hollingshead, MD   Sign clinical note

## 2016-10-17 ENCOUNTER — Other Ambulatory Visit: Payer: Self-pay | Admitting: Emergency Medicine

## 2016-10-17 ENCOUNTER — Telehealth: Payer: Self-pay | Admitting: Emergency Medicine

## 2016-10-17 NOTE — Telephone Encounter (Signed)
Spoke with pt. He is needing a refill on Fluticasone. Rx has already been sent in today. Nothing further was needed.

## 2016-10-20 DIAGNOSIS — J449 Chronic obstructive pulmonary disease, unspecified: Secondary | ICD-10-CM | POA: Diagnosis not present

## 2016-10-23 ENCOUNTER — Ambulatory Visit: Payer: Medicare Other | Admitting: Dietician

## 2016-10-23 ENCOUNTER — Encounter: Payer: Self-pay | Admitting: Internal Medicine

## 2016-10-23 ENCOUNTER — Ambulatory Visit (INDEPENDENT_AMBULATORY_CARE_PROVIDER_SITE_OTHER): Payer: Medicare Other | Admitting: Internal Medicine

## 2016-10-23 DIAGNOSIS — Z7189 Other specified counseling: Secondary | ICD-10-CM | POA: Diagnosis not present

## 2016-10-23 DIAGNOSIS — Z972 Presence of dental prosthetic device (complete) (partial): Secondary | ICD-10-CM | POA: Insufficient documentation

## 2016-10-23 DIAGNOSIS — K0889 Other specified disorders of teeth and supporting structures: Secondary | ICD-10-CM | POA: Insufficient documentation

## 2016-10-23 NOTE — Patient Instructions (Addendum)
Thank you for having this conversation and participating in this project.  When you are done with your form, please call [phone]. If your health changes, it is worth revisiting it.   It has been a pleasure caring for you, and I wish you well.

## 2016-10-23 NOTE — Progress Notes (Signed)
Assisted with [facility] today by going through pre[pare for your care online tutorial with Frank [name] and his sister. Copies of handouts from the program were given to Dr. Posey Pronto and patient.

## 2016-10-24 DIAGNOSIS — N202 Calculus of kidney with calculus of ureter: Secondary | ICD-10-CM | POA: Diagnosis not present

## 2016-10-24 NOTE — ACP (Advance Care Planning) (Signed)
I spent 30 minutes in advance care planning today with the patient today as outlined below.  Who would be the best person to help the doctors make decisions? [Numbers updated in the EMR.] He would like sisters to make a joint decision on his behalf: Frank [name] [name] in [place], [name], and Frank [name] [out-of-state].  What matters most to you? He enjoys his independent living and the ability to exert himself with yardwork and chores albeit limited by his oxygen dependence. He cannot imagine living without the ability to perform his basic ADLs, like if he were "to become a vegetable." He also would never want to be a burden to others.  What experiences have you had with serious illness or death? His [name] described how their mother had COPD and was completely oxygen dependent in her final years. She passed away in her own home and declined rehospitalization prior to death.   If you were very sick, what would be most important to you? To try treatments for a period of time, but stop if it looks likes it means giving up what matters most to you He has never witnessed ACLS or mechanical intubation in person before. With prognostication, he would like to hear it "without sugar coating" BUT would never want to hear how much time he had left  How much flexibility should you afford your decision-maker? Complete flexibility - make decisions on my behalf even if they may be different from my own wishes  Do you have anything at home to show others what we talked about to help them honor your wishes? No but have started the process He and his [name] were provided with a copy of the advance directive booklet with instructions of calling the spiritual care office to notarize and witness the document. Should they have any difficulty, they were welcome to call us with questions.

## 2016-10-24 NOTE — Assessment & Plan Note (Addendum)
I spent 30 minutes in advance care planning today with the patient today as outlined below.  Who would be the best person to help the doctors make decisions? [Numbers updated in the EMR.] He would like sisters to make a joint decision on his behalf: Patric Dy[facility] in [place], [place], and Doree Albee [out-of-state].  What matters most to you? He enjoys his independent living and the ability to exert himself with yardwork and chores albeit limited by his oxygen dependence. He cannot imagine living without the ability to perform his basic ADLs, like if he were "to become a vegetable." He also would never want to be a burden to others.  What experiences have you had with serious illness or death? His sister [name] described how their mother had COPD and was completely oxygen dependent in her final years. She passed away in her own home and declined rehospitalization prior to death.   If you were very sick, what would be most important to you? To try treatments for a period of time, but stop if it looks likes it means giving up what matters most to you He has never witnessed ACLS or mechanical intubation in person before. With prognostication, he would like to hear it "without sugar coating" BUT would never want to hear how much time he had left  How much flexibility should you afford your decision-maker? Complete flexibility - make decisions on my behalf even if they may be different from my own wishes  Do you have anything at home to show others what we talked about to help them honor your wishes? No but have started the process He and his sister [name] were provided with a copy of the advance directive booklet with instructions of calling the spiritual care office to notarize and witness the document. Should they have any difficulty, they were welcome to call us with questions.

## 2016-10-24 NOTE — Progress Notes (Signed)
   CC: advance care planning  HPI:  [name] is a 61 y.o. who presents today with his [name] for advance care planning. Please see assessment & plan for status of chronic medical problems.   Past Medical History:  Diagnosis Date  . COPD (chronic obstructive pulmonary disease) ([place]) 06/05/2012  . Dyspnea   . Dysrhythmia   . Emphysema 06/07/2012  . Heart murmur    hx of as baby  . History of kidney stones   . Hypoxemia 06/07/2012  . Obstructive chronic bronchitis with exacerbation ([place]) 06/07/2012  . Pneumonia   . Tobacco abuse 06/05/2012    Review of Systems:  Please see each problem below for a pertinent review of systems.  Physical Exam:  Vitals:   10/23/16 1409  BP: 135/77  Pulse: 73  Temp: 98.3 F (36.8 C)  TempSrc: Oral  SpO2: 99%  Weight: 162 lb 9.6 oz (73.8 kg)  Height: 5\' 6"  (1.676 m)   Physical Exam  Constitutional: He is oriented to person, place, and time. No distress.  Chronically ill-appearing man. Speaks full sentences.  HENT:  Head: Normocephalic and atraumatic.  Eyes: Conjunctivae are normal. No scleral icterus.  Pulmonary/Chest: Effort normal. No respiratory distress.  Neurological: He is alert and oriented to person, place, and time.  Skin: He is not diaphoretic.       Assessment & Plan:   See Encounters Tab for problem based charting.  Patient discussed with Dr. Lynnae January

## 2016-10-25 NOTE — Progress Notes (Signed)
Internal Medicine Clinic Attending  Case discussed with Dr. Patel,Rushil at the time of the visit.  We reviewed the resident's history and exam and pertinent patient test results.  I agree with the assessment, diagnosis, and plan of care documented in the resident's note.  

## 2016-11-01 DIAGNOSIS — J449 Chronic obstructive pulmonary disease, unspecified: Secondary | ICD-10-CM | POA: Diagnosis not present

## 2016-11-27 ENCOUNTER — Ambulatory Visit (INDEPENDENT_AMBULATORY_CARE_PROVIDER_SITE_OTHER): Payer: Medicare Other | Admitting: Emergency Medicine

## 2016-11-27 ENCOUNTER — Encounter: Payer: Self-pay | Admitting: Emergency Medicine

## 2016-11-27 DIAGNOSIS — J449 Chronic obstructive pulmonary disease, unspecified: Secondary | ICD-10-CM | POA: Diagnosis not present

## 2016-11-27 NOTE — Assessment & Plan Note (Signed)
Please continue your current medications as you have been taking them Continue oxygen at 3-4 L/m Get your flu shot in the fall Your pneumonia shots are up-to-date Follow with Dr Lamonte Sakai in 6 months or sooner if you have any problems

## 2016-11-27 NOTE — Progress Notes (Signed)
Subjective:    Patient ID: Frank [name], male    DOB: 1956/02/05, 61 y.o.   MRN: [id number]  HPI 61 yo smoker admitted 06/05/12 for COPD exacerbation .  PCCM w/ initial consult   06/24/12 -- [facility] follow up  Pt w/ progressive cough, dyspnea and wheezing 4 days PTA . Admitted started on aggressive pulmonary hygiene regimen w/ nebs, steroids and abx.  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  Has FH of ? Lung dz -parent deceased.   Since discharge he is feeling better Less dyspnea and cough  Started on advair and spiriva at discharge Finished steroids taper.  Walking sats in office w/out desat .  Wants to go back to work.  No hemoptysis , chest pain or edema  CXR showed COPD changes .  No previous dx of COPD , no PFT in past.    ROV 08/30/12 -- COPD, seen by me as hosp consult in 1/'14 when he was admitted for AE. Returns today after PFT >> severe AFL, hyperinflation, decreased DLCO. He is on advair + spiriva. Uses albuterol once in the am. He feels that he is getting back to baseline - actually better fxn than before he was hospitalized. He was discharged on 3L/min at all times, he has been using prn. 3 weeks ago he had nasal congestion, treated symptomatically. No wheeze, rare cough, produces clear mucous.   ROV 12/31/12 -- follow up for GOLD b COPD, managed on Spiriva + Advair. He feels much better. Has NOT restarted smoking. He used to average 1 AE a year. He is UTD on pneumovax. His functional capacity is good w walking, but some limitation with heavier work. 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 No wheeze.   ROV 05/14/13 -- follow up for GOLD b COPD, managed on Spiriva + Advair. He has been doing well, no flares since last visit. He has been compliant with his inhaled meds. He rarely has exertional limitation - only w heavy exercise. No wheeze or cough. Uses ventolin most mornings, sometimes twice a day.    ROV 11/13/13 -- follow up for GOLD c COPD, managed on Spiriva + Advair. He returns for a regular f/u. He believes that his breathing is close to the same, but he also describes needing to rest with carrying laundry, difficulty in the humidity of the shower. He wonders about whether he should try to get disability.  He no longer works, but he is active.   ROV 12/26/13 -- follows for COPD. Last time we changed Advair/ Spiriva to Anoro.  Not really sure that The Anoro has helped much. He is smoking again - about 1/2 pk a day. Uses SABA 2x a day.   ROV 03/11/14 -- hx of COPD, seen by TP 10/19 and treated with doxycycline; he had been seen earlier in the ED. Continues to smoke. Currently on Anoro. He is averaging 1 flare a year, usually in the Fall.  Due for Flu shot.   ROV 10/29/14 -- Follow-up visit for COPD, currently managed on Anoro. Uses albuterol when he doesn't recover with resting, a few times a week. He has occasional wheeze, exertional SOB. He often takes his O2 off to exert. He can sometimes exert, mow yard, etc. Without any trouble. Occasional cough.  He has cut down to 0.5pk/day. He tolerates Anoro.   ROV 05/25/15 -- follow-up visit for severe COPD. He was last seen by TP  in October 2016. She treated him for an acute exacerbation with prednisone taper, Mucinex. He continues to smoke. He is using Anoro daily.  He has been doing well since that visit. He has the same exertional tolerance, has more trouble outside especially in extreme temperatures, humidity. He is using SABA three times a day. Still does some yard work. He coughs occasionally but not every day. Blows a lot of nasal mucous. He is on mucinex. Able to walk 75 ft.   ROV 11/30/15 -- patient with a history of severe COPD, chronic rhinitis.  He stopped smoking last time, has not restarted. He has been having more dyspnea during the hot weather. Rare wheeze, rare cough. He is compliant with his O2 at 3L/min w exertion. On Anoro, tolerates well  and believes that it is helping him. He is on flonase nasal spray, helps his congestion. Occasionally uses mucinex. No exacerbations.   ROV 05/29/16 -- this is a follow-up visit for severe COPD with associated chronic hypoxemia. He has not restarted smoking. He reports that his breathing is fairly stable, is limited. He has good O2 compliance. He does have paroxysms of cough - can happen with talking, eating, non-productive. He uses albuterol about 2x a week. He remains on Anoro. He is on flonase.   ROV 11/27/16 -- Frank [name] has a history of severe COPD with associated chronic hypoxemic respiratory failure. Allergic rhinitis and some paroxysmal cough. He is currently using oxygen at 3 -4  L/m. He tells me that he has had kidney stones since our last visit - had to have one removed by Dr Tresa Moore. He tells me that his breathing is Ok - at baseline, has SOB especially in hot weather, humid. Some SOB with yard work but still able to do it, a little slower. He is on Anoro once a day. Uses albuterol about 0-2x a day depending on his activities and the weather. He is also on flonase prn. No flares since last time. Pneumonia vaccines UTD. Gets flu shot annually.    No flowsheet data found.  PULMONARY FUNCTON TEST 08/30/2012  FVC 3.93  FEV1 1.69  FEV1/FVC 43  FVC  % Predicted 93  FEV % Predicted 55  FeF 25-75 .71  FeF 25-75 % Predicted 3.05    Review of Systems     Objective:   Physical Exam Vitals:   11/27/16 1135  BP: 128/78  Pulse: 71  SpO2: 97%  Weight: 162 lb 9.6 oz (73.8 kg)  Height: 5\' 6"  (1.676 m)   Gen: Pleasant, well-nourished, in no distress,  normal affect  ENT: No lesions,  mouth clear,  oropharynx clear, no postnasal drip  Neck: No JVD, no TMG, no carotid bruits  Lungs: No use of accessory muscles, distant but clear. He does wheeze on a forced expiration.   Cardiovascular: RRR, heart sounds normal, no murmur or gallops, no peripheral edema  Musculoskeletal: No deformities,  no cyanosis or clubbing  Neuro: alert, non focal  Skin: Warm, no lesions or rashes     Assessment & Plan:  COPD (chronic obstructive pulmonary disease) ([place]) Please continue your current medications as you have been taking them Continue oxygen at 3-4 L/m Get your flu shot in the fall Your pneumonia shots are up-to-date Follow with Dr Lamonte Sakai in 6 months or sooner if you have any problems  [name], MD, PhD 11/27/2016, 11:58 AM [facility] [phone] or if no answer [phone]

## 2016-11-27 NOTE — Patient Instructions (Signed)
Please continue your current medications as you have been taking them Continue oxygen at 3-4 L/m Get your flu shot in the fall Your pneumonia shots are up-to-date Follow with Dr Lamonte Sakai in 6 months or sooner if you have any problems

## 2016-12-01 DIAGNOSIS — J449 Chronic obstructive pulmonary disease, unspecified: Secondary | ICD-10-CM | POA: Diagnosis not present

## 2016-12-12 DIAGNOSIS — J449 Chronic obstructive pulmonary disease, unspecified: Secondary | ICD-10-CM | POA: Diagnosis not present

## 2016-12-18 ENCOUNTER — Other Ambulatory Visit: Payer: Self-pay | Admitting: Emergency Medicine

## 2016-12-22 ENCOUNTER — Telehealth: Payer: Self-pay | Admitting: Emergency Medicine

## 2016-12-22 NOTE — Telephone Encounter (Signed)
lmomtcb x1 

## 2016-12-25 NOTE — Telephone Encounter (Signed)
Spoke with pt. He is needing a jury duty excuse letter. Advised the pt that we will need to see his jury summons before a letter can be written. He verbalized understanding. Will await pt's jury summons.

## 2016-12-25 NOTE — Telephone Encounter (Signed)
lmtcb x2 for pt. 

## 2016-12-25 NOTE — Telephone Encounter (Signed)
Pt returning call from Friday.  [phone]

## 2017-01-01 DIAGNOSIS — J449 Chronic obstructive pulmonary disease, unspecified: Secondary | ICD-10-CM | POA: Diagnosis not present

## 2017-01-01 NOTE — Telephone Encounter (Signed)
Will route to [place].  

## 2017-01-01 NOTE — Telephone Encounter (Signed)
Jury Summons was left, summons placed in RB folder at the checkout area.pt contact # [phone]....ert

## 2017-01-01 NOTE — Telephone Encounter (Signed)
lmtcb x1 for pt. 

## 2017-01-03 NOTE — Telephone Encounter (Signed)
Pt is requesting a jury duty excuse letter. We have received the pt's jury summons.  RB - are you okay with giving him this letter? Thanks.

## 2017-01-04 ENCOUNTER — Encounter: Payer: Self-pay | Admitting: *Deleted

## 2017-01-04 NOTE — Telephone Encounter (Signed)
Jury duty letter has been written. Letter has been placed in RB's sign folder. Will update chart once this is signed and returned to me.

## 2017-01-04 NOTE — Telephone Encounter (Signed)
signed

## 2017-01-05 NOTE — Telephone Encounter (Signed)
Letter is ready to be picked up. Pt is aware. Nothing further was needed.

## 2017-01-25 DIAGNOSIS — J449 Chronic obstructive pulmonary disease, unspecified: Secondary | ICD-10-CM | POA: Diagnosis not present

## 2017-02-01 DIAGNOSIS — J449 Chronic obstructive pulmonary disease, unspecified: Secondary | ICD-10-CM | POA: Diagnosis not present

## 2017-02-07 DIAGNOSIS — Z23 Encounter for immunization: Secondary | ICD-10-CM | POA: Diagnosis not present

## 2017-02-26 DIAGNOSIS — J449 Chronic obstructive pulmonary disease, unspecified: Secondary | ICD-10-CM | POA: Diagnosis not present

## 2017-03-01 ENCOUNTER — Other Ambulatory Visit: Payer: Self-pay | Admitting: Emergency Medicine

## 2017-03-02 DIAGNOSIS — J449 Chronic obstructive pulmonary disease, unspecified: Secondary | ICD-10-CM | POA: Diagnosis not present

## 2017-03-03 DIAGNOSIS — J449 Chronic obstructive pulmonary disease, unspecified: Secondary | ICD-10-CM | POA: Diagnosis not present

## 2017-04-03 DIAGNOSIS — J449 Chronic obstructive pulmonary disease, unspecified: Secondary | ICD-10-CM | POA: Diagnosis not present

## 2017-04-23 ENCOUNTER — Other Ambulatory Visit: Payer: Self-pay | Admitting: Emergency Medicine

## 2017-04-23 DIAGNOSIS — J449 Chronic obstructive pulmonary disease, unspecified: Secondary | ICD-10-CM | POA: Diagnosis not present

## 2017-04-24 DIAGNOSIS — N202 Calculus of kidney with calculus of ureter: Secondary | ICD-10-CM | POA: Diagnosis not present

## 2017-05-03 DIAGNOSIS — J449 Chronic obstructive pulmonary disease, unspecified: Secondary | ICD-10-CM | POA: Diagnosis not present

## 2017-05-21 ENCOUNTER — Telehealth: Payer: Self-pay | Admitting: Emergency Medicine

## 2017-05-21 DIAGNOSIS — J449 Chronic obstructive pulmonary disease, unspecified: Secondary | ICD-10-CM

## 2017-05-21 NOTE — Telephone Encounter (Signed)
Spoke with pt's sister she would like to switch DME company to [employer] because they do not deliver tanks anymore. She states [employer] will and would like to change over to them. I will place order to switch DME. I called Corene Cornea to see if this would be ok with his insurance. He stated he has Blue medicare so it shouldn't be a problem. I called [employer] and they stated they do deliver if he has the regular tanks but they cannot do it on POC's. Order placed. Nothing further is needed.

## 2017-05-24 ENCOUNTER — Telehealth: Payer: Self-pay | Admitting: Emergency Medicine

## 2017-05-24 NOTE — Telephone Encounter (Signed)
Will route this to [place] for her to be on the look out for the fax from pt.

## 2017-05-29 DIAGNOSIS — J449 Chronic obstructive pulmonary disease, unspecified: Secondary | ICD-10-CM | POA: Diagnosis not present

## 2017-05-29 NOTE — Telephone Encounter (Signed)
Called [facility] to see what is needed as order to switch was just placed on 05/21/17- needs new qualifying walk test for O2 and an ov note documenting need and benefit from O2 as pt has not been walked or seen in 6 months.   Pt is being seen tomorrow by RB- will obtain walk at this visit.  Will hold message open to send ov notes and walk after tomorrow's visit.   Routing to [facility] as FYI for pt's OV tomorrow.

## 2017-05-30 ENCOUNTER — Encounter: Payer: Self-pay | Admitting: Emergency Medicine

## 2017-05-30 ENCOUNTER — Ambulatory Visit: Payer: Medicare Other | Admitting: Emergency Medicine

## 2017-05-30 DIAGNOSIS — J301 Allergic rhinitis due to pollen: Secondary | ICD-10-CM

## 2017-05-30 DIAGNOSIS — J309 Allergic rhinitis, unspecified: Secondary | ICD-10-CM | POA: Insufficient documentation

## 2017-05-30 DIAGNOSIS — J9611 Chronic respiratory failure with hypoxia: Secondary | ICD-10-CM

## 2017-05-30 DIAGNOSIS — J449 Chronic obstructive pulmonary disease, unspecified: Secondary | ICD-10-CM | POA: Diagnosis not present

## 2017-05-30 DIAGNOSIS — Z9981 Dependence on supplemental oxygen: Secondary | ICD-10-CM

## 2017-05-30 NOTE — Assessment & Plan Note (Signed)
Continue oxygen at 3 L/min at all times 

## 2017-05-30 NOTE — Patient Instructions (Signed)
Stay on Anoro every day Take albuterol 2 puffs up to every 4 hours if needed for shortness of breath.  Continue oxygen at 3L/min at all times.  Try adding humidity to your oxygen concentrator to see if this helps with dryness.  Try using your flonase more frequently to see if this helps with your congestion.  Flu shot up to date.  Follow with Dr Lamonte Sakai in 6 months or sooner if you have any problems

## 2017-05-30 NOTE — Progress Notes (Signed)
Subjective:    Patient ID: Frank [name], male    DOB: Jul 31, 1955, 62 y.o.   MRN: [id number]  HPI 62 yo smoker admitted 06/05/12 for COPD exacerbation .   ROV 05/29/16 -- this is a follow-up visit for severe COPD with associated chronic hypoxemia. He has not restarted smoking. He reports that his breathing is fairly stable, is limited. He has good O2 compliance. He does have paroxysms of cough - can happen with talking, eating, non-productive. He uses albuterol about 2x a week. He remains on Anoro. He is on flonase.   ROV 11/27/16 -- [name] has a history of severe COPD with associated chronic hypoxemic respiratory failure. Allergic rhinitis and some paroxysmal cough. He is currently using oxygen at 3 -4  L/m. He tells me that he has had kidney stones since our last visit - had to have one removed by Dr Tresa Moore. He tells me that his breathing is Ok - at baseline, has SOB especially in hot weather, humid. Some SOB with yard work but still able to do it, a little slower. He is on Anoro once a day. Uses albuterol about 0-2x a day depending on his activities and the weather. He is also on flonase prn. No flares since last time. Pneumonia vaccines UTD. Gets flu shot annually.   ROV 05/30/17 --62 year old gentleman with a history of severe COPD and associated chronic hypoxemic respiratory failure.  He underwent re-titration with ambulation today, requires 3 L/min with walking.  Currently managed on Anoro. He has not had any AE since last time. His breathing is about the same. He is having dryness when he is on his concentrator, was told by [facility] that he doesn't need to add water. He is having a lot of head congestion, uses flonase with some relief, prn. Uses albuterol rarely. Often w heavier exertion. Flu shot up to date.    No flowsheet data found.  PULMONARY FUNCTON TEST 08/30/2012  FVC 3.93  FEV1 1.69  FEV1/FVC 43  FVC  % Predicted 93  FEV % Predicted 55  FeF 25-75 .71  FeF 25-75 % Predicted 3.05     Review of Systems     Objective:   Physical Exam Vitals:   05/30/17 1354  BP: 140/62  Pulse: 87  SpO2: 91%  Weight: 165 lb (74.8 kg)  Height: 5\' 6"  (1.676 m)   Gen: Pleasant, well-nourished, in no distress,  normal affect  ENT: No lesions,  mouth clear,  oropharynx clear, no postnasal drip  Neck: No JVD, no TMG, no carotid bruits  Lungs: No use of accessory muscles, distant but clear.  No wheezing today  Cardiovascular: RRR, heart sounds normal, no murmur or gallops, no peripheral edema  Musculoskeletal: No deformities, no cyanosis or clubbing  Neuro: alert, non focal  Skin: Warm, no lesions or rashes     Assessment & Plan:  COPD (chronic obstructive pulmonary disease) ([place]) Stay on Anoro every day Take albuterol 2 puffs up to every 4 hours if needed for shortness of breath.  Try adding humidity to your oxygen concentrator to see if this helps with dryness.  Try using your flonase more frequently to see if this helps with your congestion.  Flu shot up to date.  Follow with Dr Lamonte Sakai in 6 months or sooner if you have any problems  Chronic respiratory failure with hypoxia, on home O2 therapy ([place]) Continue oxygen at 3L/min at all times.   Allergic rhinitis Discussed increasing Flonase to every day to see  if he benefits  [name], MD, PhD 05/30/2017, 2:23 PM [facility] [phone] or if no answer [phone]

## 2017-05-30 NOTE — Assessment & Plan Note (Signed)
Discussed increasing Flonase to every day to see if he benefits

## 2017-05-30 NOTE — Assessment & Plan Note (Signed)
Stay on Anoro every day Take albuterol 2 puffs up to every 4 hours if needed for shortness of breath.  Try adding humidity to your oxygen concentrator to see if this helps with dryness.  Try using your flonase more frequently to see if this helps with your congestion.  Flu shot up to date.  Follow with Dr Lamonte Sakai in 6 months or sooner if you have any problems

## 2017-05-31 NOTE — Telephone Encounter (Signed)
Pt was seen 05/30/2017. Walk test was done and documented. Nothing further was needed.

## 2017-06-03 DIAGNOSIS — J449 Chronic obstructive pulmonary disease, unspecified: Secondary | ICD-10-CM | POA: Diagnosis not present

## 2017-06-22 ENCOUNTER — Telehealth: Payer: Self-pay | Admitting: Emergency Medicine

## 2017-06-22 DIAGNOSIS — Z9981 Dependence on supplemental oxygen: Secondary | ICD-10-CM

## 2017-06-22 DIAGNOSIS — J9611 Chronic respiratory failure with hypoxia: Secondary | ICD-10-CM

## 2017-06-22 NOTE — Telephone Encounter (Signed)
Called and spoke with pt's sister, [name].  [name] states that [facility] did not receive order placed on 05/21/17 for O2.  Lattie Haw with [facility], states order placed on 05/21/17 was canceled due to missing documentation (sats).  I have placed order again.   [name] is aware. Nothing further is needed.

## 2017-06-25 ENCOUNTER — Telehealth: Payer: Self-pay | Admitting: Emergency Medicine

## 2017-06-25 NOTE — Telephone Encounter (Signed)
Printed [facility] note and office note from 05/30/17

## 2017-06-25 NOTE — Telephone Encounter (Signed)
[facility] please advise, called and spoke with Leroy Sea from [place] with customer service, he states that patient is needing to have a 6 minute walk done and sent to them for the order. Patient had a walk within his last visit at 1.23.19. Can you please help with this. Thanks.

## 2017-07-02 ENCOUNTER — Telehealth: Payer: Self-pay | Admitting: Emergency Medicine

## 2017-07-02 NOTE — Telephone Encounter (Signed)
Spoke with patient. He is still waiting on his O2 from [place]. He called [employer] earlier today and was told that our office has not placed the order for his O2. Advised him that per his chart, an order was placed on 05/21/17 and 06/22/17 for his O2. Also advised patient that I would call [employer] to check on the status. He verbalized understanding.   Spoke with Magda Paganini at [facility]. She stated that it appears that the patient has been receiving O2 from another company (per his chart, he has been receiving his O2 via [facility]). Due to the patient's insurance [facility]), his O2 with [facility] is already paid for for the year. It will be very hard for the patient to switch DMEs.   Left message for patient.

## 2017-07-03 NOTE — Telephone Encounter (Signed)
Spoke with pt and advised message from CP. Pt understood and will stick with [facility] for now. I advised him to call them and ask when his year will be up and then we can switch him to another DME at that time. Pt agreed and will let us know. Nothing further is needed.

## 2017-07-04 DIAGNOSIS — J449 Chronic obstructive pulmonary disease, unspecified: Secondary | ICD-10-CM | POA: Diagnosis not present

## 2017-07-20 DIAGNOSIS — J449 Chronic obstructive pulmonary disease, unspecified: Secondary | ICD-10-CM | POA: Diagnosis not present

## 2017-07-28 ENCOUNTER — Other Ambulatory Visit: Payer: Self-pay | Admitting: Emergency Medicine

## 2017-08-01 DIAGNOSIS — J449 Chronic obstructive pulmonary disease, unspecified: Secondary | ICD-10-CM | POA: Diagnosis not present

## 2017-08-04 ENCOUNTER — Other Ambulatory Visit: Payer: Self-pay | Admitting: Emergency Medicine

## 2017-08-30 ENCOUNTER — Other Ambulatory Visit: Payer: Self-pay | Admitting: Emergency Medicine

## 2017-08-30 ENCOUNTER — Telehealth: Payer: Self-pay | Admitting: Emergency Medicine

## 2017-08-30 NOTE — Telephone Encounter (Signed)
Spoke with pt, states that [facility] called him to have our office call [place] to provide them with his current O2 settings.  I verified with pt that he still uses 3lpm qhs and with exertion as documented in his chart. Called and spoke with Corene Cornea at [facility] who documented this and verified that nothing further is needed.  Will close encounter.

## 2017-08-31 DIAGNOSIS — J449 Chronic obstructive pulmonary disease, unspecified: Secondary | ICD-10-CM | POA: Diagnosis not present

## 2017-09-01 DIAGNOSIS — J449 Chronic obstructive pulmonary disease, unspecified: Secondary | ICD-10-CM | POA: Diagnosis not present

## 2017-09-05 DIAGNOSIS — J449 Chronic obstructive pulmonary disease, unspecified: Secondary | ICD-10-CM | POA: Diagnosis not present

## 2017-10-01 DIAGNOSIS — J449 Chronic obstructive pulmonary disease, unspecified: Secondary | ICD-10-CM | POA: Diagnosis not present

## 2017-10-11 DIAGNOSIS — J449 Chronic obstructive pulmonary disease, unspecified: Secondary | ICD-10-CM | POA: Diagnosis not present

## 2017-11-01 DIAGNOSIS — J449 Chronic obstructive pulmonary disease, unspecified: Secondary | ICD-10-CM | POA: Diagnosis not present

## 2017-11-15 DIAGNOSIS — J449 Chronic obstructive pulmonary disease, unspecified: Secondary | ICD-10-CM | POA: Diagnosis not present

## 2017-11-27 ENCOUNTER — Encounter: Payer: Self-pay | Admitting: Emergency Medicine

## 2017-11-27 ENCOUNTER — Ambulatory Visit: Payer: Medicare Other | Admitting: Emergency Medicine

## 2017-11-27 DIAGNOSIS — J301 Allergic rhinitis due to pollen: Secondary | ICD-10-CM

## 2017-11-27 DIAGNOSIS — Z9981 Dependence on supplemental oxygen: Secondary | ICD-10-CM | POA: Diagnosis not present

## 2017-11-27 DIAGNOSIS — J449 Chronic obstructive pulmonary disease, unspecified: Secondary | ICD-10-CM

## 2017-11-27 DIAGNOSIS — J9611 Chronic respiratory failure with hypoxia: Secondary | ICD-10-CM

## 2017-11-27 NOTE — Patient Instructions (Signed)
Please continue Anoro once daily as you have been taking it. Use your albuterol nebulizer as scheduled. Keep albuterol inhaler available to use 2 puffs if needed for shortness of breath, cough, wheezing, chest tightness. Keep Flonase available to use as needed for nasal congestion. Wear your oxygen at 3 to 4 L/min depending on your level of exertion. Your pneumonia shots are up-to-date.  You will need a booster of your Pneumovax after age 64. Get the flu shot this fall. Follow with Dr Lamonte Sakai in 6 months or sooner if you have any problems

## 2017-11-27 NOTE — Assessment & Plan Note (Signed)
Keep Flonase available to use as needed for nasal congestion.

## 2017-11-27 NOTE — Assessment & Plan Note (Signed)
Wear your oxygen at 3 to 4 L/min depending on your level of exertion.

## 2017-11-27 NOTE — Assessment & Plan Note (Signed)
Please continue Anoro once daily as you have been taking it. Use your albuterol nebulizer as scheduled. Keep albuterol inhaler available to use 2 puffs if needed for shortness of breath, cough, wheezing, chest tightness. Your pneumonia shots are up-to-date.  You will need a booster of your Pneumovax after age 64. Get the flu shot this fall. Follow with Dr Lamonte Sakai in 6 months or sooner if you have any problems

## 2017-11-27 NOTE — Progress Notes (Signed)
  Subjective:    Patient ID: Frank [name], male    DOB: 01-05-1956, 62 y.o.   MRN: [id number]  COPD  His past medical history is significant for COPD.   62 yo former smoker with COPD.   ROV 11/27/17 --patient has a history of severe COPD with associated chronic hypoxemic respiratory failure.  He uses 3 L/min with exertion.  He reports that since our last visit he has been stable. He does turn his O2 up to 4L/min with heavy exertion. He is on Anoro, has been doing well. Uses albuterol tid via nebulizer. No flares since last time. He continues to have minimal nasal congestion, uses flonase prn. Minimal wheeze, some occasional cough. He has to stop and rest sometimes. Able to shop. Often has to lean over his cough for rest. PNA shots are URD, will need pneumovax after age 11   No flowsheet data found.  PULMONARY FUNCTON TEST 08/30/2012  FVC 3.93  FEV1 1.69  FEV1/FVC 43  FVC  % Predicted 93  FEV % Predicted 55  FeF 25-75 .71  FeF 25-75 % Predicted 3.05    Review of Systems     Objective:   Physical Exam Vitals:   11/27/17 1107  BP: 126/74  Pulse: 78  SpO2: 92%  Weight: 165 lb 6.4 oz (75 kg)  Height: 5\' 6"  (1.676 m)   Gen: Pleasant, well-nourished, in no distress,  normal affect  ENT: No lesions,  mouth clear,  oropharynx clear, no postnasal drip  Neck: No JVD, no stridor  Lungs: No use of accessory muscles, distant but clear.  No wheezing today  Cardiovascular: RRR, heart sounds normal, no murmur or gallops, no peripheral edema  Musculoskeletal: No deformities, no cyanosis or clubbing  Neuro: alert, non focal  Skin: Warm, no lesions or rashes     Assessment & Plan:  COPD (chronic obstructive pulmonary disease) (H[facility]) Please continue Anoro once daily as you have been taking it. Use your albuterol nebulizer as scheduled. Keep albuterol inhaler available to use 2 puffs if needed for shortness of breath, cough, wheezing, chest tightness. Your pneumonia shots are  up-to-date.  You will need a booster of your Pneumovax after age 10. Get the flu shot this fall. Follow with Dr Lamonte Sakai in 6 months or sooner if you have any problems  Chronic respiratory failure with hypoxia, on home O2 therapy ([place]) Wear your oxygen at 3 to 4 L/min depending on your level of exertion.  Allergic rhinitis Keep Flonase available to use as needed for nasal congestion.  [name], MD, PhD 11/27/2017, 11:32 AM [facility] [phone] or if no answer [phone]

## 2017-12-01 DIAGNOSIS — J449 Chronic obstructive pulmonary disease, unspecified: Secondary | ICD-10-CM | POA: Diagnosis not present

## 2017-12-24 ENCOUNTER — Telehealth: Payer: Self-pay | Admitting: Emergency Medicine

## 2017-12-24 DIAGNOSIS — J9611 Chronic respiratory failure with hypoxia: Secondary | ICD-10-CM

## 2017-12-24 DIAGNOSIS — J449 Chronic obstructive pulmonary disease, unspecified: Secondary | ICD-10-CM

## 2017-12-24 DIAGNOSIS — Z9981 Dependence on supplemental oxygen: Secondary | ICD-10-CM

## 2017-12-24 NOTE — Telephone Encounter (Addendum)
Called and spoke with pt regarding in need of new nebulizer machine supplies with [facility]ME[facility] Placed order to [facility] for new neb meds Pt verbalized understanding, and had no further questions Nothing further needed.

## 2017-12-26 DIAGNOSIS — J449 Chronic obstructive pulmonary disease, unspecified: Secondary | ICD-10-CM | POA: Diagnosis not present

## 2018-01-01 DIAGNOSIS — J449 Chronic obstructive pulmonary disease, unspecified: Secondary | ICD-10-CM | POA: Diagnosis not present

## 2018-02-01 DIAGNOSIS — J449 Chronic obstructive pulmonary disease, unspecified: Secondary | ICD-10-CM | POA: Diagnosis not present

## 2018-02-10 DIAGNOSIS — J449 Chronic obstructive pulmonary disease, unspecified: Secondary | ICD-10-CM | POA: Diagnosis not present

## 2018-03-03 DIAGNOSIS — J449 Chronic obstructive pulmonary disease, unspecified: Secondary | ICD-10-CM | POA: Diagnosis not present

## 2018-03-14 ENCOUNTER — Other Ambulatory Visit: Payer: Self-pay | Admitting: Emergency Medicine

## 2018-03-18 ENCOUNTER — Telehealth: Payer: Self-pay | Admitting: Emergency Medicine

## 2018-03-18 NOTE — Telephone Encounter (Signed)
Attempted to call [name], Patient's sister.  Unable to leave message, because VM is full.  Will try at a later time.

## 2018-03-19 NOTE — Telephone Encounter (Signed)
Patient sister [name]) returned phone call; contact # [phone]

## 2018-03-19 NOTE — Telephone Encounter (Signed)
Attempted to contact pt's sister, [name]. I did not receive an answer. There was not an option for me to leave a message due to her voicemail being full. Will try back.

## 2018-03-19 NOTE — Telephone Encounter (Signed)
Called and spoke to patient sister [name], states she would like to switch DME companies from [facility] to [facility]. She also wanted to now if we can order a wheelchair for the patient, States he is increasingly having more difficulty ambulating without the oxygen.   RB please advise if okay to place order to switch dme companies. Also does patient need face to face for wheelchair order. Thanks.

## 2018-03-21 DIAGNOSIS — J449 Chronic obstructive pulmonary disease, unspecified: Secondary | ICD-10-CM | POA: Diagnosis not present

## 2018-03-22 NOTE — Telephone Encounter (Signed)
Called and spoke with pt's sister [name] letting her know that we were still waiting to hear from [facility] out the orders but I stated to her with pt coming close to pt having an OV that we could go ahead and schedule a visit for pt to get this taken care of at that visit.  [name] expressed understanding. Pt has been scheduled an OV with Lazaro Arms, NP Wednesday, 11/20 at 11am. Nothing further needed.

## 2018-03-27 ENCOUNTER — Other Ambulatory Visit: Payer: Self-pay | Admitting: Emergency Medicine

## 2018-03-27 ENCOUNTER — Encounter: Payer: Self-pay | Admitting: Nurse Practitioner

## 2018-03-27 ENCOUNTER — Ambulatory Visit: Payer: Medicare Other | Admitting: Nurse Practitioner

## 2018-03-27 VITALS — BP 130/70 | HR 80 | Ht 66.0 in | Wt 169.8 lb

## 2018-03-27 DIAGNOSIS — J438 Other emphysema: Secondary | ICD-10-CM

## 2018-03-27 DIAGNOSIS — Z9981 Dependence on supplemental oxygen: Secondary | ICD-10-CM

## 2018-03-27 DIAGNOSIS — J449 Chronic obstructive pulmonary disease, unspecified: Secondary | ICD-10-CM | POA: Diagnosis not present

## 2018-03-27 DIAGNOSIS — J9611 Chronic respiratory failure with hypoxia: Secondary | ICD-10-CM | POA: Diagnosis not present

## 2018-03-27 NOTE — Assessment & Plan Note (Signed)
Patient Instructions  Continue anoro daily Continue ventolin or albuterol neb every 6 hours as needed Stay active - will order pulmonary rehab  Continue 02 at 3-4 L [place] as needed per exertion level Follow up with [name] in 3 months or sooner if needed

## 2018-03-27 NOTE — Addendum Note (Signed)
Addended by: Nena Polio on: 03/27/2018 05:15 PM   Modules accepted: Orders

## 2018-03-27 NOTE — Progress Notes (Signed)
@Patient  ID: Frank [name], male    DOB: [birth year], 62 y.o.   MRN: [id number]  Chief Complaint  Patient presents with  . Follow-up    Referring provider: [name], MD  HPI 62 year old male former smoker with COPD and chronic hypoxemic respiratory failure followed by [name].  Tests: CXR 02/19/15 - Stable changes indicative of chronic obstructive pulmonary disease. No edema or consolidation. Cardiac silhouette within normal limits. No change from prior study.  OV 03/27/18 - follow up Patient presents for a follow up. She has not had to be in hospital or had any flares since last visit. States that he had his flu shot in October. Patient states that he gets short winded at times and wife is wondering about getting a wheelchair for when she is out with the patient walking long distances. At times she worries that he will get too weak and fall. His mood seems down today. He states it is because he hasn't had as much strength for activities of daily living lately. He doesn't want to go out to run errands anymore. Stays at home more now. He has lost interest in going out and doing things. He feels like he is getting more out of shape or deconditioned lately. He is compliant with his medications. He continues O2 at 3 L [place]. He denies any chest pain, fever, or edema.   No Known Allergies  Immunization History  Administered Date(s) Administered  . Influenza Split 06/07/2012, 02/05/2017  . Influenza, Quadrivalent, Recombinant, Inj, Pf 02/05/2018  . Influenza,inj,Quad PF,6+ Mos 01/29/2013, 02/04/2016, 03/07/2016  . Influenza-Unspecified 01/19/2015  . Pneumococcal Conjugate-13 11/13/2013  . Pneumococcal Polysaccharide-23 06/07/2012  . Tdap 06/18/2012    Past Medical History:  Diagnosis Date  . COPD (chronic obstructive pulmonary disease) ([place]) 06/05/2012  . Dyspnea   . Dysrhythmia   . Emphysema 06/07/2012  . Heart murmur    hx of as baby  . History of kidney stones   .  Hypoxemia 06/07/2012  . Obstructive chronic bronchitis with exacerbation ([place]) 06/07/2012  . Pneumonia   . Tobacco abuse 06/05/2012    Tobacco History: Social History   Tobacco Use  Smoking Status Former Smoker  . Packs/day: 0.50  . Years: 35.00  . Pack years: 17.50  . Types: Cigarettes  . Last attempt to quit: 05/11/2015  . Years since quitting: 2.8  Smokeless Tobacco Former Systems developer  . Types: Chew  . Quit date: 05/08/1968   Counseling given: Yes   Outpatient Encounter Medications as of 03/27/2018  Medication Sig  . albuterol (ACCUNEB) 0.63 MG/3ML nebulizer solution USE 1 VIAL IN NEBULIZER EVERY 6 HOURS  . ANORO ELLIPTA 62.5-25 MCG/INH AEPB TAKE 1 INHALATION BY MOUTH INTO THE LUNGS EVERY DAY  . fluticasone (FLONASE) 50 MCG/ACT nasal spray SHAKE LIQUID WELL AND USE 2 SPRAYS IN EACH NOSTRIL DAILY  . OXYGEN Inhale 3 L into the lungs continuous.   . VENTOLIN HFA 108 (90 Base) MCG/ACT inhaler INHALE 2 PUFFS BY MOUTH EVERY 4 HOURS AS NEEDED FOR WHEEZING OR SHORTNESS OF BREATH   No facility-administered encounter medications on file as of 03/27/2018.      Review of Systems  Review of Systems  Constitutional: Negative.  Negative for chills and fever.  HENT: Negative.  Negative for congestion.   Respiratory: Positive for shortness of breath. Negative for cough and wheezing.   Cardiovascular: Negative.  Negative for chest pain, palpitations and leg swelling.  Gastrointestinal: Negative.   Allergic/Immunologic: Negative.  Neurological: Negative.   Psychiatric/Behavioral: Negative.        Physical Exam  BP 130/70 (BP Location: Left Arm, Patient Position: Sitting, Cuff Size: Normal)   Pulse 80   Ht 5\' 6"  (1.676 m)   Wt 169 lb 12.8 oz (77 kg)   SpO2 99% Comment: on 2L of O2 pulse  BMI 27.41 kg/m   Wt Readings from Last 5 Encounters:  03/27/18 169 lb 12.8 oz (77 kg)  11/27/17 165 lb 6.4 oz (75 kg)  05/30/17 165 lb (74.8 kg)  11/27/16 162 lb 9.6 oz (73.8 kg)  10/23/16 162 lb  9.6 oz (73.8 kg)     Physical Exam  Constitutional: He is oriented to person, place, and time. He appears well-developed and well-nourished. No distress.  Cardiovascular: Normal rate and regular rhythm.  Pulmonary/Chest: Effort normal and breath sounds normal. No respiratory distress. He has no wheezes.  Musculoskeletal: He exhibits no edema.  Neurological: He is alert and oriented to person, place, and time.  Skin: Skin is warm and dry.  Psychiatric: He has a normal mood and affect.  Nursing note and vitals reviewed.    Assessment & Plan:   COPD (chronic obstructive pulmonary disease) ([place]) Discussion: Patient has become more deconditioned over the past couple of months Will try to order pulmonary rehab  Patient Instructions  Continue anoro daily Continue ventolin or albuterol neb every 6 hours as needed Stay active - will order pulmonary rehab  Continue 02 at 3-4 L [place] as needed per exertion level Follow up with [name] in 3 months or sooner if needed     Chronic respiratory failure with hypoxia, on home O2 therapy [facility]) Patient Instructions  Continue anoro daily Continue ventolin or albuterol neb every 6 hours as needed Stay active - will order pulmonary rehab  Continue 02 at 3-4 L  as needed per exertion level Follow up with [name] in 3 months or sooner if needed        Fenton Foy, NP 03/27/2018

## 2018-03-27 NOTE — Patient Instructions (Signed)
Continue anoro daily Continue ventolin or albuterol neb every 6 hours as needed Stay active - will order pulmonary rehab  Continue 02 at 3-4 L [place] as needed per exertion level Follow up with [name] in 3 months or sooner if needed

## 2018-03-27 NOTE — Assessment & Plan Note (Addendum)
Discussion: Patient has become more deconditioned over the past couple of months Will try to order pulmonary rehab  Patient Instructions  Continue anoro daily Continue ventolin or albuterol neb every 6 hours as needed Stay active - will order pulmonary rehab  Continue 02 at 3-4 L [place] as needed per exertion level Follow up with [name] in 3 months or sooner if needed

## 2018-03-28 ENCOUNTER — Telehealth (HOSPITAL_COMMUNITY): Payer: Self-pay

## 2018-03-28 NOTE — Telephone Encounter (Signed)
Called and spoke with patient in regards to Pulmonary Rehab - pt stated he will need to think on it as of right now due to finances. Will call patient to follow up if no call from patient next week.

## 2018-03-28 NOTE — Addendum Note (Signed)
Addended by: Nena Polio on: 03/28/2018 10:20 AM   Modules accepted: Orders

## 2018-03-28 NOTE — Telephone Encounter (Signed)
Pt's insurance is active and benefits verified through [employer].  No Co-pay, deductible amount of $0.00/$0.00, out of pocket amount $6,700/$476.98, 20% co-insurance, and no pre-authorization is required. Spoke with Gen C from [place] 03/28/18.

## 2018-03-28 NOTE — Addendum Note (Signed)
Addended by: Nena Polio on: 03/28/2018 01:55 PM   Modules accepted: Orders

## 2018-04-02 ENCOUNTER — Telehealth (HOSPITAL_COMMUNITY): Payer: Self-pay

## 2018-04-02 NOTE — Telephone Encounter (Signed)
Called patient to see if he was interested in participating in the Pulmonary Rehab Program. Patient stated not at this time due to his finance issues. I did adv pt we do have a PR maintenance program. Pt still decline.  Closed referral

## 2018-04-03 DIAGNOSIS — J449 Chronic obstructive pulmonary disease, unspecified: Secondary | ICD-10-CM | POA: Diagnosis not present

## 2018-04-24 ENCOUNTER — Other Ambulatory Visit: Payer: Self-pay | Admitting: Emergency Medicine

## 2018-04-25 ENCOUNTER — Other Ambulatory Visit: Payer: Self-pay | Admitting: Emergency Medicine

## 2018-04-26 DIAGNOSIS — J449 Chronic obstructive pulmonary disease, unspecified: Secondary | ICD-10-CM | POA: Diagnosis not present

## 2018-05-03 DIAGNOSIS — J449 Chronic obstructive pulmonary disease, unspecified: Secondary | ICD-10-CM | POA: Diagnosis not present

## 2018-06-03 DIAGNOSIS — J449 Chronic obstructive pulmonary disease, unspecified: Secondary | ICD-10-CM | POA: Diagnosis not present

## 2018-06-24 ENCOUNTER — Telehealth: Payer: Self-pay | Admitting: Emergency Medicine

## 2018-06-24 NOTE — Telephone Encounter (Signed)
Call made to patient, patient states he was doing his taxes and his tax company told him if he can get a letter stating he was disabled they would be able to get him a tax cut on his home. Patient states he is aware he has an appt this Friday and would like to have the letter ready by then. I informed the patient I would get this message to RB and we could address at [place]. Voiced understanding. Will route message to RB as FYI. Nothing further is needed at this time.

## 2018-06-28 ENCOUNTER — Ambulatory Visit: Payer: Medicare Other | Admitting: Emergency Medicine

## 2018-06-28 ENCOUNTER — Encounter: Payer: Self-pay | Admitting: Emergency Medicine

## 2018-06-28 ENCOUNTER — Encounter: Payer: Self-pay | Admitting: Internal Medicine

## 2018-06-28 ENCOUNTER — Encounter: Payer: Self-pay | Admitting: *Deleted

## 2018-06-28 DIAGNOSIS — J9611 Chronic respiratory failure with hypoxia: Secondary | ICD-10-CM

## 2018-06-28 DIAGNOSIS — J301 Allergic rhinitis due to pollen: Secondary | ICD-10-CM

## 2018-06-28 DIAGNOSIS — Z9981 Dependence on supplemental oxygen: Secondary | ICD-10-CM | POA: Diagnosis not present

## 2018-06-28 DIAGNOSIS — J449 Chronic obstructive pulmonary disease, unspecified: Secondary | ICD-10-CM

## 2018-06-28 NOTE — Patient Instructions (Signed)
Please continue Anoro once daily. Keep your albuterol available to use either 2 puffs or 1 nebulizer treatment up to every 4 hours if needed for shortness of breath, chest tightness, wheezing. We will write a letter today to confirm that you have qualified for disability due to your lung disease. Continue your oxygen at 3 to 4 L/min at all times. Your flu shot is up-to-date You will be due for the pneumonia vaccine after age 65 Continue fluticasone nasal spray 2 sprays each nostril daily if needed for nasal congestion and allergy symptoms. Follow with Dr Lamonte Sakai in 6 months or sooner if you have any problems

## 2018-06-28 NOTE — Progress Notes (Signed)
Subjective:    Patient ID: Frank [name], male    DOB: 04/13/1956, 63 y.o.   MRN: [id number]  COPD  His past medical history is significant for COPD.   63 yo former smoker with COPD, chronic hypoxic respiratory failure, allergic rhinitis.  ROV 11/27/17 --patient has a history of severe COPD with associated chronic hypoxemic respiratory failure.  He uses 3 L/min with exertion.  He reports that since our last visit he has been stable. He does turn his O2 up to 4L/min with heavy exertion. He is on Anoro, has been doing well. Uses albuterol tid via nebulizer. No flares since last time. He continues to have minimal nasal congestion, uses flonase prn. Minimal wheeze, some occasional cough. He has to stop and rest sometimes. Able to shop. Often has to lean over his cough for rest. PNA shots are URD, will need pneumovax after age 67  ROV 06/28/2018 --Frank [name] is 77, has a history of severe COPD FEV1 1.69 (55% predicted) in 08/2012.  He has associated chronic hypoxemic respiratory failure on 3 to 4 L/min.  He was last seen here in November, had a decreased functional capacity, was able to do fewer ADLs, had a somewhat depressed affect.  He was continued on Anoro and pulmonary rehab referral was made -he did not attend due to financial barriers.  He has had some URI sx, nasal congestion beginning about a week ago, increased cough - non-productive. Took OTC meds, including mucinex. His breathing did worsen during this time. He has needed to stop to rest w walking, doing chores at home. He wear his O2 reliably inside, but often forgets when he gets busy or is outside. He is reliable with Anoro. He is on disability, does not work.  Flu shot up to date. Needs pneumonia shot after age 9.  Uses flonase prn. Albuterol HFA use about 0-1x a day, albuterol nebs TID on a schedule.     PULMONARY FUNCTON TEST 08/30/2012  FVC 3.93  FEV1 1.69  FEV1/FVC 43  FVC  % Predicted 93  FEV % Predicted 55  FeF 25-75 .71  FeF  25-75 % Predicted 3.05    Review of Systems     Objective:   Physical Exam Vitals:   06/28/18 1100  BP: 130/74  Pulse: 85  SpO2: 96%  Weight: 167 lb 6.4 oz (75.9 kg)  Height: 5\' 4"  (1.626 m)   Gen: Pleasant, well-nourished, in no distress,  normal affect  ENT: No lesions,  mouth clear,  oropharynx clear, no postnasal drip  Neck: No JVD, no stridor  Lungs: No use of accessory muscles, distant, some soft exp wheeze on the R, clear oin the L  Cardiovascular: RRR, heart sounds normal, no murmur or gallops, no peripheral edema  Musculoskeletal: No deformities, no cyanosis or clubbing  Neuro: alert, non focal  Skin: Warm, no lesions or rashes     Assessment & Plan:  COPD (chronic obstructive pulmonary disease) ([place]) Frank [name] is overall stable.  He has gotten through a recent URI without an acute exacerbation, appears to be improving.  He still has exertional dyspnea, some difficulty with his ADL.  He was unable to go to pulmonary rehab because of cost barriers.  We may be able to revisit this in the future.  He is up-to-date on his flu shot, needs his next pneumonia vaccine after age 70.  He is tolerating Anoro and benefits from it although he is using albuterol nebs on a schedule.  We  will continue this regimen.  He is on disability for his pulmonary disease and chronic respiratory failure.  I will write a letter today to document this so that he can discuss it with his CPA.  Chronic respiratory failure with hypoxia, on home O2 therapy (HCC) On oxygen at 3 to 4 L/min.  He does acknowledge that occasionally he ambulates outside without the oxygen on.  When he does so he is more dyspneic.  Fairly good compliance.  Continue same.  Allergic rhinitis Using fluticasone nasal spray as needed.  Adequate control.  Continue same  [name], MD, PhD 06/28/2018, 11:24 AM [facility] [phone] or if no answer [phone]

## 2018-06-28 NOTE — Assessment & Plan Note (Signed)
On oxygen at 3 to 4 L/min.  He does acknowledge that occasionally he ambulates outside without the oxygen on.  When he does so he is more dyspneic.  Fairly good compliance.  Continue same.

## 2018-06-28 NOTE — Assessment & Plan Note (Signed)
Frank [name] is overall stable.  He has gotten through a recent URI without an acute exacerbation, appears to be improving.  He still has exertional dyspnea, some difficulty with his ADL.  He was unable to go to pulmonary rehab because of cost barriers.  We may be able to revisit this in the future.  He is up-to-date on his flu shot, needs his next pneumonia vaccine after age 63.  He is tolerating Anoro and benefits from it although he is using albuterol nebs on a schedule.  We will continue this regimen.  He is on disability for his pulmonary disease and chronic respiratory failure.  I will write a letter today to document this so that he can discuss it with his CPA.

## 2018-06-28 NOTE — Telephone Encounter (Signed)
Done

## 2018-06-28 NOTE — Assessment & Plan Note (Signed)
Using fluticasone nasal spray as needed.  Adequate control.  Continue same

## 2018-07-04 DIAGNOSIS — J449 Chronic obstructive pulmonary disease, unspecified: Secondary | ICD-10-CM | POA: Diagnosis not present

## 2018-07-17 ENCOUNTER — Other Ambulatory Visit: Payer: Self-pay | Admitting: Emergency Medicine

## 2018-07-22 DIAGNOSIS — J449 Chronic obstructive pulmonary disease, unspecified: Secondary | ICD-10-CM | POA: Diagnosis not present

## 2018-08-02 ENCOUNTER — Other Ambulatory Visit: Payer: Self-pay

## 2018-08-02 ENCOUNTER — Encounter: Payer: Medicare Other | Admitting: Internal Medicine

## 2018-08-02 DIAGNOSIS — J449 Chronic obstructive pulmonary disease, unspecified: Secondary | ICD-10-CM | POA: Diagnosis not present

## 2018-08-22 ENCOUNTER — Telehealth: Payer: Self-pay | Admitting: Emergency Medicine

## 2018-08-22 NOTE — Telephone Encounter (Signed)
Verified paperwork has been placed in RB's box by Patrice. Will route message to [place] for f/u.

## 2018-08-23 NOTE — Telephone Encounter (Signed)
Yes I can complete, please fill out as much as possible and I will finish

## 2018-08-23 NOTE — Telephone Encounter (Signed)
Form has been filled out and signed by [employer]. Pt is aware that I will be mailing this back to him today. Form has been placed in the outgoing mail. Nothing further was needed.

## 2018-08-23 NOTE — Telephone Encounter (Signed)
Beth would you be willing to fill this form out as RB will not be back in the office any time soon? Thanks.

## 2018-09-02 DIAGNOSIS — J449 Chronic obstructive pulmonary disease, unspecified: Secondary | ICD-10-CM | POA: Diagnosis not present

## 2018-09-03 ENCOUNTER — Other Ambulatory Visit: Payer: Self-pay | Admitting: Emergency Medicine

## 2018-09-04 DIAGNOSIS — J449 Chronic obstructive pulmonary disease, unspecified: Secondary | ICD-10-CM | POA: Diagnosis not present

## 2018-10-02 DIAGNOSIS — J449 Chronic obstructive pulmonary disease, unspecified: Secondary | ICD-10-CM | POA: Diagnosis not present

## 2018-10-04 DIAGNOSIS — J449 Chronic obstructive pulmonary disease, unspecified: Secondary | ICD-10-CM | POA: Diagnosis not present

## 2018-11-02 DIAGNOSIS — J449 Chronic obstructive pulmonary disease, unspecified: Secondary | ICD-10-CM | POA: Diagnosis not present

## 2018-11-09 ENCOUNTER — Other Ambulatory Visit: Payer: Self-pay | Admitting: Emergency Medicine

## 2018-11-11 ENCOUNTER — Other Ambulatory Visit: Payer: Self-pay | Admitting: Emergency Medicine

## 2018-11-25 DIAGNOSIS — J449 Chronic obstructive pulmonary disease, unspecified: Secondary | ICD-10-CM | POA: Diagnosis not present

## 2018-11-28 ENCOUNTER — Telehealth: Payer: Self-pay | Admitting: Emergency Medicine

## 2018-11-28 NOTE — Telephone Encounter (Signed)
Call made to patient, made aware we have received forms for his disability. He states he would rather have RB be the only one to sign. I made him aware RB will be back in the office Monday and we will have him address at that time. He voiced he is fine waiting. He states form has to be signed by a Physician and since RB is familiar with him he prefers to wait on him. Will route message to his nurse to hold until Monday as she will be back Monday as well.

## 2018-12-02 NOTE — Telephone Encounter (Signed)
Done 12/02/2018

## 2018-12-02 NOTE — Telephone Encounter (Signed)
Form has been filled out and placed in RB's sign folder.

## 2019-01-03 ENCOUNTER — Other Ambulatory Visit: Payer: Self-pay | Admitting: Emergency Medicine

## 2019-01-10 DIAGNOSIS — J449 Chronic obstructive pulmonary disease, unspecified: Secondary | ICD-10-CM | POA: Diagnosis not present

## 2019-01-24 ENCOUNTER — Encounter: Payer: Self-pay | Admitting: Internal Medicine

## 2019-02-08 DIAGNOSIS — J449 Chronic obstructive pulmonary disease, unspecified: Secondary | ICD-10-CM | POA: Diagnosis not present

## 2019-03-18 DIAGNOSIS — Z23 Encounter for immunization: Secondary | ICD-10-CM | POA: Diagnosis not present

## 2019-04-02 DIAGNOSIS — J449 Chronic obstructive pulmonary disease, unspecified: Secondary | ICD-10-CM | POA: Diagnosis not present

## 2019-04-22 ENCOUNTER — Telehealth: Payer: Self-pay | Admitting: Emergency Medicine

## 2019-04-22 NOTE — Telephone Encounter (Signed)
Attempted to call pt but unable to reach. Left message for pt to return call.  Due to pt not being seen since Feb 2020 and having increased SOB, pt needs to be scheduled for a televisit.  When pt calls back please make a televisit appt for pt.

## 2019-04-23 ENCOUNTER — Ambulatory Visit (INDEPENDENT_AMBULATORY_CARE_PROVIDER_SITE_OTHER): Payer: Medicare Other | Admitting: Primary Care

## 2019-04-23 ENCOUNTER — Other Ambulatory Visit: Payer: Self-pay

## 2019-04-23 DIAGNOSIS — J441 Chronic obstructive pulmonary disease with (acute) exacerbation: Secondary | ICD-10-CM

## 2019-04-23 DIAGNOSIS — Z9981 Dependence on supplemental oxygen: Secondary | ICD-10-CM

## 2019-04-23 MED ORDER — PREDNISONE 10 MG PO TABS: ORAL_TABLET | ORAL | 0 refills | Status: DC

## 2019-04-23 MED ORDER — GUAIFENESIN ER 600 MG PO TB12: 600.0000 mg | ORAL_TABLET | Freq: Two times a day (BID) | ORAL | 0 refills | Status: DC

## 2019-04-23 NOTE — Telephone Encounter (Signed)
Called and spoke to patient. Patient stated he is having increased shortness of breath with activity, a dry cough, and sinus congestion. Patient reports not feeling well overall. Patient agreed to do a video visit and would like the link to be sent by text to [phone]. Patient scheduled with NP for this afternoon. Nothing further needed at this time.

## 2019-04-23 NOTE — Progress Notes (Signed)
Virtual Visit via Telephone Note  I connected with Frank [name] on 04/23/19 at  3:00 PM EST by telephone and verified that I am speaking with the correct person using two identifiers.  Location: Patient: Home Provider: Office   I discussed the limitations, risks, security and privacy concerns of performing an evaluation and management service by telephone and the availability of in person appointments. I also discussed with the patient that there may be a patient responsible charge related to this service. The patient expressed understanding and agreed to proceed.   History of Present Illness: 63 year old male, former smoker. PMH significant for COPD, allergic rhinitis, chronic respiratory failure with hypoxia. Patient of [name], last seen on 06/28/18.   04/23/2019 Patient contacted today for acute televisit with reports of increased sob, dry cough and sinus congestion x2-3 days. Cough is dry with no production. He is getting up clear nasal mucus. Associated chest tightness with breathing. No new oxygen requirements, on baseline 3-4L. Continues to use Anoro once daily as prescribed and Albuterol nebulizer three times a day scheduled. He recently has been needing to use his rescue inhaler 2-3 times a day in addition. He received his flu vaccine last month. No known sick contacts. Denies fever, chills, sweats, N/V/D.   Observations/Objective:  -No shortness of breath, wheezing or cough noted during phone conversation  Assessment and Plan:  COPD exacerbation - RX prednisone taper (40mg  x 3 days, 30mg  x 3 days, 20mg  x 3 days, 10mg  x 3 days) - No indication for antibiotic at this time (if not better in 2-5 days consider) - Recommend taking mucinex 600mg  twice daily   Follow Up Instructions:  - Call if symptoms do not improve or worsen    I discussed the assessment and treatment plan with the patient. The patient was provided an opportunity to ask questions and all were answered. The  patient agreed with the plan and demonstrated an understanding of the instructions.   The patient was advised to call back or seek an in-person evaluation if the symptoms worsen or if the condition fails to improve as anticipated.  I provided 18 minutes of non-face-to-face time during this encounter.   Martyn Ehrich, NP

## 2019-04-27 ENCOUNTER — Encounter: Payer: Self-pay | Admitting: Primary Care

## 2019-04-27 NOTE — Patient Instructions (Signed)
Prednisone taper as prescribed for COPD exacerbation Take mucinex 600mg  twice daily for congestion  No antibiotic at this time, call if not better in 2-5 days

## 2019-05-10 DIAGNOSIS — J449 Chronic obstructive pulmonary disease, unspecified: Secondary | ICD-10-CM | POA: Diagnosis not present

## 2019-05-15 ENCOUNTER — Telehealth: Payer: Self-pay | Admitting: Emergency Medicine

## 2019-05-15 NOTE — Telephone Encounter (Signed)
Called and spoke with pt letting him know that we needed to get him scheduled for a televisit to reevaluate. Pt verbalized understanding and has been scheduled for televisit with [facility] 1/8. Nothing further needed.

## 2019-05-15 NOTE — Telephone Encounter (Signed)
Pt c/o nonprod cough, chest congestion without mucus production, chest tightness. Pt had a televisit wity Beth on 12/16 and was given a pred taper, states he felt some better when taking the prednisone but s/s have worsened since completing pred taper.    Pt is taking Mucinex and Albuterol to help with s/s.  Requesting additional recs.   Pharmacy: [facility] on [address] to APP of the day since he is RB pt.  TP please advise.  Thanks!

## 2019-05-15 NOTE — Telephone Encounter (Signed)
Will need to be seen again to reevaluate. Can put on for televisit for [facility] for tomorrow .   If worse will need to go to ER .  Please contact office for sooner follow up if symptoms do not improve or worsen or seek emergency care

## 2019-05-16 ENCOUNTER — Other Ambulatory Visit: Payer: Self-pay

## 2019-05-16 ENCOUNTER — Ambulatory Visit (INDEPENDENT_AMBULATORY_CARE_PROVIDER_SITE_OTHER): Payer: Medicare Other | Admitting: Primary Care

## 2019-05-16 DIAGNOSIS — J441 Chronic obstructive pulmonary disease with (acute) exacerbation: Secondary | ICD-10-CM

## 2019-05-16 DIAGNOSIS — J0100 Acute maxillary sinusitis, unspecified: Secondary | ICD-10-CM | POA: Diagnosis not present

## 2019-05-16 MED ORDER — PREDNISONE 10 MG PO TABS: ORAL_TABLET | ORAL | 0 refills | Status: DC

## 2019-05-16 MED ORDER — AMOXICILLIN-POT CLAVULANATE 875-125 MG PO TABS: 1.0000 | ORAL_TABLET | Freq: Two times a day (BID) | ORAL | 0 refills | Status: DC

## 2019-05-16 NOTE — Progress Notes (Signed)
Virtual Visit via Telephone Note  I connected with Frank [name] on 05/16/19 at  9:30 AM EST by telephone and verified that I am speaking with the correct person using two identifiers.  Location: Patient: Home   Provider: Home office    I discussed the limitations, risks, security and privacy concerns of performing an evaluation and management service by telephone and the availability of in person appointments. I also discussed with the patient that there may be a patient responsible charge related to this service. The patient expressed understanding and agreed to proceed.   History of Present Illness: 64 year old male, former smoker. PMH significant for COPD, allergic rhinitis, chronic respiratory failure with hypoxia. Patient of [name]. Maintained on Anoro, prn albuterol hfa/neb.   Previous [facility] pulmonary encounter: 04/23/2019 Patient contacted today for acute televisit with reports of increased sob, dry cough and sinus congestion x2-3 days. Cough is dry with no production. He is getting up clear nasal mucus. Associated chest tightness with breathing. No new oxygen requirements, on baseline 3-4L. Continues to use Anoro once daily as prescribed and Albuterol nebulizer three times a day scheduled. He recently has been needing to use his rescue inhaler 2-3 times a day in addition. He received his flu vaccine last month. No known sick contacts. Denies fever, chills, sweats, N/V/D.   05/16/2019 Patient contacted today for televisit. Acute complaints of head/nasal congestion and nonproductive cough x2-4 weeks. He felt some better after taking prednisone taper on 12/16 but symptoms have worsened since completing. He is taking mucinex and using his albuterol hfa. O2 level is sustaining 93-94% 3L at rest but will drop to 89-91% 3L on exertion or with coughing. He has turned his oxygen up to 4L. No known sick contacts. Denies fever, chills, sweats, HA, chest pain.   Observations/Objective:  - Sound  well, able to speak in full sentences  - No significant shortness of breath, wheezing or cough noted during phone conversation  Assessment and Plan:  Acute sinusitis/ COPD exacerbation  - RX Augmentin 1 tab twice daily x 7 days - Extending prednisone taper (40mg  x 2 days; 30mg  x 2 days; 20mg  x 2 days; 10mg  x 2 days) - Continue Anoro 1 puff daily; albuterol rescue inhaler/nebulizer every 6 hours for breakthrough shortness of breath/wheezing - Continue Mucinex twice daily - Add Flonase nasal spray and ocean nasal spray  - Recommend ED evaluation if O2 levels sustaining <88% on oxygen   Follow Up Instructions:   - Call office in 1 week if not improving or symptoms worsen  I discussed the assessment and treatment plan with the patient. The patient was provided an opportunity to ask questions and all were answered. The patient agreed with the plan and demonstrated an understanding of the instructions.   The patient was advised to call back or seek an in-person evaluation if the symptoms worsen or if the condition fails to improve as anticipated.  I provided 18 minutes of non-face-to-face time during this encounter.   Martyn Ehrich, NP

## 2019-05-16 NOTE — Patient Instructions (Signed)
  Acute sinusitis/ COPD exacerbation  - RX Augmentin 1 tab twice daily x 7 days - Extending prednisone taper (40mg  x 2 days; 30mg  x 2 days; 20mg  x 2 days; 10mg  x 2 days) - Continue Anoro 1 puff daily; albuterol rescue inhaler/nebulizer every 6 hours for breakthrough shortness of breath/wheezing - Continue Mucinex twice daily - Add Flonase nasal spray and ocean nasal spray  - Recommend ED evaluation if O2 levels sustaining <88% on oxygen   Follow-up: Call office if not better in 1 week or symptoms worsen

## 2019-06-07 DIAGNOSIS — J449 Chronic obstructive pulmonary disease, unspecified: Secondary | ICD-10-CM | POA: Diagnosis not present

## 2019-06-19 ENCOUNTER — Encounter: Payer: Medicare Other | Admitting: Internal Medicine

## 2019-06-19 ENCOUNTER — Ambulatory Visit (INDEPENDENT_AMBULATORY_CARE_PROVIDER_SITE_OTHER): Payer: Medicare Other | Admitting: Emergency Medicine

## 2019-06-19 ENCOUNTER — Encounter: Payer: Self-pay | Admitting: Emergency Medicine

## 2019-06-19 ENCOUNTER — Telehealth: Payer: Self-pay | Admitting: Emergency Medicine

## 2019-06-19 DIAGNOSIS — J449 Chronic obstructive pulmonary disease, unspecified: Secondary | ICD-10-CM | POA: Diagnosis not present

## 2019-06-19 NOTE — Telephone Encounter (Signed)
Called and spoke with pt's sister [name]. Stated to her that we should schedule a televisit for pt with RB to further address and she verbalized understanding. RB had an opening this afternoon at 4pm so I have scheduled pt a televisit with RB at that time. Nothing further needed.

## 2019-06-19 NOTE — Progress Notes (Signed)
Virtual Visit via Telephone Note  I connected with Frank [name] on 06/19/19 at  4:00 PM EST by telephone and verified that I am speaking with the correct person using two identifiers.  Location: Patient: Home Provider: Office   I discussed the limitations, risks, security and privacy concerns of performing an evaluation and management service by telephone and the availability of in person appointments. I also discussed with the patient that there may be a patient responsible charge related to this service. The patient expressed understanding and agreed to proceed.   History of Present Illness: 64 year old man with severe COPD and associated chronic hypoxemic respiratory failure on 3 to 4 L/min.  We have been managing him on.  He has chronic rhinitis, was treated for possible acute sinusitis with Augmentin and prednisone about 1 month ago.   Observations/Objective: He reports that he has anorexia, poor appetite for about 2 weeks. He uses 3-4L/min, no longer having overt desats since he was treated last month. His SOB is better. Poor sleep, difficulty falling asleep, goes to bed at 0100, then has to nap in the middle of the day. Uses Anoro qd, uses ventolin 1-2x daily. He is planning to follow with his PCP regarding his appetite next week. No wt loss. Flu shot up to date. He is waiting to get his COVID vaccine. He doesn't   Assessment and Plan: Severe COPD.  Plan to continue Anoro, albuterol as needed.  Flu shot up-to-date and he is planning to get his Covid shot when available.  Chronic hypoxemic respiratory failure.  No reported desaturations since he was treated for his acute exacerbation 1 month ago.  Continue his 3 to 4 L/min  Poor appetite without wt loss.  Poor sleep as well.  Question whether he may be experiencing depression.  Need to also consider other organic issues.  He is at risk for lung cancer needs chest x-ray  Follow Up Instructions:   3 months with a CXR   I discussed  the assessment and treatment plan with the patient. The patient was provided an opportunity to ask questions and all were answered. The patient agreed with the plan and demonstrated an understanding of the instructions.   The patient was advised to call back or seek an in-person evaluation if the symptoms worsen or if the condition fails to improve as anticipated.  I provided 15 minutes of non-face-to-face time during this encounter.   Collene Gobble, MD

## 2019-06-25 ENCOUNTER — Other Ambulatory Visit: Payer: Self-pay | Admitting: *Deleted

## 2019-06-25 MED ORDER — ANORO ELLIPTA 62.5-25 MCG/INH IN AEPB: INHALATION_SPRAY | RESPIRATORY_TRACT | 12 refills | Status: DC

## 2019-06-26 ENCOUNTER — Encounter: Payer: Medicare Other | Admitting: Internal Medicine

## 2019-07-03 ENCOUNTER — Telehealth: Payer: Self-pay | Admitting: Emergency Medicine

## 2019-07-03 DIAGNOSIS — J449 Chronic obstructive pulmonary disease, unspecified: Secondary | ICD-10-CM | POA: Diagnosis not present

## 2019-07-03 NOTE — Telephone Encounter (Signed)
Pt returning call.  [phone] °

## 2019-07-03 NOTE — Telephone Encounter (Signed)
Spoke with patient. He stated that his current nebulizer machine has stopped working and he needs a new one. He is established with [employer]. Spoke with Aaron Edelman about patient since RB is not in office. He is ok with signing the order.   Advised patient that I would place the order today. He verbalized understanding. Nothing further needed at time of call.

## 2019-07-03 NOTE — Telephone Encounter (Signed)
Called the pt and he did not answer-LMTCB

## 2019-07-08 ENCOUNTER — Encounter: Payer: Self-pay | Admitting: Internal Medicine

## 2019-07-08 ENCOUNTER — Ambulatory Visit (INDEPENDENT_AMBULATORY_CARE_PROVIDER_SITE_OTHER): Payer: Medicare Other | Admitting: Internal Medicine

## 2019-07-08 ENCOUNTER — Ambulatory Visit (HOSPITAL_COMMUNITY)
Admission: RE | Admit: 2019-07-08 | Discharge: 2019-07-08 | Disposition: A | Discharge status: Home / Self Care | Payer: Medicare Other | Source: Ambulatory Visit | Attending: Internal Medicine | Admitting: Internal Medicine

## 2019-07-08 ENCOUNTER — Other Ambulatory Visit: Payer: Self-pay

## 2019-07-08 VITALS — BP 138/78 | HR 75 | Temp 98.4°F | Wt 168.4 lb

## 2019-07-08 DIAGNOSIS — Z79899 Other long term (current) drug therapy: Secondary | ICD-10-CM

## 2019-07-08 DIAGNOSIS — K5730 Diverticulosis of large intestine without perforation or abscess without bleeding: Secondary | ICD-10-CM

## 2019-07-08 DIAGNOSIS — R0602 Shortness of breath: Secondary | ICD-10-CM | POA: Diagnosis not present

## 2019-07-08 DIAGNOSIS — J449 Chronic obstructive pulmonary disease, unspecified: Secondary | ICD-10-CM | POA: Diagnosis not present

## 2019-07-08 DIAGNOSIS — R630 Anorexia: Secondary | ICD-10-CM | POA: Diagnosis not present

## 2019-07-08 DIAGNOSIS — Z7951 Long term (current) use of inhaled steroids: Secondary | ICD-10-CM

## 2019-07-08 DIAGNOSIS — Z6828 Body mass index (BMI) 28.0-28.9, adult: Secondary | ICD-10-CM

## 2019-07-08 DIAGNOSIS — Z23 Encounter for immunization: Secondary | ICD-10-CM

## 2019-07-08 DIAGNOSIS — Z9981 Dependence on supplemental oxygen: Secondary | ICD-10-CM

## 2019-07-08 DIAGNOSIS — J9611 Chronic respiratory failure with hypoxia: Secondary | ICD-10-CM

## 2019-07-08 DIAGNOSIS — Z0000 Encounter for general adult medical examination without abnormal findings: Secondary | ICD-10-CM | POA: Insufficient documentation

## 2019-07-08 DIAGNOSIS — N400 Enlarged prostate without lower urinary tract symptoms: Secondary | ICD-10-CM | POA: Diagnosis not present

## 2019-07-08 NOTE — Assessment & Plan Note (Signed)
The patient is currently taking umeclidinium-vilanterol, albuterol neb q6hrs and inhaler prn. He also takes 3L [place] when he is at rest and 4L with exertion.   The patient states that he is requiring 3 additional doses of the albuterol inhaler daily. He feels worn down and tired frequently.   Assessment and plan  The patient may need a step up in his copd management with ICS added on. Per [name] the patient should get chest xray. I ordered a chest 2 view to be done today while he is here. Will discuss results with [name].

## 2019-07-08 NOTE — Assessment & Plan Note (Signed)
The patient states that he has been having decreased appetite for the past 1-2 months now. He states that he has been having approximately 2 meals daily. No changes in his weight. His sister is concerned that the patient may have depression. Patient states that he had depression one time prior 20 years ago at which time he felt similarly.   The patient states that he has been Decreased levels of concentration, anhedonia, decreased appetite.  Suicidal or homicidal ideation.  Assessment and plan The patient symptoms have only occurred for the past 1 to 2 months for start with an integrated behavioral health counseling session and then reassess for pharmaceutical needs.  Will check BMP and CBC visit has not been checked since 2018 patient's decreased appetite may cause electrolyte changes possible renal failure.  Have also placed a order for nutrition.

## 2019-07-08 NOTE — Patient Instructions (Addendum)
It was a pleasure to see you today Mr. Devall. Please make the following changes:  -please get your chest xray done and follow up with Dr Lamonte Sakai  -I have gotten blood work from you today and will call you with your results  -follow with behavioral health counselor -follow with dietician  If you have any questions or concerns, please call our clinic at [phone] between 9am-5pm and after hours call [phone] and ask for the internal medicine resident on call. If you feel you are having a medical emergency please call 911.   Thank you, we look forward to help you remain healthy!  [name], MD Internal Medicine PGY3

## 2019-07-08 NOTE — Progress Notes (Signed)
   CC: COPD follow up  HPI:  [name] is a 64 y.o. with copd, bph who presents for follow up. Please see problem based charting for evaluation, assessment, and plan.  Past Medical History:  Diagnosis Date  . COPD (chronic obstructive pulmonary disease) ([place]) 06/05/2012  . Dyspnea   . Dysrhythmia   . Emphysema 06/07/2012  . Heart murmur    hx of as baby  . History of kidney stones   . Hypoxemia 06/07/2012  . Obstructive chronic bronchitis with exacerbation ([place]) 06/07/2012  . Pneumonia   . Tobacco abuse 06/05/2012   Review of Systems:    Denies chest pain, nausea, vomiting, abdominal pain  Physical Exam:   Vitals:   07/08/19 1327 07/08/19 1352  BP: (!) 150/83 138/78  Pulse: 82 75  Temp: 98.4 F (36.9 C)   TempSrc: Oral   SpO2: 100%   Weight: 168 lb 6.4 oz (76.4 kg)    Physical Exam  Constitutional: He is oriented to person, place, and time. He appears well-developed and well-nourished. No distress.  HENT:  Head: Normocephalic and atraumatic.  Cardiovascular: Normal rate, regular rhythm and normal heart sounds.  Respiratory: Effort normal. No respiratory distress. He has wheezes.  With [place]  GI: Soft. Bowel sounds are normal. He exhibits no distension. There is no abdominal tenderness.  Musculoskeletal:        General: No edema.  Neurological: He is alert and oriented to person, place, and time.  Skin: He is not diaphoretic.  Psychiatric: He has a normal mood and affect. His behavior is normal. Judgment and thought content normal.   Assessment & Plan:   See Encounters Tab for problem based charting.  Patient discussed with Dr. Lynnae January

## 2019-07-08 NOTE — Assessment & Plan Note (Signed)
Patient is due for a colonoscopy.  The patient states that he is concerned about getting colonoscopy due to intubation needs.  Offered FIT DNA testing and spoke about how it can be done every 3 years. Patient was agreeable.  He does not have any contraindications to it.  Was also given pneumococcal 23 vaccine as he is now age of 61.

## 2019-07-09 LAB — BMP8+ANION GAP
Anion Gap: 12 mmol/L (ref 10.0–18.0)
BUN/Creatinine Ratio: 13 (ref 10–24)
BUN: 13 mg/dL (ref 8–27)
CO2: 30 mmol/L — ABNORMAL HIGH (ref 20–29)
Calcium: 9.9 mg/dL (ref 8.6–10.2)
Chloride: 97 mmol/L (ref 96–106)
Creatinine, Ser: 1.02 mg/dL (ref 0.76–1.27)
GFR calc Af Amer: 89 mL/min/{1.73_m2} (ref >59)
GFR calc non Af Amer: 77 mL/min/{1.73_m2} (ref >59)
Glucose: 91 mg/dL (ref 65–99)
Potassium: 4.5 mmol/L (ref 3.5–5.2)
Sodium: 139 mmol/L (ref 134–144)

## 2019-07-09 LAB — CBC
Hematocrit: 45 % (ref 37.5–51.0)
Hemoglobin: 15 g/dL (ref 13.0–17.7)
MCH: 28.9 pg (ref 26.6–33.0)
MCHC: 33.3 g/dL (ref 31.5–35.7)
MCV: 87 fL (ref 79–97)
Platelets: 285 10*3/uL (ref 150–450)
RBC: 5.19 x10E6/uL (ref 4.14–5.80)
RDW: 12.9 % (ref 11.6–15.4)
WBC: 7.1 10*3/uL (ref 3.4–10.8)

## 2019-07-14 ENCOUNTER — Telehealth: Payer: Self-pay | Admitting: Licensed Clinical Social Worker

## 2019-07-14 NOTE — Telephone Encounter (Signed)
Patient was called twice to discuss a referral for services. Patient's number was not working correctly. Patient will be mailed a letter.

## 2019-07-14 NOTE — Progress Notes (Signed)
Internal Medicine Clinic Attending  Case discussed with Dr. Chundi at the time of the visit.  We reviewed the resident's history and exam and pertinent patient test results.  I agree with the assessment, diagnosis, and plan of care documented in the resident's note. 

## 2019-07-15 ENCOUNTER — Telehealth: Payer: Self-pay | Admitting: Licensed Clinical Social Worker

## 2019-07-15 NOTE — Telephone Encounter (Signed)
Patient was called, and agreed to services. Patient will be added to my schedule for 3/23 at 9:00 via phone session.

## 2019-07-22 ENCOUNTER — Encounter: Payer: Medicare Other | Admitting: Dietician

## 2019-07-25 ENCOUNTER — Telehealth: Payer: Self-pay | Admitting: Internal Medicine

## 2019-07-25 NOTE — Telephone Encounter (Signed)
I did not call patient.

## 2019-07-25 NOTE — Telephone Encounter (Signed)
Pt missed call; pls return call [phone]

## 2019-07-28 ENCOUNTER — Other Ambulatory Visit: Payer: Self-pay | Admitting: Emergency Medicine

## 2019-07-28 MED ORDER — ALBUTEROL SULFATE HFA 108 (90 BASE) MCG/ACT IN AERS: INHALATION_SPRAY | RESPIRATORY_TRACT | 5 refills | Status: DC

## 2019-07-28 NOTE — Addendum Note (Signed)
Addended by: Yvonna Alanis E on: 07/28/2019 03:00 PM   Modules accepted: Orders

## 2019-07-29 ENCOUNTER — Ambulatory Visit (INDEPENDENT_AMBULATORY_CARE_PROVIDER_SITE_OTHER): Payer: Self-pay | Admitting: Licensed Clinical Social Worker

## 2019-07-29 ENCOUNTER — Encounter: Payer: Self-pay | Admitting: Licensed Clinical Social Worker

## 2019-07-29 ENCOUNTER — Other Ambulatory Visit: Payer: Self-pay

## 2019-07-29 DIAGNOSIS — Z658 Other specified problems related to psychosocial circumstances: Secondary | ICD-10-CM

## 2019-07-29 NOTE — BH Specialist Note (Signed)
Integrated Behavioral Health Visit via Telemedicine (Telephone)  07/29/2019 HAM CONK [id number]   Session Start time: 9:05  Session End time: 9:25 Total time: 20 minutes  Referring Provider: Dr. Maricela Bo Type of Visit: Telephonic Patient location: [facility] Provider location: Office All persons participating in visit: Patient and [facility]  Confirmed patient's address: Yes  Confirmed patient's phone number: Yes  Any changes to demographics: No   Discussed confidentiality: Yes    The following statements were read to the patient and/or legal guardian that are established with the [facility] Provider.  "The purpose of this phone visit is to provide behavioral health care while limiting exposure to the coronavirus (COVID19).  There is a possibility of technology failure and discussed alternative modes of communication if that failure occurs."  "By engaging in this telephone visit, you consent to the provision of healthcare.  Additionally, you authorize for your insurance to be billed for the services provided during this telephone visit."   Patient and/or legal guardian consented to telephone visit: Yes   PRESENTING CONCERNS: Patient and/or family reports the following symptoms/concerns: lonely and mild sadness. Duration of problem: increased over the past year; Severity of problem: mild   GOALS ADDRESSED: Patient will: 1.  Reduce symptoms of: loneliness.  2.  Increase knowledge and/or ability of: coping skills and healthy habits  3.  Demonstrate ability to: Increase healthy adjustment to current life circumstances and Increase adequate support systems for patient/family  INTERVENTIONS: Interventions utilized:  Motivational Interviewing and Supportive Counseling Standardized Assessments completed: assessed for SI, HI, and self-harm.  ASSESSMENT: Patient currently experiencing mild sadness due to being lonely at times. Patient reported that he is "stuck" at home.  Patient's car is not functioning, and this has limited his ability to leave his home. Patient identified that he talks on the phone, plays solitaire, and watches TV to keep himself occupied. Patient reported that he used to be more independent, and this is part of his feelings of isolation. Patient is in the process of finding another car, and he is hopeful this will reduce some of his isolation.   Patient may benefit from brief counseling.  PLAN: 1. Follow up with behavioral health clinician on : three weeks.    [name], [facility], [place]

## 2019-08-08 DIAGNOSIS — J449 Chronic obstructive pulmonary disease, unspecified: Secondary | ICD-10-CM | POA: Diagnosis not present

## 2019-08-20 ENCOUNTER — Other Ambulatory Visit: Payer: Self-pay

## 2019-08-20 ENCOUNTER — Encounter: Payer: Self-pay | Admitting: Licensed Clinical Social Worker

## 2019-08-20 ENCOUNTER — Ambulatory Visit (INDEPENDENT_AMBULATORY_CARE_PROVIDER_SITE_OTHER): Payer: Self-pay | Admitting: Licensed Clinical Social Worker

## 2019-08-20 DIAGNOSIS — Z658 Other specified problems related to psychosocial circumstances: Secondary | ICD-10-CM

## 2019-08-20 DIAGNOSIS — R4589 Other symptoms and signs involving emotional state: Secondary | ICD-10-CM

## 2019-08-20 NOTE — BH Specialist Note (Signed)
Integrated Behavioral Health Visit via Telemedicine (Telephone)  08/20/2019 NITHIN MERKLIN [id number]   Session Start time: 9:34  Session End time: 10:00 Total time: 26 minutes  Referring Provider: Dr. Maricela Bo Type of Visit: Telephonic Patient location: [facility] Provider location: office All persons participating in visit: Patient and [facility]  Confirmed patient's address: Yes  Confirmed patient's phone number: Yes  Any changes to demographics: No   Discussed confidentiality: Yes    The following statements were read to the patient and/or legal guardian that are established with the [facility] Provider.  "The purpose of this phone visit is to provide behavioral health care while limiting exposure to the coronavirus (COVID19).  There is a possibility of technology failure and discussed alternative modes of communication if that failure occurs."  "By engaging in this telephone visit, you consent to the provision of healthcare.  Additionally, you authorize for your insurance to be billed for the services provided during this telephone visit."   Patient and/or legal guardian consented to telephone visit: Yes   PRESENTING CONCERNS: Patient and/or family reports the following symptoms/concerns: lonely and mild sadness. Duration of problem: increased over the past year; Severity of problem: mild  GOALS ADDRESSED: Patient will: 1.  Reduce symptoms of: loneliness.  2.  Increase knowledge and/or ability of: coping skills and healthy habits  3.  Demonstrate ability to: Increase healthy adjustment to current life circumstances and Increase adequate support systems for patient/family  INTERVENTIONS: Interventions utilized:  Motivational Interviewing and Supportive Counseling Standardized Assessments completed: Not Needed  ASSESSMENT: Patient currently experiencing mild sadness and being lonely at times. Patient reported a slight improvement with coping with loneliness. Patient feels  bound to his house. Patient cannot leave due to his car being broke down. Patient is hopeful that once he gaines a new car, his loneliness with decrease.  Patient denies being uphappy with his life. Patient reported he does not think about being lonely everyday. Patient has activities that he can integrate into his life when he does get overwhelmed with moments of sadness.   Patient may benefit from counseling.  PLAN: 1. Follow up with behavioral health clinician on : three cars.  [name], [facility], [place]

## 2019-08-28 NOTE — Progress Notes (Signed)
@Patient  ID: Frank [name], male    DOB: [birth year], 64 y.o.   MRN: [id number]  Chief Complaint  Patient presents with  . Follow-up    COPD.  Eyes watering from pollen.  Dry cough from time to time.  Poor appitite.      Referring provider: [name], MD  HPI:  64 year old male former smoker followed in our office for COPD and chronic respiratory failure  PMH: BPH, diverticulosis, depression, decreased appetite Smoker/ Smoking History:  Former Smoker. Quit 2017.  Maintenance: Anoro Ellipta Pt of: [name]   08/29/2019  - Visit   64 year old male former smoker followed in our office for COPD Patient was last seen in February/2021 virtually by [name].  It was recommended that he follow-up in 3 months with our office with an x-ray.  He was encouraged to remain on Anoro Ellipta.  Maintained on oxygen 3 to 4 L.  Patient presenting to our office today with his sister.  He continues to be short of breath.  He reports that his breathing does improve when placed on steroids.  He also feels that he is having worsened allergies.  He reports adherence to his Anoro Ellipta as well as albuterol nebulized meds 3 times daily.  He also uses his rescue inhaler 3 times daily.  He is concerned that his breathing is not improving.  We will discuss this today.  There was concern at last televisit about patient's appetite being decreased.  [name] recommendation at last televisit was to have an x-ray.  Patient completed this with primary care.  Last x-ray from 07/08/2019 shows no acute cardiopulmonary disease with chronic hyperinflation and coarsened interstitial changes similar to prior.  Questionaires / Pulmonary Flowsheets:   MMRC: mMRC Dyspnea Scale mMRC Score  08/29/2019 3    Tests:   07/08/2019-chest x-ray-no acute cardiopulmonary disease, chronic hyperinflation and coarsened interstitial changes similar to priors  08/30/2012-pulmonary function test-FVC 3.93 (93% predicted), ratio 41,  FEV1 1.39 (46% predicted), no bronchodilator response, DLCO 10 (46% predicted)  FENO:  No results found for: NITRICOXIDE  PFT: No flowsheet data found.  WALK:  SIX MIN WALK 08/29/2019 05/30/2017 05/30/2017 05/29/2016 03/11/2014 12/26/2013 11/13/2013  Supplimental Oxygen during Test? (L/min) Yes Yes No Yes No No No  O2 Flow Rate 4 3 - 3 - - -  Type Pulse Continuous - Pulse - - -  Stopped or Paused before Six Minutes - - - - No - -  Tech Comments: Patient walked at a fast pace, he did have to stop to rest twice.  Sats dropped at the end of the walk on 5L, but recovered quickly to 94%. - sats dropped from 93% to 88% ambulating on RA,placed on 3L [place] sats went up to 90-92% ambulating - Walking at fast pace - only did 2 laps due to [place].  Patient placed on 2L resting 97% Pt walked a slow to moderate pace due to knee/leg pain prior to visit.  Pt tolerated walk well.  -    Imaging: No results found.  Lab Results:  CBC    Component Value Date/Time   WBC 6.6 08/29/2019 1151   RBC 4.92 08/29/2019 1151   HGB 14.2 08/29/2019 1151   HGB 15.0 07/08/2019 1418   HCT 42.9 08/29/2019 1151   HCT 45.0 07/08/2019 1418   PLT 248.0 08/29/2019 1151   PLT 285 07/08/2019 1418   MCV 87.2 08/29/2019 1151   MCV 87 07/08/2019 1418   MCH 28.9 07/08/2019 1418  MCH 28.9 09/25/2016 1348   MCHC 33.1 08/29/2019 1151   RDW 12.9 08/29/2019 1151   RDW 12.9 07/08/2019 1418   LYMPHSABS 1.9 08/29/2019 1151   MONOABS 0.9 08/29/2019 1151   EOSABS 0.3 08/29/2019 1151   BASOSABS 0.1 08/29/2019 1151    BMET    Component Value Date/Time   NA 139 07/08/2019 1418   K 4.5 07/08/2019 1418   CL 97 07/08/2019 1418   CO2 30 (H) 07/08/2019 1418   GLUCOSE 91 07/08/2019 1418   GLUCOSE 106 (H) 09/25/2016 1348   BUN 13 07/08/2019 1418   CREATININE 1.02 07/08/2019 1418   CALCIUM 9.9 07/08/2019 1418   GFRNONAA 77 07/08/2019 1418   GFRAA 89 07/08/2019 1418    BNP No results found for: BNP  ProBNP    Component Value  Date/Time   PROBNP 47.4 02/20/2014 1505    Specialty Problems      Pulmonary Problems   Chronic respiratory failure with hypoxia, on home O2 therapy ([place])    PFT scheduled on 07/30/12 with Dr Lamonte Sakai      COPD (chronic obstructive pulmonary disease) (HCC)   Allergic rhinitis      No Known Allergies  Immunization History  Administered Date(s) Administered  . Influenza Split 06/07/2012, 02/05/2017  . Influenza, High Dose Seasonal PF 03/18/2019  . Influenza, Quadrivalent, Recombinant, Inj, Pf 02/05/2018  . Influenza,inj,Quad PF,6+ Mos 01/29/2013, 02/04/2016, 03/07/2016  . Influenza-Unspecified 01/19/2015, 02/07/2017  . PFIZER SARS-COV-2 Vaccination 08/03/2019, 08/24/2019  . Pneumococcal Conjugate-13 11/13/2013  . Pneumococcal Polysaccharide-23 06/07/2012, 07/08/2019  . Tdap 06/18/2012    Past Medical History:  Diagnosis Date  . COPD (chronic obstructive pulmonary disease) ([place]) 06/05/2012  . Dyspnea   . Dysrhythmia   . Emphysema 06/07/2012  . Heart murmur    hx of as baby  . History of kidney stones   . Hypoxemia 06/07/2012  . Obstructive chronic bronchitis with exacerbation ([place]) 06/07/2012  . Pneumonia   . Tobacco abuse 06/05/2012    Tobacco History: Social History   Tobacco Use  Smoking Status Former Smoker  . Packs/day: 0.50  . Years: 35.00  . Pack years: 17.50  . Types: Cigarettes  . Quit date: 05/11/2015  . Years since quitting: 4.3  Smokeless Tobacco Former Systems developer  . Types: Chew  . Quit date: 05/08/1968   Counseling given: Yes   Continue to not smoke  Outpatient Encounter Medications as of 08/29/2019  Medication Sig  . albuterol (ACCUNEB) 0.63 MG/3ML nebulizer solution USE 1 VIAL IN NEBULIZER EVERY 6 HOURS  . albuterol (VENTOLIN HFA) 108 (90 Base) MCG/ACT inhaler INHALE 2 PUFFS BY MOUTH EVERY 4 HOURS AS NEEDED FOR WHEEZING OR SHORTNESS OF BREATH  . OXYGEN Inhale 3 L into the lungs continuous.   Marland Kitchen umeclidinium-vilanterol (ANORO ELLIPTA) 62.5-25 MCG/INH AEPB  INHALE 1 PUFF BY MOUTH INTO THE LUNGS EVERY DAY  . predniSONE (DELTASONE) 10 MG tablet 4 tabs for 2 days, then 3 tabs for 2 days, 2 tabs for 2 days, then 1 tab for 2 days, then stop   No facility-administered encounter medications on file as of 08/29/2019.     Review of Systems  Review of Systems  Constitutional: Positive for appetite change and fatigue. Negative for activity change, chills, fever and unexpected weight change.  HENT: Positive for congestion, postnasal drip and rhinorrhea. Negative for sinus pressure, sinus pain and sore throat.   Eyes: Negative.   Respiratory: Positive for cough and shortness of breath. Negative for wheezing.   Cardiovascular: Negative  for chest pain and palpitations.  Gastrointestinal: Negative for constipation, diarrhea, nausea and vomiting.  Endocrine: Negative.   Genitourinary: Negative.   Musculoskeletal: Negative.   Skin: Negative.   Neurological: Negative for dizziness and headaches.  Psychiatric/Behavioral: Negative.  Negative for dysphoric mood. The patient is not nervous/anxious.   All other systems reviewed and are negative.    Physical Exam  BP 130/66 (BP Location: Left Arm, Cuff Size: Normal)   Pulse 91   Temp (!) 97.2 F (36.2 C) (Temporal)   Ht 5\' 6"  (1.676 m)   Wt 161 lb 6.4 oz (73.2 kg)   SpO2 92%   BMI 26.05 kg/m   Wt Readings from Last 5 Encounters:  08/29/19 161 lb 6.4 oz (73.2 kg)  07/08/19 168 lb 6.4 oz (76.4 kg)  06/28/18 167 lb 6.4 oz (75.9 kg)  03/27/18 169 lb 12.8 oz (77 kg)  11/27/17 165 lb 6.4 oz (75 kg)    BMI Readings from Last 5 Encounters:  08/29/19 26.05 kg/m  07/08/19 28.91 kg/m  06/28/18 28.73 kg/m  03/27/18 27.41 kg/m  11/27/17 26.70 kg/m     Physical Exam Vitals and nursing note reviewed.  Constitutional:      General: He is not in acute distress.    Appearance: Normal appearance. He is normal weight.  HENT:     Head: Normocephalic and atraumatic.     Right Ear: Hearing, tympanic  membrane, ear canal and external ear normal.     Left Ear: Hearing, tympanic membrane, ear canal and external ear normal.     Nose: Rhinorrhea present. No mucosal edema.     Right Turbinates: Not enlarged.     Left Turbinates: Not enlarged.     Mouth/Throat:     Mouth: Mucous membranes are dry.     Pharynx: Oropharynx is clear. No oropharyngeal exudate.  Eyes:     Pupils: Pupils are equal, round, and reactive to light.  Cardiovascular:     Rate and Rhythm: Normal rate and regular rhythm.     Pulses: Normal pulses.     Heart sounds: Normal heart sounds. No murmur.  Pulmonary:     Effort: Pulmonary effort is normal.     Breath sounds: Wheezing (Inspiratory and expiratory wheezes) present. No decreased breath sounds or rales.  Musculoskeletal:     Cervical back: Normal range of motion.     Right lower leg: No edema.     Left lower leg: No edema.  Lymphadenopathy:     Cervical: No cervical adenopathy.  Skin:    General: Skin is warm and dry.     Capillary Refill: Capillary refill takes less than 2 seconds.     Findings: No erythema or rash.  Neurological:     General: No focal deficit present.     Mental Status: He is alert and oriented to person, place, and time.     Motor: No weakness.     Coordination: Coordination normal.     Gait: Gait is intact. Gait normal.  Psychiatric:        Mood and Affect: Mood normal.        Behavior: Behavior normal. Behavior is cooperative.        Thought Content: Thought content normal.        Judgment: Judgment normal.       Assessment & Plan:   Allergic rhinitis Worsened allergies  Plan: Start daily antihistamine Prednisone taper today Continue Flonase  Chronic respiratory failure with hypoxia, on home O2 therapy (  [place]) Plan: Continue oxygen therapy Maintain oxygen saturations above 88% Continue to use 3 to 4 L  COPD (chronic obstructive pulmonary disease) (HCC) Currently maintained on Anoro No recent history of CBC with  differential Atopic profile Patient feeling more short of breath Physically deconditioned Inspiratory and expiratory wheezes  Plan: Trial of Trelegy Ellipta Prednisone taper today Hold Anoro Ellipta CBC with differential ordered today Encourage patient to increase overall physical activity consistently each day 4-week follow-up with our office If symptoms persist despite these interventions need to consider additional lab work and potentially CT imaging  Decreased appetite Continue working with primary care regarding this Encourage patient to increase nutrient dense foods as well as increase protein Encourage patient to eat 2-3 meals a day with addition high-protein boost or Carnation drinks as snack  Physical deconditioning Likely worsened due to inactivity as well as known COPD and chronic respiratory failure  Plan: Encourage patient to increase overall physical activity and start consistently doing activity each day Goal should be 30 minutes of aerobic physical activity a day    Return in about 4 weeks (around 09/26/2019), or if symptoms worsen or fail to improve, for Follow up with [name], Follow up with Wyn Quaker FNP-C.   Lauraine Rinne, NP 08/29/2019   This appointment required 32 minutes of patient care (this includes precharting, chart review, review of results, face-to-face care, etc.).

## 2019-08-29 ENCOUNTER — Other Ambulatory Visit: Payer: Self-pay

## 2019-08-29 ENCOUNTER — Encounter: Payer: Self-pay | Admitting: Pulmonary Disease

## 2019-08-29 ENCOUNTER — Other Ambulatory Visit: Payer: Self-pay | Admitting: *Deleted

## 2019-08-29 ENCOUNTER — Ambulatory Visit: Payer: Medicare Other | Admitting: Pulmonary Disease

## 2019-08-29 VITALS — BP 130/66 | HR 91 | Temp 97.2°F | Ht 66.0 in | Wt 161.4 lb

## 2019-08-29 DIAGNOSIS — J9611 Chronic respiratory failure with hypoxia: Secondary | ICD-10-CM | POA: Diagnosis not present

## 2019-08-29 DIAGNOSIS — J449 Chronic obstructive pulmonary disease, unspecified: Secondary | ICD-10-CM | POA: Diagnosis not present

## 2019-08-29 DIAGNOSIS — J301 Allergic rhinitis due to pollen: Secondary | ICD-10-CM

## 2019-08-29 DIAGNOSIS — Z9981 Dependence on supplemental oxygen: Secondary | ICD-10-CM

## 2019-08-29 DIAGNOSIS — R5381 Other malaise: Secondary | ICD-10-CM

## 2019-08-29 DIAGNOSIS — R630 Anorexia: Secondary | ICD-10-CM | POA: Diagnosis not present

## 2019-08-29 LAB — CBC WITH DIFFERENTIAL/PLATELET
Basophils Absolute: 0.1 10*3/uL (ref 0.0–0.1)
Basophils Relative: 2.3 % (ref 0.0–3.0)
Eosinophils Absolute: 0.3 10*3/uL (ref 0.0–0.7)
Eosinophils Relative: 3.9 % (ref 0.0–5.0)
HCT: 42.9 % (ref 39.0–52.0)
Hemoglobin: 14.2 g/dL (ref 13.0–17.0)
Lymphocytes Relative: 29.2 % (ref 12.0–46.0)
Lymphs Abs: 1.9 10*3/uL (ref 0.7–4.0)
MCHC: 33.1 g/dL (ref 30.0–36.0)
MCV: 87.2 fl (ref 78.0–100.0)
Monocytes Absolute: 0.9 10*3/uL (ref 0.1–1.0)
Monocytes Relative: 13.4 % — ABNORMAL HIGH (ref 3.0–12.0)
Neutro Abs: 3.4 10*3/uL (ref 1.4–7.7)
Neutrophils Relative %: 51.2 % (ref 43.0–77.0)
Platelets: 248 10*3/uL (ref 150.0–400.0)
RBC: 4.92 Mil/uL (ref 4.22–5.81)
RDW: 12.9 % (ref 11.5–15.5)
WBC: 6.6 10*3/uL (ref 4.0–10.5)

## 2019-08-29 MED ORDER — PREDNISONE 10 MG PO TABS: ORAL_TABLET | ORAL | 0 refills | Status: DC

## 2019-08-29 MED ORDER — TRELEGY ELLIPTA 100-62.5-25 MCG/INH IN AEPB
1.0000 | INHALATION_SPRAY | Freq: Every day | RESPIRATORY_TRACT | 0 refills | Status: DC
Start: 2019-08-29 — End: 2019-09-26

## 2019-08-29 MED ORDER — TRELEGY ELLIPTA 100-62.5-25 MCG/INH IN AEPB: 1.0000 | INHALATION_SPRAY | Freq: Every day | RESPIRATORY_TRACT | 0 refills | Status: DC

## 2019-08-29 NOTE — Addendum Note (Signed)
Addended by: Vanessa Barbara on: 08/29/2019 04:46 PM   Modules accepted: Orders

## 2019-08-29 NOTE — Assessment & Plan Note (Signed)
Worsened allergies  Plan: Start daily antihistamine Prednisone taper today Continue Flonase

## 2019-08-29 NOTE — Patient Instructions (Addendum)
You were seen today by Lauraine Rinne, NP  for:   1. Chronic obstructive pulmonary disease, unspecified COPD type ([place])  - CBC with Differential/Platelet; Future  Prednisone 10mg  tablet  >>>4 tabs for 2 days, then 3 tabs for 2 days, 2 tabs for 2 days, then 1 tab for 2 days, then stop >>>take with food  >>>take in the morning   Trial of Trelegy Ellipta  >>> 1 puff daily in the morning >>>rinse mouth out after use  >>> This inhaler contains 3 medications that help manage her respiratory status, contact our office if you cannot afford this medication or unable to remain on this medication  Hold your Anoro while taking Trelegy  Only use your albuterol as a rescue medication to be used if you can't catch your breath by resting or doing a relaxed purse lip breathing pattern.  - The less you use it, the better it will work when you need it. - Ok to use up to 2 puffs  every 4 hours if you must but call for immediate appointment if use goes up over your usual need - Don't leave home without it !!  (think of it like the spare tire for your car)   Note your daily symptoms > remember "red flags" for COPD:   >>>Increase in cough >>>increase in sputum production >>>increase in shortness of breath or activity  intolerance.   If you notice these symptoms, please call the office to be seen.    Please work diligently on increasing your overall physical activity  Please try to eat 2-3 meals a day with a high-protein boost or Ensure in between as a snack  Keep follow-up with primary care   2. Chronic respiratory failure with hypoxia, on home O2 therapy (HCC)  Continue oxygen therapy as prescribed  >>>maintain oxygen saturations greater than 88 percent  >>>if unable to maintain oxygen saturations please contact the office  >>>do not smoke with oxygen  >>>can use nasal saline gel or nasal saline rinses to moisturize nose if oxygen causes dryness  We will call [employer] to investigate whether or not  you want to drive to [facility] for oxygen  3. Allergic rhinitis due to pollen, unspecified seasonality  Please start taking a daily antihistamine:  >>>choose one of: zyrtec, claritin, allegra, or xyzal  >>>these are over the counter medications  >>>can choose generic option  >>>take daily  >>>this medication helps with allergies, post nasal drip, and cough     We recommend today:  Orders Placed This Encounter  Procedures  . CBC with Differential/Platelet    Standing Status:   Future    Standing Expiration Date:   08/28/2020   Orders Placed This Encounter  Procedures  . CBC with Differential/Platelet   No orders of the defined types were placed in this encounter.   Follow Up:    Return in about 4 weeks (around 09/26/2019), or if symptoms worsen or fail to improve, for Follow up with [name], Follow up with Wyn Quaker FNP-C.   Please do your part to reduce the spread of COVID-19:      Reduce your risk of any infection  and COVID19 by using the similar precautions used for avoiding the common cold or flu:  Marland Kitchen Wash your hands often with soap and warm water for at least 20 seconds.  If soap and water are not readily available, use an alcohol-based hand sanitizer with at least 60% alcohol.  . If coughing or sneezing,  cover your mouth and nose by coughing or sneezing into the elbow areas of your shirt or coat, into a tissue or into your sleeve (not your hands). Langley Gauss A MASK when in public  . Avoid shaking hands with others and consider head nods or verbal greetings only. . Avoid touching your eyes, nose, or mouth with unwashed hands.  . Avoid close contact with people who are sick. . Avoid places or events with large numbers of people in one location, like concerts or sporting events. . If you have some symptoms but not all symptoms, continue to monitor at home and seek medical attention if your symptoms worsen. . If you are having a medical emergency, call  911.   [place] / e-Visit: [email or web address]         [facility]: [facility]: [id number]                   [facility]: [id number]     It is flu season:   >>> Best ways to protect herself from the flu: Receive the yearly flu vaccine, practice good hand hygiene washing with soap and also using hand sanitizer when available, eat a nutritious meals, get adequate rest, hydrate appropriately   Please contact the office if your symptoms worsen or you have concerns that you are not improving.   Thank you for choosing [facility] for your healthcare, and for allowing [place] to partner with you on your healthcare journey. I am thankful to be able to provide care to you today.   Wyn Quaker FNP-C

## 2019-08-29 NOTE — Assessment & Plan Note (Signed)
Continue working with primary care regarding this Encourage patient to increase nutrient dense foods as well as increase protein Encourage patient to eat 2-3 meals a day with addition high-protein boost or Carnation drinks as snack

## 2019-08-29 NOTE — Addendum Note (Signed)
Addended by: Vanessa Barbara on: 08/29/2019 05:08 PM   Modules accepted: Orders

## 2019-08-29 NOTE — Assessment & Plan Note (Signed)
Plan: Continue oxygen therapy Maintain oxygen saturations above 88% Continue to use 3 to 4 L

## 2019-08-29 NOTE — Assessment & Plan Note (Addendum)
Currently maintained on Anoro No recent history of CBC with differential Atopic profile Patient feeling more short of breath Physically deconditioned Inspiratory and expiratory wheezes  Plan: Trial of Trelegy Ellipta Prednisone taper today Hold Anoro Ellipta CBC with differential ordered today Encourage patient to increase overall physical activity consistently each day 4-week follow-up with our office If symptoms persist despite these interventions need to consider additional lab work and potentially CT imaging

## 2019-08-29 NOTE — Assessment & Plan Note (Signed)
Likely worsened due to inactivity as well as known COPD and chronic respiratory failure  Plan: Encourage patient to increase overall physical activity and start consistently doing activity each day Goal should be 30 minutes of aerobic physical activity a day

## 2019-09-01 ENCOUNTER — Other Ambulatory Visit: Payer: Self-pay | Admitting: Emergency Medicine

## 2019-09-01 ENCOUNTER — Telehealth: Payer: Self-pay | Admitting: Pulmonary Disease

## 2019-09-01 NOTE — Telephone Encounter (Signed)
Pt called back, please return call-- anytime before 4:30

## 2019-09-01 NOTE — Telephone Encounter (Signed)
Pt returning call.  [phone] °

## 2019-09-01 NOTE — Progress Notes (Signed)
Eosinophil count elevated on lab work.  Continue trial of Trelegy Ellipta.Keep follow-up with our officeNotify our office on how you are doing on the Trelegy Ellipta.Wyn Quaker, FNP

## 2019-09-01 NOTE — Telephone Encounter (Signed)
Let me see if I can help further clarify for the patient:  [name] had previously recommended that he take his albuterol 3 times daily scheduled.  Patient was also using his rescue inhaler 4-6 times a day if I remember correctly.  We trialed the patient at last week's office visit on Trelegy Ellipta.  He should be using this.  If he is doing well on Trelegy Ellipta we will hopefully see a decrease in his rescue inhaler use.  And also hopefully be able to start decreasing down his albuterol nebulized medication.  Since he is on the Trelegy Ellipta which has an inhaled corticosteroid, long-acting bronchodilator as well as a muscarinic.  3 long-acting medications for managing his breathing.  I would recommend that he start using his albuterol nebulized med 2 times daily instead of 3 times daily to see if he can tolerate.  We can hopefully slowly work to decrease down on his albuterol use.  As I explained to the patient last week using albuterol 5-6 times a day could eventually lead to poor response to the medication.  I am hopeful that the Trelegy Ellipta will allow for better breathing as well as less albuterol use.  Wyn Quaker, FNP

## 2019-09-01 NOTE — Telephone Encounter (Signed)
Called patient and reviewed recommendations per Wyn Quaker message. Pt did not realize that albuterol neb and inhaler albuterol were the same. He is using his Trelegy. Advised that plan is to eventually taper him down on albuterol. Patient verbalized understanding. Nothing further needed.

## 2019-09-01 NOTE — Telephone Encounter (Signed)
Per patient's 4.23.21 instructions from [facility] NP, this was discussed about how his albuterol should be used on an as needed basis only (please view copied/pasted portion below).  LMOM TCB x1 to discuss.  Patient Instructions You were seen today by Lauraine Rinne, NP  for:    1. Chronic obstructive pulmonary disease, unspecified COPD type ([place])   - CBC with Differential/Platelet; Future   Prednisone 10mg  tablet  >>>4 tabs for 2 days, then 3 tabs for 2 days, 2 tabs for 2 days, then 1 tab for 2 days, then stop >>>take with food  >>>take in the morning    Trial of Trelegy Ellipta  >>> 1 puff daily in the morning >>>rinse mouth out after use  >>> This inhaler contains 3 medications that help manage her respiratory status, contact our office if you cannot afford this medication or unable to remain on this medication   Hold your Anoro while taking Trelegy   Only use your albuterol as a rescue medication to be used if you can't catch your breath by resting or doing a relaxed purse lip breathing pattern.  - The less you use it, the better it will work when you need it. - Ok to use up to 2 puffs  every 4 hours if you must but call for immediate appointment if use goes up over your usual need - Don't leave home without it !!  (think of it like the spare tire for your car)    Note your daily symptoms > remember "red flags" for COPD:   >>>Increase in cough >>>increase in sputum production >>>increase in shortness of breath or activity  intolerance.    If you notice these symptoms, please call the office to be seen.

## 2019-09-01 NOTE — Progress Notes (Signed)
Patient identification verified. Results of recent CBC reviewed. Per Wyn Quaker FNP, Eosinophil count elevated on lab work. Continue trial of Trelegy Ellipta. Keep follow-up with our office. Notify our office on how you are doing on the Trelegy Ellipta. Patient verbalized understanding of results and plan of care.

## 2019-09-01 NOTE — Telephone Encounter (Signed)
Called spoke with patient who is expressing confusion over his Albuterol Rx's - Ventolin and Albuterol neb.  Patient stated that he recalls the discussion about his Ventolin only being used as a rescue prn.  However, Dr Lamonte Sakai has reportedly informed him to take the Albuterol neb on a schedule and the med list reflects this.  Per 2.21.2020 office note with RB:  Assessment & Plan:  COPD (chronic obstructive pulmonary disease) ([place]) Mr. Ziemba is overall stable.  He has gotten through a recent URI without an acute exacerbation, appears to be improving.  He still has exertional dyspnea, some difficulty with his ADL.  He was unable to go to pulmonary rehab because of cost barriers.  We may be able to revisit this in the future.  He is up-to-date on his flu shot, needs his next pneumonia vaccine after age 11.  He is tolerating Anoro and benefits from it although he is using albuterol nebs on a schedule.  We will continue this regimen.  He is on disability for his pulmonary disease and chronic respiratory failure.  I will write a letter today to document this so that he can discuss it with his CPA.   Patient is asking for some clarification on whether or not both Albuterol Rx's should be used as a rescue on a PRN basis, only the Ventolin with the Albuterol neb soln on a schedule.  Please advise, thank you.   Aaron Edelman NP, since you saw the patient most recently would you be able to clarify this for patient or should we wait for Dr Lamonte Sakai (out of the office per qgenda until 4/28)?  Thank you!

## 2019-09-01 NOTE — Telephone Encounter (Signed)
Attempted to call pt but unable to reach. Left message for pt to return call. 

## 2019-09-07 DIAGNOSIS — J449 Chronic obstructive pulmonary disease, unspecified: Secondary | ICD-10-CM | POA: Diagnosis not present

## 2019-09-08 ENCOUNTER — Telehealth: Payer: Self-pay | Admitting: Pulmonary Disease

## 2019-09-08 NOTE — Telephone Encounter (Signed)
Glad the Trelegy Ellipta is helping.  Keep follow-up with our office.  Has the patient started a daily antihistamine as discussed?  Our recommendations would be:  Please start taking a daily antihistamine:  >>>choose one of: zyrtec, claritin, allegra, or xyzal  >>>these are over the counter medications  >>>can choose generic option  >>>take daily  >>>this medication helps with allergies, post nasal drip, and cough   Patient can also use Flonase 1 spray each nostril daily after nasal saline rinses.  Patient can utilize nasal saline rinses more often if he is noticing clinical relief from them.  No new recommendations other than that.  As always if the patient would like to be seen sooner he is more than welcome to schedule an appointment.  Wyn Quaker, FNP

## 2019-09-08 NOTE — Telephone Encounter (Signed)
Patient last seen 4.23.21 by Aaron Edelman NP: Assessment & Plan:    Allergic rhinitis Worsened allergies   Plan: Start daily antihistamine Prednisone taper today Continue Flonase   Chronic respiratory failure with hypoxia, on home O2 therapy ([place]) Plan: Continue oxygen therapy Maintain oxygen saturations above 88% Continue to use 3 to 4 L   COPD (chronic obstructive pulmonary disease) (HCC) Currently maintained on Anoro No recent history of CBC with differential Atopic profile Patient feeling more short of breath Physically deconditioned Inspiratory and expiratory wheezes   Plan: Trial of Trelegy Ellipta Prednisone taper today Hold Anoro Ellipta CBC with differential ordered today Encourage patient to increase overall physical activity consistently each day 4-week follow-up with our office If symptoms persist despite these interventions need to consider additional lab work and potentially CT imaging   Decreased appetite Continue working with primary care regarding this Encourage patient to increase nutrient dense foods as well as increase protein Encourage patient to eat 2-3 meals a day with addition high-protein boost or Carnation drinks as snack   Physical deconditioning Likely worsened due to inactivity as well as known COPD and chronic respiratory failure   Plan: Encourage patient to increase overall physical activity and start consistently doing activity each day Goal should be 30 minutes of aerobic physical activity a day   Return in about 4 weeks (around 09/26/2019), or if symptoms worsen or fail to improve, for Follow up with [name], Follow up with Wyn Quaker FNP-C.   Called spoke with patient who finished his prednisone yesterday 5/2 and cough and tightness in chest returned once he started the lower doses of the prednisone.  Cough is dry in nature.  Patient stated when he uses a sinus rinse, the cough improves.  Some head congestion with minimal PND.  No wheezing  or increased shortness of breath.  Has been taking Mucinex and noticed this helped some, hasn't taken in the last couple of days.  Still taking the daily antihistamine as recommended at last office visit.  Patient does feel like the Trelegy is helping his overall breathing - stated that he feels like "it keeps him more open" and breathing is easier.  Patient is asking about additional options for the lingering cough.

## 2019-09-08 NOTE — Telephone Encounter (Signed)
Spoke with pt, aware of recs.  Nothing further needed at this time- will close encounter.   

## 2019-09-09 ENCOUNTER — Telehealth: Payer: Self-pay | Admitting: *Deleted

## 2019-09-09 NOTE — Telephone Encounter (Signed)
05-04-02021 1:45p  Called Mr Frank [name] to discuss IFOBT kit received by mail. ( DOB Verified) Specimen was possibly delayed mail system.  Too old for analysis. Dr Maricela Bo asked for patient to recollect.    Mr Frank [name] agrees to collect another sample and hand deliver the completed kit to [facility].   The distance from [facility] to [facility] is a difficult walk for Mr Frank [name].  I explained that he can call me at [phone] and I would meet him at Entrance A to receive the kit.  I will mail a new kit to Mr Frank [name] today.  Maryan Rued, PBT

## 2019-09-10 ENCOUNTER — Telehealth: Payer: Self-pay | Admitting: Licensed Clinical Social Worker

## 2019-09-10 ENCOUNTER — Ambulatory Visit: Payer: Medicare Other | Admitting: Licensed Clinical Social Worker

## 2019-09-10 NOTE — Telephone Encounter (Signed)
Patient was called twice today for his scheduled visit. Patient did not answer. A vm was left twice for the patient to call our office to reschedule.

## 2019-09-24 ENCOUNTER — Encounter: Payer: Self-pay | Admitting: Licensed Clinical Social Worker

## 2019-09-24 ENCOUNTER — Ambulatory Visit (INDEPENDENT_AMBULATORY_CARE_PROVIDER_SITE_OTHER): Payer: Self-pay | Admitting: Licensed Clinical Social Worker

## 2019-09-24 ENCOUNTER — Other Ambulatory Visit: Payer: Self-pay

## 2019-09-24 DIAGNOSIS — Z658 Other specified problems related to psychosocial circumstances: Secondary | ICD-10-CM

## 2019-09-24 NOTE — BH Specialist Note (Signed)
Integrated Behavioral Health Visit via Telemedicine (Telephone)  09/24/2019 Frank [name] [id number]   Session Start time: 1:05  Session End time: 1:25 Total time: 20 minutes  Referring Provider: Dr. Maricela Bo Type of Visit: Telephonic Patient location: [facility] Provider location: Office All persons participating in visit: Patient and [facility]  Confirmed patient's address: Yes  Confirmed patient's phone number: Yes  Any changes to demographics: No   Discussed confidentiality: Yes    The following statements were read to the patient and/or legal guardian that are established with the [facility] Provider.  "The purpose of this phone visit is to provide behavioral health care while limiting exposure to the coronavirus (COVID19).  There is a possibility of technology failure and discussed alternative modes of communication if that failure occurs."  "By engaging in this telephone visit, you consent to the provision of healthcare.  Additionally, you authorize for your insurance to be billed for the services provided during this telephone visit."   Patient and/or legal guardian consented to telephone visit: Yes   PRESENTING CONCERNS: Patient and/or family reports the following symptoms/concerns: lonely and mild sadness.  Duration of problem: increased over the past year; Severity of problem: mild  GOALS ADDRESSED: Patient will: 1.  Reduce symptoms of: loneliness.  2.  Increase knowledge and/or ability of: coping skills and healthy habits  3.  Demonstrate ability to: Increase healthy adjustment to current life circumstances and Increase adequate support systems for patient/family  INTERVENTIONS: Interventions utilized:  Motivational Interviewing and Supportive Counseling Standardized Assessments completed: Not Needed  ASSESSMENT: Patient currently experiencing mild levels of loneliness.  Patient reported he has been sick since our last visit, which has decreased his ability to  leave his home. Patient is still searching for a car, and he is hopeful this will increase his opportunities for independence. Patient's sadness has decreased over the past month. Patient is talking to his friends regularly and spending time with his sister.    Patient may benefit from prn counseling.  PLAN: 1. Follow up with behavioral health clinician on : six weeks for a check-in.   [name], [facility], [place]

## 2019-09-25 NOTE — Progress Notes (Signed)
@Patient  ID: Frank [name], male    DOB: 03-07-1956, 64 y.o.   MRN: [id number]  Chief Complaint  Patient presents with  . Follow-up    COPD follow-up    Referring provider: [name], MD  HPI:  64 year old male former smoker followed in our office for COPD and chronic respiratory failure  PMH: BPH, diverticulosis, depression, decreased appetite Smoker/ Smoking History:  Former Smoker. Quit 2017.  Maintenance: Trelegy Ellipta  Pt of: [name]   09/26/2019  - Visit   64 year old male former smoker followed in our office for COPD and chronic respiratory failure.  Patient completing 4-week follow-up with our office today.  He believes that the Trelegy Ellipta may be helping him with his breathing.  He is unsure.  He has started taking Zyrtec as well as using Flonase and using a humidifier.  He believes that this is helped him the most with some of his symptoms.  He feels that he is at his baseline.  He has no worsening cough, congestion or wheezing today.  He needs refills of Flonase.  Questionaires / Pulmonary Flowsheets:   MMRC: mMRC Dyspnea Scale mMRC Score  09/26/2019 3  08/29/2019 3    Tests:   07/08/2019-chest x-ray-no acute cardiopulmonary disease, chronic hyperinflation and coarsened interstitial changes similar to priors  08/30/2012-pulmonary function test-FVC 3.93 (93% predicted), ratio 41, FEV1 1.39 (46% predicted), no bronchodilator response, DLCO 10 (46% predicted)  08/29/2019-CBC with differential-eosinophils relative 3.9, eosinophils absolute 0.3  FENO:  No results found for: NITRICOXIDE  PFT: No flowsheet data found.  WALK:  SIX MIN WALK 08/29/2019 05/30/2017 05/30/2017 05/29/2016 03/11/2014 12/26/2013 11/13/2013  Supplimental Oxygen during Test? (L/min) Yes Yes No Yes No No No  O2 Flow Rate 4 3 - 3 - - -  Type Pulse Continuous - Pulse - - -  Stopped or Paused before Six Minutes - - - - No - -  Tech Comments: Patient walked at a fast pace, he did have to stop  to rest twice.  Sats dropped at the end of the walk on 5L, but recovered quickly to 94%. - sats dropped from 93% to 88% ambulating on RA,placed on 3L  sats went up to 90-92% ambulating - Walking at fast pace - only did 2 laps due to [place].  Patient placed on 2L resting 97% Pt walked a slow to moderate pace due to knee/leg pain prior to visit.  Pt tolerated walk well.  -    Imaging: No results found.  Lab Results:  CBC    Component Value Date/Time   WBC 6.6 08/29/2019 1151   RBC 4.92 08/29/2019 1151   HGB 14.2 08/29/2019 1151   HGB 15.0 07/08/2019 1418   HCT 42.9 08/29/2019 1151   HCT 45.0 07/08/2019 1418   PLT 248.0 08/29/2019 1151   PLT 285 07/08/2019 1418   MCV 87.2 08/29/2019 1151   MCV 87 07/08/2019 1418   MCH 28.9 07/08/2019 1418   MCH 28.9 09/25/2016 1348   MCHC 33.1 08/29/2019 1151   RDW 12.9 08/29/2019 1151   RDW 12.9 07/08/2019 1418   LYMPHSABS 1.9 08/29/2019 1151   MONOABS 0.9 08/29/2019 1151   EOSABS 0.3 08/29/2019 1151   BASOSABS 0.1 08/29/2019 1151    BMET    Component Value Date/Time   NA 139 07/08/2019 1418   K 4.5 07/08/2019 1418   CL 97 07/08/2019 1418   CO2 30 (H) 07/08/2019 1418   GLUCOSE 91 07/08/2019 1418   GLUCOSE 106 (H)  09/25/2016 1348   BUN 13 07/08/2019 1418   CREATININE 1.02 07/08/2019 1418   CALCIUM 9.9 07/08/2019 1418   GFRNONAA 77 07/08/2019 1418   GFRAA 89 07/08/2019 1418    BNP No results found for: BNP  ProBNP    Component Value Date/Time   PROBNP 47.4 02/20/2014 1505    Specialty Problems      Pulmonary Problems   Chronic respiratory failure with hypoxia, on home O2 therapy (HCC)    PFT scheduled on 07/30/12 with Dr Lamonte Sakai      COPD (chronic obstructive pulmonary disease) ([place])   Allergic rhinitis      No Known Allergies  Immunization History  Administered Date(s) Administered  . Influenza Split 06/07/2012, 02/05/2017  . Influenza, High Dose Seasonal PF 03/18/2019  . Influenza, Quadrivalent, Recombinant, Inj,  Pf 02/05/2018  . Influenza,inj,Quad PF,6+ Mos 01/29/2013, 02/04/2016, 03/07/2016  . Influenza-Unspecified 01/19/2015, 02/07/2017  . PFIZER SARS-COV-2 Vaccination 08/03/2019, 08/24/2019  . Pneumococcal Conjugate-13 11/13/2013  . Pneumococcal Polysaccharide-23 06/07/2012, 07/08/2019  . Tdap 06/18/2012    Past Medical History:  Diagnosis Date  . COPD (chronic obstructive pulmonary disease) ([place]) 06/05/2012  . Dyspnea   . Dysrhythmia   . Emphysema 06/07/2012  . Heart murmur    hx of as baby  . History of kidney stones   . Hypoxemia 06/07/2012  . Obstructive chronic bronchitis with exacerbation ([place]) 06/07/2012  . Pneumonia   . Tobacco abuse 06/05/2012    Tobacco History: Social History   Tobacco Use  Smoking Status Former Smoker  . Packs/day: 0.50  . Years: 35.00  . Pack years: 17.50  . Types: Cigarettes  . Quit date: 05/11/2015  . Years since quitting: 4.3  Smokeless Tobacco Former Systems developer  . Types: Chew  . Quit date: 05/08/1968   Counseling given: Yes   Continue to not smoke  Outpatient Encounter Medications as of 09/26/2019  Medication Sig  . albuterol (ACCUNEB) 0.63 MG/3ML nebulizer solution USE 1 VIAL IN NEBULIZER EVERY 6 HOURS  . albuterol (VENTOLIN HFA) 108 (90 Base) MCG/ACT inhaler INHALE 2 PUFFS BY MOUTH EVERY 4 HOURS AS NEEDED FOR WHEEZING OR SHORTNESS OF BREATH  . OXYGEN Inhale 3 L into the lungs continuous.   . predniSONE (DELTASONE) 10 MG tablet 4 tabs for 2 days, then 3 tabs for 2 days, 2 tabs for 2 days, then 1 tab for 2 days, then stop  . umeclidinium-vilanterol (ANORO ELLIPTA) 62.5-25 MCG/INH AEPB INHALE 1 PUFF BY MOUTH INTO THE LUNGS EVERY DAY  . [DISCONTINUED] Fluticasone-Umeclidin-Vilant (TRELEGY ELLIPTA) 100-62.5-25 MCG/INH AEPB Inhale 1 puff into the lungs daily.  . [DISCONTINUED] Fluticasone-Umeclidin-Vilant (TRELEGY ELLIPTA) 100-62.5-25 MCG/INH AEPB Inhale 1 puff into the lungs daily.  . [DISCONTINUED] Fluticasone-Umeclidin-Vilant (TRELEGY ELLIPTA)  100-62.5-25 MCG/INH AEPB Inhale 1 puff into the lungs daily.  . [DISCONTINUED] Fluticasone-Umeclidin-Vilant (TRELEGY ELLIPTA) 100-62.5-25 MCG/INH AEPB Inhale 1 puff into the lungs daily.  . fluticasone (FLONASE) 50 MCG/ACT nasal spray Place 1 spray into both nostrils daily.  . Fluticasone-Umeclidin-Vilant (TRELEGY ELLIPTA) 100-62.5-25 MCG/INH AEPB Inhale 1 puff into the lungs daily.   No facility-administered encounter medications on file as of 09/26/2019.     Review of Systems  Review of Systems  Constitutional: Positive for fatigue. Negative for activity change, chills, fever and unexpected weight change.  HENT: Positive for congestion and postnasal drip. Negative for rhinorrhea, sinus pressure, sinus pain and sore throat.   Eyes: Negative.   Respiratory: Positive for cough. Negative for shortness of breath and wheezing.   Cardiovascular: Negative for  chest pain and palpitations.  Gastrointestinal: Negative for constipation, diarrhea, nausea and vomiting.  Endocrine: Negative.   Genitourinary: Negative.   Musculoskeletal: Negative.   Skin: Negative.   Neurological: Negative for dizziness and headaches.  Psychiatric/Behavioral: Negative.  Negative for dysphoric mood. The patient is not nervous/anxious.   All other systems reviewed and are negative.    Physical Exam  BP 138/64 (BP Location: Left Arm, Cuff Size: Normal)   Pulse 95   Temp 98.1 F (36.7 C) (Temporal)   Ht 5' 7.72" (1.72 m)   Wt 161 lb 12.8 oz (73.4 kg)   SpO2 99%   BMI 24.81 kg/m   Wt Readings from Last 5 Encounters:  09/26/19 161 lb 12.8 oz (73.4 kg)  08/29/19 161 lb 6.4 oz (73.2 kg)  07/08/19 168 lb 6.4 oz (76.4 kg)  06/28/18 167 lb 6.4 oz (75.9 kg)  03/27/18 169 lb 12.8 oz (77 kg)    BMI Readings from Last 5 Encounters:  09/26/19 24.81 kg/m  08/29/19 26.05 kg/m  07/08/19 28.91 kg/m  06/28/18 28.73 kg/m  03/27/18 27.41 kg/m     Physical Exam Vitals and nursing note reviewed.    Constitutional:      General: He is not in acute distress.    Appearance: Normal appearance. He is normal weight.  HENT:     Head: Normocephalic and atraumatic.     Right Ear: Hearing, tympanic membrane, ear canal and external ear normal. There is no impacted cerumen.     Left Ear: Hearing, tympanic membrane, ear canal and external ear normal. There is no impacted cerumen.     Nose: Congestion and rhinorrhea present. No mucosal edema.     Right Turbinates: Not enlarged.     Left Turbinates: Not enlarged.     Mouth/Throat:     Mouth: Mucous membranes are dry.     Pharynx: Oropharynx is clear. No oropharyngeal exudate.  Eyes:     Pupils: Pupils are equal, round, and reactive to light.  Cardiovascular:     Rate and Rhythm: Normal rate and regular rhythm.     Pulses: Normal pulses.     Heart sounds: Normal heart sounds. No murmur.  Pulmonary:     Effort: Pulmonary effort is normal.     Breath sounds: No decreased breath sounds, wheezing or rales.  Musculoskeletal:     Cervical back: Normal range of motion.     Right lower leg: No edema.     Left lower leg: No edema.  Lymphadenopathy:     Cervical: No cervical adenopathy.  Skin:    General: Skin is warm and dry.     Capillary Refill: Capillary refill takes less than 2 seconds.     Findings: No erythema or rash.  Neurological:     General: No focal deficit present.     Mental Status: He is alert and oriented to person, place, and time.     Motor: No weakness.     Coordination: Coordination normal.     Gait: Gait is intact. Gait normal.  Psychiatric:        Mood and Affect: Mood normal.        Behavior: Behavior normal. Behavior is cooperative.        Thought Content: Thought content normal.        Judgment: Judgment normal.       Assessment & Plan:   Chronic respiratory failure with hypoxia, on home O2 therapy (HCC) Plan: Continue oxygen therapy Maintain oxygen saturations above  88% Continue to use 3 to 4 L  COPD  (chronic obstructive pulmonary disease) (HCC) Status post prednisone taper, with symptom relief Tolerates Anoro Ellipta, feels that this is helpful Peripheral eosinophilia seen on April/2021 lab work Atopic profile Clear breath sounds bilaterally 2014 pulmonary function test showed severe obstruction with diffusion defect, reviewed with patient this likely has worsened over the last 7 years  Plan: Continue Trelegy Ellipta Continue oxygen therapy Remain physically active Continue Flonase Continue Zyrtec Start nasal saline rinses twice daily prior to Flonase   Allergic rhinitis Atopic profile Peripheral eosinophilia on lab work Symptoms have improved since starting Zyrtec as well as Flonase  Plan: Continue Zyrtec Continue Flonase Start nasal saline rinses twice daily  Physical deconditioning Plan: Continue to encourage patient to increase overall physical activity    Return in about 3 months (around 12/27/2019), or if symptoms worsen or fail to improve, for Follow up with [name].   Lauraine Rinne, NP 09/26/2019   This appointment required 32 minutes of patient care (this includes precharting, chart review, review of results, face-to-face care, etc.).

## 2019-09-26 ENCOUNTER — Other Ambulatory Visit: Payer: Self-pay

## 2019-09-26 ENCOUNTER — Other Ambulatory Visit: Payer: Self-pay | Admitting: *Deleted

## 2019-09-26 ENCOUNTER — Ambulatory Visit: Payer: Medicare Other | Admitting: Pulmonary Disease

## 2019-09-26 ENCOUNTER — Encounter: Payer: Self-pay | Admitting: Pulmonary Disease

## 2019-09-26 VITALS — BP 138/64 | HR 95 | Temp 98.1°F | Ht 67.72 in | Wt 161.8 lb

## 2019-09-26 DIAGNOSIS — R5381 Other malaise: Secondary | ICD-10-CM | POA: Diagnosis not present

## 2019-09-26 DIAGNOSIS — J9611 Chronic respiratory failure with hypoxia: Secondary | ICD-10-CM | POA: Diagnosis not present

## 2019-09-26 DIAGNOSIS — J449 Chronic obstructive pulmonary disease, unspecified: Secondary | ICD-10-CM | POA: Diagnosis not present

## 2019-09-26 DIAGNOSIS — J301 Allergic rhinitis due to pollen: Secondary | ICD-10-CM | POA: Diagnosis not present

## 2019-09-26 DIAGNOSIS — Z9981 Dependence on supplemental oxygen: Secondary | ICD-10-CM

## 2019-09-26 MED ORDER — TRELEGY ELLIPTA 100-62.5-25 MCG/INH IN AEPB
1.0000 | INHALATION_SPRAY | Freq: Every day | RESPIRATORY_TRACT | 6 refills | Status: DC
Start: 2019-09-26 — End: 2020-07-26

## 2019-09-26 MED ORDER — FLUTICASONE PROPIONATE 50 MCG/ACT NA SUSP: 1.0000 | Freq: Every day | NASAL | 3 refills | Status: DC

## 2019-09-26 NOTE — Assessment & Plan Note (Signed)
Plan: Continue oxygen therapy Maintain oxygen saturations above 88% Continue to use 3 to 4 L

## 2019-09-26 NOTE — Assessment & Plan Note (Signed)
Plan: Continue to encourage patient to increase overall physical activity

## 2019-09-26 NOTE — Patient Instructions (Addendum)
You were seen today by Lauraine Rinne, NP  for:   1. Chronic obstructive pulmonary disease, unspecified COPD type (HCC)  Continue Trelegy Ellipta  >>> 1 puff daily in the morning >>>rinse mouth out after use  >>> This inhaler contains 3 medications that help manage her respiratory status, contact our office if you cannot afford this medication or unable to remain on this medication  Note your daily symptoms > remember "red flags" for COPD:   >>>Increase in cough >>>increase in sputum production >>>increase in shortness of breath or activity  intolerance.   If you notice these symptoms, please call the office to be seen.    2. Chronic respiratory failure with hypoxia, on home O2 therapy (HCC)  Continue oxygen therapy as prescribed  >>>maintain oxygen saturations greater than 88 percent  >>>if unable to maintain oxygen saturations please contact the office  >>>do not smoke with oxygen  >>>can use nasal saline gel or nasal saline rinses to moisturize nose if oxygen causes dryness  3. Allergic rhinitis due to pollen, unspecified seasonality  - fluticasone (FLONASE) 50 MCG/ACT nasal spray; Place 1 spray into both nostrils daily.  Dispense: 16 g; Refill: 3  Continue Zyrtec daily  Start nasal saline rinses twice daily Use distilled water Shake well Get bottle lukewarm like a baby bottle   We recommend today:   Meds ordered this encounter  Medications  . fluticasone (FLONASE) 50 MCG/ACT nasal spray    Sig: Place 1 spray into both nostrils daily.    Dispense:  16 g    Refill:  3    Follow Up:    Return in about 3 months (around 12/27/2019), or if symptoms worsen or fail to improve, for Follow up with [name].   Please do your part to reduce the spread of COVID-19:      Reduce your risk of any infection  and COVID19 by using the similar precautions used for avoiding the common cold or flu:  Marland Kitchen Wash your hands often with soap and warm water for at least 20 seconds.  If  soap and water are not readily available, use an alcohol-based hand sanitizer with at least 60% alcohol.  . If coughing or sneezing, cover your mouth and nose by coughing or sneezing into the elbow areas of your shirt or coat, into a tissue or into your sleeve (not your hands). Langley Gauss A MASK when in public  . Avoid shaking hands with others and consider head nods or verbal greetings only. . Avoid touching your eyes, nose, or mouth with unwashed hands.  . Avoid close contact with people who are sick. . Avoid places or events with large numbers of people in one location, like concerts or sporting events. . If you have some symptoms but not all symptoms, continue to monitor at home and seek medical attention if your symptoms worsen. . If you are having a medical emergency, call 911.   [place] / e-Visit: [email or web address]         [facility]: [facility]: [id number]                   [facility]: [id number]     It is flu season:   >>> Best ways to protect herself from the flu: Receive the yearly flu vaccine, practice good hand hygiene washing with soap and also using hand sanitizer when available, eat a nutritious  meals, get adequate rest, hydrate appropriately   Please contact the office if your symptoms worsen or you have concerns that you are not improving.   Thank you for choosing [facility] for your healthcare, and for allowing [place] to partner with you on your healthcare journey. I am thankful to be able to provide care to you today.   Wyn Quaker FNP-C    COPD and Physical Activity Chronic obstructive pulmonary disease (COPD) is a long-term (chronic) condition that affects the lungs. COPD is a general term that can be used to describe many different lung problems that cause lung swelling (inflammation) and limit  airflow, including chronic bronchitis and emphysema. The main symptom of COPD is shortness of breath, which makes it harder to do even simple tasks. This can also make it harder to exercise and be active. Talk with your health care provider about treatments to help you breathe better and actions you can take to prevent breathing problems during physical activity. What are the benefits of exercising with COPD? Exercising regularly is an important part of a healthy lifestyle. You can still exercise and do physical activities even though you have COPD. Exercise and physical activity improve your shortness of breath by increasing blood flow (circulation). This causes your heart to pump more oxygen through your body. Moderate exercise can improve your:  Oxygen use.  Energy level.  Shortness of breath.  Strength in your breathing muscles.  Heart health.  Sleep.  Self-esteem and feelings of self-worth.  Depression, stress, and anxiety levels. Exercise can benefit everyone with COPD. The severity of your disease may affect how hard you can exercise, especially at first, but everyone can benefit. Talk with your health care provider about how much exercise is safe for you, and which activities and exercises are safe for you. What actions can I take to prevent breathing problems during physical activity?  Sign up for a pulmonary rehabilitation program. This type of program may include: ? Education about lung diseases. ? Exercise classes that teach you how to exercise and be more active while improving your breathing. This usually involves:  Exercise using your lower extremities, such as a stationary bicycle.  About 30 minutes of exercise, 2 to 5 times per week, for 6 to 12 weeks  Strength training, such as push ups or leg lifts. ? Nutrition education. ? Group classes in which you can talk with others who also have COPD and learn ways to manage stress.  If you use an oxygen tank, you should  use it while you exercise. Work with your health care provider to adjust your oxygen for your physical activity. Your resting flow rate is different from your flow rate during physical activity.  While you are exercising: ? Take slow breaths. ? Pace yourself and do not try to go too fast. ? Purse your lips while breathing out. Pursing your lips is similar to a kissing or whistling position. ? If doing exercise that uses a quick burst of effort, such as weight lifting:  Breathe in before starting the exercise.  Breathe out during the hardest part of the exercise (such as raising the weights). Where to find support You can find support for exercising with COPD from:  Your health care provider.  A pulmonary rehabilitation program.  Your local health department or community health programs.  Support groups, online or in-person. Your health care provider may be able to recommend support groups. Where to find more information You can find more information  about exercising with COPD from:  American Lung Association: ClassInsider.se.  COPD Foundation: https://www.rivera.net/. Contact a health care provider if:  Your symptoms get worse.  You have chest pain.  You have nausea.  You have a fever.  You have trouble talking or catching your breath.  You want to start a new exercise program or a new activity. Summary  COPD is a general term that can be used to describe many different lung problems that cause lung swelling (inflammation) and limit airflow. This includes chronic bronchitis and emphysema.  Exercise and physical activity improve your shortness of breath by increasing blood flow (circulation). This causes your heart to provide more oxygen to your body.  Contact your health care provider before starting any exercise program or new activity. Ask your health care provider what exercises and activities are safe for you. This information is not intended to replace advice given to you by  your health care provider. Make sure you discuss any questions you have with your health care provider. Document Revised: 08/14/2018 Document Reviewed: 05/17/2017 Elsevier Patient Education  2020 [employer].

## 2019-09-26 NOTE — Assessment & Plan Note (Signed)
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

## 2019-09-26 NOTE — Assessment & Plan Note (Signed)
Atopic profile Peripheral eosinophilia on lab work Symptoms have improved since starting Zyrtec as well as Flonase  Plan: Continue Zyrtec Continue Flonase Start nasal saline rinses twice daily

## 2019-10-02 ENCOUNTER — Telehealth: Payer: Self-pay | Admitting: Emergency Medicine

## 2019-10-02 NOTE — Telephone Encounter (Signed)
It sounds like he needs an OV with RB or APP to eval

## 2019-10-02 NOTE — Telephone Encounter (Signed)
Called and spoke with pt's sister [name] who stated pt has not had any energy x1 month now. [name] stated she is unsure if pt is using this as an excuse due to pt's breathing and due to his breathing, pt is now sitting around doing nothing.  [name] said things are getting so bad that pt is now not bathing, wearing the same clothes for weeks without changing them.  [name] stated that pt is using trelegy inhaler as prescribed. She also states that pt has had to use the nebulizer and has used that at least three times daily. Pt is still wearing his O2 at 3L but she said when pt is ambulatory, he will bump O2 up to 4L.  [name] wants to know what we recommend. [name], please advise.

## 2019-10-02 NOTE — Telephone Encounter (Signed)
Called and left message for [name], patient's brother.

## 2019-10-02 NOTE — Telephone Encounter (Signed)
Called and spoke with patient's sister [name] she states that he is not doing anything and feels like he has completely given up. She states he does not bathe, he will wear the same clothes for a week or two and is not doing anything to help himself. Appetite is good for the most part depending on the day. Patient has a couple good days and then the rest of the week he states he just does not have any energy. Patient had an appointment with Aaron Edelman on 5/21.  Sister is wondering what her options are as far as home health for the patient. She states she just doesn't know what to do and as his sister feels like she can only do/say so much without him getting mad.  Aaron Edelman please advise with any recommendations. Sent previous message to [name] he recommended an OV which he just had.

## 2019-10-02 NOTE — Telephone Encounter (Signed)
[name] sister is returning phone call. [name] phone number is [phone].

## 2019-10-03 NOTE — Telephone Encounter (Signed)
Left message for patient's sister to call back.

## 2019-10-03 NOTE — Telephone Encounter (Signed)
Agree with [name] recommendations.  Unfortunately as the patient sister is aware Mr. Hacker is in his right mind and is coherent to make his own decisions.  When we have discussed in the past more supportive help such as home health or palliative care he was not interested.We can always reevaluate these things.  I would also caution to make sure that Mr. Demers is aware that she has these concerns.  This would need to be an in office visit.  Would favor an appointment with [name] given the fact that I have seen the patient multiple times.  If there is no available appointment slots that fits the patient schedule I am more than happy to see them.  Please also make sure that he is scheduled for a follow-up with [name] in 4 to 8 weeks following the appointment scheduled with me.  To ensure we can get back on track with APP and MD scheduling.Wyn Quaker, FNP

## 2019-10-05 ENCOUNTER — Emergency Department (HOSPITAL_COMMUNITY)
Admission: EM | Admit: 2019-10-05 | Discharge: 2019-10-05 | Disposition: A | Discharge status: Home / Self Care | Payer: Medicare Other | Attending: Emergency Medicine | Admitting: Emergency Medicine

## 2019-10-05 ENCOUNTER — Encounter (HOSPITAL_COMMUNITY): Payer: Self-pay | Admitting: Emergency Medicine

## 2019-10-05 ENCOUNTER — Emergency Department (HOSPITAL_COMMUNITY): Payer: Medicare Other

## 2019-10-05 DIAGNOSIS — Z79899 Other long term (current) drug therapy: Secondary | ICD-10-CM | POA: Diagnosis not present

## 2019-10-05 DIAGNOSIS — R0602 Shortness of breath: Secondary | ICD-10-CM | POA: Diagnosis not present

## 2019-10-05 DIAGNOSIS — R0902 Hypoxemia: Secondary | ICD-10-CM | POA: Diagnosis not present

## 2019-10-05 DIAGNOSIS — Z87891 Personal history of nicotine dependence: Secondary | ICD-10-CM | POA: Insufficient documentation

## 2019-10-05 DIAGNOSIS — Z20822 Contact with and (suspected) exposure to covid-19: Secondary | ICD-10-CM | POA: Diagnosis not present

## 2019-10-05 DIAGNOSIS — R05 Cough: Secondary | ICD-10-CM | POA: Diagnosis not present

## 2019-10-05 DIAGNOSIS — I4510 Unspecified right bundle-branch block: Secondary | ICD-10-CM | POA: Diagnosis not present

## 2019-10-05 DIAGNOSIS — J441 Chronic obstructive pulmonary disease with (acute) exacerbation: Secondary | ICD-10-CM | POA: Diagnosis not present

## 2019-10-05 DIAGNOSIS — R5383 Other fatigue: Secondary | ICD-10-CM | POA: Diagnosis not present

## 2019-10-05 DIAGNOSIS — R531 Weakness: Secondary | ICD-10-CM | POA: Diagnosis not present

## 2019-10-05 LAB — POCT I-STAT EG7
Acid-Base Excess: 8 mmol/L — ABNORMAL HIGH (ref 0.0–2.0)
Bicarbonate: 34.4 mmol/L — ABNORMAL HIGH (ref 20.0–28.0)
Calcium, Ion: 1.05 mmol/L — ABNORMAL LOW (ref 1.15–1.40)
HCT: 42 % (ref 39.0–52.0)
Hemoglobin: 14.3 g/dL (ref 13.0–17.0)
O2 Saturation: 87 %
Potassium: 4.7 mmol/L (ref 3.5–5.1)
Sodium: 140 mmol/L (ref 135–145)
TCO2: 36 mmol/L — ABNORMAL HIGH (ref 22–32)
pCO2, Ven: 51.3 mmHg (ref 44.0–60.0)
pH, Ven: 7.435 — ABNORMAL HIGH (ref 7.250–7.430)
pO2, Ven: 52 mmHg — ABNORMAL HIGH (ref 32.0–45.0)

## 2019-10-05 LAB — CBC
HCT: 47.6 % (ref 39.0–52.0)
Hemoglobin: 15.2 g/dL (ref 13.0–17.0)
MCH: 28.4 pg (ref 26.0–34.0)
MCHC: 31.9 g/dL (ref 30.0–36.0)
MCV: 89 fL (ref 80.0–100.0)
Platelets: 292 10*3/uL (ref 150–400)
RBC: 5.35 MIL/uL (ref 4.22–5.81)
RDW: 12.1 % (ref 11.5–15.5)
WBC: 10.6 10*3/uL — ABNORMAL HIGH (ref 4.0–10.5)
nRBC: 0 % (ref 0.0–0.2)

## 2019-10-05 LAB — BASIC METABOLIC PANEL
Anion gap: 10 (ref 5–15)
BUN: 18 mg/dL (ref 8–23)
CO2: 32 mmol/L (ref 22–32)
Calcium: 9.6 mg/dL (ref 8.9–10.3)
Chloride: 97 mmol/L — ABNORMAL LOW (ref 98–111)
Creatinine, Ser: 0.92 mg/dL (ref 0.61–1.24)
GFR calc Af Amer: >60 mL/min (ref >60)
GFR calc non Af Amer: >60 mL/min (ref >60)
Glucose, Bld: 96 mg/dL (ref 70–99)
Potassium: 4.7 mmol/L (ref 3.5–5.1)
Sodium: 139 mmol/L (ref 135–145)

## 2019-10-05 LAB — SARS CORONAVIRUS 2 BY RT PCR (HOSPITAL ORDER, PERFORMED IN ~~LOC~~ HOSPITAL LAB): SARS Coronavirus 2: NEGATIVE

## 2019-10-05 LAB — TROPONIN I (HIGH SENSITIVITY)
Troponin I (High Sensitivity): 3 ng/L (ref <18)
Troponin I (High Sensitivity): <2 ng/L (ref <18)

## 2019-10-05 MED ORDER — AZITHROMYCIN 250 MG PO TABS
250.0000 mg | ORAL_TABLET | Freq: Every day | ORAL | 0 refills | Status: DC
Start: 2019-10-05 — End: 2019-11-20

## 2019-10-05 MED ORDER — ALBUTEROL SULFATE HFA 108 (90 BASE) MCG/ACT IN AERS
2.0000 | INHALATION_SPRAY | Freq: Once | RESPIRATORY_TRACT | Status: AC
  Administered 2019-10-05: 2 via RESPIRATORY_TRACT
  Filled 2019-10-05: qty 6.7

## 2019-10-05 MED ORDER — METHYLPREDNISOLONE SODIUM SUCC 125 MG IJ SOLR
125.0000 mg | Freq: Once | INTRAMUSCULAR | Status: AC
  Administered 2019-10-05: 125 mg via INTRAVENOUS
  Filled 2019-10-05: qty 2

## 2019-10-05 MED ORDER — PREDNISONE 10 MG PO TABS
40.0000 mg | ORAL_TABLET | Freq: Every day | ORAL | 0 refills | Status: DC
Start: 2019-10-05 — End: 2019-11-20

## 2019-10-05 NOTE — ED Provider Notes (Signed)
[place] EMERGENCY DEPARTMENT Provider Note   CSN: [id number] Arrival date & time: 10/05/19  [id number]     History Chief Complaint  Patient presents with  . Fatigue    Frank [name] is a 64 y.o. male.  The history is provided by the patient and medical records. No language interpreter was used.   Frank [name] is a 64 y.o. male who presents to the Emergency Department complaining of shortness of breath and fatigue. He presents the emergency department by EMS complaining of progressive generalized weakness, fatigue, malaise and shortness of breath for the last week. He has a history of COPD and is on 3 L nasal cannula oxygen at baseline. About one week ago one of his COPD medications was changed. He has associated poor appetite, fatigue and malaise. He denies any chest pain, fevers, edema, abdominal pain, vomiting, diarrhea.    Past Medical History:  Diagnosis Date  . COPD (chronic obstructive pulmonary disease) ([place]) 06/05/2012  . Dyspnea   . Dysrhythmia   . Emphysema 06/07/2012  . Heart murmur    hx of as baby  . History of kidney stones   . Hypoxemia 06/07/2012  . Obstructive chronic bronchitis with exacerbation ([place]) 06/07/2012  . Pneumonia   . Tobacco abuse 06/05/2012    Patient Active Problem List   Diagnosis Date Noted  . Physical deconditioning 08/29/2019  . Decreased appetite 07/08/2019  . Healthcare maintenance 07/08/2019  . Allergic rhinitis 05/30/2017  . COPD (chronic obstructive pulmonary disease) ([place]) 11/27/2016  . Advance care planning 10/23/2016  . Diverticulosis of colon 06/09/2016  . History of colonic polyps 06/17/2014  . Benign prostatic hyperplasia (BPH) with straining on urination 06/12/2014  . Chronic respiratory failure with hypoxia, on home O2 therapy ([place]) 06/05/2012    Past Surgical History:  Procedure Laterality Date  . CATARACT EXTRACTION, BILATERAL    . CYSTOSCOPY/RETROGRADE/URETEROSCOPY/STONE EXTRACTION WITH BASKET Right  10/04/2016   Procedure: CYSTOSCOPY/RETROGRADE/URETEROSCOPY/STONE EXTRACTION WITH BASKET/ STENT;  Surgeon: Alexis Frock, MD;  Location: [facility];  Service: Urology;  Laterality: Right;  With LASER  . EXTRACORPOREAL SHOCK WAVE LITHOTRIPSY Right 06/29/2016   Procedure: RIGHT EXTRACORPOREAL SHOCK WAVE LITHOTRIPSY (ESWL);  Surgeon: Ardis Hughs, MD;  Location: [facility];  Service: Urology;  Laterality: Right;       Family History  Problem Relation Age of Onset  . Colon polyps Father   . Heart attack Father 31  . Colon polyps Sister     Social History   Tobacco Use  . Smoking status: Former Smoker    Packs/day: 0.50    Years: 35.00    Pack years: 17.50    Types: Cigarettes    Quit date: 05/11/2015    Years since quitting: 4.4  . Smokeless tobacco: Former Systems developer    Types: Chew    Quit date: 05/08/1968  Substance Use Topics  . Alcohol use: No    Alcohol/week: 0.0 standard drinks  . Drug use: No    Home Medications Prior to Admission medications   Medication Sig Start Date End Date Taking? Authorizing Provider  albuterol (ACCUNEB) 0.63 MG/3ML nebulizer solution USE 1 VIAL IN NEBULIZER EVERY 6 HOURS 09/01/19   Collene Gobble, MD  albuterol (VENTOLIN HFA) 108 (90 Base) MCG/ACT inhaler INHALE 2 PUFFS BY MOUTH EVERY 4 HOURS AS NEEDED FOR WHEEZING OR SHORTNESS OF BREATH 07/28/19   Collene Gobble, MD  azithromycin (ZITHROMAX) 250 MG tablet Take 1 tablet (250 mg total) by mouth daily. Take first  2 tablets together, then 1 every day until finished. 10/05/19   Quintella Reichert, MD  fluticasone [facility]) 50 MCG/ACT nasal spray Place 1 spray into both nostrils daily. 09/26/19   Lauraine Rinne, NP  Fluticasone-Umeclidin-Vilant (TRELEGY ELLIPTA) 100-62.5-25 MCG/INH AEPB Inhale 1 puff into the lungs daily. 09/26/19   Lauraine Rinne, NP  OXYGEN Inhale 3 L into the lungs continuous.     [provider]  predniSONE (DELTASONE) 10 MG tablet Take 4 tablets (40 mg total) by mouth daily. 10/05/19   Quintella Reichert, MD  umeclidinium-vilanterol [facility] ELLIPTA) 62.5-25 MCG/INH AEPB INHALE 1 PUFF BY MOUTH INTO THE LUNGS EVERY DAY 06/25/19   Collene Gobble, MD    Allergies    Patient has no known allergies.  Review of Systems   Review of Systems  All other systems reviewed and are negative.   Physical Exam Updated Vital Signs BP 129/79   Pulse 86   Temp 97.8 F (36.6 C) (Oral)   Resp (!) 28   Ht 5\' 7"  (1.702 m)   Wt 73 kg   SpO2 95%   BMI 25.22 kg/m   Physical Exam Vitals and nursing note reviewed.  Constitutional:      Appearance: He is well-developed. He is ill-appearing.  HENT:     Head: Normocephalic and atraumatic.  Cardiovascular:     Rate and Rhythm: Normal rate and regular rhythm.     Heart sounds: No murmur.  Pulmonary:     Effort: Pulmonary effort is normal. No respiratory distress.     Comments: Diffuse wheezes Abdominal:     Palpations: Abdomen is soft.     Tenderness: There is no abdominal tenderness. There is no guarding or rebound.  Musculoskeletal:        General: No swelling or tenderness.  Skin:    General: Skin is warm and dry.  Neurological:     Mental Status: He is alert and oriented to person, place, and time.  Psychiatric:        Behavior: Behavior normal.     ED Results / Procedures / Treatments   Labs (all labs ordered are listed, but only abnormal results are displayed) Labs Reviewed  BASIC METABOLIC PANEL - Abnormal; Notable for the following components:      Result Value   Chloride 97 (*)    All other components within normal limits  CBC - Abnormal; Notable for the following components:   WBC 10.6 (*)    All other components within normal limits  POCT I-STAT EG7 - Abnormal; Notable for the following components:   pH, Ven 7.435 (*)    pO2, Ven 52.0 (*)    Bicarbonate 34.4 (*)    TCO2 36 (*)    Acid-Base Excess 8.0 (*)    Calcium, Ion 1.05 (*)    All other components within normal limits  SARS CORONAVIRUS 2 BY RT PCR (HOSPITAL  ORDER, [place] [facility])  I-STAT VENOUS BLOOD GAS, ED  TROPONIN I (HIGH SENSITIVITY)  TROPONIN I (HIGH SENSITIVITY)    EKG EKG Interpretation  Date/Time:  Sunday Oct 05 2019 09:34:46 EDT Ventricular Rate:  86 PR Interval:  152 QRS Duration: 122 QT Interval:  380 QTC Calculation: 454 R Axis:   -140 Text Interpretation: Normal sinus rhythm Right bundle branch block Possible Inferior infarct , age undetermined Abnormal ECG Confirmed by Quintella Reichert [phone]) on 10/05/2019 9:47:35 AM   Radiology DG Chest 2 View  Result Date: 10/05/2019 CLINICAL DATA:  Cough and shortness of breath EXAM: CHEST - 2 VIEW COMPARISON:  July 08, 2019 FINDINGS: There is upper lobe bullous disease with scarring in the left upper lobe region. There is atelectatic change in the right base. Lungs elsewhere are clear. Heart size is normal. Pulmonary vascularity reflects the underlying emphysematous change with diminished vascularity in the upper lobes, a stable finding. No adenopathy. No bone lesions. 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 Atelectatic change right base. No edema or airspace opacity. Heart size normal. No evident adenopathy. Electronically Signed   By: Lowella Grip III M.D.   On: 10/05/2019 10:12    Procedures Procedures (including critical care time)  Medications Ordered in ED Medications  methylPREDNISolone sodium succinate (SOLU-MEDROL) 125 mg/2 mL injection 125 mg (125 mg Intravenous Given 10/05/19 1156)  albuterol (VENTOLIN HFA) 108 (90 Base) MCG/ACT inhaler 2 puff (2 puffs Inhalation Given 10/05/19 1152)    ED Course  I have reviewed the triage vital signs and the nursing notes.  Pertinent labs & imaging results that were available during my care of the patient were reviewed by me and considered in my medical decision making (see chart for details).    MDM Rules/Calculators/A&P                     patient with history of COPD here  for evaluation of increased shortness of breath, malaise and fatigue. He is chronically ill appearing on evaluation but in no acute distress. On initial assessment he did have wheezing, he was treated with albuterol, steroids. On repeat assessment he is feeling improved. He does have some persistent wheezing but air movement is improved on exam. Chest x-ray without evidence of pneumonia. Presentation is not consistent with ACS, CHF, PE. Given symptoms improved after treatment in the department will discharge home with home care for COPD exacerbation. Will provide prednisone burst, Zpak. Also discussed with patient changing back to his previous inhaler because his worsening symptoms started at the time that he changes inhaler. Treatment plan was also discussed with the patient's wife at the bedside. Patient and wife are in agreement with plan. Discussed importance of return precautions if he worsens.  Final Clinical Impression(s) / ED Diagnoses Final diagnoses:  COPD with acute exacerbation ([place])    Rx / DC Orders ED Discharge Orders         Ordered    predniSONE (DELTASONE) 10 MG tablet  Daily     10/05/19 1405    azithromycin (ZITHROMAX) 250 MG tablet  Daily     10/05/19 1405           Quintella Reichert, MD 10/05/19 1630

## 2019-10-05 NOTE — ED Triage Notes (Addendum)
Pt arrives via [facility] from home where he lives with his sister who called EMS for c/o generalized fatigue and worsening SOB with exertion over the past week. Hx of copd, wears 3L O2 at rest and 4L O2 during activity. Denies any edema or chest pain. 20g IV L hand. resp e/u, nad.

## 2019-10-05 NOTE — ED Notes (Signed)
Patient verbalizes understanding of discharge instructions. Opportunity for questioning and answers were provided. Armband removed by staff, pt discharged from ED to home with sister

## 2019-10-07 NOTE — Telephone Encounter (Signed)
Left message for Frank [name] to call back.  

## 2019-10-08 DIAGNOSIS — J449 Chronic obstructive pulmonary disease, unspecified: Secondary | ICD-10-CM | POA: Diagnosis not present

## 2019-10-08 NOTE — Telephone Encounter (Signed)
I called [name] but there was no answer. I left a VM to call back. Pt has an appt with RB on 10/29/2019.

## 2019-10-10 ENCOUNTER — Telehealth: Payer: Self-pay | Admitting: Emergency Medicine

## 2019-10-10 NOTE — Telephone Encounter (Signed)
Spoke with pt's sister and advised message from [place]. Appt with [name] on 10/15/2019 at 4pm. Nothing further is needed.

## 2019-10-14 DIAGNOSIS — J449 Chronic obstructive pulmonary disease, unspecified: Secondary | ICD-10-CM | POA: Diagnosis not present

## 2019-10-15 ENCOUNTER — Ambulatory Visit: Payer: Medicare Other | Admitting: Emergency Medicine

## 2019-10-15 ENCOUNTER — Other Ambulatory Visit: Payer: Self-pay

## 2019-10-15 ENCOUNTER — Encounter: Payer: Self-pay | Admitting: Emergency Medicine

## 2019-10-15 DIAGNOSIS — J449 Chronic obstructive pulmonary disease, unspecified: Secondary | ICD-10-CM

## 2019-10-15 DIAGNOSIS — Z9981 Dependence on supplemental oxygen: Secondary | ICD-10-CM | POA: Diagnosis not present

## 2019-10-15 DIAGNOSIS — J301 Allergic rhinitis due to pollen: Secondary | ICD-10-CM | POA: Diagnosis not present

## 2019-10-15 DIAGNOSIS — J9611 Chronic respiratory failure with hypoxia: Secondary | ICD-10-CM

## 2019-10-15 MED ORDER — PREDNISONE 10 MG PO TABS
10.0000 mg | ORAL_TABLET | Freq: Every day | ORAL | 0 refills | Status: DC
Start: 2019-10-15 — End: 2019-11-20

## 2019-10-15 NOTE — Patient Instructions (Addendum)
Please continue Trelegy 1 inhalation once daily.  Rinse and gargle after using. Keep your albuterol available to use either 2 puffs or 1 nebulizer treatment up to every 4 hours if needed for shortness of breath, chest tightness, wheezing. Continue your oxygen at 3-4 L/min.  We want you to be on 4 L/min whenever you are up walking around.  This may mean that you need to start leaving it on 4 L/min most of the time. Please start prednisone 10 mg once daily every day until next visit. Please continue Zyrtec 10 mg once daily. Please continue fluticasone nasal spray 2 sprays each nostril once daily. Please start doing your nasal saline rinses every day until next visit. Please start taking your Mucinex 600 mg once daily every day Follow with Dr Lamonte Sakai in 1 month

## 2019-10-15 NOTE — Assessment & Plan Note (Signed)
Discussed insuring that his SpO2 stays > 90%, titration to 4L/min.

## 2019-10-15 NOTE — Assessment & Plan Note (Signed)
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  We will try to treat more aggressively.  Please continue Zyrtec 10 mg once daily. Please continue fluticasone nasal spray 2 sprays each nostril once daily. Please start doing your nasal saline rinses every day until next visit. Please start taking your Mucinex 600 mg once daily every day

## 2019-10-15 NOTE — Assessment & Plan Note (Signed)
With persistent symptoms, significant limitation in his functional capacity.  This despite maximal bronchodilator therapy, supplemental oxygen.  Unclear that he is adequately oxygenated at all times, they have seen some desaturations.  I would like to increase him to 4 L/min unless he is certain he is going to be at rest, then he can be at 3 L/min.  A minute and low-dose prednisone 10 mg daily to see if he gets benefit.  Continue his Trelegy, albuterol. Add scheduled mucinex  Please continue Trelegy 1 inhalation once daily.  Rinse and gargle after using. Keep your albuterol available to use either 2 puffs or 1 nebulizer treatment up to every 4 hours if needed for shortness of breath, chest tightness, wheezing. Continue your oxygen at 3-4 L/min.  We want you to be on 4 L/min whenever you are up walking around.  This may mean that you need to start leaving it on 4 L/min most of the time. Please start prednisone 10 mg once daily every day until next visit. Follow with Dr Lamonte Sakai in 1 month

## 2019-10-15 NOTE — Progress Notes (Signed)
Subjective:    Patient ID: Frank [name], male    DOB: February 10, 1956, 64 y.o.   MRN: [id number]  COPD His past medical history is significant for COPD.    ROV 10/15/19 --64 year old man with severe COPD and associated chronic hypoxemic respiratory failure on 3 to 4 L/min.  He was changed to Trelegy earlier this year.  Also with chronic rhinitis, allergies, taking fluticasone nasal spray, Zyrtec, on mucinex prn, [place] prn. He was in the ED 5/30, treated for an acute exacerbation with pred and abx. Unsure whether he benefited. He is c/o persistent severe naso-sinus obstruction and congestion. His family has seen him confused especially when he wakes up from sleep. He has been having transient desaturations on 3L/min at rest.    MDM: Reviewed ED notes 10/05/19 Reviewed CXR 10/05/19    PULMONARY FUNCTON TEST 08/30/2012  FVC 3.93  FEV1 1.69  FEV1/FVC 43  FVC  % Predicted 93  FEV % Predicted 55  FeF 25-75 .71  FeF 25-75 % Predicted 3.05    Review of Systems As per HPI     Objective:   Physical Exam Vitals:   10/15/19 1604  BP: 128/72  Pulse: 100  Temp: 98.5 F (36.9 C)  TempSrc: Oral  SpO2: 98%  Weight: 156 lb 3.2 oz (70.9 kg)  Height: 5\' 7"  (1.702 m)   Gen: Pleasant, well-nourished, in no distress,  normal affect  ENT: No lesions,  mouth clear,  oropharynx clear, no postnasal drip  Neck: No JVD, no stridor  Lungs: No use of accessory muscles, distant, scattered B wheezes.   Cardiovascular: RRR, heart sounds normal, no murmur or gallops, no peripheral edema  Musculoskeletal: No deformities, no cyanosis or clubbing  Neuro: alert, non focal  Skin: Warm, no lesions or rashes     Assessment & Plan:  COPD (chronic obstructive pulmonary disease) (HCC) With persistent symptoms, significant limitation in his functional capacity.  This despite maximal bronchodilator therapy, supplemental oxygen.  Unclear that he is adequately oxygenated at all times, they have seen some  desaturations.  I would like to increase him to 4 L/min unless he is certain he is going to be at rest, then he can be at 3 L/min.  A minute and low-dose prednisone 10 mg daily to see if he gets benefit.  Continue his Trelegy, albuterol. Add scheduled mucinex  Please continue Trelegy 1 inhalation once daily.  Rinse and gargle after using. Keep your albuterol available to use either 2 puffs or 1 nebulizer treatment up to every 4 hours if needed for shortness of breath, chest tightness, wheezing. Continue your oxygen at 3-4 L/min.  We want you to be on 4 L/min whenever you are up walking around.  This may mean that you need to start leaving it on 4 L/min most of the time. Please start prednisone 10 mg once daily every day until next visit. Follow with Dr Lamonte Sakai in 1 month  Allergic rhinitis Nasal sinus obstruction C3 1 was biggest complaints and it extends to his other problems as well, contributes to his overall dyspnea.  We will try to treat more aggressively.  Please continue Zyrtec 10 mg once daily. Please continue fluticasone nasal spray 2 sprays each nostril once daily. Please start doing your nasal saline rinses every day until next visit. Please start taking your Mucinex 600 mg once daily every day   Chronic respiratory failure with hypoxia, on home O2 therapy ([place]) Discussed insuring that his SpO2 stays > 90%, titration to  4L/min.   [name], MD, PhD 10/15/2019, 5:29 PM [facility] [phone] or if no answer [phone]

## 2019-10-24 ENCOUNTER — Encounter: Payer: Self-pay | Admitting: *Deleted

## 2019-10-29 ENCOUNTER — Ambulatory Visit: Payer: Medicare Other | Admitting: Emergency Medicine

## 2019-10-29 ENCOUNTER — Telehealth: Payer: Self-pay | Admitting: Internal Medicine

## 2019-10-29 NOTE — Telephone Encounter (Signed)
Pls contact pt sister [phone]

## 2019-11-03 ENCOUNTER — Encounter: Payer: Self-pay | Admitting: *Deleted

## 2019-11-05 ENCOUNTER — Ambulatory Visit: Payer: Medicare Other | Admitting: Licensed Clinical Social Worker

## 2019-11-07 DIAGNOSIS — J449 Chronic obstructive pulmonary disease, unspecified: Secondary | ICD-10-CM | POA: Diagnosis not present

## 2019-11-18 ENCOUNTER — Telehealth: Payer: Self-pay | Admitting: Licensed Clinical Social Worker

## 2019-11-18 NOTE — Telephone Encounter (Signed)
Patient was called back due to a request. Patient was unsure about why his sister requested a call back for him. Patient reported he was doing very well, and he did not need any counseling services at this time.

## 2019-11-20 ENCOUNTER — Other Ambulatory Visit: Payer: Self-pay

## 2019-11-20 ENCOUNTER — Ambulatory Visit: Payer: Medicare Other | Admitting: Emergency Medicine

## 2019-11-20 ENCOUNTER — Encounter: Payer: Self-pay | Admitting: Emergency Medicine

## 2019-11-20 DIAGNOSIS — Z9981 Dependence on supplemental oxygen: Secondary | ICD-10-CM | POA: Diagnosis not present

## 2019-11-20 DIAGNOSIS — J449 Chronic obstructive pulmonary disease, unspecified: Secondary | ICD-10-CM | POA: Diagnosis not present

## 2019-11-20 DIAGNOSIS — J9611 Chronic respiratory failure with hypoxia: Secondary | ICD-10-CM

## 2019-11-20 DIAGNOSIS — J301 Allergic rhinitis due to pollen: Secondary | ICD-10-CM | POA: Diagnosis not present

## 2019-11-20 MED ORDER — ALBUTEROL SULFATE HFA 108 (90 BASE) MCG/ACT IN AERS: INHALATION_SPRAY | RESPIRATORY_TRACT | 5 refills | Status: DC

## 2019-11-20 NOTE — Assessment & Plan Note (Signed)
Chronic rhinitis with chronic sinus obstruction.  This is improved, may have been impacted by the nasal saline rinses, increase in Mucinex frequency.  Also consider the benefit of the prednisone.  If he loses ground when the prednisone is stopped then we could add it back.  Please continue your fluticasone nasal spray, Zyrtec as you have been using them. Please continue nasal saline rinses every day Please continue your Mucinex every day as you have been taking it.

## 2019-11-20 NOTE — Assessment & Plan Note (Signed)
Continue 4 L/min at all times.

## 2019-11-20 NOTE — Patient Instructions (Signed)
Continue Trelegy 1 inhalation once daily.  Rinse and gargle after using. Keep your albuterol available to use 2 puffs up to every 4 hours if needed for shortness of breath, chest tightness, wheezing. Please continue your fluticasone nasal spray, Zyrtec as you have been using them. Please continue nasal saline rinses every day Please continue your Mucinex every day as you have been taking it. Stop prednisone for now.  Please call our office if you feel that you lose any ground off the prednisone.  We may decide to restart it. Continue your oxygen at 4 L/min COVID-19 vaccine is up-to-date Follow with Dr Lamonte Sakai in 3 months or sooner if you have any problems.

## 2019-11-20 NOTE — Assessment & Plan Note (Signed)
Severe COPD sometimes with symptoms even at rest.  Certainly limited with exertion.  He is not sure that his breathing changed very much on the low-dose prednisone although it may have impacted his sinus disease and cough.  Were going to take a trial off the prednisone to see if he misses it if so that would restart.  We may even decide to uptitrate.  We did broach the subject today of treating symptoms with a palliative approach in the event that we are still dealing with dyspnea even though we have optimize therapy.  He was receptive to this although he does not believe it is time for this kind of philosophy at this time.  Continue Trelegy 1 inhalation once daily.  Rinse and gargle after using. Keep your albuterol available to use 2 puffs up to every 4 hours if needed for shortness of breath, chest tightness, wheezing. Please continue your Mucinex every day as you have been taking it. Stop prednisone for now.  Please call our office if you feel that you lose any ground off the prednisone.  We may decide to restart it. COVID-19 vaccine is up-to-date Follow with Dr Lamonte Sakai in 3 months or sooner if you have any problems.

## 2019-11-20 NOTE — Progress Notes (Signed)
Subjective:    Patient ID: Frank [name], male    DOB: [birth year], 64 y.o.   MRN: [id number]  COPD His past medical history is significant for COPD.    ROV 10/15/19 --64 year old man with severe COPD and associated chronic hypoxemic respiratory failure on 3 to 4 L/min.  He was changed to Trelegy earlier this year.  Also with chronic rhinitis, allergies, taking fluticasone nasal spray, Zyrtec, on mucinex prn, [place] prn. He was in the ED 5/30, treated for an acute exacerbation with pred and abx. Unsure whether he benefited. He is c/o persistent severe naso-sinus obstruction and congestion. His family has seen him confused especially when he wakes up from sleep. He has been having transient desaturations on 3L/min at rest.    ROV 11/20/19 --follow-up visit for 64 year old man with severe COPD and associated chronic hypoxemic respiratory failure allergic rhinitis with nasal obstruction.  We have been managing him on Trelegy but he continued to have severe dyspnea, significant limitation.  We noted transient desaturations on 3 L/min at his last visit.  I increased his oxygen to 4 L/min last visit.  Also start prednisone 10 mg daily to see if he would get benefit from chronic steroids.  He added nasal saline rinses and scheduled Mucinex to his fluticasone nasal spray and Zyrtec. Today he reports that he cannot tell much difference in his breathing, he may be coughing less, a bit less mucous. His sinuses have cleared up some. He believes the [place] do help.  COVID vaccine up to date.     PULMONARY FUNCTON TEST 08/30/2012  FVC 3.93  FEV1 1.69  FEV1/FVC 43  FVC  % Predicted 93  FEV % Predicted 55  FeF 25-75 .71  FeF 25-75 % Predicted 3.05    Review of Systems As per HPI     Objective:   Physical Exam Vitals:   11/20/19 1331  BP: 122/68  Pulse: 97  Temp: 98.7 F (37.1 C)  SpO2: 98%  Weight: 164 lb 12.8 oz (74.8 kg)  Height: 5\' 7"  (1.702 m)   Gen: Pleasant, well-nourished, in no distress,   normal affect  ENT: No lesions,  mouth clear,  oropharynx clear, no postnasal drip  Neck: No JVD, no stridor  Lungs: No use of accessory muscles, distant, scattered B wheezes throughout exhalation.   Cardiovascular: RRR, heart sounds normal, no murmur or gallops, no peripheral edema  Musculoskeletal: No deformities, no cyanosis or clubbing  Neuro: alert, non focal  Skin: Warm, no lesions or rashes     Assessment & Plan:  COPD (chronic obstructive pulmonary disease) (HCC) Severe COPD sometimes with symptoms even at rest.  Certainly limited with exertion.  He is not sure that his breathing changed very much on the low-dose prednisone although it may have impacted his sinus disease and cough.  Were going to take a trial off the prednisone to see if he misses it if so that would restart.  We may even decide to uptitrate.  We did broach the subject today of treating symptoms with a palliative approach in the event that we are still dealing with dyspnea even though we have optimize therapy.  He was receptive to this although he does not believe it is time for this kind of philosophy at this time.  Continue Trelegy 1 inhalation once daily.  Rinse and gargle after using. Keep your albuterol available to use 2 puffs up to every 4 hours if needed for shortness of breath, chest tightness, wheezing. Please continue  your Mucinex every day as you have been taking it. Stop prednisone for now.  Please call our office if you feel that you lose any ground off the prednisone.  We may decide to restart it. COVID-19 vaccine is up-to-date Follow with Dr Lamonte Sakai in 3 months or sooner if you have any problems.  Chronic respiratory failure with hypoxia, on home O2 therapy (HCC) Continue 4 L/min at all times.  Allergic rhinitis Chronic rhinitis with chronic sinus obstruction.  This is improved, may have been impacted by the nasal saline rinses, increase in Mucinex frequency.  Also consider the benefit of the  prednisone.  If he loses ground when the prednisone is stopped then we could add it back.  Please continue your fluticasone nasal spray, Zyrtec as you have been using them. Please continue nasal saline rinses every day Please continue your Mucinex every day as you have been taking it.  [name], MD, PhD 11/20/2019, 1:50 PM [facility] [phone] or if no answer [phone]

## 2019-12-08 DIAGNOSIS — J449 Chronic obstructive pulmonary disease, unspecified: Secondary | ICD-10-CM | POA: Diagnosis not present

## 2019-12-24 ENCOUNTER — Telehealth: Payer: Self-pay | Admitting: Emergency Medicine

## 2019-12-24 DIAGNOSIS — J449 Chronic obstructive pulmonary disease, unspecified: Secondary | ICD-10-CM

## 2019-12-24 DIAGNOSIS — Z9981 Dependence on supplemental oxygen: Secondary | ICD-10-CM

## 2019-12-24 DIAGNOSIS — J9611 Chronic respiratory failure with hypoxia: Secondary | ICD-10-CM

## 2019-12-24 NOTE — Telephone Encounter (Signed)
Need to call back 8/19 in the morning as the office is closed.

## 2019-12-25 NOTE — Telephone Encounter (Signed)
Order placed,  Called sister to let her know this was done Nothing further needed at this time.

## 2019-12-25 NOTE — Telephone Encounter (Signed)
Called and spoke to [facility] with [facility]. Pt is requesting home palliative care.   [name] please advise if ok to place order for [facility] home palliative care. Thanks.

## 2019-12-25 NOTE — Telephone Encounter (Signed)
Yes I agree, please order

## 2020-01-08 DIAGNOSIS — J449 Chronic obstructive pulmonary disease, unspecified: Secondary | ICD-10-CM | POA: Diagnosis not present

## 2020-01-26 ENCOUNTER — Telehealth: Payer: Self-pay | Admitting: Emergency Medicine

## 2020-01-26 DIAGNOSIS — J449 Chronic obstructive pulmonary disease, unspecified: Secondary | ICD-10-CM

## 2020-01-26 NOTE — Telephone Encounter (Signed)
OK to order a flutter valve

## 2020-01-26 NOTE — Telephone Encounter (Signed)
Primary Pulmonologist: Dr.[name] Last office visit and with whom: 11/20/2019 Dr.[name] What do we see them for (pulmonary problems): COPD  Last OV assessment/plan:  COPD (chronic obstructive pulmonary disease) (HCC) Severe COPD sometimes with symptoms even at rest.  Certainly limited with exertion.  He is not sure that his breathing changed very much on the low-dose prednisone although it may have impacted his sinus disease and cough.  Were going to take a trial off the prednisone to see if he misses it if so that would restart.  We may even decide to uptitrate.  We did broach the subject today of treating symptoms with a palliative approach in the event that we are still dealing with dyspnea even though we have optimize therapy.  He was receptive to this although he does not believe it is time for this kind of philosophy at this time.  Continue Trelegy 1 inhalation once daily.  Rinse and gargle after using. Keep your albuterol available to use 2 puffs up to every 4 hours if needed for shortness of breath, chest tightness, wheezing. Please continue your Mucinex every day as you have been taking it. Stop prednisone for now.  Please call our office if you feel that you lose any ground off the prednisone.  We may decide to restart it. COVID-19 vaccine is up-to-date Follow with Dr Lamonte Sakai in 3 months or sooner if you have any problems.  Chronic respiratory failure with hypoxia, on home O2 therapy (HCC) Continue 4 L/min at all times.  Allergic rhinitis Chronic rhinitis with chronic sinus obstruction.  This is improved, may have been impacted by the nasal saline rinses, increase in Mucinex frequency.  Also consider the benefit of the prednisone.  If he loses ground when the prednisone is stopped then we could add it back.  Please continue your fluticasone nasal spray, Zyrtec as you have been using them. Please continue nasal saline rinses every day Please continue your Mucinex every day as you have  been taking it.  [name], MD, PhD 11/20/2019, 1:50 PM [facility] [phone] or if no answer [phone]     Assessment & Plan Note by Collene Gobble, MD at 11/20/2019 1:49 PM Author: Collene Gobble, MD Author Type: Physician Filed: 11/20/2019 1:50 PM  Note Status: Written Cosign: Cosign Not Required Encounter Date: 11/20/2019  Problem: Allergic rhinitis  Editor: Collene Gobble, MD (Physician)               Chronic rhinitis with chronic sinus obstruction.  This is improved, may have been impacted by the nasal saline rinses, increase in Mucinex frequency.  Also consider the benefit of the prednisone.  If he loses ground when the prednisone is stopped then we could add it back.  Please continue your fluticasone nasal spray, Zyrtec as you have been using them. Please continue nasal saline rinses every day Please continue your Mucinex every day as you have been taking it.    Assessment & Plan Note by Collene Gobble, MD at 11/20/2019 1:49 PM Author: Collene Gobble, MD Author Type: Physician Filed: 11/20/2019 1:49 PM  Note Status: Written Cosign: Cosign Not Required Encounter Date: 11/20/2019  Problem: Chronic respiratory failure with hypoxia, on home O2 therapy [facility])  Editor: Collene Gobble, MD (Physician)               Continue 4 L/min at all times.    Assessment & Plan Note by Collene Gobble, MD at 11/20/2019 1:48 PM  Author: Collene Gobble, MD Author Type: Physician Filed: 11/20/2019 1:49 PM  Note Status: Written Cosign: Cosign Not Required Encounter Date: 11/20/2019  Problem: COPD (chronic obstructive pulmonary disease) ([place])  Editor: Collene Gobble, MD (Physician)               Severe COPD sometimes with symptoms even at rest.  Certainly limited with exertion.  He is not sure that his breathing changed very much on the low-dose prednisone although it may have impacted his sinus disease and cough.  Were going to take a trial off the prednisone to see  if he misses it if so that would restart.  We may even decide to uptitrate.  We did broach the subject today of treating symptoms with a palliative approach in the event that we are still dealing with dyspnea even though we have optimize therapy.  He was receptive to this although he does not believe it is time for this kind of philosophy at this time.  Continue Trelegy 1 inhalation once daily.  Rinse and gargle after using. Keep your albuterol available to use 2 puffs up to every 4 hours if needed for shortness of breath, chest tightness, wheezing. Please continue your Mucinex every day as you have been taking it. Stop prednisone for now.  Please call our office if you feel that you lose any ground off the prednisone.  We may decide to restart it. COVID-19 vaccine is up-to-date Follow with Dr Lamonte Sakai in 3 months or sooner if you have any problems.    Patient Instructions by Collene Gobble, MD at 11/20/2019 1:30 PM Author: Collene Gobble, MD Author Type: Physician Filed: 11/20/2019 1:47 PM  Note Status: Signed Cosign: Cosign Not Required Encounter Date: 11/20/2019  Editor: Collene Gobble, MD (Physician)               Continue Trelegy 1 inhalation once daily.  Rinse and gargle after using. Keep your albuterol available to use 2 puffs up to every 4 hours if needed for shortness of breath, chest tightness, wheezing. Please continue your fluticasone nasal spray, Zyrtec as you have been using them. Please continue nasal saline rinses every day Please continue your Mucinex every day as you have been taking it. Stop prednisone for now.  Please call our office if you feel that you lose any ground off the prednisone.  We may decide to restart it. Continue your oxygen at 4 L/min COVID-19 vaccine is up-to-date Follow with Dr Lamonte Sakai in 3 months or sooner if you have any problems.     Instructions  Continue Trelegy 1 inhalation once daily.  Rinse and gargle after using. Keep your albuterol available  to use 2 puffs up to every 4 hours if needed for shortness of breath, chest tightness, wheezing. Please continue your fluticasone nasal spray, Zyrtec as you have been using them. Please continue nasal saline rinses every day Please continue your Mucinex every day as you have been taking it. Stop prednisone for now.  Please call our office if you feel that you lose any ground off the prednisone.  We may decide to restart it. Continue your oxygen at 4 L/min COVID-19 vaccine is up-to-date Follow with Dr Lamonte Sakai in 3 months or sooner if you have any problems.         After Visit Summary (Printed 11/20/2019) Communications    [facility] Provider CC Chart Rep sent to Iona Beard, MD Media From this encounter Electronic signature on 11/20/2019 1:22 PM -  E-signed  Youth worker History  Recipient Method Sent by Date Sent  Iona Beard, MD In Rolinda Roan, MD 11/20/2019     No questionnaires available.        Was appointment offered to patient (explain)?    Reason for call:Cough with sputum clear, some wheezing. 98%o2 with 4L while sitting when getting up to walk patient's O2 went to low 90's. Patient has been using mucinex.  Patient's nurse stated patient might need a flutter valve  Dr.[name] can you please advise.      No Known Allergies  Immunization History  Administered Date(s) Administered  . Influenza Split 06/07/2012, 02/05/2017  . Influenza, High Dose Seasonal PF 03/18/2019  . Influenza, Quadrivalent, Recombinant, Inj, Pf 02/05/2018  . Influenza,inj,Quad PF,6+ Mos 01/29/2013, 02/04/2016, 03/07/2016  . Influenza-Unspecified 01/19/2015, 02/07/2017  . PFIZER SARS-COV-2 Vaccination 08/03/2019, 08/24/2019  . Pneumococcal Conjugate-13 11/13/2013  . Pneumococcal Polysaccharide-23 06/07/2012, 07/08/2019  . Tdap 06/18/2012

## 2020-01-26 NOTE — Telephone Encounter (Signed)
Spoke with Frank [name] and have left flutter up front for daughter to pick up  She is aware this must be signed for  Nothing further needed

## 2020-02-07 DIAGNOSIS — J449 Chronic obstructive pulmonary disease, unspecified: Secondary | ICD-10-CM | POA: Diagnosis not present

## 2020-02-17 ENCOUNTER — Telehealth: Payer: Self-pay | Admitting: Emergency Medicine

## 2020-02-17 DIAGNOSIS — Z9981 Dependence on supplemental oxygen: Secondary | ICD-10-CM

## 2020-02-17 DIAGNOSIS — J9611 Chronic respiratory failure with hypoxia: Secondary | ICD-10-CM

## 2020-02-17 NOTE — Telephone Encounter (Signed)
Yes ok to order  

## 2020-02-17 NOTE — Telephone Encounter (Signed)
Order sent to [facility] for transport chair  Spoke with Almyra Free and notified this was done  Nothing further needed

## 2020-02-17 NOTE — Telephone Encounter (Signed)
Spoke with Almyra Free  She states needing [place] to send order to DME for potable wheelchair  Please advise if this okay thanks!

## 2020-02-24 ENCOUNTER — Other Ambulatory Visit: Payer: Self-pay

## 2020-02-24 ENCOUNTER — Ambulatory Visit: Payer: Medicare Other | Admitting: Emergency Medicine

## 2020-02-24 ENCOUNTER — Encounter: Payer: Self-pay | Admitting: Emergency Medicine

## 2020-02-24 VITALS — BP 120/72 | HR 91 | Temp 97.7°F | Ht 66.0 in | Wt 159.8 lb

## 2020-02-24 DIAGNOSIS — J301 Allergic rhinitis due to pollen: Secondary | ICD-10-CM

## 2020-02-24 DIAGNOSIS — Z23 Encounter for immunization: Secondary | ICD-10-CM | POA: Diagnosis not present

## 2020-02-24 DIAGNOSIS — J449 Chronic obstructive pulmonary disease, unspecified: Secondary | ICD-10-CM

## 2020-02-24 NOTE — Assessment & Plan Note (Signed)
Continue Flonase and Zyrtec as you have been taking them. 

## 2020-02-24 NOTE — Assessment & Plan Note (Signed)
Please continue Trelegy 1 inhalation once daily as you have been taking it.  Rinse and gargle after using. Continue your albuterol nebulizer treatments 3 times a day Keep your albuterol inhaler available to use 2 puffs if needed shortness breath, chest tightness, wheezing. We will hold off on starting any every day prednisone at this time.  We may decide to restart it at some point in the future depending on how your breathing is doing. Continue your regular follow-ups with the palliative care nurse. COVID-19 vaccine up-to-date Flu shot today Telephone visit in 3 months or sooner if you have any problems. We will arrange for an in person visit in about 6 months.

## 2020-02-24 NOTE — Progress Notes (Signed)
Subjective:    Patient ID: Frank [name], male    DOB: [birth year], 64 y.o.   MRN: [id number]  COPD His past medical history is significant for COPD.    ROV 10/15/19 --64 year old man with severe COPD and associated chronic hypoxemic respiratory failure on 3 to 4 L/min.  He was changed to Trelegy earlier this year.  Also with chronic rhinitis, allergies, taking fluticasone nasal spray, Zyrtec, on mucinex prn, [place] prn. He was in the ED 5/30, treated for an acute exacerbation with pred and abx. Unsure whether he benefited. He is c/o persistent severe naso-sinus obstruction and congestion. His family has seen him confused especially when he wakes up from sleep. He has been having transient desaturations on 3L/min at rest.    ROV 11/20/19 --follow-up visit for 64 year old man with severe COPD and associated chronic hypoxemic respiratory failure allergic rhinitis with nasal obstruction.  We have been managing him on Trelegy but he continued to have severe dyspnea, significant limitation.  We noted transient desaturations on 3 L/min at his last visit.  I increased his oxygen to 4 L/min last visit.  Also start prednisone 10 mg daily to see if he would get benefit from chronic steroids.  He added nasal saline rinses and scheduled Mucinex to his fluticasone nasal spray and Zyrtec. Today he reports that he cannot tell much difference in his breathing, he may be coughing less, a bit less mucous. His sinuses have cleared up some. He believes the [place] do help.  COVID vaccine up to date.   ROV 02/24/20 --64 year old man with severe COPD, chronic hypoxemic respiratory failure, nasal obstruction and allergic rhinitis.  He is currently on Trelegy.  We had tried him on chronic prednisone to see if he would get benefit but he thought that it may have been deleterious so we stopped it in July.  We had talked about possible palliative care and since our last visit we did initiate. He has been getting home visits about every 2  weeks. He started a flutter valve. Has a lot of nasal drainage, still on flonase, zyrtec. Rare GERD. Uses albuterol nebs tid, uses albuterol HFA 0-2x a day. Still able to get around but slow. He is working on getting a wheelchair.     PULMONARY FUNCTON TEST 08/30/2012  FVC 3.93  FEV1 1.69  FEV1/FVC 43  FVC  % Predicted 93  FEV % Predicted 55  FeF 25-75 .71  FeF 25-75 % Predicted 3.05    Review of Systems As per HPI     Objective:   Physical Exam Vitals:   02/24/20 1521  BP: 120/72  Pulse: 91  Temp: 97.7 F (36.5 C)  TempSrc: Temporal  SpO2: 91%  Weight: 159 lb 12.8 oz (72.5 kg)  Height: 5\' 6"  (1.676 m)   Gen: Pleasant, well-nourished, in no distress,  normal affect  ENT: No lesions,  mouth clear,  oropharynx clear, no postnasal drip  Neck: No JVD, no stridor  Lungs: No use of accessory muscles, distant, scattered B wheezes throughout exhalation.   Cardiovascular: RRR, heart sounds normal, no murmur or gallops, no peripheral edema  Musculoskeletal: No deformities, no cyanosis or clubbing  Neuro: alert, non focal  Skin: Warm, no lesions or rashes     Assessment & Plan:  COPD (chronic obstructive pulmonary disease) (HCC) Please continue Trelegy 1 inhalation once daily as you have been taking it.  Rinse and gargle after using. Continue your albuterol nebulizer treatments 3 times a day Keep your albuterol  inhaler available to use 2 puffs if needed shortness breath, chest tightness, wheezing. We will hold off on starting any every day prednisone at this time.  We may decide to restart it at some point in the future depending on how your breathing is doing. Continue your regular follow-ups with the palliative care nurse. COVID-19 vaccine up-to-date Flu shot today Telephone visit in 3 months or sooner if you have any problems. We will arrange for an in person visit in about 6 months.  Allergic rhinitis Continue Flonase and Zyrtec as you have been taking  them.  [name], MD, PhD 02/24/2020, 3:45 PM [facility] [phone] or if no answer [phone]

## 2020-02-24 NOTE — Patient Instructions (Addendum)
Please continue Trelegy 1 inhalation once daily as you have been taking it.  Rinse and gargle after using. Continue your albuterol nebulizer treatments 3 times a day Keep your albuterol inhaler available to use 2 puffs if needed shortness breath, chest tightness, wheezing. Continue your oxygen at 4 L/min We will hold off on starting any every day prednisone at this time.  We may decide to restart it at some point in the future depending on how your breathing is doing. Continue your regular follow-ups with the palliative care nurse. Continue Flonase and Zyrtec as you have been taking them. COVID-19 vaccine up-to-date Flu shot today Telephone visit in 3 months or sooner if you have any problems. We will arrange for an in person visit in about 6 months.

## 2020-02-26 ENCOUNTER — Telehealth: Payer: Self-pay | Admitting: Emergency Medicine

## 2020-02-26 MED ORDER — PREDNISONE 10 MG PO TABS
ORAL_TABLET | ORAL | 0 refills | Status: DC
Start: 2020-02-26 — End: 2020-03-06

## 2020-02-26 MED ORDER — DOXYCYCLINE HYCLATE 100 MG PO TABS
100.0000 mg | ORAL_TABLET | Freq: Two times a day (BID) | ORAL | 0 refills | Status: DC
Start: 2020-02-26 — End: 2020-03-06

## 2020-02-26 NOTE — Telephone Encounter (Signed)
Called and spoke with Domingo Cocking nurse, advised on [name] recommendations, verified pharmacy and scripts sent.  She asked about having him take tylenol for temp of 101.7, advised that Tylenol is ok unless he as been advised otherwise advised by any of his providers.  She verbalized understanding.  Nothing further needed.

## 2020-02-26 NOTE — Telephone Encounter (Signed)
I think his wheezing is chronic - I just listened to him 2 days ago at [place].  I am ok treating for an AE-COPD. We may need to start chronic prednisone going forward. I qwill talk to him about this.   For now:  Prednisone Take 40mg  daily for 3 days, then 30mg  daily for 3 days, then 20mg  daily for 3 days, then 10mg  daily for 3 days, then stop  Doxycycline 100mg  bid x 7 days.

## 2020-02-26 NOTE — Telephone Encounter (Signed)
Spoke with Almyra Free patients palliative nurse who states that patient has increased SOB, non productive cough - BP is 130/85 - O2@ 96% on 4 liters - pulse 103 - Respirations 24-26 and has a fever of 101.7 orally - hears wheezing in all 4 lobes. She states that the patient told her all of this started today. Has 2 Covid vaccines and flu shot  [name] please advise

## 2020-02-26 NOTE — Telephone Encounter (Signed)
Spoke to [place] she states that specific wording is needed for insurance does Dr Lamonte Sakai want to proceed with it or send to PCP she can fax how it needs to read.

## 2020-02-27 NOTE — Telephone Encounter (Signed)
Can you advise on what we need for this?

## 2020-02-27 NOTE — Telephone Encounter (Signed)
[name], would you like to proceed with wheelchair order or defer to PCP?

## 2020-02-27 NOTE — Telephone Encounter (Signed)
I guess send to PCP. I don't do these enough to know exactly what they want written

## 2020-02-27 NOTE — Telephone Encounter (Signed)
Dr. Lisabeth Devoid, Please advise regarding the need for a wheelchair per [name].  Thank you.

## 2020-02-28 ENCOUNTER — Emergency Department (HOSPITAL_COMMUNITY): Payer: Medicare Other

## 2020-02-28 ENCOUNTER — Other Ambulatory Visit: Payer: Self-pay

## 2020-02-28 ENCOUNTER — Encounter (HOSPITAL_COMMUNITY): Payer: Self-pay | Admitting: Internal Medicine

## 2020-02-28 ENCOUNTER — Inpatient Hospital Stay (HOSPITAL_COMMUNITY)
Admission: EM | Admit: 2020-02-28 | Discharge: 2020-03-06 | DRG: 177 | Disposition: A | Discharge status: Home Health | Payer: Medicare Other | Attending: Internal Medicine | Admitting: Internal Medicine

## 2020-02-28 DIAGNOSIS — U071 COVID-19: Principal | ICD-10-CM | POA: Diagnosis present

## 2020-02-28 DIAGNOSIS — J9611 Chronic respiratory failure with hypoxia: Secondary | ICD-10-CM

## 2020-02-28 DIAGNOSIS — Z7951 Long term (current) use of inhaled steroids: Secondary | ICD-10-CM

## 2020-02-28 DIAGNOSIS — J439 Emphysema, unspecified: Secondary | ICD-10-CM | POA: Diagnosis not present

## 2020-02-28 DIAGNOSIS — J449 Chronic obstructive pulmonary disease, unspecified: Secondary | ICD-10-CM | POA: Diagnosis not present

## 2020-02-28 DIAGNOSIS — Z8249 Family history of ischemic heart disease and other diseases of the circulatory system: Secondary | ICD-10-CM | POA: Diagnosis not present

## 2020-02-28 DIAGNOSIS — Z87442 Personal history of urinary calculi: Secondary | ICD-10-CM

## 2020-02-28 DIAGNOSIS — R069 Unspecified abnormalities of breathing: Secondary | ICD-10-CM | POA: Diagnosis not present

## 2020-02-28 DIAGNOSIS — Z87891 Personal history of nicotine dependence: Secondary | ICD-10-CM

## 2020-02-28 DIAGNOSIS — J1282 Pneumonia due to coronavirus disease 2019: Secondary | ICD-10-CM | POA: Diagnosis not present

## 2020-02-28 DIAGNOSIS — Z9981 Dependence on supplemental oxygen: Secondary | ICD-10-CM | POA: Diagnosis not present

## 2020-02-28 DIAGNOSIS — J9621 Acute and chronic respiratory failure with hypoxia: Secondary | ICD-10-CM | POA: Diagnosis present

## 2020-02-28 DIAGNOSIS — R0602 Shortness of breath: Secondary | ICD-10-CM | POA: Diagnosis not present

## 2020-02-28 DIAGNOSIS — R0689 Other abnormalities of breathing: Secondary | ICD-10-CM | POA: Diagnosis not present

## 2020-02-28 DIAGNOSIS — R0902 Hypoxemia: Secondary | ICD-10-CM | POA: Diagnosis not present

## 2020-02-28 DIAGNOSIS — Z8371 Family history of colonic polyps: Secondary | ICD-10-CM

## 2020-02-28 DIAGNOSIS — E873 Alkalosis: Secondary | ICD-10-CM | POA: Diagnosis present

## 2020-02-28 DIAGNOSIS — J90 Pleural effusion, not elsewhere classified: Secondary | ICD-10-CM | POA: Diagnosis not present

## 2020-02-28 HISTORY — DX: COVID-19: U07.1

## 2020-02-28 LAB — COMPREHENSIVE METABOLIC PANEL
ALT: 24 U/L (ref 0–44)
AST: 35 U/L (ref 15–41)
Albumin: 3.5 g/dL (ref 3.5–5.0)
Alkaline Phosphatase: 66 U/L (ref 38–126)
Anion gap: 10 (ref 5–15)
BUN: 17 mg/dL (ref 8–23)
CO2: 31 mmol/L (ref 22–32)
Calcium: 9.3 mg/dL (ref 8.9–10.3)
Chloride: 97 mmol/L — ABNORMAL LOW (ref 98–111)
Creatinine, Ser: 0.89 mg/dL (ref 0.61–1.24)
GFR, Estimated: >60 mL/min (ref >60)
Glucose, Bld: 131 mg/dL — ABNORMAL HIGH (ref 70–99)
Potassium: 4 mmol/L (ref 3.5–5.1)
Sodium: 138 mmol/L (ref 135–145)
Total Bilirubin: 0.6 mg/dL (ref 0.3–1.2)
Total Protein: 7.3 g/dL (ref 6.5–8.1)

## 2020-02-28 LAB — I-STAT VENOUS BLOOD GAS, ED
Acid-Base Excess: 12 mmol/L — ABNORMAL HIGH (ref 0.0–2.0)
Bicarbonate: 35.4 mmol/L — ABNORMAL HIGH (ref 20.0–28.0)
Calcium, Ion: 0.93 mmol/L — ABNORMAL LOW (ref 1.15–1.40)
HCT: 43 % (ref 39.0–52.0)
Hemoglobin: 14.6 g/dL (ref 13.0–17.0)
O2 Saturation: 100 %
Potassium: 3.7 mmol/L (ref 3.5–5.1)
Sodium: 136 mmol/L (ref 135–145)
TCO2: 37 mmol/L — ABNORMAL HIGH (ref 22–32)
pCO2, Ven: 38.5 mmHg — ABNORMAL LOW (ref 44.0–60.0)
pH, Ven: 7.571 — ABNORMAL HIGH (ref 7.250–7.430)
pO2, Ven: 213 mmHg — ABNORMAL HIGH (ref 32.0–45.0)

## 2020-02-28 LAB — HIV ANTIBODY (ROUTINE TESTING W REFLEX): HIV Screen 4th Generation wRfx: NONREACTIVE

## 2020-02-28 LAB — CBC WITH DIFFERENTIAL/PLATELET
Abs Immature Granulocytes: 0.05 10*3/uL (ref 0.00–0.07)
Basophils Absolute: 0.1 10*3/uL (ref 0.0–0.1)
Basophils Relative: 1 %
Eosinophils Absolute: 0 10*3/uL (ref 0.0–0.5)
Eosinophils Relative: 0 %
HCT: 46.1 % (ref 39.0–52.0)
Hemoglobin: 14.7 g/dL (ref 13.0–17.0)
Immature Granulocytes: 1 %
Lymphocytes Relative: 9 %
Lymphs Abs: 1 10*3/uL (ref 0.7–4.0)
MCH: 28.8 pg (ref 26.0–34.0)
MCHC: 31.9 g/dL (ref 30.0–36.0)
MCV: 90.4 fL (ref 80.0–100.0)
Monocytes Absolute: 1.5 10*3/uL — ABNORMAL HIGH (ref 0.1–1.0)
Monocytes Relative: 14 %
Neutro Abs: 8.2 10*3/uL — ABNORMAL HIGH (ref 1.7–7.7)
Neutrophils Relative %: 75 %
Platelets: 200 10*3/uL (ref 150–400)
RBC: 5.1 MIL/uL (ref 4.22–5.81)
RDW: 11.9 % (ref 11.5–15.5)
WBC: 10.8 10*3/uL — ABNORMAL HIGH (ref 4.0–10.5)
nRBC: 0 % (ref 0.0–0.2)

## 2020-02-28 LAB — CBC
HCT: 42.9 % (ref 39.0–52.0)
Hemoglobin: 13.5 g/dL (ref 13.0–17.0)
MCH: 28.9 pg (ref 26.0–34.0)
MCHC: 31.5 g/dL (ref 30.0–36.0)
MCV: 91.9 fL (ref 80.0–100.0)
Platelets: 184 10*3/uL (ref 150–400)
RBC: 4.67 MIL/uL (ref 4.22–5.81)
RDW: 12 % (ref 11.5–15.5)
WBC: 9.1 10*3/uL (ref 4.0–10.5)
nRBC: 0 % (ref 0.0–0.2)

## 2020-02-28 LAB — RESPIRATORY PANEL BY RT PCR (FLU A&B, COVID)
Influenza A by PCR: NEGATIVE
Influenza B by PCR: NEGATIVE
SARS Coronavirus 2 by RT PCR: POSITIVE — AB

## 2020-02-28 LAB — PROTIME-INR
INR: 0.9 (ref 0.8–1.2)
Prothrombin Time: 11.8 seconds (ref 11.4–15.2)

## 2020-02-28 LAB — CREATININE, SERUM
Creatinine, Ser: 0.94 mg/dL (ref 0.61–1.24)
GFR, Estimated: >60 mL/min (ref >60)

## 2020-02-28 LAB — APTT: aPTT: 30 seconds (ref 24–36)

## 2020-02-28 LAB — BRAIN NATRIURETIC PEPTIDE: B Natriuretic Peptide: 51.1 pg/mL (ref 0.0–100.0)

## 2020-02-28 LAB — LACTIC ACID, PLASMA: Lactic Acid, Venous: 1 mmol/L (ref 0.5–1.9)

## 2020-02-28 LAB — TROPONIN I (HIGH SENSITIVITY)
Troponin I (High Sensitivity): 11 ng/L (ref <18)
Troponin I (High Sensitivity): 8 ng/L (ref <18)

## 2020-02-28 LAB — PROCALCITONIN: Procalcitonin: <0.1 ng/mL

## 2020-02-28 MED ORDER — FLUTICASONE FUROATE-VILANTEROL 100-25 MCG/INH IN AEPB
1.0000 | INHALATION_SPRAY | Freq: Every day | RESPIRATORY_TRACT | Status: DC
  Administered 2020-02-28 – 2020-03-06 (×8): 1 via RESPIRATORY_TRACT
  Filled 2020-02-28: qty 28

## 2020-02-28 MED ORDER — SODIUM CHLORIDE 0.9 % IV SOLN
200.0000 mg | Freq: Once | INTRAVENOUS | Status: AC
  Administered 2020-02-28: 200 mg via INTRAVENOUS
  Filled 2020-02-28: qty 40

## 2020-02-28 MED ORDER — SODIUM CHLORIDE 0.9 % IV SOLN
1.0000 g | Freq: Once | INTRAVENOUS | Status: AC
  Administered 2020-02-28: 1 g via INTRAVENOUS
  Filled 2020-02-28: qty 10

## 2020-02-28 MED ORDER — GUAIFENESIN-DM 100-10 MG/5ML PO SYRP
10.0000 mL | ORAL_SOLUTION | ORAL | Status: DC | PRN
  Administered 2020-03-02 – 2020-03-04 (×2): 10 mL via ORAL
  Filled 2020-02-28 (×2): qty 10

## 2020-02-28 MED ORDER — UMECLIDINIUM BROMIDE 62.5 MCG/INH IN AEPB
1.0000 | INHALATION_SPRAY | Freq: Every day | RESPIRATORY_TRACT | Status: DC
  Administered 2020-02-28 – 2020-03-06 (×8): 1 via RESPIRATORY_TRACT
  Filled 2020-02-28: qty 7

## 2020-02-28 MED ORDER — DEXAMETHASONE 6 MG PO TABS
6.0000 mg | ORAL_TABLET | ORAL | Status: DC
  Administered 2020-02-28 – 2020-03-05 (×7): 6 mg via ORAL
  Filled 2020-02-28: qty 2
  Filled 2020-02-28 (×6): qty 1

## 2020-02-28 MED ORDER — PNEUMOCOCCAL VAC POLYVALENT 25 MCG/0.5ML IJ INJ
0.5000 mL | INJECTION | INTRAMUSCULAR | Status: DC
  Filled 2020-02-28: qty 0.5

## 2020-02-28 MED ORDER — ENOXAPARIN SODIUM 40 MG/0.4ML ~~LOC~~ SOLN
40.0000 mg | SUBCUTANEOUS | Status: DC
  Administered 2020-02-28 – 2020-03-05 (×7): 40 mg via SUBCUTANEOUS
  Filled 2020-02-28 (×7): qty 0.4

## 2020-02-28 MED ORDER — SODIUM CHLORIDE 0.9 % IV SOLN
100.0000 mg | Freq: Every day | INTRAVENOUS | Status: AC
  Administered 2020-02-29 – 2020-03-03 (×4): 100 mg via INTRAVENOUS
  Filled 2020-02-28 (×4): qty 20

## 2020-02-28 MED ORDER — SODIUM CHLORIDE 0.9 % IV SOLN
500.0000 mg | Freq: Once | INTRAVENOUS | Status: AC
  Administered 2020-02-28: 500 mg via INTRAVENOUS
  Filled 2020-02-28: qty 500

## 2020-02-28 MED ORDER — LACTATED RINGERS IV BOLUS (SEPSIS)
500.0000 mL | Freq: Once | INTRAVENOUS | Status: AC
  Administered 2020-02-28: 500 mL via INTRAVENOUS

## 2020-02-28 MED ORDER — ALBUTEROL SULFATE HFA 108 (90 BASE) MCG/ACT IN AERS
4.0000 | INHALATION_SPRAY | Freq: Once | RESPIRATORY_TRACT | Status: AC
  Administered 2020-02-28: 4 via RESPIRATORY_TRACT
  Filled 2020-02-28: qty 6.7

## 2020-02-28 NOTE — ED Notes (Signed)
Attempted to reach sister via mobile number listed in chart. Unable to reach or leave voice message

## 2020-02-28 NOTE — ED Provider Notes (Signed)
[facility] EMER[facility]ENC[facility] DEPART[facility] Provider Note   CSN: [id number] Arrival date & time: 02/28/20  1144     History Chief Complaint  Patient presents with  . Shortness of Breath    Frank [name] is a 64 y.o. male.  The history is provided by the patient, the EMS personnel and medical records.  Shortness of Breath  Frank [name] is a 64 y.o. male who presents to the Emergency Department complaining of shortness of breath. He presents the emergency department by EMS complaining of shortness of breath for the last three days. He has a history of COPD and is on 4 L oxygen at baseline and lives alone. He states for the last three days he is experienced increased shortness of breath with a very hard cough. He has experienced fevers at home to 101. He was treated by EMS with Solu-Medrol, Tylenol, albuterol and Atrovent. He states that he feels partially improved after EMS interventions. No known sick contacts. Denies any chest pain, abdominal pain, vomiting, diarrhea. He does have mild nausea. He has been fully vaccinated for COVID-19. Symptoms are severe, constant, worsening.    Past Medical History:  Diagnosis Date  . COPD (chronic obstructive pulmonary disease) ([place]) 06/05/2012  . Dyspnea   . Dysrhythmia   . Emphysema 06/07/2012  . Heart murmur    hx of as baby  . History of kidney stones   . Hypoxemia 06/07/2012  . Obstructive chronic bronchitis with exacerbation ([place]) 06/07/2012  . Pneumonia   . Tobacco abuse 06/05/2012    Patient Active Problem List   Diagnosis Date Noted  . COVID-19 02/28/2020  . Physical deconditioning 08/29/2019  . Decreased appetite 07/08/2019  . Healthcare maintenance 07/08/2019  . Allergic rhinitis 05/30/2017  . COPD (chronic obstructive pulmonary disease) ([place]) 11/27/2016  . Advance care planning 10/23/2016  . Diverticulosis of colon 06/09/2016  . History of colonic polyps 06/17/2014  . Benign prostatic hyperplasia (BPH) with  straining on urination 06/12/2014  . Chronic respiratory failure with hypoxia, on home O2 therapy ([place]) 06/05/2012    Past Surgical History:  Procedure Laterality Date  . CATARACT EXTRACTION, BILATERAL    . CYSTOSCOPY/RETROGRADE/URETEROSCOPY/STONE EXTRACTION WITH BASKET Right 10/04/2016   Procedure: CYSTOSCOPY/RETROGRADE/URETEROSCOPY/STONE EXTRACTION WITH BASKET/ STENT;  Surgeon: Alexis Frock, MD;  Location: [facility];  Service: Urology;  Laterality: Right;  With LASER  . EXTRACORPOREAL SHOCK WAVE LITHOTRIPSY Right 06/29/2016   Procedure: RIGHT EXTRACORPOREAL SHOCK WAVE LITHOTRIPSY (ESWL);  Surgeon: Ardis Hughs, MD;  Location: [facility];  Service: Urology;  Laterality: Right;       Family History  Problem Relation Age of Onset  . Colon polyps Father   . Heart attack Father 75  . Colon polyps Sister     Social History   Tobacco Use  . Smoking status: Former Smoker    Packs/day: 0.50    Years: 35.00    Pack years: 17.50    Types: Cigarettes    Quit date: 05/11/2015    Years since quitting: 4.8  . Smokeless tobacco: Former Systems developer    Types: Chew    Quit date: 05/08/1968  Vaping Use  . Vaping Use: Former  . Quit date: 10/05/2015  Substance Use Topics  . Alcohol use: No    Alcohol/week: 0.0 standard drinks  . Drug use: No    Home Medications Prior to Admission medications   Medication Sig Start Date End Date Taking? Authorizing Provider  albuterol (ACCUNEB) 0.63 MG/3ML nebulizer solution USE 1 VIAL  IN NEBULIZER EVERY 6 HOURS Patient taking differently: Take 1 ampule by nebulization every 6 (six) hours.  09/01/19  Yes Collene Gobble, MD  albuterol (VENTOLIN HFA) 108 (90 Base) MCG/ACT inhaler INHALE 2 PUFFS BY MOUTH EVERY 4 HOURS AS NEEDED FOR WHEEZING OR SHORTNESS OF BREATH Patient taking differently: Inhale 2 puffs into the lungs every 4 (four) hours as needed for wheezing or shortness of breath.  11/20/19  Yes Collene Gobble, MD  doxycycline (VIBRA-TABS) 100 MG tablet Take 1  tablet (100 mg total) by mouth 2 (two) times daily. 02/26/20  Yes Collene Gobble, MD  fluticasone (FLONASE) 50 MCG/ACT nasal spray Place 1 spray into both nostrils daily. Patient taking differently: Place 1 spray into both nostrils daily as needed for allergies or rhinitis.  09/26/19  Yes Lauraine Rinne, NP  Fluticasone-Umeclidin-Vilant (TRELEGY ELLIPTA) 100-62.5-25 MCG/INH AEPB Inhale 1 puff into the lungs daily. 09/26/19  Yes Lauraine Rinne, NP  OXYGEN Inhale 4 L/min into the lungs continuous.    Yes [provider]  predniSONE (DELTASONE) 10 MG tablet Take 4 tablets (40 mg total) by mouth daily with breakfast for 3 days, THEN 3 tablets (30 mg total) daily with breakfast for 3 days, THEN 2 tablets (20 mg total) daily with breakfast for 3 days, THEN 1 tablet (10 mg total) daily with breakfast for 3 days. 02/26/20 03/09/20 Yes Collene Gobble, MD    Allergies    Patient has no known allergies.  Review of Systems   Review of Systems  Respiratory: Positive for shortness of breath.   All other systems reviewed and are negative.   Physical Exam Updated Vital Signs BP 127/75 (BP Location: Left Arm)   Pulse 85   Temp 97.8 F (36.6 C) (Axillary)   Resp (!) 24   Ht 5\' 6"  (1.676 m)   Wt 74.8 kg   SpO2 93%   BMI 26.63 kg/m   Physical Exam Vitals and nursing note reviewed.  Constitutional:      General: He is in acute distress.     Appearance: He is well-developed. He is ill-appearing.  HENT:     Head: Normocephalic and atraumatic.  Cardiovascular:     Rate and Rhythm: Regular rhythm. Tachycardia present.     Heart sounds: No murmur heard.   Pulmonary:     Effort: Respiratory distress present.     Comments: Tachypnea. Decreased air movement bilaterally with faint expiratory wheezes. Abdominal:     Palpations: Abdomen is soft.     Tenderness: There is no abdominal tenderness. There is no guarding or rebound.  Musculoskeletal:        General: No swelling or tenderness.   Skin:    General: Skin is warm and dry.  Neurological:     Mental Status: He is alert and oriented to person, place, and time.  Psychiatric:        Behavior: Behavior normal.     ED Results / Procedures / Treatments   Labs (all labs ordered are listed, but only abnormal results are displayed) Labs Reviewed  RESPIRATORY PANEL BY RT PCR (FLU A&B, COVID) - Abnormal; Notable for the following components:      Result Value   SARS Coronavirus 2 by RT PCR POSITIVE (*)    All other components within normal limits  COMPREHENSIVE METABOLIC PANEL - Abnormal; Notable for the following components:   Chloride 97 (*)    Glucose, Bld 131 (*)    All other components within  normal limits  CBC WITH DIFFERENTIAL/PLATELET - Abnormal; Notable for the following components:   WBC 10.8 (*)    Neutro Abs 8.2 (*)    Monocytes Absolute 1.5 (*)    All other components within normal limits  I-STAT VENOUS BLOOD GAS, ED - Abnormal; Notable for the following components:   pH, Ven 7.571 (*)    pCO2, Ven 38.5 (*)    pO2, Ven 213.0 (*)    Bicarbonate 35.4 (*)    TCO2 37 (*)    Acid-Base Excess 12.0 (*)    Calcium, Ion 0.93 (*)    All other components within normal limits  URINE CULTURE  CULTURE, BLOOD (ROUTINE X 2)  CULTURE, BLOOD (ROUTINE X 2)  LACTIC ACID, PLASMA  PROTIME-INR  APTT  BRAIN NATRIURETIC PEPTIDE  HIV ANTIBODY (ROUTINE TESTING W REFLEX)  CBC  CREATININE, SERUM  PROCALCITONIN  URINALYSIS, ROUTINE W REFLEX MICROSCOPIC  CBC WITH DIFFERENTIAL/PLATELET  COMPREHENSIVE METABOLIC PANEL  C-REACTIVE PROTEIN  D-DIMER, QUANTITATIVE (NOT AT [facility])  FERRITIN  MAGNESIUM  PHOSPHORUS  TROPONIN I (HIGH SENSITIVITY)  TROPONIN I (HIGH SENSITIVITY)    EKG EKG Interpretation  Date/Time:  Saturday February 28 2020 11:48:14 EDT Ventricular Rate:  124 PR Interval:    QRS Duration: 118 QT Interval:  308 QTC Calculation: 443 R Axis:   -140 Text Interpretation: Sinus tachycardia Right bundle branch  block Low voltage, precordial leads Artifact in lead(s) I II III aVR aVL aVF V5 Confirmed by Quintella Reichert [phone]) on 02/28/2020 12:31:12 PM   Radiology DG Chest Port 1 View  Result Date: 02/28/2020 CLINICAL DATA:  Shortness of breath. EXAM: PORTABLE CHEST 1 VIEW COMPARISON:  Oct 05, 2019. FINDINGS: The heart size and mediastinal contours are within normal limits. No pneumothorax or pleural effusion is noted. Emphysematous disease is noted. Stable right basilar interstitial densities are noted concerning for scarring or atelectasis. The visualized skeletal structures are unremarkable. IMPRESSION: Stable right basilar interstitial densities are noted concerning for scarring or atelectasis. Emphysematous disease is noted. Emphysema (ICD10-J43.9). Electronically Signed   By: Marijo Conception M.D.   On: 02/28/2020 12:40    Procedures Procedures (including critical care time) CRITICAL CARE Performed by: Quintella Reichert   Total critical care time: 40 minutes  Critical care time was exclusive of separately billable procedures and treating other patients.  Critical care was necessary to treat or prevent imminent or life-threatening deterioration.  Critical care was time spent personally by me on the following activities: development of treatment plan with patient and/or surrogate as well as nursing, discussions with consultants, evaluation of patient's response to treatment, examination of patient, obtaining history from patient or surrogate, ordering and performing treatments and interventions, ordering and review of laboratory studies, ordering and review of radiographic studies, pulse oximetry and re-evaluation of patient's condition.  Medications Ordered in ED Medications  fluticasone furoate-vilanterol (BREO ELLIPTA) 100-25 MCG/INH 1 puff (1 puff Inhalation Given 02/28/20 2034)  enoxaparin (LOVENOX) injection 40 mg (40 mg Subcutaneous Given 02/28/20 1647)  remdesivir 200 mg in sodium  chloride 0.9% 250 mL IVPB (0 mg Intravenous Stopped 02/28/20 1716)    Followed by  remdesivir 100 mg in sodium chloride 0.9 % 100 mL IVPB (has no administration in time range)  dexamethasone (DECADRON) tablet 6 mg (6 mg Oral Given 02/28/20 1646)  guaiFENesin-dextromethorphan (ROBITUSSIN DM) 100-10 MG/5ML syrup 10 mL (has no administration in time range)  umeclidinium bromide (INCRUSE ELLIPTA) 62.5 MCG/INH 1 puff (1 puff Inhalation Given 02/28/20 2034)  pneumococcal 23 valent  vaccine (PNEUMOVAX-23) injection 0.5 mL (has no administration in time range)  lactated ringers bolus 500 mL (0 mLs Intravenous Stopped 02/28/20 1253)  albuterol (VENTOLIN HFA) 108 (90 Base) MCG/ACT inhaler 4 puff (4 puffs Inhalation Given 02/28/20 1218)  cefTRIAXone (ROCEPHIN) 1 g in sodium chloride 0.9 % 100 mL IVPB (0 g Intravenous Stopped 02/28/20 1407)  azithromycin (ZITHROMAX) 500 mg in sodium chloride 0.9 % 250 mL IVPB (0 mg Intravenous Stopped 02/28/20 1407)    ED Course  I have reviewed the triage vital signs and the nursing notes.  Pertinent labs & imaging results that were available during my care of the patient were reviewed by me and considered in my medical decision making (see chart for details).    MDM Rules/Calculators/A&P                          Patient with history of COPD on home oxygen at baseline here for evaluation of shortness of breath, fevers, cough. Patient in respiratory distress on ED presentation. He was treated with albuterol, supplemental oxygen. He received acetaminophen for fever as well as Solu-Medrol prior to ED arrival. He was treated empirically with antibiotics for possible pneumonia pending further workup. Chest x-ray without evidence of acute pneumonia. COVID-19 test came back positive. On reassessment patient is feeling improved but does continue to have tachypnea to the mid 30s. Discussed with patient findings of studies recommendation for admission for ongoing treatment and he is  in agreement with treatment plan. Medicine consulted for admission. Final Clinical Impression(s) / ED Diagnoses Final diagnoses:  COVID-19 virus infection    Rx / DC Orders ED Discharge Orders    None       Quintella Reichert, MD 02/28/20 2132

## 2020-02-28 NOTE — ED Notes (Signed)
Portable CXR at bedside. Urinal at bedside, pt aware of need for sample

## 2020-02-28 NOTE — Progress Notes (Signed)
Patient arrived to unit. Stable. VSS. A&ox4. 4L oz requirement. Bed alarm on, call light in reach, bed in lowest position. Assessment completed

## 2020-02-28 NOTE — ED Triage Notes (Signed)
Pt presents from home where he lives alone for SOB, nonproductive cough x3 days. H/o COPD, wears 4L at baseline, used albuterol breathing tx and daily inhaler prior to contacting EMS. EMS arrived to pt wheezing bilat, ST 110bpm, RR40, provided 125mg  solumedrol, abuterol neb and atrovent, 1000mg  tylenol. A&Ox4, warm, dry

## 2020-02-28 NOTE — H&P (Addendum)
Date: 02/28/2020               Patient Name:  Frank [name] MRN: [id number]  DOB: 07/04/1955 Age / Sex: 64 y.o., male   PCP: Iona Beard, MD         Medical Service: Internal Medicine Teaching Service         Attending Physician: Dr. Lucious Groves, DO    First Contact: Dr. Virl Axe, MD Pager: [phone]  Second Contact: Dr. Madalyn Rob Pager: [phone]       After Hours (After 5p/  First Contact Pager: [phone]  weekends / holidays): Second Contact Pager: [phone]   Chief Complaint: Shortness of breath and cough  History of Present Illness:  64 year old male with history of COPD and tobacco use disorder (17.5 pack-years, quit 2 years ago) presenting with 5-day history of congestion, shortness of breath, and non-productive cough. He reports that he began feeling congested and having some shortness of breath 5 days ago. Patient is COVID vaccinated. He went to see his pulmonologist, [name], 4 days ago and was told that it was likely from his COPD. He was given his flu shot and told to continue his trelegy and albuterol nebs. He was prescribed prednisone and doxycyline, which he has been taking for the past two days without any relief. This prompted him to come in to the ED, where he was found to be tachycardic and tachypneic. He was given 125mg  solumedrol, albuterol nebs and atrovent due to concern for COPD exacerbation. Also given 1 dose of azithromycin and rocephin for CAP coverage. Afterwards, found to be COVID-19 positive. Admitted to IMTS for further evaluation and care.  Meds: No current facility-administered medications on file prior to encounter.   Current Outpatient Medications on File Prior to Encounter  Medication Sig Dispense Refill  . albuterol (ACCUNEB) 0.63 MG/3ML nebulizer solution USE 1 VIAL IN NEBULIZER EVERY 6 HOURS 360 mL 5  . albuterol (VENTOLIN HFA) 108 (90 Base) MCG/ACT inhaler INHALE 2 PUFFS BY MOUTH EVERY 4 HOURS AS NEEDED FOR WHEEZING OR  SHORTNESS OF BREATH 18 g 5  . doxycycline (VIBRA-TABS) 100 MG tablet Take 1 tablet (100 mg total) by mouth 2 (two) times daily. 14 tablet 0  . fluticasone (FLONASE) 50 MCG/ACT nasal spray Place 1 spray into both nostrils daily. 16 g 3  . Fluticasone-Umeclidin-Vilant (TRELEGY ELLIPTA) 100-62.5-25 MCG/INH AEPB Inhale 1 puff into the lungs daily. 28 each 6  . OXYGEN Inhale 3 L into the lungs continuous.     . predniSONE (DELTASONE) 10 MG tablet Take 4 tablets (40 mg total) by mouth daily with breakfast for 3 days, THEN 3 tablets (30 mg total) daily with breakfast for 3 days, THEN 2 tablets (20 mg total) daily with breakfast for 3 days, THEN 1 tablet (10 mg total) daily with breakfast for 3 days. 30 tablet 0     Allergies: Allergies as of 02/28/2020  . (No Known Allergies)   Past Medical History:  Diagnosis Date  . COPD (chronic obstructive pulmonary disease) ([place]) 06/05/2012  . Dyspnea   . Dysrhythmia   . Emphysema 06/07/2012  . Heart murmur    hx of as baby  . History of kidney stones   . Hypoxemia 06/07/2012  . Obstructive chronic bronchitis with exacerbation ([place]) 06/07/2012  . Pneumonia   . Tobacco abuse 06/05/2012    Family History:  Family History  Problem Relation Age of Onset  . Colon polyps Father   .  Heart attack Father 99  . Colon polyps Sister      Social History:  Social History   Socioeconomic History  . Marital status: Single    Spouse name: Not on file  . Number of children: Not on file  . Years of education: Not on file  . Highest education level: Not on file  Occupational History  . Not on file  Tobacco Use  . Smoking status: Former Smoker    Packs/day: 0.50    Years: 35.00    Pack years: 17.50    Types: Cigarettes    Quit date: 05/11/2015    Years since quitting: 4.8  . Smokeless tobacco: Former Systems developer    Types: Chew    Quit date: 05/08/1968  Vaping Use  . Vaping Use: Former  . Quit date: 10/05/2015  Substance and Sexual Activity  . Alcohol use: No      Alcohol/week: 0.0 standard drinks  . Drug use: No  . Sexual activity: Not Currently  Other Topics Concern  . Not on file  Social History Narrative  . Not on file   Social Determinants of Health   Financial Resource Strain:   . Difficulty of Paying Living Expenses: Not on file  Food Insecurity:   . Worried About Charity fundraiser in the Last Year: Not on file  . Ran Out of Food in the Last Year: Not on file  Transportation Needs:   . Lack of Transportation (Medical): Not on file  . Lack of Transportation (Non-Medical): Not on file  Physical Activity:   . Days of Exercise per Week: Not on file  . Minutes of Exercise per Session: Not on file  Stress:   . Feeling of Stress : Not on file  Social Connections:   . Frequency of Communication with Friends and Family: Not on file  . Frequency of Social Gatherings with Friends and Family: Not on file  . Attends Religious Services: Not on file  . Active Member of Clubs or Organizations: Not on file  . Attends Archivist Meetings: Not on file  . Marital Status: Not on file  Intimate Partner Violence:   . Fear of Current or Ex-Partner: Not on file  . Emotionally Abused: Not on file  . Physically Abused: Not on file  . Sexually Abused: Not on file    Review of Systems: A complete ROS was negative except as per HPI.   (+) for non-productive cough, congestion, shortness of breath   Physical Exam: Blood pressure 116/66, pulse 84, temperature (!) 102 F (38.9 C), temperature source Rectal, resp. rate (!) 26, height 5\' 6"  (1.676 m), weight 74.8 kg, SpO2 95 %.  Physical Exam Constitutional:      General: He is not in acute distress.    Appearance: He is well-developed.  HENT:     Head: Normocephalic and atraumatic.     Mouth/Throat:     Mouth: Mucous membranes are moist.     Pharynx: Oropharynx is clear.  Eyes:     Extraocular Movements: Extraocular movements intact.     Pupils: Pupils are equal, round, and  reactive to light.  Cardiovascular:     Rate and Rhythm: Normal rate and regular rhythm.     Pulses: Normal pulses.     Heart sounds: Normal heart sounds. No murmur heard.  No friction rub. No gallop.   Pulmonary:     Effort: Tachypnea present. No accessory muscle usage.     Comments: Coarse breath  sounds bilaterally. No wheezing heard at this time. No crackles or rhonchi noted. Chest:     Chest wall: No tenderness or crepitus.  Abdominal:     General: Bowel sounds are normal.     Palpations: Abdomen is soft.     Tenderness: There is no abdominal tenderness. There is no guarding or rebound.  Musculoskeletal:        General: Normal range of motion.     Cervical back: Normal range of motion.     Right lower leg: No edema.     Left lower leg: No edema.  Skin:    General: Skin is warm and dry.     Capillary Refill: Capillary refill takes less than 2 seconds.     Findings: No rash.  Neurological:     General: No focal deficit present.     Mental Status: He is alert and oriented to person, place, and time.  Psychiatric:        Mood and Affect: Mood normal.        Behavior: Behavior normal.     EKG: sinus tachycardia  DG Chest Port 1 View  Result Date: 02/28/2020 CLINICAL DATA:  Shortness of breath. EXAM: PORTABLE CHEST 1 VIEW COMPARISON:  Oct 05, 2019. FINDINGS: The heart size and mediastinal contours are within normal limits. No pneumothorax or pleural effusion is noted. Emphysematous disease is noted. Stable right basilar interstitial densities are noted concerning for scarring or atelectasis. The visualized skeletal structures are unremarkable. IMPRESSION: Stable right basilar interstitial densities are noted concerning for scarring or atelectasis. Emphysematous disease is noted. Emphysema (ICD10-J43.9). Electronically Signed   By: Marijo Conception M.D.   On: 02/28/2020 12:40     Assessment & Plan by Problem: Active Problems:   COVID-19  COPD exacerbation 2/2 COVID-19  Infection Chronic respiratory failure, on 4L supplemental O2 at home Patient with increased dyspnea and non-productive cough. On chronic 4L of O2 at home, now still requiring 4L O2. Patient is at risk of further decompensation due to chronic respiratory failure in the setting of COPD. Will admit him for further care. - received 125mg  solumedrol in the ED - received azithromycin and rocephin in the ED for CAP coverage - supplemental O2 to maintain saturations >88% - trend inflammatory markers - day 1 of remdesivir and decadron - continue home incruse and breo daily - robitussin q4h prn for cough - lovenox ppx - daily cbc, bmp, mag, phos - will have PT/OT evaluate tomorrow for mobility/OOB  Tobacco Use Disorder -17.5 pack-year history of smoking cigarettes, quit 2 years ago.   Dispo: Admit patient to Inpatient with expected length of stay greater than 2 midnights.  Signed: Virl Axe, MD 02/28/2020, 3:49 PM  Pager: [phone] After 5pm on weekdays and 1pm on weekends: On Call pager: [phone]

## 2020-02-28 NOTE — ED Notes (Signed)
EDP at bedside  

## 2020-02-29 LAB — COMPREHENSIVE METABOLIC PANEL
ALT: 23 U/L (ref 0–44)
AST: 29 U/L (ref 15–41)
Albumin: 3 g/dL — ABNORMAL LOW (ref 3.5–5.0)
Alkaline Phosphatase: 54 U/L (ref 38–126)
Anion gap: 10 (ref 5–15)
BUN: 18 mg/dL (ref 8–23)
CO2: 31 mmol/L (ref 22–32)
Calcium: 8.7 mg/dL — ABNORMAL LOW (ref 8.9–10.3)
Chloride: 97 mmol/L — ABNORMAL LOW (ref 98–111)
Creatinine, Ser: 0.85 mg/dL (ref 0.61–1.24)
GFR, Estimated: >60 mL/min (ref >60)
Glucose, Bld: 118 mg/dL — ABNORMAL HIGH (ref 70–99)
Potassium: 4.2 mmol/L (ref 3.5–5.1)
Sodium: 138 mmol/L (ref 135–145)
Total Bilirubin: 0.7 mg/dL (ref 0.3–1.2)
Total Protein: 6.6 g/dL (ref 6.5–8.1)

## 2020-02-29 LAB — MAGNESIUM: Magnesium: 1.9 mg/dL (ref 1.7–2.4)

## 2020-02-29 LAB — CBC WITH DIFFERENTIAL/PLATELET
Abs Immature Granulocytes: 0.04 10*3/uL (ref 0.00–0.07)
Basophils Absolute: 0 10*3/uL (ref 0.0–0.1)
Basophils Relative: 0 %
Eosinophils Absolute: 0 10*3/uL (ref 0.0–0.5)
Eosinophils Relative: 0 %
HCT: 42.2 % (ref 39.0–52.0)
Hemoglobin: 13.5 g/dL (ref 13.0–17.0)
Immature Granulocytes: 1 %
Lymphocytes Relative: 5 %
Lymphs Abs: 0.5 10*3/uL — ABNORMAL LOW (ref 0.7–4.0)
MCH: 28.7 pg (ref 26.0–34.0)
MCHC: 32 g/dL (ref 30.0–36.0)
MCV: 89.8 fL (ref 80.0–100.0)
Monocytes Absolute: 0.6 10*3/uL (ref 0.1–1.0)
Monocytes Relative: 7 %
Neutro Abs: 7.7 10*3/uL (ref 1.7–7.7)
Neutrophils Relative %: 87 %
Platelets: 199 10*3/uL (ref 150–400)
RBC: 4.7 MIL/uL (ref 4.22–5.81)
RDW: 11.9 % (ref 11.5–15.5)
WBC: 8.9 10*3/uL (ref 4.0–10.5)
nRBC: 0 % (ref 0.0–0.2)

## 2020-02-29 LAB — PHOSPHORUS: Phosphorus: 3.8 mg/dL (ref 2.5–4.6)

## 2020-02-29 LAB — C-REACTIVE PROTEIN: CRP: 8.9 mg/dL — ABNORMAL HIGH (ref <1.0)

## 2020-02-29 LAB — D-DIMER, QUANTITATIVE: D-Dimer, Quant: 1.67 ug/mL-FEU — ABNORMAL HIGH (ref 0.00–0.50)

## 2020-02-29 LAB — FERRITIN: Ferritin: 177 ng/mL (ref 24–336)

## 2020-02-29 MED ORDER — SALINE SPRAY 0.65 % NA SOLN
1.0000 | NASAL | Status: DC | PRN
  Administered 2020-02-29 – 2020-03-05 (×2): 1 via NASAL
  Filled 2020-02-29: qty 44

## 2020-02-29 NOTE — Progress Notes (Signed)
   Subjective:   Patient reports he feels better today and approximately his baseline.  He has not ambulated in the room at all.  We discussed his current treatment.  His sister, an Therapist, sports, was available by telephone and given updates.  Objective:  Vital signs in last 24 hours: Vitals:   02/28/20 1918 02/28/20 2359 02/29/20 0424 02/29/20 0808  BP: 127/75 110/65 109/67 117/71  Pulse: 85 79 78 81  Resp: (!) 24 (!) 23 (!) 25 (!) 22  Temp: 97.8 F (36.6 C) 98.3 F (36.8 C) 98 F (36.7 C) 98.7 F (37.1 C)  TempSrc: Axillary Oral Oral Oral  SpO2: 93% 93% 93% 91%  Weight:      Height:       General: Chronically ill-appearing, no acute distress Pulmonary: No wheezes, no rhonchi, increased work of breathing Cardiovascular: Regular rate rhythm no murmurs rubs or gallops Abdomen: Active bowel sounds, soft nontender Skin: Warm dry  Assessment/Plan:  Active Problems:   COVID-46  64 year old male with a history of COPD who presents with 5 days of congestion shortness of breath and nonproductive cough found to have COVID-19.  COPD exacerbation secondary to COVID-19 infection Chronic respiratory failure on 4 L submental oxygen at home Patient reports he feels at his baseline, but this is without ambulation.  CRP elevated, D-dimer elevated.  -Patient is at risk for decompensation given severe COPD.  Baseline procalcitonin was ordered and normal.  -continue Decadron and remdesivir. -Submental oxygen as needed to maintain sats 88% to 94% -Trend inflammatory markers -Incruse and Breo daily -PT OT orders in ,to start tomorrow -Pneumococcal vaccine  Prior to Admission Living Arrangement: Home Anticipated Discharge Location: Home Barriers to Discharge: Improvement from COVID-19 Dispo: Anticipated discharge with clinical improvement  Madalyn Rob, MD 02/29/2020, 11:48 AM Pager: [phone] After 5pm on weekdays and 1pm on weekends: On Call pager [phone]

## 2020-02-29 NOTE — Plan of Care (Signed)
Patient is currently resting in bed. Denies pain. Stood at side of bed to void. VSS. Remains on 4L [place]. No complaints overnight. Call bell within reach. Bed alarm on.   Problem: Education: Goal: Knowledge of General Education information will improve Description: Including pain rating scale, medication(s)/side effects and non-pharmacologic comfort measures Outcome: Progressing   Problem: Health Behavior/Discharge Planning: Goal: Ability to manage health-related needs will improve Outcome: Progressing   Problem: Clinical Measurements: Goal: Ability to maintain clinical measurements within normal limits will improve Outcome: Progressing Goal: Will remain free from infection Outcome: Progressing Goal: Diagnostic test results will improve Outcome: Progressing Goal: Respiratory complications will improve Outcome: Progressing Goal: Cardiovascular complication will be avoided Outcome: Progressing   Problem: Activity: Goal: Risk for activity intolerance will decrease Outcome: Progressing   Problem: Nutrition: Goal: Adequate nutrition will be maintained Outcome: Progressing   Problem: Coping: Goal: Level of anxiety will decrease Outcome: Progressing   Problem: Elimination: Goal: Will not experience complications related to bowel motility Outcome: Progressing Goal: Will not experience complications related to urinary retention Outcome: Progressing   Problem: Pain Managment: Goal: General experience of comfort will improve Outcome: Progressing   Problem: Safety: Goal: Ability to remain free from injury will improve Outcome: Progressing   Problem: Skin Integrity: Goal: Risk for impaired skin integrity will decrease Outcome: Progressing

## 2020-03-01 LAB — CBC WITH DIFFERENTIAL/PLATELET
Abs Immature Granulocytes: 0.04 10*3/uL (ref 0.00–0.07)
Basophils Absolute: 0 10*3/uL (ref 0.0–0.1)
Basophils Relative: 0 %
Eosinophils Absolute: 0 10*3/uL (ref 0.0–0.5)
Eosinophils Relative: 0 %
HCT: 42.5 % (ref 39.0–52.0)
Hemoglobin: 13.5 g/dL (ref 13.0–17.0)
Immature Granulocytes: 1 %
Lymphocytes Relative: 7 %
Lymphs Abs: 0.6 10*3/uL — ABNORMAL LOW (ref 0.7–4.0)
MCH: 28.7 pg (ref 26.0–34.0)
MCHC: 31.8 g/dL (ref 30.0–36.0)
MCV: 90.4 fL (ref 80.0–100.0)
Monocytes Absolute: 0.7 10*3/uL (ref 0.1–1.0)
Monocytes Relative: 9 %
Neutro Abs: 6.7 10*3/uL (ref 1.7–7.7)
Neutrophils Relative %: 83 %
Platelets: 205 10*3/uL (ref 150–400)
RBC: 4.7 MIL/uL (ref 4.22–5.81)
RDW: 12 % (ref 11.5–15.5)
WBC: 8 10*3/uL (ref 4.0–10.5)
nRBC: 0 % (ref 0.0–0.2)

## 2020-03-01 LAB — COMPREHENSIVE METABOLIC PANEL
ALT: 20 U/L (ref 0–44)
AST: 27 U/L (ref 15–41)
Albumin: 2.9 g/dL — ABNORMAL LOW (ref 3.5–5.0)
Alkaline Phosphatase: 58 U/L (ref 38–126)
Anion gap: 9 (ref 5–15)
BUN: 24 mg/dL — ABNORMAL HIGH (ref 8–23)
CO2: 33 mmol/L — ABNORMAL HIGH (ref 22–32)
Calcium: 8.8 mg/dL — ABNORMAL LOW (ref 8.9–10.3)
Chloride: 97 mmol/L — ABNORMAL LOW (ref 98–111)
Creatinine, Ser: 0.87 mg/dL (ref 0.61–1.24)
GFR, Estimated: >60 mL/min (ref >60)
Glucose, Bld: 133 mg/dL — ABNORMAL HIGH (ref 70–99)
Potassium: 4.3 mmol/L (ref 3.5–5.1)
Sodium: 139 mmol/L (ref 135–145)
Total Bilirubin: 0.5 mg/dL (ref 0.3–1.2)
Total Protein: 6.6 g/dL (ref 6.5–8.1)

## 2020-03-01 LAB — PHOSPHORUS: Phosphorus: 3.4 mg/dL (ref 2.5–4.6)

## 2020-03-01 LAB — D-DIMER, QUANTITATIVE: D-Dimer, Quant: 1.5 ug/mL-FEU — ABNORMAL HIGH (ref 0.00–0.50)

## 2020-03-01 LAB — C-REACTIVE PROTEIN: CRP: 6.3 mg/dL — ABNORMAL HIGH (ref <1.0)

## 2020-03-01 LAB — MAGNESIUM: Magnesium: 2.1 mg/dL (ref 1.7–2.4)

## 2020-03-01 LAB — FERRITIN: Ferritin: 252 ng/mL (ref 24–336)

## 2020-03-01 MED ORDER — ALBUTEROL SULFATE HFA 108 (90 BASE) MCG/ACT IN AERS
1.0000 | INHALATION_SPRAY | RESPIRATORY_TRACT | Status: DC | PRN
  Administered 2020-03-01 – 2020-03-06 (×7): 2 via RESPIRATORY_TRACT
  Filled 2020-03-01: qty 6.7

## 2020-03-01 NOTE — Progress Notes (Signed)
PT Cancellation Note  Patient Details Name: Frank [name] MRN: [id number] DOB: 01/09/1956   Cancelled Treatment:    Reason Eval/Treat Not Completed: Medical issues which prohibited therapy  Evaluation attempted, however pt with resting sats 87%, increased WOB, and MD just left room having increased his oxygen. Educated on pursed lip breathing. Pt requesting rescue inhaler (RN notified).   Noted order for IS and flutter valve with pt reporting he has had neither issued this admission. Unit secretary notified to order flutter valve and IS issued. Patient familiar with use and demonstrated ability to pull 1500 ml.   Will return to complete evaluation after pt has inhaler and chance to improve resting sats and decr WOB.    Arby Barrette, PT Pager [phone]   Rexanne Mano 03/01/2020, 11:36 AM

## 2020-03-01 NOTE — Evaluation (Signed)
Occupational Therapy Evaluation Patient Details Name: Frank [name] MRN: [id number] DOB: 1955/12/13 Today's Date: 03/01/2020    History of Present Illness 64 yo male presenting to ED with shortness of breath and non-productive cough. Tested COVID-19 positive; vaccinated. PMH including COPD with 4L home O2, Hypoxemia, Benign prostatic hyperplasia , and chronic respiratory failure.   Clinical Impression   PTA, pt was living alone and was independent with BADLs; sister recently has been performing IADLs and driving. Uses 4L home O2. Currently, pt requires Min guard A for LB ADLs and functional mobility. Provided education on purse lip breathing patterns; pt demonstrating understanding and lowering RR with cues. Pt performing functional mobility to/from EOB and door twice. SpO2 >90% on 6L, RR 20-30s, and HR 80-90s. Pt agreeable to prone position at end of session. SpO2 100% on 5L, HR 80s, and RR 20s. Notified RN. Pt would benefit from further acute OT to facilitate safe dc. Recommend dc to home with HHOT for further OT to optimize safety, independence with ADLs, and return to PLOF.     Follow Up Recommendations  Home health OT;Supervision/Assistance - 24 hour    Equipment Recommendations  3 in 1 bedside commode (As shower seat)    Recommendations for Other Services PT consult     Precautions / Restrictions Precautions Precautions: Fall      Mobility Bed Mobility Overal bed mobility: Needs Assistance Bed Mobility: Supine to Sit;Sit to Sidelying;Rolling Rolling: Supervision   Supine to sit: Supervision   Sit to sidelying: Supervision General bed mobility comments: Supervision for safety. Increased time due to fatigue. Pt agreeable to prone position at end of session    Transfers Overall transfer level: Needs assistance Equipment used: None Transfers: Sit to/from Stand Sit to Stand: Min guard         General transfer comment: Min Guard A for safety    Balance Overall  balance assessment: Needs assistance Sitting-balance support: No upper extremity supported;Feet supported Sitting balance-Leahy Scale: Good     Standing balance support: No upper extremity supported;During functional activity Standing balance-Leahy Scale: Good                             ADL either performed or assessed with clinical judgement   ADL Overall ADL's : Needs assistance/impaired Eating/Feeding: Set up;Sitting   Grooming: Supervision/safety;Set up;Sitting   Upper Body Bathing: Supervision/ safety;Set up;Sitting   Lower Body Bathing: Min guard;Sit to/from stand   Upper Body Dressing : Supervision/safety;Set up;Sitting   Lower Body Dressing: Min guard;Sit to/from stand Lower Body Dressing Details (indicate cue type and reason): Pt able to bend forward to adjust socks Toilet Transfer: Min guard;Ambulation (simulated in room)           Functional mobility during ADLs: Min guard General ADL Comments: Pt presenting with decreased strength and activity tolerance impacting his safe performance of ADLs. Pt performing functional mobility to/from bed and door; x2. Min guard A. SpO2 >90% on 6L O2 via HFNC     Vision Baseline Vision/History: Wears glasses Wears Glasses: Reading only Patient Visual Report: No change from baseline       Perception     Praxis      Pertinent Vitals/Pain Pain Assessment: Faces Faces Pain Scale: Hurts little more Pain Location: "Bottom" Pain Descriptors / Indicators: Discomfort Pain Intervention(s): Monitored during session;Limited activity within patient's tolerance;Repositioned     Hand Dominance     Extremity/Trunk Assessment Upper Extremity Assessment Upper Extremity  Assessment: Overall WFL for tasks assessed   Lower Extremity Assessment Lower Extremity Assessment: Defer to PT evaluation   Cervical / Trunk Assessment Cervical / Trunk Assessment: Normal   Communication Communication Communication: Other  (comment) (limited by dyspnea)   Cognition Arousal/Alertness: Awake/alert Behavior During Therapy: WFL for tasks assessed/performed Overall Cognitive Status: Within Functional Limits for tasks assessed                                 General Comments: Motivated despite fatigue   General Comments  on 6L at rest 90%, HR 82, RR 23. During mobility (to/from door), SpO2 97-92% on 6L, RR 24-30, and HR 90s. prone position 5L sats 100%, RR 42, HR 85    Exercises Exercises: Other exercises Other Exercises Other Exercises: Instructed in PLB for raising O2 sats (up to 90% when not talking)   Shoulder Instructions      Home Living Family/patient expects to be discharged to:: Private residence Living Arrangements: Alone   Type of Home: House Home Access: Level entry     Home Layout: Two level;Laundry or work area in basement     [employer] Shower/Tub: Teacher, early years/pre: Handicapped height     Home Equipment: Radio producer - single point (canes belonged to others, he has never used)   Additional Comments: uses 4L home O2      Prior Functioning/Environment Level of Independence: Needs assistance  Gait / Transfers Assistance Needed: no device;  ADL's / Homemaking Assistance Needed: sister has been doing some of his cooking and cleaning recently; does not drive            OT Problem List: Decreased strength;Decreased activity tolerance;Impaired balance (sitting and/or standing);Decreased knowledge of use of DME or AE;Decreased knowledge of precautions;Cardiopulmonary status limiting activity      OT Treatment/Interventions: Self-care/ADL training;Therapeutic exercise;Energy conservation;DME and/or AE instruction;Therapeutic activities;Patient/family education    OT Goals(Current goals can be found in the care plan section) Acute Rehab OT Goals Patient Stated Goal: Go home OT Goal Formulation: With patient Time For Goal Achievement: 03/15/20 Potential to  Achieve Goals: Good  OT Frequency: Min 3X/week   Barriers to D/C:            Co-evaluation              AM-PAC OT "6 Clicks" Daily Activity     Outcome Measure Help from another person eating meals?: A Little Help from another person taking care of personal grooming?: A Little Help from another person toileting, which includes using toliet, bedpan, or urinal?: A Little Help from another person bathing (including washing, rinsing, drying)?: A Little Help from another person to put on and taking off regular upper body clothing?: A Little Help from another person to put on and taking off regular lower body clothing?: A Little 6 Click Score: 18   End of Session Equipment Utilized During Treatment: Oxygen (6L) Nurse Communication: Mobility status;Other (comment) (IV at R hand bleeding; prone positioning at end of session)  Activity Tolerance: Patient tolerated treatment well Patient left: in bed;with call bell/phone within reach  OT Visit Diagnosis: Unsteadiness on feet (R26.81);Other abnormalities of gait and mobility (R26.89);Muscle weakness (generalized) (M62.81);Pain Pain - part of body:  (Buttocks)                Time: 2725-3664 OT Time Calculation (min): 40 min Charges:  OT General Charges $OT Visit: 1 Visit OT Evaluation $  OT Eval Moderate Complexity: 1 Mod  Syndey Jaskolski MSOT, OTR/L Acute Rehab Pager: [phone] Office: [place] 03/01/2020, 4:39 PM

## 2020-03-01 NOTE — Progress Notes (Signed)
   Subjective:   Patient reports he is feeling well and sitting up in chair.  His nasal cannula is not on correctly but repositioned.  Saturating 85% and supplemental oxygen turned up to 5 L to maintain saturation of  88%. Denies any acute changes overnight.   Patient's sister was updated by phone in patient room  Objective:  Vital signs in last 24 hours: Vitals:   02/29/20 2148 03/01/20 0458 03/01/20 1149 03/01/20 1407  BP: 119/63 126/77  117/70  Pulse: 77 74 72 88  Resp: 20 17 (!) 21 18  Temp: 98.6 F (37 C) 98.4 F (36.9 C)  98.6 F (37 C)  TempSrc: Oral Oral  Axillary  SpO2: 100% 93% (!) 89% 91%  Weight:      Height:       General: Chronically ill-appearing, no acute distress Pulmonary: No wheezes, no rhonchi, has increased work of breathing, satting 88% on 5 L Cardiovascular: Regular rate rhythm no murmurs rubs or gallops Abdomen: Active bowel sounds, soft nontender Skin: Warm dry  Assessment/Plan:  Active Problems:   Chronic respiratory failure with hypoxia, on home O2 therapy (HCC)   COPD (chronic obstructive pulmonary disease) ([place])   COVID-46  64 year old male with a history of COPD who presents with 5 days of congestion shortness of breath and nonproductive cough found to have COVID-19.  COPD exacerbation secondary to COVID-19 infection Chronic respiratory failure on 4 L submental oxygen at home Admitted 10/23 with positive Covid test. Patient is reporting he feels at baseline, but still has not ambulated in his room.  On exam his work of breathing is more than yesterday. Patient is at risk for decompensation given severe COPD and will benefit from continued hospital care. CRP and D-dimer trending down and I take this as a positive sign.  Looking at orders pneumococcal 23 vaccine was ordered and held due to contraindication during acute illness. - Continue Decadron and remdesivir. - Submental oxygen as needed to maintain sats 88% to 94% -Trend inflammatory  markers -Incruse and Breo daily - PT/OT -Pneumococcal vaccine  Prior to Admission Living Arrangement: Home Anticipated Discharge Location: Home Barriers to Discharge: Improvement from COVID-19 Dispo: Anticipated discharge with clinical improvement  Madalyn Rob, MD 03/01/2020, 2:39 PM Pager: [phone] After 5pm on weekdays and 1pm on weekends: On Call pager [phone]

## 2020-03-01 NOTE — Telephone Encounter (Signed)
Patient is currently admitted at [facility]. Request for wheelchair should be handled by Inpatient Team. Hubbard Hartshorn, BSN, RN-BC

## 2020-03-01 NOTE — Evaluation (Signed)
Physical Therapy Evaluation Patient Details Name: Frank [name] MRN: [id number] DOB: 08/27/1955 Today's Date: 03/01/2020   History of Present Illness  64 yo male presenting to ED with shortness of breath and non-productive cough. Tested COVID-19 positive; vaccinated. PMH including COPD with 4L home O2, Hypoxemia, Benign prostatic hyperplasia , and chronic respiratory failure.  Clinical Impression   Pt admitted with above diagnosis. Patient was living alone and mostly independent PTA (daughter assisted with housework and grocery shopping). Today he was able to walk on 6L O2 with minguard assist + second person for lines/monitors up to 25 ft at a time before seated rest. Lowest sats 92% with max RR 44. Patient able to slow his breathing down to mid 30s with concentrating on pursed lip breathing. Patient very motivated and even willing to try prone positioning (he was tolerating well at end of session). Pt currently with functional limitations due to the deficits listed below (see PT Problem List). Pt will benefit from skilled PT to increase their independence and safety with mobility to allow discharge to the venue listed below.       Follow Up Recommendations Home health PT    Equipment Recommendations  Other (comment) (TBD as progresses)    Recommendations for Other Services OT consult     Precautions / Restrictions Precautions Precautions: Fall      Mobility  Bed Mobility Overal bed mobility: Needs Assistance Bed Mobility: Supine to Sit;Sit to Sidelying;Rolling Rolling: Supervision   Supine to sit: Supervision   Sit to sidelying: Supervision General bed mobility comments: Supervision for safety. Increased time due to fatigue. Pt agreeable to prone position at end of session. Required min assist for proning    Transfers Overall transfer level: Needs assistance Equipment used: None Transfers: Sit to/from Stand Sit to Stand: Min guard         General transfer comment:  Min Guard A for safety/lines  Ambulation/Gait Ambulation/Gait assistance: Min guard;+2 safety/equipment Gait Distance (Feet): 25 Feet (seated rest, 25 ft) Assistive device: 1 person hand held assist Gait Pattern/deviations: Step-through pattern;Decreased stride length;Wide base of support   Gait velocity interpretation: <1.8 ft/sec, indicate of risk for recurrent falls General Gait Details: pt utilizing wider BOS due to slower velocity due to resp status; no reaching for UE support  Stairs            Wheelchair Mobility    Modified Rankin (Stroke Patients Only)       Balance Overall balance assessment: Needs assistance Sitting-balance support: No upper extremity supported;Feet supported Sitting balance-Leahy Scale: Good     Standing balance support: No upper extremity supported;During functional activity Standing balance-Leahy Scale: Good                               Pertinent Vitals/Pain Pain Assessment: Faces Faces Pain Scale: Hurts little more Pain Location: "Bottom" Pain Descriptors / Indicators: Discomfort Pain Intervention(s): Monitored during session;Repositioned    Home Living Family/patient expects to be discharged to:: Private residence Living Arrangements: Alone Available Help at Discharge: Family;Available PRN/intermittently Type of Home: House Home Access: Level entry     Home Layout: Two level;Laundry or work area in basement (enters on main level; doesn't use basement) Home Equipment: Kasandra Knudsen - single point (canes belonged to others, he has never used) Additional Comments: uses 4L home O2    Prior Function Level of Independence: Needs assistance   Gait / Transfers Assistance Needed: no device;  ADL's / Homemaking Assistance Needed: sister has been doing some of his cooking and cleaning recently; does not drive        Hand Dominance        Extremity/Trunk Assessment   Upper Extremity Assessment Upper Extremity Assessment:  Defer to OT evaluation    Lower Extremity Assessment Lower Extremity Assessment: Overall WFL for tasks assessed    Cervical / Trunk Assessment Cervical / Trunk Assessment: Normal  Communication   Communication: Other (comment) (limited by dyspnea)  Cognition Arousal/Alertness: Awake/alert Behavior During Therapy: WFL for tasks assessed/performed Overall Cognitive Status: Within Functional Limits for tasks assessed                                 General Comments: Motivated despite fatigue      General Comments General comments (skin integrity, edema, etc.): on 6L at rest 90%, HR 82, RR 23. During mobility (to/from door), SpO2 97-92% on 6L, RR 24-30, and HR 90s. prone position 5L sats 100%, RR 42, HR 85    Exercises Other Exercises Other Exercises: Instructed in PLB for raising O2 sats (up to 90% when not talking) Other Exercises: Instructed in use of IS; pt able to pull 1000 ml   Assessment/Plan    PT Assessment Patient needs continued PT services  PT Problem List Decreased activity tolerance;Decreased balance;Decreased mobility;Decreased knowledge of use of DME;Cardiopulmonary status limiting activity       PT Treatment Interventions DME instruction;Gait training;Functional mobility training;Therapeutic activities;Therapeutic exercise;Patient/family education;Balance training    PT Goals (Current goals can be found in the Care Plan section)  Acute Rehab PT Goals Patient Stated Goal: Go home PT Goal Formulation: With patient Time For Goal Achievement: 03/15/20 Potential to Achieve Goals: Fair    Frequency Min 3X/week   Barriers to discharge        Co-evaluation               AM-PAC PT "6 Clicks" Mobility  Outcome Measure Help needed turning from your back to your side while in a flat bed without using bedrails?: None Help needed moving from lying on your back to sitting on the side of a flat bed without using bedrails?: None Help needed  moving to and from a bed to a chair (including a wheelchair)?: A Little Help needed standing up from a chair using your arms (e.g., wheelchair or bedside chair)?: A Little Help needed to walk in hospital room?: A Little Help needed climbing 3-5 steps with a railing? : A Little 6 Click Score: 20    End of Session Equipment Utilized During Treatment: Oxygen Activity Tolerance: Patient limited by fatigue Patient left: with call bell/phone within reach;in bed;with nursing/sitter in room Nurse Communication: Other (comment);Mobility status (pt prone) PT Visit Diagnosis: Difficulty in walking, not elsewhere classified (R26.2);Muscle weakness (generalized) (M62.81)    Time: 7408-1448 PT Time Calculation (min) (ACUTE ONLY): 50 min   Charges:   PT Evaluation $PT Eval Low Complexity: 1 Low PT Treatments $Therapeutic Exercise: 8-22 mins         Arby Barrette, PT Pager [phone]   Rexanne Mano 03/01/2020, 4:53 PM

## 2020-03-02 DIAGNOSIS — U071 COVID-19: Principal | ICD-10-CM

## 2020-03-02 LAB — CBC WITH DIFFERENTIAL/PLATELET
Abs Immature Granulocytes: 0.04 10*3/uL (ref 0.00–0.07)
Basophils Absolute: 0 10*3/uL (ref 0.0–0.1)
Basophils Relative: 0 %
Eosinophils Absolute: 0 10*3/uL (ref 0.0–0.5)
Eosinophils Relative: 0 %
HCT: 43 % (ref 39.0–52.0)
Hemoglobin: 13.8 g/dL (ref 13.0–17.0)
Immature Granulocytes: 1 %
Lymphocytes Relative: 12 %
Lymphs Abs: 0.7 10*3/uL (ref 0.7–4.0)
MCH: 28.8 pg (ref 26.0–34.0)
MCHC: 32.1 g/dL (ref 30.0–36.0)
MCV: 89.6 fL (ref 80.0–100.0)
Monocytes Absolute: 0.4 10*3/uL (ref 0.1–1.0)
Monocytes Relative: 7 %
Neutro Abs: 4.5 10*3/uL (ref 1.7–7.7)
Neutrophils Relative %: 80 %
Platelets: 204 10*3/uL (ref 150–400)
RBC: 4.8 MIL/uL (ref 4.22–5.81)
RDW: 11.9 % (ref 11.5–15.5)
WBC: 5.5 10*3/uL (ref 4.0–10.5)
nRBC: 0 % (ref 0.0–0.2)

## 2020-03-02 LAB — COMPREHENSIVE METABOLIC PANEL
ALT: 35 U/L (ref 0–44)
AST: 48 U/L — ABNORMAL HIGH (ref 15–41)
Albumin: 2.8 g/dL — ABNORMAL LOW (ref 3.5–5.0)
Alkaline Phosphatase: 58 U/L (ref 38–126)
Anion gap: 11 (ref 5–15)
BUN: 22 mg/dL (ref 8–23)
CO2: 30 mmol/L (ref 22–32)
Calcium: 8.6 mg/dL — ABNORMAL LOW (ref 8.9–10.3)
Chloride: 96 mmol/L — ABNORMAL LOW (ref 98–111)
Creatinine, Ser: 0.78 mg/dL (ref 0.61–1.24)
GFR, Estimated: >60 mL/min (ref >60)
Glucose, Bld: 129 mg/dL — ABNORMAL HIGH (ref 70–99)
Potassium: 4.8 mmol/L (ref 3.5–5.1)
Sodium: 137 mmol/L (ref 135–145)
Total Bilirubin: 0.5 mg/dL (ref 0.3–1.2)
Total Protein: 5.6 g/dL — ABNORMAL LOW (ref 6.5–8.1)

## 2020-03-02 LAB — PHOSPHORUS: Phosphorus: 3.6 mg/dL (ref 2.5–4.6)

## 2020-03-02 LAB — C-REACTIVE PROTEIN: CRP: 2.8 mg/dL — ABNORMAL HIGH (ref <1.0)

## 2020-03-02 LAB — FERRITIN: Ferritin: 302 ng/mL (ref 24–336)

## 2020-03-02 LAB — D-DIMER, QUANTITATIVE: D-Dimer, Quant: 1.26 ug/mL-FEU — ABNORMAL HIGH (ref 0.00–0.50)

## 2020-03-02 LAB — MAGNESIUM: Magnesium: 2 mg/dL (ref 1.7–2.4)

## 2020-03-02 NOTE — Telephone Encounter (Signed)
Spoke with pt's sister [name] (per DPR) to inform them that [name] forwarded [facility] inquiry to pt's PCP. Pt's sister stated that pt is currently admitted to hospital. I informed pt's sister she should reach out to social worker currently working with pt and inquire about [facility] for home use. Pt's sister stated understanding. Nothing further needed at this time.

## 2020-03-02 NOTE — Progress Notes (Signed)
Physical Therapy Treatment Patient Details Name: Frank [name] MRN: [id number] DOB: 05-07-1956 Today's Date: 03/02/2020    History of Present Illness 64 yo male presenting to ED with shortness of breath and non-productive cough. Tested COVID-19 positive; vaccinated. PMH including COPD with 4L home O2, Hypoxemia, Benign prostatic hyperplasia , and chronic respiratory failure.    PT Comments    Patient tolerated incr ambulation distance with sats 93% on 6L O2 (patient on 5L at rest; tank only has 4 vs 6 L). His RR increased to 38 bpm during gait and required prolonged seated rest with pursed lip breathing between bouts of ambulation. Did well with sit to stand exercise without use of UEs (including slow descent to sitting) x 5 reps with rest breaks.     Follow Up Recommendations  Home health PT     Equipment Recommendations  Other (comment) (TBD as progresses)    Recommendations for Other Services       Precautions / Restrictions Precautions Precautions: Fall    Mobility  Bed Mobility                  Transfers Overall transfer level: Needs assistance Equipment used: None Transfers: Sit to/from Stand Sit to Stand: Supervision         General transfer comment: supervision for lines  Ambulation/Gait Ambulation/Gait assistance: Min assist Gait Distance (Feet): 50 Feet (x 2) Assistive device: 1 person hand held assist Gait Pattern/deviations: Step-through pattern;Decreased stride length;Wide base of support;Staggering right Gait velocity: decr   General Gait Details: turning left with staggering LOB to his right x1; pt encouraged to use wider BOS due to slower velocity due to resp status; no reaching for UE support   Stairs             Wheelchair Mobility    Modified Rankin (Stroke Patients Only)       Balance Overall balance assessment: Needs assistance Sitting-balance support: No upper extremity supported;Feet supported Sitting balance-Leahy  Scale: Good     Standing balance support: No upper extremity supported;During functional activity Standing balance-Leahy Scale: Fair                              Cognition Arousal/Alertness: Awake/alert Behavior During Therapy: WFL for tasks assessed/performed Overall Cognitive Status: Within Functional Limits for tasks assessed                                        Exercises Other Exercises Other Exercises: sit to stand without use of UEs x 5 reps    General Comments General comments (skin integrity, edema, etc.): at rest on 5L 96% RR 28; ambulated on 6L with lowest sats 93% however RR up to 38 with incr time to slow breathing once seated for rest      Pertinent Vitals/Pain Pain Assessment: No/denies pain    Home Living                      Prior Function            PT Goals (current goals can now be found in the care plan section) Acute Rehab PT Goals Patient Stated Goal: Go home Time For Goal Achievement: 03/15/20 Potential to Achieve Goals: Fair Progress towards PT goals: Progressing toward goals    Frequency    Min 3X/week  PT Plan Current plan remains appropriate    Co-evaluation              AM-PAC PT "6 Clicks" Mobility   Outcome Measure  Help needed turning from your back to your side while in a flat bed without using bedrails?: None Help needed moving from lying on your back to sitting on the side of a flat bed without using bedrails?: None Help needed moving to and from a bed to a chair (including a wheelchair)?: A Little Help needed standing up from a chair using your arms (e.g., wheelchair or bedside chair)?: A Little Help needed to walk in hospital room?: A Little Help needed climbing 3-5 steps with a railing? : A Little 6 Click Score: 20    End of Session Equipment Utilized During Treatment: Oxygen Activity Tolerance: Treatment limited secondary to medical complications (Comment) (incr RR and  WOB during gait ) Patient left: with call bell/phone within reach;in chair Nurse Communication: Mobility status PT Visit Diagnosis: Difficulty in walking, not elsewhere classified (R26.2);Muscle weakness (generalized) (M62.81)     Time: 1526-1600 PT Time Calculation (min) (ACUTE ONLY): 34 min  Charges:  $Gait Training: 23-37 mins                      Arby Barrette, PT Pager [phone]    Rexanne Mano 03/02/2020, 5:19 PM

## 2020-03-02 NOTE — Progress Notes (Signed)
  Date: 03/02/2020  Patient name: FITZPATRICK ALBERICO  Medical record number: [id number]  Date of birth: Feb 22, 1956    Subjective: Breathing about the same. Nose is a little stuffy. Felt weak getting up with PT.  Objective:  Vital signs in last 24 hours: Vitals:   03/01/20 1407 03/01/20 2102 03/02/20 0524 03/02/20 1341  BP: 117/70 128/71 125/79 137/78  Pulse: 88 75 70 76  Resp: 18 20 20 20   Temp: 98.6 F (37 C) 98 F (36.7 C) 98 F (36.7 C) 98.1 F (36.7 C)  TempSrc: Axillary Oral Oral Axillary  SpO2: 91% 98% 95% 96%  Weight:      Height:        Physical Exam: Gen: Sitting in chair, mildly increased work of breathing HEENT: [place] in place, no obvious rhinorrhea CV: RRR, no m/r/g Pulm: Expiratory wheezing throughout, prolonged expiratory phase, mild paradoxical abdominal movement Abd: Soft, non-tender, normal BS Extr: Warm, well perfused, no edema Neuro: No focal deficits  Significant new test results: AST/ALT 48/35 CRP 8.9->6.3->2.8  Assessment/Plan:  Principal Problem:   COVID-19 Active Problems:   Chronic respiratory failure with hypoxia, on home O2 therapy (HCC)   COPD (chronic obstructive pulmonary disease) (HCC)  64 year old male with a history of COPD admitted for COVID-19 pneumonia, not improving but not deteriorating.  Acute on chronic hypoxic respiratory failure and COPD exacerbation due to COVID-19 pneumonia Still appears somewhat dyspneic, not at baseline, requiring more O2. At risk for decompensation, will continue remdesivir until back to baseline or course complete. Reassuring his inflammatory markers are improving - Continue dexamethasone and remdesivir, day 4/10 and 4/5 - Supplemental oxygen as needed to maintain sats 88% to 94% - Trend inflammatory markers - Incruse and Breo daily, albuterol prn - PT/OT  Prior to Admission Living Arrangement: Home Anticipated Discharge Location: Home Barriers to Discharge: Improvement from COVID-19 Dispo:  Anticipated discharge with clinical improvement  Lenice Pressman, M.D., Ph.D. 03/02/2020, 5:56 PM

## 2020-03-02 NOTE — Progress Notes (Signed)
[facility]--[facility] of [facility]--Pt is active with [facility] services for COPD.  His NOK is sister Romie Minus who assists pt with care needs but pt lives alone.  Will continue to follow hospital course.  Please contact [facility] if we can assist with d/c planning.  Will plan to resume [facility] support upon d/c back home.  Thank you Office [phone] Mobile [phone]

## 2020-03-02 NOTE — Telephone Encounter (Signed)
Looks like a nurse needs to call them and say its been routed to the PCP

## 2020-03-03 LAB — PHOSPHORUS: Phosphorus: 3.6 mg/dL (ref 2.5–4.6)

## 2020-03-03 LAB — COMPREHENSIVE METABOLIC PANEL
ALT: 99 U/L — ABNORMAL HIGH (ref 0–44)
AST: 113 U/L — ABNORMAL HIGH (ref 15–41)
Albumin: 3.1 g/dL — ABNORMAL LOW (ref 3.5–5.0)
Alkaline Phosphatase: 60 U/L (ref 38–126)
Anion gap: 10 (ref 5–15)
BUN: 24 mg/dL — ABNORMAL HIGH (ref 8–23)
CO2: 31 mmol/L (ref 22–32)
Calcium: 9.1 mg/dL (ref 8.9–10.3)
Chloride: 97 mmol/L — ABNORMAL LOW (ref 98–111)
Creatinine, Ser: 0.86 mg/dL (ref 0.61–1.24)
GFR, Estimated: >60 mL/min (ref >60)
Glucose, Bld: 135 mg/dL — ABNORMAL HIGH (ref 70–99)
Potassium: 4.4 mmol/L (ref 3.5–5.1)
Sodium: 138 mmol/L (ref 135–145)
Total Bilirubin: 0.7 mg/dL (ref 0.3–1.2)
Total Protein: 6.9 g/dL (ref 6.5–8.1)

## 2020-03-03 LAB — CBC WITH DIFFERENTIAL/PLATELET
Abs Immature Granulocytes: 0.04 10*3/uL (ref 0.00–0.07)
Basophils Absolute: 0 10*3/uL (ref 0.0–0.1)
Basophils Relative: 0 %
Eosinophils Absolute: 0.2 10*3/uL (ref 0.0–0.5)
Eosinophils Relative: 3 %
HCT: 46.4 % (ref 39.0–52.0)
Hemoglobin: 14.7 g/dL (ref 13.0–17.0)
Immature Granulocytes: 1 %
Lymphocytes Relative: 14 %
Lymphs Abs: 0.8 10*3/uL (ref 0.7–4.0)
MCH: 28 pg (ref 26.0–34.0)
MCHC: 31.7 g/dL (ref 30.0–36.0)
MCV: 88.4 fL (ref 80.0–100.0)
Monocytes Absolute: 0.3 10*3/uL (ref 0.1–1.0)
Monocytes Relative: 5 %
Neutro Abs: 4.6 10*3/uL (ref 1.7–7.7)
Neutrophils Relative %: 77 %
Platelets: 230 10*3/uL (ref 150–400)
RBC: 5.25 MIL/uL (ref 4.22–5.81)
RDW: 12 % (ref 11.5–15.5)
WBC: 5.9 10*3/uL (ref 4.0–10.5)
nRBC: 0 % (ref 0.0–0.2)

## 2020-03-03 LAB — D-DIMER, QUANTITATIVE: D-Dimer, Quant: 1.25 ug/mL-FEU — ABNORMAL HIGH (ref 0.00–0.50)

## 2020-03-03 LAB — FERRITIN: Ferritin: 343 ng/mL — ABNORMAL HIGH (ref 24–336)

## 2020-03-03 LAB — C-REACTIVE PROTEIN: CRP: 2 mg/dL — ABNORMAL HIGH (ref <1.0)

## 2020-03-03 LAB — MAGNESIUM: Magnesium: 2 mg/dL (ref 1.7–2.4)

## 2020-03-03 NOTE — Progress Notes (Addendum)
  Date: 03/03/2020  Patient name: Frank [name]  Medical record number: [id number]  Date of birth: 12/31/1955    Subjective:   Still feeling kind of congested, but overall feeling better. Still reports shortness of breath with ambulation.  Spoke with patient's sister, asking for a wheelchair for the patient upon discharge.  Objective:  Vital signs in last 24 hours: Vitals:   03/02/20 0524 03/02/20 1341 03/02/20 2057 03/03/20 0457  BP: 125/79 137/78 (!) 142/83 125/74  Pulse: 70 76 75 79  Resp: 20 20 20  (!) 25  Temp: 98 F (36.7 C) 98.1 F (36.7 C) 98 F (36.7 C) 97.8 F (36.6 C)  TempSrc: Oral Axillary Oral Oral  SpO2: 95% 96% 97% 96%  Weight:      Height:       General: Sitting in chair, on [place], NAD CV: normal rate and regular rhythm, no m/r/g Pulm: b/l rhonchi noted Abdomen: soft, nontender, nondistended, +BS Extremities: warm and well perfused, no edema Neuro: AAOx3  Significant new test results: D-dimer: 1.26 --> 1.25 CRP: 2.8 --> 2  Assessment/Plan:  Principal Problem:   COVID-19 Active Problems:   Chronic respiratory failure with hypoxia, on home O2 therapy (HCC)   COPD (chronic obstructive pulmonary disease) (HCC)  64 year old male with a history of COPD admitted for COVID-19 pneumonia, slowly improving.   Acute on chronic hypoxic respiratory failure and COPD exacerbation due to COVID-19 pneumonia Inflammatory markers trending down. On last day of remdesivir. He is back on his home O2 requirement of 4L [place], satting >92% at rest but desaturates and short of breath with ambulation or any movement. Not yet at baseline respiratory status or oxygenation, will continue IV dexamethasone. - on day 5/10 of decadron - Supplemental oxygen as needed to maintain sats 88% to 94% - Trend inflammatory markers - Incruse and Breo daily, albuterol prn - continue PT/OT   Prior to Admission Living Arrangement: Home Anticipated Discharge Location: Home Barriers to  Discharge: continue medical management Dispo: TBD   Virl Axe, MD 03/03/2020, 1:17 PM Pager: [phone]

## 2020-03-04 LAB — COMPREHENSIVE METABOLIC PANEL
ALT: 81 U/L — ABNORMAL HIGH (ref 0–44)
AST: 55 U/L — ABNORMAL HIGH (ref 15–41)
Albumin: 3 g/dL — ABNORMAL LOW (ref 3.5–5.0)
Alkaline Phosphatase: 59 U/L (ref 38–126)
Anion gap: 9 (ref 5–15)
BUN: 25 mg/dL — ABNORMAL HIGH (ref 8–23)
CO2: 31 mmol/L (ref 22–32)
Calcium: 8.8 mg/dL — ABNORMAL LOW (ref 8.9–10.3)
Chloride: 97 mmol/L — ABNORMAL LOW (ref 98–111)
Creatinine, Ser: 0.92 mg/dL (ref 0.61–1.24)
GFR, Estimated: >60 mL/min (ref >60)
Glucose, Bld: 146 mg/dL — ABNORMAL HIGH (ref 70–99)
Potassium: 4.5 mmol/L (ref 3.5–5.1)
Sodium: 137 mmol/L (ref 135–145)
Total Bilirubin: 0.6 mg/dL (ref 0.3–1.2)
Total Protein: 6.6 g/dL (ref 6.5–8.1)

## 2020-03-04 LAB — CBC WITH DIFFERENTIAL/PLATELET
Abs Immature Granulocytes: 0.08 10*3/uL — ABNORMAL HIGH (ref 0.00–0.07)
Basophils Absolute: 0 10*3/uL (ref 0.0–0.1)
Basophils Relative: 0 %
Eosinophils Absolute: 0 10*3/uL (ref 0.0–0.5)
Eosinophils Relative: 0 %
HCT: 45.2 % (ref 39.0–52.0)
Hemoglobin: 14.3 g/dL (ref 13.0–17.0)
Immature Granulocytes: 1 %
Lymphocytes Relative: 12 %
Lymphs Abs: 0.8 10*3/uL (ref 0.7–4.0)
MCH: 28.1 pg (ref 26.0–34.0)
MCHC: 31.6 g/dL (ref 30.0–36.0)
MCV: 88.8 fL (ref 80.0–100.0)
Monocytes Absolute: 0.2 10*3/uL (ref 0.1–1.0)
Monocytes Relative: 4 %
Neutro Abs: 5.4 10*3/uL (ref 1.7–7.7)
Neutrophils Relative %: 83 %
Platelets: 234 10*3/uL (ref 150–400)
RBC: 5.09 MIL/uL (ref 4.22–5.81)
RDW: 11.9 % (ref 11.5–15.5)
WBC: 6.5 10*3/uL (ref 4.0–10.5)
nRBC: 0 % (ref 0.0–0.2)

## 2020-03-04 LAB — CULTURE, BLOOD (ROUTINE X 2)
Culture: NO GROWTH
Culture: NO GROWTH

## 2020-03-04 LAB — FERRITIN: Ferritin: 238 ng/mL (ref 24–336)

## 2020-03-04 LAB — PHOSPHORUS: Phosphorus: 3.4 mg/dL (ref 2.5–4.6)

## 2020-03-04 LAB — C-REACTIVE PROTEIN: CRP: 1 mg/dL — ABNORMAL HIGH (ref <1.0)

## 2020-03-04 LAB — D-DIMER, QUANTITATIVE: D-Dimer, Quant: 1.01 ug/mL-FEU — ABNORMAL HIGH (ref 0.00–0.50)

## 2020-03-04 LAB — MAGNESIUM: Magnesium: 2.1 mg/dL (ref 1.7–2.4)

## 2020-03-04 MED ORDER — ONDANSETRON HCL 4 MG/2ML IJ SOLN: 4.0000 mg | Freq: Three times a day (TID) | INTRAMUSCULAR | Status: DC | PRN

## 2020-03-04 MED ORDER — GUAIFENESIN-DM 100-10 MG/5ML PO SYRP
10.0000 mL | ORAL_SOLUTION | Freq: Three times a day (TID) | ORAL | Status: DC
  Administered 2020-03-04 – 2020-03-06 (×6): 10 mL via ORAL
  Filled 2020-03-04 (×6): qty 10

## 2020-03-04 MED ORDER — LORATADINE 10 MG PO TABS
10.0000 mg | ORAL_TABLET | Freq: Every day | ORAL | Status: DC
  Administered 2020-03-04 – 2020-03-06 (×3): 10 mg via ORAL
  Filled 2020-03-04 (×2): qty 1

## 2020-03-04 MED ORDER — FLUTICASONE PROPIONATE 50 MCG/ACT NA SUSP
1.0000 | Freq: Every day | NASAL | Status: DC
  Administered 2020-03-04 – 2020-03-06 (×3): 1 via NASAL
  Filled 2020-03-04: qty 16

## 2020-03-04 MED ORDER — CALCIUM CARBONATE ANTACID 500 MG PO CHEW
1.0000 | CHEWABLE_TABLET | Freq: Once | ORAL | Status: AC | PRN
  Administered 2020-03-04: 200 mg via ORAL
  Filled 2020-03-04: qty 1

## 2020-03-04 NOTE — Progress Notes (Addendum)
  Date: 03/04/2020  Patient name: Frank [name]  Medical record number: [id number]  Date of birth: 15-Jul-1955    Subjective:   No acute overnight events. He is still complaining of congestion that is making it hard for him to breathe through his nose.   We came in right after PT worked with him. Had a coughing spell in front [place] after which he became very short of breath and tachypneic. He reports that he normally uses zyrtec for allergies at home and mucinex for congestion.  All questions and concerns were addressed.  Objective:  Vital signs in last 24 hours: Vitals:   03/03/20 2027 03/04/20 0606 03/04/20 0833 03/04/20 1330  BP: 124/67 (!) 142/87  132/78  Pulse: 79 78  84  Resp: 19 18  20   Temp: 98.9 F (37.2 C) 98.3 F (36.8 C)  97.8 F (36.6 C)  TempSrc: Oral Oral  Oral  SpO2: 94% 96% 92% 90%  Weight:      Height:       General: sitting in chair, on [place], NAD CV: normal rate and regular rhythm, no m/r/g Pulm: tachypneic, b/l rhonchi noted Abdomen: soft, nontender, nondistended, +BS Extremities: warm and well perfused, no edema Neuro: AAOx3   Significant new test results: D-dimer: 1.25 > 1.01 CRP: 2 > 1 Ferritin: 343 > 238  Assessment/Plan:  Principal Problem:   COVID-19 Active Problems:   Chronic respiratory failure with hypoxia, on home O2 therapy (HCC)   COPD (chronic obstructive pulmonary disease) ([place])  64 year old male with a history of COPD admitted for COVID-19 pneumonia, gradually improving.   Acute on chronic hypoxic respiratory failure and COPD exacerbation due to COVID-19 pneumonia Inflammatory markers continuing to trend down. Finished course of remdesivir. He is back on his home O2 requirement of 4L [place], satting >92% at rest. However, he desaturates to mid-80s and becomes short of breath with ambulation and after his coughing spells. Not yet at baseline respiratory status or oxygenation, so will continue IV dexamethasone. Will continue to assess  oxygenation with ambulation. - on day 6/10 of decadron - Supplemental oxygen as needed to maintain sats 88% to 94% - Trend inflammatory markers - Incruse and Breo daily, albuterol prn - scheduled robitussin for cough/congestion - added claritin and flonase prn for allergies/nasal congestion - added zofran prn for nausea - continue PT/OT   Prior to Admission Living Arrangement: Home Anticipated Discharge Location: Home Barriers to Discharge: continue medical management Dispo: TBD   Virl Axe, MD 03/04/2020, 3:12 PM Pager: [phone]

## 2020-03-04 NOTE — Progress Notes (Addendum)
Physical Therapy Treatment Patient Details Name: Frank [name] MRN: [id number] DOB: November 15, 1955 Today's Date: 03/04/2020    History of Present Illness 64 yo male presenting to ED with shortness of breath and non-productive cough. Tested COVID-19 positive; vaccinated. PMH including COPD with 4L home O2, Hypoxemia, Benign prostatic hyperplasia , and chronic respiratory failure.    PT Comments    Patient able to progress to ambulating on 4L with RW x 22 ft. Maintained sats >=88% with RR 38 but able to recover in standign with pursed lip breathing. Then walked back x 22 ft. Seated recovery and after one round of standing exercises, he began coughing with sats at lowest 83% and RR 44. Patient does a very good job of self-correcting his breathing to pursed lip breathing and slowing his rate. Began discussion re: being home alone and poor activity tolerance. He says his sister may come stay with him (she is vaccinated but has not had COVID). Will continue to monitor for most appropriate discharge venue.      Follow Up Recommendations  Home health PT;SNF;Supervision/Assistance - 24 hour     Equipment Recommendations  Other (comment);Wheelchair (measurements PT);Wheelchair cushion (measurements PT) (TBD as progresses (noted sister requests w/c if dc's home))    Recommendations for Other Services       Precautions / Restrictions Precautions Precautions: Fall    Mobility  Bed Mobility                  Transfers Overall transfer level: Needs assistance Equipment used: Rolling walker (2 wheeled) Transfers: Sit to/from Stand Sit to Stand: Supervision         General transfer comment: supervision for cues/lines  Ambulation/Gait Ambulation/Gait assistance: Min guard Gait Distance (Feet): 22 Feet (standing rest x 3 minutes; 22 ft) Assistive device: Rolling walker (2 wheeled) Gait Pattern/deviations: Step-through pattern;Decreased stride length;Wide base of support Gait  velocity: decr   General Gait Details: encouraged slower pace, vc for use of RW; pt reported feels more secure/steady with RW; discussed ?rollator but he does not think it will work well in his smaller home   Stairs             Wheelchair Mobility    Modified Rankin (Stroke Patients Only)       Balance Overall balance assessment: Needs assistance Sitting-balance support: No upper extremity supported;Feet supported Sitting balance-Leahy Scale: Good     Standing balance support: No upper extremity supported;During functional activity Standing balance-Leahy Scale: Fair                              Cognition Arousal/Alertness: Awake/alert Behavior During Therapy: WFL for tasks assessed/performed Overall Cognitive Status: Within Functional Limits for tasks assessed                                        Exercises Other Exercises Other Exercises: standing mini-squats with bil UE on RW x 8 reps Other Exercises: Began coughing and could not proceed with further standing ex's    General Comments General comments (skin integrity, edema, etc.): Patient on 4L with sats 92%. During gait sats 88% with RR max 38. During rest returned to 90% RR 30; After sitting in chair, began coughing with sats decreasing to 83% and RR 44bpm. Patient able to recover on 4L after 4 minutes with "tripod" position for breathing.  at rest sats 89% 4L RR 24      Pertinent Vitals/Pain Pain Assessment: No/denies pain    Home Living                      Prior Function            PT Goals (current goals can now be found in the care plan section) Acute Rehab PT Goals Patient Stated Goal: Go home Time For Goal Achievement: 03/15/20 Potential to Achieve Goals: Fair Progress towards PT goals: Progressing toward goals    Frequency    Min 3X/week      PT Plan Discharge plan needs to be updated    Co-evaluation              AM-PAC PT "6 Clicks"  Mobility   Outcome Measure  Help needed turning from your back to your side while in a flat bed without using bedrails?: None Help needed moving from lying on your back to sitting on the side of a flat bed without using bedrails?: None Help needed moving to and from a bed to a chair (including a wheelchair)?: A Little Help needed standing up from a chair using your arms (e.g., wheelchair or bedside chair)?: A Little Help needed to walk in hospital room?: A Little Help needed climbing 3-5 steps with a railing? : A Lot 6 Click Score: 19    End of Session Equipment Utilized During Treatment: Oxygen;Gait belt Activity Tolerance: Treatment limited secondary to medical complications (Comment) (incr RR and WOB during gait and coughing) Patient left: with call bell/phone within reach;in chair;with nursing/sitter in room Nurse Communication: Mobility status PT Visit Diagnosis: Difficulty in walking, not elsewhere classified (R26.2);Muscle weakness (generalized) (M62.81)     Time: 1355-1445 PT Time Calculation (min) (ACUTE ONLY): 50 min  Charges:  $Gait Training: 8-22 mins $Therapeutic Exercise: 8-22 mins $Self Care/Home Management: 8-22                      Arby Barrette, PT Pager [phone]    Rexanne Mano 03/04/2020, 6:00 PM

## 2020-03-05 LAB — C-REACTIVE PROTEIN: CRP: 0.8 mg/dL (ref <1.0)

## 2020-03-05 LAB — D-DIMER, QUANTITATIVE: D-Dimer, Quant: 0.89 ug/mL-FEU — ABNORMAL HIGH (ref 0.00–0.50)

## 2020-03-05 LAB — FERRITIN: Ferritin: 237 ng/mL (ref 24–336)

## 2020-03-05 NOTE — Progress Notes (Signed)
Occupational Therapy Treatment Patient Details Name: Frank [name] MRN: [id number] DOB: 02/06/1956 Today's Date: 03/05/2020    History of present illness 64 yo male presenting to ED with shortness of breath and non-productive cough. Tested COVID-19 positive; vaccinated. PMH including COPD with 4L home O2, Hypoxemia, Benign prostatic hyperplasia , and chronic respiratory failure.   OT comments  Pt progressing towards established OT goals. Despite fatigue, pt very motivated to participate in therapy. Pt reports he feels about the same as yesterday. Pt performing functional mobility with Supervision and RW to/from the door x4. Requiring seated rest breaks between each lap. During first trial, pt with coughing episode and SpO2 dropping to 86% on 4L. Pt recovering with seated rest break and purse lip breathing; also having pt blow his nose. Overall, SpO2 maintaining >90% on 4L O2 and RR 20-30s. Provided education and handout on energy conservation techniques for ADLs and IADLs; pt verbalized understanding. Discussed ways to implement techniques and safety tips for BADLs as well as having family provide meals. Continue to recommend dc to home with HHOT and will continue to follow acutely as admitted.  .  Follow Up Recommendations  Home health OT;Supervision/Assistance - 24 hour    Equipment Recommendations  3 in 1 bedside commode (As shower seat)    Recommendations for Other Services PT consult    Precautions / Restrictions Precautions Precautions: Fall       Mobility Bed Mobility               General bed mobility comments: Pt in recliner upon arrival  Transfers Overall transfer level: Needs assistance Equipment used: Rolling walker (2 wheeled) Transfers: Sit to/from Stand Sit to Stand: Supervision         General transfer comment: supervision for cues/lines    Balance Overall balance assessment: Needs assistance Sitting-balance support: No upper extremity  supported;Feet supported Sitting balance-Leahy Scale: Good     Standing balance support: No upper extremity supported;During functional activity Standing balance-Leahy Scale: Fair                             ADL either performed or assessed with clinical judgement   ADL Overall ADL's : Needs assistance/impaired                         Toilet Transfer: Supervision/safety;Ambulation (simulated in room) Toilet Transfer Details (indicate cue type and reason): Supervision for safety         Functional mobility during ADLs: Supervision/safety;Rolling walker General ADL Comments: Pt performing functional mobility in room to/from recliner and door; x4 with seated rest breaks. Pt SpO2 >90% on 4L. Pt with coughing episode after first walking trial and SpO2 dropping to 86%. Provided pt with education and handout on energy conservation for ADLs and IADLs. Pt verbalized ways he plans to implement into daily routine.     Vision       Perception     Praxis      Cognition Arousal/Alertness: Awake/alert Behavior During Therapy: WFL for tasks assessed/performed Overall Cognitive Status: Within Functional Limits for tasks assessed                                 General Comments: Motivated despite fatigue        Exercises Exercises: Other exercises   Shoulder Instructions       General  Comments SpO2 maintaining >90% on 4L during functional mobility. performing mobility to/from recliner and door x4 with seated rest breaks. Coughing episode during first trial and SpO2 dropping to 86% on 4L.     Pertinent Vitals/ Pain       Pain Assessment: No/denies pain  Home Living                                          Prior Functioning/Environment              Frequency  Min 3X/week        Progress Toward Goals  OT Goals(current goals can now be found in the care plan section)  Progress towards OT goals: Progressing toward  goals  Acute Rehab OT Goals Patient Stated Goal: Go home OT Goal Formulation: With patient Time For Goal Achievement: 03/15/20 Potential to Achieve Goals: Good ADL Goals Pt Will Perform Grooming: with modified independence;standing;sitting Pt Will Perform Lower Body Dressing: with modified independence;sit to/from stand Pt Will Transfer to Toilet: with modified independence;ambulating;regular height toilet Pt Will Perform Toileting - Clothing Manipulation and hygiene: with modified independence;sitting/lateral leans;sit to/from stand Pt Will Perform Tub/Shower Transfer: Tub transfer;with supervision;3 in 1;ambulating Additional ADL Goal #1: Pt will independently verbalize three energy conservation techniques for ADLs Additional ADL Goal #2: Pt will independently monitor SpO2 and use purse lip breathing for ADLs  Plan Discharge plan remains appropriate    Co-evaluation                 AM-PAC OT "6 Clicks" Daily Activity     Outcome Measure   Help from another person eating meals?: A Little Help from another person taking care of personal grooming?: A Little Help from another person toileting, which includes using toliet, bedpan, or urinal?: A Little Help from another person bathing (including washing, rinsing, drying)?: A Little Help from another person to put on and taking off regular upper body clothing?: A Little Help from another person to put on and taking off regular lower body clothing?: A Little 6 Click Score: 18    End of Session Equipment Utilized During Treatment: Oxygen;Rolling walker (4L)  OT Visit Diagnosis: Unsteadiness on feet (R26.81);Other abnormalities of gait and mobility (R26.89);Muscle weakness (generalized) (M62.81);Pain Pain - part of body:  (Buttocks)   Activity Tolerance Patient tolerated treatment well   Patient Left in chair;with call bell/phone within reach   Nurse Communication Mobility status        Time: 1325-1413 OT Time  Calculation (min): 48 min  Charges: OT Treatments $Self Care/Home Management : 8-22 mins $Therapeutic Activity: 23-37 mins  [place], OTR/L Acute Rehab Pager: [phone] Office: [place] 03/05/2020, 4:22 PM

## 2020-03-05 NOTE — Progress Notes (Addendum)
  Date: 03/05/2020  Patient name: Frank [name]  Medical record number: [id number]  Date of birth: 1955/09/19    Subjective:   Reports that he has been feeling good since yesterday aside from a 4-minute hot spell he had this morning. Reports that his coughing spells have become somewhat more infrequent now.   Discussed plan to continue working with physical therapy and moving around to see how he oxygenates with ambulation.  Objective:  Vital signs in last 24 hours: Vitals:   03/04/20 0833 03/04/20 1330 03/04/20 2020 03/05/20 0440  BP:  132/78 138/83 122/74  Pulse:  84 94 68  Resp:  20 (!) 25 17  Temp:  97.8 F (36.6 C) 99.2 F (37.3 C) 98.8 F (37.1 C)  TempSrc:  Oral Oral Axillary  SpO2: 92% 90% 92% 90%  Weight:      Height:       General: sitting in chair, on [place], NAD CV: normal rate and regular rhythm, no m/r/g Pulm: clear to auscultation bilaterally Abdomen: soft, nondistended, nontender, +BS MSK: no edema Neuro: AAOx3   Significant new test results: D-dimer: 1.01 > 0.89 CRP: 1 > 0.8 Ferritin: 238 > 237  Assessment/Plan:  Principal Problem:   COVID-19 Active Problems:   Chronic respiratory failure with hypoxia, on home O2 therapy (HCC)   COPD (chronic obstructive pulmonary disease) ([place])  64 year old male with a history of COPD admitted for COVID-19 pneumonia, gradually improving.   Acute on chronic hypoxic respiratory failure and COPD exacerbation due to COVID-19 pneumonia Inflammatory markers continuing to trend down. Finished course of remdesivir. He is back on his home O2 requirement of 4L , satting >92% at rest. However, he desaturates to mid-80s and becomes short of breath with ambulation and after his coughing spells. Not yet at baseline respiratory status or oxygenation, so will continue IV dexamethasone. Will continue to assess oxygenation with ambulation. - on day 7/10 of decadron - Supplemental oxygen as needed to maintain sats 88% to 94% -  Trend inflammatory markers - Incruse and Breo daily, albuterol prn - scheduled robitussin for cough/congestion - added claritin and flonase prn for allergies/nasal congestion - added zofran prn for nausea - continue PT/OT   Prior to Admission Living Arrangement: Home Anticipated Discharge Location: Home Barriers to Discharge: continued medical management Dispo: TBD   Virl Axe, MD 03/05/2020, 9:25 AM Pager: [phone]

## 2020-03-06 DIAGNOSIS — Z9981 Dependence on supplemental oxygen: Secondary | ICD-10-CM

## 2020-03-06 DIAGNOSIS — J9611 Chronic respiratory failure with hypoxia: Secondary | ICD-10-CM

## 2020-03-06 LAB — CREATININE, SERUM
Creatinine, Ser: 0.81 mg/dL (ref 0.61–1.24)
GFR, Estimated: >60 mL/min (ref >60)

## 2020-03-06 LAB — C-REACTIVE PROTEIN: CRP: 0.6 mg/dL (ref <1.0)

## 2020-03-06 LAB — D-DIMER, QUANTITATIVE: D-Dimer, Quant: 0.76 ug/mL-FEU — ABNORMAL HIGH (ref 0.00–0.50)

## 2020-03-06 LAB — FERRITIN: Ferritin: 207 ng/mL (ref 24–336)

## 2020-03-06 NOTE — Progress Notes (Signed)
1630: patient discharged in stable condition with no complaints, with all belongings, supplied equipment on 4LNC baseline for at home O2 demand. Transported off floor in wheelchair.

## 2020-03-06 NOTE — Plan of Care (Signed)
Patient is currently resting in bed. VSS. Remains on 4L [place] (baseline). Voiding. C/o SOB, given PRN inhaler. Call bell within reach. Bed alarm on.   Problem: Education: Goal: Knowledge of General Education information will improve Description: Including pain rating scale, medication(s)/side effects and non-pharmacologic comfort measures Outcome: Progressing   Problem: Health Behavior/Discharge Planning: Goal: Ability to manage health-related needs will improve Outcome: Progressing   Problem: Clinical Measurements: Goal: Ability to maintain clinical measurements within normal limits will improve Outcome: Progressing Goal: Will remain free from infection Outcome: Progressing Goal: Diagnostic test results will improve Outcome: Progressing Goal: Respiratory complications will improve Outcome: Progressing Goal: Cardiovascular complication will be avoided Outcome: Progressing   Problem: Activity: Goal: Risk for activity intolerance will decrease Outcome: Progressing   Problem: Nutrition: Goal: Adequate nutrition will be maintained Outcome: Progressing   Problem: Coping: Goal: Level of anxiety will decrease Outcome: Progressing   Problem: Elimination: Goal: Will not experience complications related to bowel motility Outcome: Progressing Goal: Will not experience complications related to urinary retention Outcome: Progressing   Problem: Pain Managment: Goal: General experience of comfort will improve Outcome: Progressing   Problem: Safety: Goal: Ability to remain free from injury will improve Outcome: Progressing   Problem: Skin Integrity: Goal: Risk for impaired skin integrity will decrease Outcome: Progressing

## 2020-03-06 NOTE — TOC Transition Note (Signed)
Transition of Care [facility]) - CM/SW Discharge Note   Patient Details  Name: Frank [name] MRN: [id number] Date of Birth: [birth year]  Transition of Care [facility]) CM/SW Contact:  Jon Billings, RN Phone Number: 03/06/2020, 3:05 PM   Clinical Narrative:   Spoke with sister.  She plans to transport patient home and has oxygen for patient that she will be bringing for discharge. DME of wheelchair, hospital bed, 3N1, and rolling walker completed. Advised sister on DME status.  Patient hospital bed to be delivered on Monday.  Sister advised of this and she is agreeable.     Final next level of care: [place] Barriers to Discharge: No Barriers Identified   Patient Goals and CMS Choice Patient states their goals for this hospitalization and ongoing recovery are:: "whatever is best" CMS Medicare.gov Compare Post Acute Care list provided to::  (Pt on 5W, does not have preference, agreeable to [facility].)    Discharge Placement                       Discharge Plan and Services     Post Acute Care Choice: Home Health          DME Arranged: 3-N-1, Hospital bed, Wheelchair manual, Walker rolling DME Agency: [employer] Date DME Agency Contacted: 03/06/20 Time DME Agency Contacted: 0100 Representative spoke with at DME Agency: [place] HH Arranged: PT, OT [place] Agency: [place] Date [place]: 03/06/20 Time [place]: 43 Representative spoke with at [place]: [place] ([place]) Interventions     Readmission Risk Interventions No flowsheet data found.

## 2020-03-06 NOTE — Progress Notes (Signed)
   Subjective:   No acute events overnight  Patient reports he is doing well this morning.  He feels almost back to his baseline and would like to go home.  [name] , home health. and he says he will have support from his sisters.  Denies any cough.  Objective:  Vital signs in last 24 hours: Vitals:   03/05/20 1421 03/05/20 2000 03/05/20 2139 03/06/20 0529  BP: 122/78  137/77 126/75  Pulse: 96  78 74  Resp: 20  (!) 24 (!) 23  Temp: 98 F (36.7 C)  98 F (36.7 C) 97.8 F (36.6 C)  TempSrc: Oral  Oral Oral  SpO2:  99% 99% 97%  Weight:      Height:       General: Chronically ill-appearing no acute distress Cardiovascular: Regular rate and rhythm no murmurs rubs or gallops Pulmonary: No wheezes no rhonchi , normal work of breathing Skin: Warm dry   CBC Latest Ref Rng & Units 03/04/2020 03/03/2020 03/02/2020  WBC 4.0 - 10.5 K/uL 6.5 5.9 5.5  Hemoglobin 13.0 - 17.0 g/dL 14.3 14.7 13.8  Hematocrit 39 - 52 % 45.2 46.4 43.0  Platelets 150 - 400 K/uL 234 230 204    BMP Latest Ref Rng & Units 03/06/2020 03/04/2020 03/03/2020  Glucose 70 - 99 mg/dL - 146(H) 135(H)  BUN 8 - 23 mg/dL - 25(H) 24(H)  Creatinine 0.61 - 1.24 mg/dL 0.81 0.92 0.86  BUN/Creat Ratio 10 - 24 - - -  Sodium 135 - 145 mmol/L - 137 138  Potassium 3.5 - 5.1 mmol/L - 4.5 4.4  Chloride 98 - 111 mmol/L - 97(L) 97(L)  CO2 22 - 32 mmol/L - 31 31  Calcium 8.9 - 10.3 mg/dL - 8.8(L) 9.1     Assessment/Plan:  Principal Problem:   COVID-19 Active Problems:   Chronic respiratory failure with hypoxia, on home O2 therapy (HCC)   COPD (chronic obstructive pulmonary disease) (HCC)   Frank [name] is a 64 y.o. with PMH of COPD admit for COVID-19 pneumonia on hospital day 7  Acute on chronic hypoxic respiratory failure and COPD exacerbation due to COVID-19 pneumonia Inflammatory markers continue to trend down.  Patient on 4 L submental oxygen at home and able to work with OT on 4 L  submental oxygen yesterday.  Noted continue to have coughing one episode of desaturation to 86% on 4 L.  Cough is improved this morning and we will have patient ambulate with nursing. -DME: 3 1 bedside commode, wheelchair with wheelchair cushion, walker - Finished 5-day course of remdesivir.  - He is on day 8/10 of dexamethasone -Incruse and Breo daily, albuterol as needed -Schedule Robitussin for cough and congestion -Fluticasone and Claritin for allergies/nasal congestion   Prior to Admission Living Arrangement: Home Anticipated Discharge Location: Home Barriers to Discharge: continue medical management Dispo: Anticipated discharge today pending maintaining oxygen sats >88% on 4L [place] with ambulation  Frank Snider, MD Internal Medicine PGY-2  Pager: [phone] After 5pm on weekdays and 1pm on weekends: On Call pager [phone]

## 2020-03-06 NOTE — Progress Notes (Signed)
1230: patient ambulated in room on 4LNC resulting in mild desaturation maintaining above 88%. Patient did not experience coughing episode during ambulation. Patient did not appear in distress post ambulation

## 2020-03-06 NOTE — Care Management (Cosign Needed)
    Durable Medical Equipment  (From admission, onward)         Start     Ordered   03/06/20 1420  For home use only DME lightweight manual wheelchair with seat cushion  Once       Comments: Patient suffers from COPD which impairs their ability to perform daily activities like Ambulating and ADL's in the home.  A walker will not resolve  issue with performing activities of daily living. A wheelchair will allow patient to safely perform daily activities. Patient is not able to propel themselves in the home using a standard weight wheelchair due to weakness. Patient can self propel in the lightweight wheelchair. Length of need Lifetime. Accessories: elevating leg rests (ELRs), wheel locks, extensions and anti-tippers.   03/06/20 1422   03/06/20 1417  For home use only DME Hospital bed  Once       Question Answer Comment  Length of Need Lifetime   Patient has (list medical condition): COPD   The above medical condition requires: Patient requires the ability to reposition frequently   Head must be elevated greater than: 30 degrees   Bed type Semi-electric   Support Surface: Gel Overlay      03/06/20 1422   03/06/20 1054  For home use only DME Walker rolling  Once       Question Answer Comment  Walker: With [place]   Patient needs a walker to treat with the following condition Dyspnea and respiratory abnormalities      03/06/20 1054   03/06/20 1054  For home use only DME standard manual wheelchair with seat cushion  Once       Comments: Patient suffers from COPD which impairs their ability to perform daily activities like bathing, dressing, feeding, grooming, and toileting in the home.  A walker will not resolve issue with performing activities of daily living. A wheelchair will allow patient to safely perform daily activities. Patient can safely propel the wheelchair in the home or has a caregiver who can provide assistance. Length of need Lifetime. Accessories: elevating leg rests  (ELRs), wheel locks, extensions and anti-tippers.   03/06/20 1054   03/06/20 1053  For home use only DME 3 n 1  Once        03/06/20 1054

## 2020-03-06 NOTE — TOC Initial Note (Signed)
Transition of Care [facility]) - Initial/Assessment Note    Patient Details  Name: Frank [name] MRN: [id number] Date of Birth: Jul 11, 1955  Transition of [facility]) CM/SW Contact:    Joanne Chars, LCSW Phone Number: 03/06/2020, 2:16 PM  Clinical Narrative:  CSW spoke with pt regarding discharge plan.  Pt states he lives alone, but has a sister who comes by frequently.  Pt reports he has [place] already in place but did not know the name of the agency.  Permission given to speak to sister.  [name] reports pt jsut started with [facility], which provides RN services only.  Discussed recommendation for 24 hour care and Frank [name] said she can be at the home every day but not necessarily 24 hours, she also said pt has been getting more irritable in the hospital and does not think waiting several days for SNF placement would work well.  Pt currently has no equipment in the home, sister requesting wheelchair and said she has been trying to get hospital bed as well.  CSW spoke with Dr Court Joy regarding [facility] vs SNF.  He reports plan has been [place] and is comfortable with this.  CSW spoke with pt again, discussed choice due to need to work with [facility] agency that can provide PT/OT.  He does not have preference, agreeable to [facility].                         Patient Goals and CMS Choice Patient states their goals for this hospitalization and ongoing recovery are:: "whatever is best" CMS Medicare.gov Compare Post Acute Care list provided to::  (Pt on 5W, does not have preference, agreeable to [facility].)    Expected Discharge Plan and Services       Post Acute Care Choice: [place] arrangements for the past 2 months: [place] Expected Discharge Date: 03/06/20                         HH Arranged: PT, OT HH Agency: [place] Date [place]: 03/06/20 Time HH Agency Contacted: 1410 Representative spoke with at [place]: Tommi Rumps  Prior Living  Arrangements/Services Living arrangements for the past 2 months: [place] with:: Self Patient language and need for interpreter reviewed:: Yes Do you feel safe going back to the place where you live?: Yes      Need for Family Participation in Patient Care: Yes (Comment) Care giver support system in place?: Yes (comment) Current home services: Other (comment) ([place], RN only) Criminal Activity/Legal Involvement Pertinent to Current Situation/Hospitalization: No - Comment as needed  Activities of Daily Living Home Assistive Devices/Equipment: Oxygen ADL Screening (condition at time of admission) Patient's cognitive ability adequate to safely complete daily activities?: Yes Is the patient deaf or have difficulty hearing?: No Does the patient have difficulty seeing, even when wearing glasses/contacts?: No Does the patient have difficulty concentrating, remembering, or making decisions?: No Patient able to express need for assistance with ADLs?: Yes Does the patient have difficulty dressing or bathing?: No Independently performs ADLs?: Yes (appropriate for developmental age) Does the patient have difficulty walking or climbing stairs?: No Weakness of Legs: None Weakness of Arms/Hands: None  Permission Sought/Granted Permission sought to share information with : Facility Sport and exercise psychologist, Family Supports Permission granted to share information with : Yes, Verbal Permission Granted  Share Information with NAME: [name]  Permission granted to share info w AGENCY: HH        Emotional Assessment Appearance:: Other (Comment Required (covid pt, assessment done over the phone) Attitude/Demeanor/Rapport: Engaged Affect (typically observed): Pleasant Orientation: : Oriented to Self, Oriented to Place, Oriented to  Time, Oriented to Situation Alcohol / Substance Use: Not Applicable Psych Involvement: No (comment)  Admission diagnosis:   COVID-19 virus infection [U07.1] COVID-19 [U07.1] Patient Active Problem List   Diagnosis Date Noted  . COVID-19 02/28/2020  . Physical deconditioning 08/29/2019  . Decreased appetite 07/08/2019  . Healthcare maintenance 07/08/2019  . Allergic rhinitis 05/30/2017  . COPD (chronic obstructive pulmonary disease) ([place]) 11/27/2016  . Advance care planning 10/23/2016  . Diverticulosis of colon 06/09/2016  . History of colonic polyps 06/17/2014  . Benign prostatic hyperplasia (BPH) with straining on urination 06/12/2014  . Chronic respiratory failure with hypoxia, on home O2 therapy ([place]) 06/05/2012   PCP:  Iona Beard, MD Pharmacy:   [facility] [place], [address] [address] Phone: [phone] Fax: [phone]     Social Determinants of Health (SDOH) Interventions    Readmission Risk Interventions No flowsheet data found.

## 2020-03-06 NOTE — Discharge Summary (Signed)
Name: Frank [name] MRN: [id number] DOB: May 04, 1956 64 y.o. PCP: Iona Beard, MD  Date of Admission: 02/28/2020 11:44 AM Date of Discharge: 03/06/2020 Attending Physician: Gilles Chiquito B  Discharge Diagnosis: 1.  Acute on chronic hypoxic respiratory failure  2.  COPD exacerbation due to COVID-19 pneumonia  Discharge Medications: Allergies as of 03/06/2020   No Known Allergies     Medication List    STOP taking these medications   doxycycline 100 MG tablet Commonly known as: VIBRA-TABS   predniSONE 10 MG tablet Commonly known as: DELTASONE     TAKE these medications   albuterol 0.63 MG/3ML nebulizer solution Commonly known as: ACCUNEB USE 1 VIAL IN NEBULIZER EVERY 6 HOURS What changed: See the new instructions.   albuterol 108 (90 Base) MCG/ACT inhaler Commonly known as: VENTOLIN HFA INHALE 2 PUFFS BY MOUTH EVERY 4 HOURS AS NEEDED FOR WHEEZING OR SHORTNESS OF BREATH What changed:   how much to take  how to take this  when to take this  reasons to take this  additional instructions   fluticasone 50 MCG/ACT nasal spray Commonly known as: FLONASE Place 1 spray into both nostrils daily. What changed:   when to take this  reasons to take this   OXYGEN Inhale 4 L/min into the lungs continuous.   Trelegy Ellipta 100-62.5-25 MCG/INH Aepb Generic drug: Fluticasone-Umeclidin-Vilant Inhale 1 puff into the lungs daily.       Disposition and follow-up:   Frank [name] was discharged from [facility] in Stable condition.  At the hospital follow up visit please address:  1.  Acute on chronic hypoxic respiratory failure and current exacerbation due to COVID-19 pneumonia: Patient received 5-day course of remdesivir, 8 days of dexamethasone.  He gradually improved daily and was able to maintain sats while ambulating on home 4 L before discharge.  Check for any post COVID-19 symptoms and recommend patient following up with his  pulmonologist.  2.  Labs / imaging needed at time of follow-up: n/a  3.  Pending labs/ test needing follow-up: n/a  Follow-up Appointments:  Follow-up Information    Iona Beard, MD Follow up.   Specialty: Internal Medicine Contact information: [place] [address] [phone]        [facility] Follow up in 3 week(s).   Specialty: Pulmonology Why: Call and schedule a follow-up appointment with your pulmonologist after your quarantine. Contact information: [address] [place] [address] [phone]       Care, [facility] Follow up.   Specialty: [place] Why: Alvis Lemmings will contact you within 24-48 hours to schedule your first appointment.  Contact information: [address] [address] [place] [address] [phone]               Hospital Course by problem list: 1.  Acute on chronic hypoxic respiratory failure COPD exacerbation due to COVID-19 pneumonia: Patient presented with shortness of breath and cough.  He was started on treatment for COPD exacerbation 2 days prior to admission by his pulmonologist, but given he had no improvement he came to the ED.  He was found to be COVID-19 positive.  He is on 4 L supplemental oxygen chronically for COPD and not requiring additional oxygen.  However he desatted with any movement.  Patient received 5 days of treatment with remdesivir and 8 days of Decadron.  Satting well at discharge on 4 L submental oxygen. Recommend patient have  a follow-up visit with his PCP in 1-2 weeks and also with his pulmonologist in 3-4 weeks.  Discharge Vitals:   BP 129/75 (BP Location: Left Arm)   Pulse 91   Temp 98.7 F (37.1 C) (Axillary)   Resp 18   Ht 5\' 6"  (1.676 m)   Wt 74.8 kg   SpO2 96%   BMI 26.63 kg/m   Pertinent Labs, Studies, and Procedures:  CBC Latest Ref Rng & Units 03/04/2020 03/03/2020 03/02/2020  WBC 4.0 - 10.5 K/uL 6.5 5.9 5.5  Hemoglobin  13.0 - 17.0 g/dL 14.3 14.7 13.8  Hematocrit 39 - 52 % 45.2 46.4 43.0  Platelets 150 - 400 K/uL 234 230 204   CMP Latest Ref Rng & Units 03/06/2020 03/04/2020 03/03/2020  Glucose 70 - 99 mg/dL - 146(H) 135(H)  BUN 8 - 23 mg/dL - 25(H) 24(H)  Creatinine 0.61 - 1.24 mg/dL 0.81 0.92 0.86  Sodium 135 - 145 mmol/L - 137 138  Potassium 3.5 - 5.1 mmol/L - 4.5 4.4  Chloride 98 - 111 mmol/L - 97(L) 97(L)  CO2 22 - 32 mmol/L - 31 31  Calcium 8.9 - 10.3 mg/dL - 8.8(L) 9.1  Total Protein 6.5 - 8.1 g/dL - 6.6 6.9  Total Bilirubin 0.3 - 1.2 mg/dL - 0.6 0.7  Alkaline Phos 38 - 126 U/L - 59 60  AST 15 - 41 U/L - 55(H) 113(H)  ALT 0 - 44 U/L - 81(H) 99(H)   DG Chest Port 1 View  Result Date: 02/28/2020 CLINICAL DATA:  Shortness of breath. EXAM: PORTABLE CHEST 1 VIEW COMPARISON:  Oct 05, 2019. FINDINGS: The heart size and mediastinal contours are within normal limits. No pneumothorax or pleural effusion is noted. Emphysematous disease is noted. Stable right basilar interstitial densities are noted concerning for scarring or atelectasis. The visualized skeletal structures are unremarkable. IMPRESSION: Stable right basilar interstitial densities are noted concerning for scarring or atelectasis. Emphysematous disease is noted. Emphysema (ICD10-J43.9). Electronically Signed   By: Marijo Conception M.D.   On: 02/28/2020 12:40    Discharge Instructions: Discharge Instructions    Diet - low sodium heart healthy   Complete by: As directed    Increase activity slowly   Complete by: As directed       Signed: Madalyn Rob, MD 03/08/2020, 9:14 AM

## 2020-03-06 NOTE — Discharge Instructions (Signed)
  Frank [name], We are glad you are feeling better.  You tested positive on 10/23 and we recommend quarantine for 21 days from this date.  You have home OT and PT to help you regain your strength.  I have put in orders for wheelchair, three in one, and walker. You will need to follow-up with your pulmonologist after your quarantine.  In addition I recommend follow-up in 1 to 2 weeks with your primary care via telephone or in person.  10 Things You Can Do to Manage Your COVID-19 Symptoms at Home If you have possible or confirmed COVID-19: 1. Stay home from work and school. And stay away from other public places. If you must go out, avoid using any kind of public transportation, ridesharing, or taxis. 2. Monitor your symptoms carefully. If your symptoms get worse, call your healthcare provider immediately. 3. Get rest and stay hydrated. 4. If you have a medical appointment, call the healthcare provider ahead of time and tell them that you have or may have COVID-19. 5. For medical emergencies, call 911 and notify the dispatch personnel that you have or may have COVID-19. 6. Cover your cough and sneezes with a tissue or use the inside of your elbow. 7. Wash your hands often with soap and water for at least 20 seconds or clean your hands with an alcohol-based hand sanitizer that contains at least 60% alcohol. 8. As much as possible, stay in a specific room and away from other people in your home. Also, you should use a separate bathroom, if available. If you need to be around other people in or outside of the home, wear a mask. 9. Avoid sharing personal items with other people in your household, like dishes, towels, and bedding. 10. Clean all surfaces that are touched often, like counters, tabletops, and doorknobs. Use household cleaning sprays or wipes according to the label instructions. [email or web address] 11/06/2018 This information is not intended to replace advice given to you by your health care  provider. Make sure you discuss any questions you have with your health care provider. Document Revised: 04/10/2019 Document Reviewed: 04/10/2019 Elsevier Patient Education  [place].

## 2020-03-08 ENCOUNTER — Telehealth: Payer: Self-pay | Admitting: Student

## 2020-03-08 ENCOUNTER — Telehealth: Payer: Self-pay | Admitting: Emergency Medicine

## 2020-03-08 DIAGNOSIS — Z9981 Dependence on supplemental oxygen: Secondary | ICD-10-CM

## 2020-03-08 DIAGNOSIS — J9611 Chronic respiratory failure with hypoxia: Secondary | ICD-10-CM

## 2020-03-08 DIAGNOSIS — J449 Chronic obstructive pulmonary disease, unspecified: Secondary | ICD-10-CM | POA: Diagnosis not present

## 2020-03-08 DIAGNOSIS — U071 COVID-19: Secondary | ICD-10-CM | POA: Diagnosis not present

## 2020-03-08 NOTE — Telephone Encounter (Signed)
Frank [name] from [facility] calling with [facility] fax number is [phone]. Frank [name] phone number is [phone].

## 2020-03-08 NOTE — Telephone Encounter (Signed)
TOC-HFU APPT [facility] FOR 03/19/2020 WITH DR Court Joy.

## 2020-03-08 NOTE — Telephone Encounter (Signed)
LMTCB for the pt 

## 2020-03-09 DIAGNOSIS — J449 Chronic obstructive pulmonary disease, unspecified: Secondary | ICD-10-CM | POA: Diagnosis not present

## 2020-03-09 NOTE — Telephone Encounter (Signed)
Transition Care Management Follow-up Telephone Call   Date discharged? Oct 30   How have you been since you were released from the hospital? "alright, im a little tired, more than usual, but im ok   Do you understand why you were in the hospital? Yes, I had COVID   Do you understand the discharge instructions? Yes maam, I did, I dont have any problems   Where were you discharged to? home   Items Reviewed:  Medications reviewed: yes, states he has finished taking them "im on my regular medicine now  Allergies reviewed: "I dont know of anything im allergic to  Dietary changes reviewed: "im on my usual food at home, my sister helps me with my food  Referrals reviewed: yes   Functional Questionnaire:   Activities of Daily Living (ADLs):   He states they are independent in the following:" I try to do things for myself, I could use some help but my sister does as much as she can" States they require assistance with the following: it would be good if someone could help me a little getting a bath and dressed right now and maybe give my sister some rest"   Any transportation issues/concerns?: "no, my sister gets me where I need to go"   Any patient concerns? "no"   Confirmed importance and date/time of follow-up visits scheduled he stated he did not know when appt will be, RN informed him it would be a telephone appt with dr Court Joy on 11/12 between 10 and 1100, he was agreeable and stated he wrote it down  Confirmed with patient if condition begins to worsen call PCP or go to the ER.  Patient was given the office number and encouraged to call back with question or concerns.  : yes, went over office ph# and main hospital # for after hours questions, ask for int med resident on call, pt repeated back to nurse

## 2020-03-11 NOTE — Progress Notes (Addendum)
03/11/20 CSW informed that pt sister [name] called asking about [facility] services with [place].  Pt has not heard from them since DC on 10/30.  CSW spoke with Tommi Rumps from [place], he does have referral, not sure what happened, but he reached out to family and plans to start tomorrow. CSW attempted to call sister back at [phone].  No identification on voicemail, no message left.   Sister called back and CSW conveyed info to her and provided [facility] contact number.  Lurline Idol, MSW, LCSW 11/4/20212:09 PM

## 2020-03-12 DIAGNOSIS — J9621 Acute and chronic respiratory failure with hypoxia: Secondary | ICD-10-CM | POA: Diagnosis not present

## 2020-03-12 DIAGNOSIS — J440 Chronic obstructive pulmonary disease with acute lower respiratory infection: Secondary | ICD-10-CM | POA: Diagnosis not present

## 2020-03-12 DIAGNOSIS — N400 Enlarged prostate without lower urinary tract symptoms: Secondary | ICD-10-CM | POA: Diagnosis not present

## 2020-03-12 DIAGNOSIS — J441 Chronic obstructive pulmonary disease with (acute) exacerbation: Secondary | ICD-10-CM | POA: Diagnosis not present

## 2020-03-12 DIAGNOSIS — Z7951 Long term (current) use of inhaled steroids: Secondary | ICD-10-CM | POA: Diagnosis not present

## 2020-03-12 DIAGNOSIS — Z9981 Dependence on supplemental oxygen: Secondary | ICD-10-CM | POA: Diagnosis not present

## 2020-03-12 DIAGNOSIS — J1282 Pneumonia due to coronavirus disease 2019: Secondary | ICD-10-CM | POA: Diagnosis not present

## 2020-03-12 DIAGNOSIS — Z87891 Personal history of nicotine dependence: Secondary | ICD-10-CM | POA: Diagnosis not present

## 2020-03-12 DIAGNOSIS — U071 COVID-19: Secondary | ICD-10-CM | POA: Diagnosis not present

## 2020-03-12 DIAGNOSIS — Z9181 History of falling: Secondary | ICD-10-CM | POA: Diagnosis not present

## 2020-03-12 DIAGNOSIS — I10 Essential (primary) hypertension: Secondary | ICD-10-CM | POA: Diagnosis not present

## 2020-03-12 NOTE — Telephone Encounter (Signed)
Spoke to patient, who is requesting water bottle for oxygen concentrator. dme is [employer].  [name], please advise. Thanks.

## 2020-03-15 NOTE — Telephone Encounter (Signed)
Attempted to call pt but unable to reach. Left message for him to return call. °

## 2020-03-15 NOTE — Telephone Encounter (Signed)
Hi concentrator should already have humidity, but ok with me to send order to [employer] to have them check it, get him humidity if he does not already have

## 2020-03-16 NOTE — Telephone Encounter (Signed)
Pt returning call.  [phone]

## 2020-03-16 NOTE — Telephone Encounter (Signed)
Called and spoke with pt stating to him the info per RB. Pt said that he does not have humidity attached with his O2. Stated to pt that we would get this taken care of for him and he verbalized understanding. Order has been placed. Nothing further needed.

## 2020-03-17 ENCOUNTER — Telehealth: Payer: Self-pay | Admitting: Emergency Medicine

## 2020-03-17 NOTE — Telephone Encounter (Signed)
Lm for Jim.

## 2020-03-18 NOTE — Telephone Encounter (Signed)
I will ccc Dr Lamonte Sakai that this patient was admitted for active Covid infection 4 days after his visit with Dr Lamonte Sakai.  Frank [name] has a medical clinic appointment tomorrow with his resident doctor- Dr Court Joy- and should keep that.   Suggest f/u appointment here with an APP in 1-2 weeks. At that time they can decide when to return to see Dr Lamonte Sakai, based on his last note.

## 2020-03-18 NOTE — Telephone Encounter (Signed)
Spoke with Frank [name] and notified of Dr Janee Morn response  He verbalized understanding Nothing further needed

## 2020-03-18 NOTE — Telephone Encounter (Signed)
Spoke with Clair Gulling at [facility], states pt was hospitalized for Covid on 10/23, was discharged 10/30.  Since coming home pt has been c/o nonprod cough, sob, intermittent low grade fever that has not been present X2-3 days, fatigue.   Denies sinus congestion, mucus production, sharp chest pains.    Pt was discharged on 4lpm 24/7.  Pt also using his albuterol inhaler daily, albuterol neb daily, and Trelegy daily.  Clair Gulling is recommending additional recs on pt's behalf.    Pt is not scheduled for a follow-up in our office at this time.    Sending message to DOD since [facility] is unavailable today.  Dr. Annamaria Boots please advise on recs.  Thanks!

## 2020-03-18 NOTE — Telephone Encounter (Signed)
Attempted to call Clair Gulling to discuss info stated by Dr. Annamaria Boots but unable to reach. Left message for him to return call.  Called and spoke with pt about the call received from [facility] and also stated to him the info from Dr. Annamaria Boots. Pt has been scheduled for an appt with Derl Barrow, NP Wed. 11/24 at 12pm.  Will leave encounter open while we wait for Jim with [facility]mings to return call.

## 2020-03-19 ENCOUNTER — Other Ambulatory Visit: Payer: Self-pay

## 2020-03-19 ENCOUNTER — Encounter: Payer: Self-pay | Admitting: Internal Medicine

## 2020-03-19 ENCOUNTER — Ambulatory Visit (INDEPENDENT_AMBULATORY_CARE_PROVIDER_SITE_OTHER): Payer: Medicare Other | Admitting: Internal Medicine

## 2020-03-19 DIAGNOSIS — U071 COVID-19: Secondary | ICD-10-CM

## 2020-03-19 NOTE — Progress Notes (Signed)
  [facility] Internal Medicine Residency Telephone Encounter Continuity Care Appointment  HPI:   This telephone encounter was created for [name] on 03/19/2020 for the following purpose/cc .   Past Medical History:  Past Medical History:  Diagnosis Date  . COPD (chronic obstructive pulmonary disease) ([place]) 06/05/2012  . Dyspnea   . Dysrhythmia   . Emphysema 06/07/2012  . Heart murmur    hx of as baby  . History of kidney stones   . Hypoxemia 06/07/2012  . Obstructive chronic bronchitis with exacerbation ([place]) 06/07/2012  . Pneumonia   . Tobacco abuse 06/05/2012      ROS:  Review of Systems  Constitutional: Negative for chills and fever.  Respiratory: Positive for cough. Negative for sputum production, shortness of breath (at baseline) and wheezing.   Gastrointestinal: Negative for nausea.       Lost of taste  Neurological: Positive for weakness. Negative for dizziness and focal weakness.     Assessment / Plan / Recommendations:   Please see A&P under problem oriented charting for assessment of the patient's acute and chronic medical conditions.   As always, pt is advised that if symptoms worsen or new symptoms arise, they should go to an urgent care facility or to to ER for further evaluation.   Consent and Medical Decision Making:   Patient discussed with Dr. Jimmye Norman  This is a telephone encounter between Frank [name] and Lorene Dy on 03/19/2020 for hospital follow up after Covid-19 infection. The visit was conducted with the patient located at home and Lorene Dy at [facility]. The patient's identity was confirmed using their DOB and current address. The patient has consented to being evaluated through a telephone encounter and understands the associated risks (an examination cannot be done and the patient may need to come in for an appointment) / benefits (allows the patient to remain at home, decreasing exposure to coronavirus). I personally spent 21 minutes  on medical discussion.

## 2020-03-19 NOTE — Assessment & Plan Note (Signed)
Patient was dmitted on 10/23 for acute on chronic hypoxic respiratory failure with COPD exacerbation due to COVID-19 pneumonia.  Patient received 5 days of treatment with remdesivir and 8 days of Decadron.  He was discharged 10/30 on his chronic 4 L supplemental oxygen.  Since discharge he says he had 1 elevated temperature to 99, but normal temperature for 6 days now.  He is able to ambulate around his home on 4 L supplemental oxygen and has home PT.  He continues to have loss of taste and I reassured it should return with time.  He is using his trilogy inhaler daily .  He has had a periodic cough without sputum production.  Given precautions to let his nurse know if he has a productive cough or increasing dyspnea. He is recommended to quarantine 21 days from 10/23.  He has follow-up with NP Volanda Napoleon with [facility] on 11/24 in person.

## 2020-03-26 NOTE — Progress Notes (Signed)
Internal Medicine Clinic Attending  Case discussed with Dr. Court Joy  At the time of the visit.  We reviewed the patient's history and I agree with the assessment, diagnosis, and plan of care documented in the resident's note.

## 2020-03-30 ENCOUNTER — Telehealth: Payer: Self-pay

## 2020-03-30 NOTE — Telephone Encounter (Signed)
Pt had fall no injuries; Clair Gulling wanted to make dr aware; pls contact if you have any questions  [phone]

## 2020-03-30 NOTE — Telephone Encounter (Signed)
Seen, I am glad he didn't have any injuries. If patient has more falls or needs evaluation for fall he can be scheduled for appointment.

## 2020-03-31 ENCOUNTER — Ambulatory Visit (INDEPENDENT_AMBULATORY_CARE_PROVIDER_SITE_OTHER): Payer: Medicare Other

## 2020-03-31 ENCOUNTER — Other Ambulatory Visit: Payer: Self-pay

## 2020-03-31 ENCOUNTER — Encounter: Payer: Self-pay | Admitting: Primary Care

## 2020-03-31 ENCOUNTER — Ambulatory Visit: Payer: Medicare Other | Admitting: Primary Care

## 2020-03-31 VITALS — BP 122/64 | HR 99 | Ht 66.0 in | Wt 150.0 lb

## 2020-03-31 DIAGNOSIS — J439 Emphysema, unspecified: Secondary | ICD-10-CM | POA: Diagnosis not present

## 2020-03-31 DIAGNOSIS — J449 Chronic obstructive pulmonary disease, unspecified: Secondary | ICD-10-CM | POA: Diagnosis not present

## 2020-03-31 DIAGNOSIS — U071 COVID-19: Secondary | ICD-10-CM | POA: Diagnosis not present

## 2020-03-31 DIAGNOSIS — Z9981 Dependence on supplemental oxygen: Secondary | ICD-10-CM | POA: Diagnosis not present

## 2020-03-31 DIAGNOSIS — J9611 Chronic respiratory failure with hypoxia: Secondary | ICD-10-CM

## 2020-03-31 DIAGNOSIS — J189 Pneumonia, unspecified organism: Secondary | ICD-10-CM | POA: Diagnosis not present

## 2020-03-31 NOTE — Assessment & Plan Note (Addendum)
-   Continue 4L oxygen to maintain O2 >88-90%

## 2020-03-31 NOTE — Patient Instructions (Addendum)
Recommendations: Continue Trelegy one puff daily in the morning (rinse mouth after use) Use Incentive spirometer FOUR times a day  Take Delsym cough syrup every 12 hour for cough Take boost or ensure shakes 1-2 times a day to keep get enough calories and protein Continue physical therapy twice a week  I will check on covid 19 booster timing and call you   Follow-up 4-6 weeks with [name]

## 2020-03-31 NOTE — Assessment & Plan Note (Signed)
-   Breathing is back to baseline, he has dry cough and nasal congestion  - Continue Trelegy 100 one puff daily

## 2020-03-31 NOTE — Progress Notes (Signed)
@Patient  ID: Frank [name], male    DOB: 12/24/1955, 64 y.o.   MRN: [id number]  Chief Complaint  Patient presents with  . Hospitalization Follow-up    Pt states he is slowly getting better since hospital stay. Still becomes SOB easily.    Referring provider: Iona Beard, MD  HPI: 64 year old male, former smoker quit in 2017 (17-1/2-pack-year history).  Significant for severe COPD, chronic story failure with hypoxia, allergic rhinitis. Patient of [name], last seen in office on 02/24/2020. Maintained on Trelegy. He has been tried on chronic prednisone in the past but stopped d/t possible deleterious, may consider restating depending on his breathing. Started conversations about palliative care. Hospitalized from 02/28/20-03/06/20 for COPD exacerbation d/t covid-19 pneumonia, acute and chronic hypoxic respiratory failure.  03/31/2020- Interim hx Patient presents today for hospital follow-up following COPD exacerbation due to COVID-19 pneumonia. He was admitted to the hospital from 02/28/20 -03/06/20 for COPD exacerbation d/t covid-19. He was vaccinated for Covid, series completed in March 2021. He did not require additional oxygen, desaturates with ambulation. Patient received 5 days of treatment with remdesivir and 8 days of Decadron.   In terms of breathing, his wife feels that he is back to his baseline. He has a dry cough, no mucus production. He has chronic nasal congestion, using flonase daily. He is compliant with Trelegy. He uses albuterol hfa as needed 0-2 times a day. He continues to use 4L oxygen. He still does not have much appetite, he is working with physical therapist twice a week. Notices leg weakness when he over does it.    No Known Allergies  Immunization History  Administered Date(s) Administered  . Fluad Quad(high Dose 65+) 02/24/2020  . Influenza Inj Mdck Quad Pf 02/07/2017  . Influenza Split 06/07/2012, 02/05/2017  . Influenza, High Dose Seasonal PF 03/18/2019   . Influenza, Quadrivalent, Recombinant, Inj, Pf 02/05/2018  . Influenza,inj,Quad PF,6+ Mos 01/29/2013, 02/04/2016, 03/07/2016  . Influenza-Unspecified 01/19/2015, 02/07/2017  . PFIZER SARS-COV-2 Vaccination 08/03/2019, 08/24/2019  . Pneumococcal Conjugate-13 11/13/2013  . Pneumococcal Polysaccharide-23 06/07/2012, 07/08/2019  . Tdap 06/18/2012    Past Medical History:  Diagnosis Date  . COPD (chronic obstructive pulmonary disease) ([place]) 06/05/2012  . Dyspnea   . Dysrhythmia   . Emphysema 06/07/2012  . Heart murmur    hx of as baby  . History of kidney stones   . Hypoxemia 06/07/2012  . Obstructive chronic bronchitis with exacerbation ([place]) 06/07/2012  . Pneumonia   . Tobacco abuse 06/05/2012    Tobacco History: Social History   Tobacco Use  Smoking Status Former Smoker  . Packs/day: 0.50  . Years: 35.00  . Pack years: 17.50  . Types: Cigarettes  . Quit date: 05/11/2015  . Years since quitting: 4.8  Smokeless Tobacco Former Systems developer  . Types: Chew  . Quit date: 05/08/1968   Counseling given: Not Answered   Outpatient Medications Prior to Visit  Medication Sig Dispense Refill  . albuterol (ACCUNEB) 0.63 MG/3ML nebulizer solution USE 1 VIAL IN NEBULIZER EVERY 6 HOURS (Patient taking differently: Take 1 ampule by nebulization every 6 (six) hours. ) 360 mL 5  . albuterol (VENTOLIN HFA) 108 (90 Base) MCG/ACT inhaler INHALE 2 PUFFS BY MOUTH EVERY 4 HOURS AS NEEDED FOR WHEEZING OR SHORTNESS OF BREATH (Patient taking differently: Inhale 2 puffs into the lungs every 4 (four) hours as needed for wheezing or shortness of breath. ) 18 g 5  . fluticasone (FLONASE) 50 MCG/ACT nasal spray Place 1 spray into both  nostrils daily. (Patient taking differently: Place 1 spray into both nostrils daily as needed for allergies or rhinitis. ) 16 g 3  . Fluticasone-Umeclidin-Vilant (TRELEGY ELLIPTA) 100-62.5-25 MCG/INH AEPB Inhale 1 puff into the lungs daily. 28 each 6  . OXYGEN Inhale 4 L/min into the  lungs continuous.      No facility-administered medications prior to visit.   Review of Systems  Review of Systems  Constitutional: Negative.   HENT: Positive for postnasal drip.   Respiratory: Positive for cough. Negative for chest tightness, shortness of breath and wheezing.        DOE  Cardiovascular: Negative.   Neurological: Positive for weakness.   Physical Exam  BP 122/64 (BP Location: Left Arm, Cuff Size: Normal)   Pulse 99   Ht 5\' 6"  (1.676 m)   Wt 150 lb (68 kg)   SpO2 94%   BMI 24.21 kg/m  Physical Exam Constitutional:      Appearance: Normal appearance.  HENT:     Head: Normocephalic and atraumatic.     Mouth/Throat:     Comments: Differed d/t masking Cardiovascular:     Rate and Rhythm: Normal rate and regular rhythm.  Pulmonary:     Effort: Pulmonary effort is normal.     Breath sounds: No wheezing or rhonchi.     Comments: Scant rales  Musculoskeletal:     Cervical back: Normal range of motion and neck supple.     Comments: Amb with rolling walker  Neurological:     General: No focal deficit present.     Mental Status: He is alert and oriented to person, place, and time. Mental status is at baseline.  Psychiatric:        Mood and Affect: Mood normal.        Behavior: Behavior normal.        Thought Content: Thought content normal.        Judgment: Judgment normal.      Lab Results:  CBC    Component Value Date/Time   WBC 6.5 03/04/2020 0048   RBC 5.09 03/04/2020 0048   HGB 14.3 03/04/2020 0048   HGB 15.0 07/08/2019 1418   HCT 45.2 03/04/2020 0048   HCT 45.0 07/08/2019 1418   PLT 234 03/04/2020 0048   PLT 285 07/08/2019 1418   MCV 88.8 03/04/2020 0048   MCV 87 07/08/2019 1418   MCH 28.1 03/04/2020 0048   MCHC 31.6 03/04/2020 0048   RDW 11.9 03/04/2020 0048   RDW 12.9 07/08/2019 1418   LYMPHSABS 0.8 03/04/2020 0048   MONOABS 0.2 03/04/2020 0048   EOSABS 0.0 03/04/2020 0048   BASOSABS 0.0 03/04/2020 0048    BMET    Component  Value Date/Time   NA 137 03/04/2020 0048   NA 139 07/08/2019 1418   K 4.5 03/04/2020 0048   CL 97 (L) 03/04/2020 0048   CO2 31 03/04/2020 0048   GLUCOSE 146 (H) 03/04/2020 0048   BUN 25 (H) 03/04/2020 0048   BUN 13 07/08/2019 1418   CREATININE 0.81 03/06/2020 0237   CALCIUM 8.8 (L) 03/04/2020 0048   GFRNONAA >60 03/06/2020 0237   GFRAA >60 10/05/2019 0950    BNP    Component Value Date/Time   BNP 51.1 02/28/2020 1148    ProBNP    Component Value Date/Time   PROBNP 47.4 02/20/2014 1505    Imaging: No results found.   Assessment & Plan:   TOIZT-24 - Admitted 02/28/20 -03/06/20 for COPD exacerbation d/t covid-19. He was  fully vaccinated, completed series in March 2021. Received 5 days of treatment with remdesivir and 8 days of Decadron. He is doing very well overall considering underlying severe COPD and chronic respiratory failure. His respiratory status is back to baseline, he had no additional oxygen requirements. Appetite is decreased, recommend boost or ensure shakes 1-2 times a day. He is ambulating with walker and continues working with physical therapy twice a week. Getting repeat CXR today. FU in 4-6 weeks.   COPD (chronic obstructive pulmonary disease) ([place]) - Breathing is back to baseline, he has dry cough and nasal congestion  - Continue Trelegy 100 one puff daily  Chronic respiratory failure with hypoxia, on home O2 therapy ([place]) - Continue 4L oxygen to maintain O2 >88-90%   Martyn Ehrich, NP 03/31/2020

## 2020-03-31 NOTE — Assessment & Plan Note (Addendum)
-   Admitted 02/28/20 -03/06/20 for COPD exacerbation d/t covid-19. He was fully vaccinated, completed series in March 2021. Received 5 days of treatment with remdesivir and 8 days of Decadron. He is doing very well overall considering underlying severe COPD and chronic respiratory failure. His respiratory status is back to baseline, he had no additional oxygen requirements. Appetite is decreased, recommend boost or ensure shakes 1-2 times a day. He is ambulating with walker and continues working with physical therapy twice a week. Getting repeat CXR today. FU in 4-6 weeks.

## 2020-04-06 DIAGNOSIS — U071 COVID-19: Secondary | ICD-10-CM | POA: Diagnosis not present

## 2020-04-06 DIAGNOSIS — J449 Chronic obstructive pulmonary disease, unspecified: Secondary | ICD-10-CM | POA: Diagnosis not present

## 2020-04-07 DIAGNOSIS — U071 COVID-19: Secondary | ICD-10-CM | POA: Diagnosis not present

## 2020-04-07 DIAGNOSIS — J449 Chronic obstructive pulmonary disease, unspecified: Secondary | ICD-10-CM | POA: Diagnosis not present

## 2020-04-14 ENCOUNTER — Telehealth: Payer: Self-pay | Admitting: *Deleted

## 2020-04-14 DIAGNOSIS — N400 Enlarged prostate without lower urinary tract symptoms: Secondary | ICD-10-CM | POA: Diagnosis not present

## 2020-04-14 DIAGNOSIS — J441 Chronic obstructive pulmonary disease with (acute) exacerbation: Secondary | ICD-10-CM | POA: Diagnosis not present

## 2020-04-14 DIAGNOSIS — J9621 Acute and chronic respiratory failure with hypoxia: Secondary | ICD-10-CM | POA: Diagnosis not present

## 2020-04-14 DIAGNOSIS — J440 Chronic obstructive pulmonary disease with acute lower respiratory infection: Secondary | ICD-10-CM | POA: Diagnosis not present

## 2020-04-14 DIAGNOSIS — J1282 Pneumonia due to coronavirus disease 2019: Secondary | ICD-10-CM | POA: Diagnosis not present

## 2020-04-14 DIAGNOSIS — Z9181 History of falling: Secondary | ICD-10-CM | POA: Diagnosis not present

## 2020-04-14 DIAGNOSIS — Z7951 Long term (current) use of inhaled steroids: Secondary | ICD-10-CM | POA: Diagnosis not present

## 2020-04-14 DIAGNOSIS — Z9981 Dependence on supplemental oxygen: Secondary | ICD-10-CM | POA: Diagnosis not present

## 2020-04-14 DIAGNOSIS — Z87891 Personal history of nicotine dependence: Secondary | ICD-10-CM | POA: Diagnosis not present

## 2020-04-14 DIAGNOSIS — I10 Essential (primary) hypertension: Secondary | ICD-10-CM | POA: Diagnosis not present

## 2020-04-14 DIAGNOSIS — U071 COVID-19: Secondary | ICD-10-CM | POA: Diagnosis not present

## 2020-04-14 NOTE — Telephone Encounter (Signed)
I agree, try some lotion. If not improved, he can come into the office for evaluation.

## 2020-04-14 NOTE — Telephone Encounter (Signed)
Frank [name], PT with [facility] called to report patient has scrapes on both arms where he has been scratching himself 2/2 dry skin. Patient scratches other places but hasn't broken the skin. Patient uses unscented [employer]. He has not tried any lotions. What would you advise patient to do? L. Dezhane Staten, BSN, RN-BC

## 2020-04-16 NOTE — Telephone Encounter (Signed)
Patient notified of info below. He will call back to schedule appt if no improvement. Hubbard Hartshorn, BSN, RN-BC

## 2020-04-20 ENCOUNTER — Telehealth: Payer: Self-pay | Admitting: Emergency Medicine

## 2020-04-20 DIAGNOSIS — J449 Chronic obstructive pulmonary disease, unspecified: Secondary | ICD-10-CM

## 2020-04-20 DIAGNOSIS — Z9981 Dependence on supplemental oxygen: Secondary | ICD-10-CM

## 2020-04-20 NOTE — Telephone Encounter (Signed)
Called and spoke with pt's sister [name] who stated pt is needing a new regulator that goes on O2 tanks as the numbers have come off of the current regulator that they have and due to this, pt is not able to see how many liters he is using.  Stated to her that I would place Rx to [employer] to see if they are able to provide them with a regulator and she verbalized understanding. Nothing further needed.

## 2020-04-26 ENCOUNTER — Telehealth: Payer: Self-pay | Admitting: Emergency Medicine

## 2020-04-26 DIAGNOSIS — R600 Localized edema: Secondary | ICD-10-CM

## 2020-04-26 NOTE — Telephone Encounter (Signed)
Called and spoke with home health RN who states Patient has pitting edema in right leg per Home health nurse, pt states it's been the last few days. Almyra Free states pt said it's been a few days. Pt has expiratory wheezing. Denies redness or warm to touch, denies cough, fever. Pt gave verbal permission to talk to RN.    [name] please advise

## 2020-04-26 NOTE — Telephone Encounter (Signed)
If he is willing to do so, then I think we need to perform bilateral LE doppler US to rule out DVT. He probably needs a phone OV with RB or APP to review the study, discuss the wheezing.

## 2020-04-26 NOTE — Telephone Encounter (Signed)
LMTCB for pt on his home number

## 2020-04-26 NOTE — Telephone Encounter (Signed)
LMTCB for [employer]

## 2020-04-26 NOTE — Telephone Encounter (Signed)
Called the pt again and had to [facility]  Will need to try back tomorrow 04/26/20

## 2020-04-26 NOTE — Telephone Encounter (Signed)
Spoke with Almyra Free and notified of Dr Agustina Caroli response. She verbalized understanding. I advised need pt's ok to proceed with dopplers and then will order and schedule appt.   Called pt again- still no answer. Derrell Lolling, sister on [place]- no answer and VM full. Called and LMTCB for Sandi- pt's other sister on [place].

## 2020-04-27 NOTE — Telephone Encounter (Signed)
Frank [name] sister is returning phone call. Frank [name] phone number is [phone].

## 2020-04-27 NOTE — Telephone Encounter (Signed)
Spoke with the pt and notified him of recommendations from Dr Lamonte Sakai  He verbalized understanding and okay to proceed with dopplers  I have sent order to [facility] for this and advised them in scheduling comments to set up televisit w APP after done.

## 2020-04-28 ENCOUNTER — Ambulatory Visit (HOSPITAL_COMMUNITY)
Admission: RE | Admit: 2020-04-28 | Discharge: 2020-04-28 | Disposition: A | Discharge status: Home / Self Care | Payer: Medicare Other | Source: Ambulatory Visit | Attending: Cardiovascular Disease | Admitting: Cardiovascular Disease

## 2020-04-28 ENCOUNTER — Encounter (HOSPITAL_COMMUNITY): Payer: Self-pay

## 2020-04-28 ENCOUNTER — Other Ambulatory Visit: Payer: Self-pay

## 2020-04-28 DIAGNOSIS — R600 Localized edema: Secondary | ICD-10-CM | POA: Diagnosis not present

## 2020-04-28 NOTE — Progress Notes (Unsigned)
lower venous has been completed and is negative for DVT. Preliminary results can be found under CV proc through chart review. Wilkie Aye RVT [facility]

## 2020-04-28 NOTE — Telephone Encounter (Signed)
Nothing noted in message. Will close encounter as it was created in error.  °

## 2020-05-05 DIAGNOSIS — M25571 Pain in right ankle and joints of right foot: Secondary | ICD-10-CM | POA: Diagnosis not present

## 2020-05-06 ENCOUNTER — Other Ambulatory Visit: Payer: Self-pay

## 2020-05-06 ENCOUNTER — Ambulatory Visit: Payer: Medicare Other | Admitting: Primary Care

## 2020-05-06 ENCOUNTER — Encounter: Payer: Self-pay | Admitting: Primary Care

## 2020-05-06 VITALS — BP 116/64 | HR 82 | Ht 66.0 in | Wt 152.2 lb

## 2020-05-06 DIAGNOSIS — J9611 Chronic respiratory failure with hypoxia: Secondary | ICD-10-CM

## 2020-05-06 DIAGNOSIS — J449 Chronic obstructive pulmonary disease, unspecified: Secondary | ICD-10-CM | POA: Diagnosis not present

## 2020-05-06 DIAGNOSIS — U071 COVID-19: Secondary | ICD-10-CM

## 2020-05-06 DIAGNOSIS — R5381 Other malaise: Secondary | ICD-10-CM | POA: Diagnosis not present

## 2020-05-06 DIAGNOSIS — Z9981 Dependence on supplemental oxygen: Secondary | ICD-10-CM

## 2020-05-06 NOTE — Progress Notes (Signed)
@Patient  ID: , male    DOB: [birth year], 64 y.o.   MRN: [id number]  Chief Complaint  Patient presents with  . Follow-up    Pt states he is doing better since last visit and denies any complaints.    Referring provider: [id number], MD  HPI: 64 year old male, former smoker quit in 2017 (17-1/2-pack-year history).  Significant for severe COPD, chronic story failure with hypoxia, allergic rhinitis. Patient of Dr. 2018, last seen in office on 02/24/2020. Maintained on Trelegy. He has been tried on chronic prednisone in the past but stopped d/t possible deleterious, may consider restating depending on his breathing. Started conversations about palliative care. Hospitalized from 02/28/20-03/06/20 for COPD exacerbation d/t covid-19 pneumonia, acute and chronic hypoxic respiratory failure.  Previous [facility] pulmonary encounters:  03/31/2020 Patient presents today for hospital follow-up following COPD exacerbation due to COVID-19 pneumonia. He was admitted to the hospital from 02/28/20 -03/06/20 for COPD exacerbation d/t covid-19. He was vaccinated for Covid, series completed in March 2021. He did not require additional oxygen, desaturates with ambulation. Patient received 5 days of treatment with remdesivir and 8 days of Decadron.   In terms of breathing, his wife feels that he is back to his baseline. He has a dry cough, no mucus production. He has chronic nasal congestion, using flonase daily. He is compliant with Trelegy. He uses albuterol hfa as needed 0-2 times a day. He continues to use 4L oxygen. He still does not have much appetite, he is working with physical therapist twice a week. Notices leg weakness when he over does it.   05/06/2020 - Interim hx  Patient presents today for 4-6 week follow-up COVID-19/COPD. Accompanied by his wife. He is doing well, no acute complaints. Breathing is baseline before he had covid. He is not experiencing any increase in dyspnea symptoms. Dry  cough has resolved. He is using Trelegy 100 one puff daily. He used albuterol rescue inhaler yesterday once after being seen by orthopedic doctor where he had to walk a long distance. He sprained right ankle, dopplers negative for DVT. He is wearing brace. He is on 3-4L oxygen continuously. No new O2 requirements, he was on oxygen before getting covid. Wife states that he was sent [facility] after being hospitalized with covid. This wheelchair is too heavy for them to use outside the house. He has difficulty walking long distances without becoming dyspneic d/t severe COPD.  Denies f/c/s, cough, chest tightness or wheezing.    No Known Allergies  Immunization History  Administered Date(s) Administered  . Fluad Quad(high Dose 65+) 02/24/2020  . Influenza Inj Mdck Quad Pf 02/07/2017  . Influenza Split 06/07/2012, 02/05/2017  . Influenza, High Dose Seasonal PF 03/18/2019  . Influenza, Quadrivalent, Recombinant, Inj, Pf 02/05/2018  . Influenza,inj,Quad PF,6+ Mos 01/29/2013, 02/04/2016, 03/07/2016  . Influenza-Unspecified 01/19/2015, 02/07/2017  . PFIZER SARS-COV-2 Vaccination 08/03/2019, 08/24/2019  . Pneumococcal Conjugate-13 11/13/2013  . Pneumococcal Polysaccharide-23 06/07/2012, 07/08/2019  . Tdap 06/18/2012    Past Medical History:  Diagnosis Date  . COPD (chronic obstructive pulmonary disease) ([facility]) 06/05/2012  . Dyspnea   . Dysrhythmia   . Emphysema 06/07/2012  . Heart murmur    hx of as baby  . History of kidney stones   . Hypoxemia 06/07/2012  . Obstructive chronic bronchitis with exacerbation ([facility]) 06/07/2012  . Pneumonia   . Tobacco abuse 06/05/2012    Tobacco History: Social History   Tobacco Use  Smoking Status Former Smoker  . Packs/day: 0.50  .  Years: 35.00  . Pack years: 17.50  . Types: Cigarettes  . Quit date: 05/11/2015  . Years since quitting: 4.9  Smokeless Tobacco Former Systems developer  . Types: Chew  . Quit date: 05/08/1968   Counseling given: Not  Answered   Outpatient Medications Prior to Visit  Medication Sig Dispense Refill  . albuterol (ACCUNEB) 0.63 MG/3ML nebulizer solution USE 1 VIAL IN NEBULIZER EVERY 6 HOURS (Patient taking differently: Take 1 ampule by nebulization every 6 (six) hours.) 360 mL 5  . albuterol (VENTOLIN HFA) 108 (90 Base) MCG/ACT inhaler INHALE 2 PUFFS BY MOUTH EVERY 4 HOURS AS NEEDED FOR WHEEZING OR SHORTNESS OF BREATH (Patient taking differently: Inhale 2 puffs into the lungs every 4 (four) hours as needed for wheezing or shortness of breath.) 18 g 5  . fluticasone (FLONASE) 50 MCG/ACT nasal spray Place 1 spray into both nostrils daily. (Patient taking differently: Place 1 spray into both nostrils daily as needed for allergies or rhinitis.) 16 g 3  . Fluticasone-Umeclidin-Vilant (TRELEGY ELLIPTA) 100-62.5-25 MCG/INH AEPB Inhale 1 puff into the lungs daily. 28 each 6  . OXYGEN Inhale 4 L/min into the lungs continuous.      No facility-administered medications prior to visit.    Review of Systems  Review of Systems  Constitutional: Negative.   Respiratory: Negative for cough, chest tightness, shortness of breath and wheezing.        Baseline dyspnea  Cardiovascular: Positive for leg swelling.    Physical Exam  BP 116/64 (BP Location: Left Arm, Cuff Size: Normal)   Pulse 82   Ht 5\' 6"  (1.676 m)   Wt 152 lb 3.2 oz (69 kg)   SpO2 94%   BMI 24.57 kg/m  Physical Exam Constitutional:      Appearance: Normal appearance.  HENT:     Head: Normocephalic and atraumatic.     Mouth/Throat:     Mouth: Mucous membranes are moist.     Pharynx: Oropharynx is clear.  Cardiovascular:     Rate and Rhythm: Normal rate and regular rhythm.  Pulmonary:     Effort: Pulmonary effort is normal.     Breath sounds: Wheezing present. No rhonchi or rales.  Musculoskeletal:     Cervical back: Normal range of motion and neck supple.     Right lower leg: Edema present.  Neurological:     General: No focal deficit  present.     Mental Status: He is alert and oriented to person, place, and time. Mental status is at baseline.  Psychiatric:        Mood and Affect: Mood normal.        Behavior: Behavior normal.        Thought Content: Thought content normal.        Judgment: Judgment normal.      Lab Results:  CBC    Component Value Date/Time   WBC 6.5 03/04/2020 0048   RBC 5.09 03/04/2020 0048   HGB 14.3 03/04/2020 0048   HGB 15.0 07/08/2019 1418   HCT 45.2 03/04/2020 0048   HCT 45.0 07/08/2019 1418   PLT 234 03/04/2020 0048   PLT 285 07/08/2019 1418   MCV 88.8 03/04/2020 0048   MCV 87 07/08/2019 1418   MCH 28.1 03/04/2020 0048   MCHC 31.6 03/04/2020 0048   RDW 11.9 03/04/2020 0048   RDW 12.9 07/08/2019 1418   LYMPHSABS 0.8 03/04/2020 0048   MONOABS 0.2 03/04/2020 0048   EOSABS 0.0 03/04/2020 0048   BASOSABS 0.0 03/04/2020  0048    BMET    Component Value Date/Time   NA 137 03/04/2020 0048   NA 139 07/08/2019 1418   K 4.5 03/04/2020 0048   CL 97 (L) 03/04/2020 0048   CO2 31 03/04/2020 0048   GLUCOSE 146 (H) 03/04/2020 0048   BUN 25 (H) 03/04/2020 0048   BUN 13 07/08/2019 1418   CREATININE 0.81 03/06/2020 0237   CALCIUM 8.8 (L) 03/04/2020 0048   GFRNONAA >60 03/06/2020 0237   GFRAA >60 10/05/2019 0950    BNP    Component Value Date/Time   BNP 51.1 02/28/2020 1148    ProBNP    Component Value Date/Time   PROBNP 47.4 02/20/2014 1505    Imaging: VAS [place] LOWER EXTREMITY VENOUS (DVT)  Result Date: 04/28/2020  Lower Venous DVT Study Other Indications: Patient complains of right foot swelling for one week. Risk Factors: None identified. Performing Technologist: Wilkie Aye RVT  Examination Guidelines: A complete evaluation includes B-mode imaging, spectral Doppler, color Doppler, and power Doppler as needed of all accessible portions of each vessel. Bilateral testing is considered an integral part of a complete examination. Limited examinations for reoccurring indications  may be performed as noted. The reflux portion of the exam is performed with the patient in reverse Trendelenburg.  +---------+---------------+---------+-----------+----------+--------------+ RIGHT    CompressibilityPhasicitySpontaneityPropertiesThrombus Aging +---------+---------------+---------+-----------+----------+--------------+ CFV      Full           Yes      Yes                                 +---------+---------------+---------+-----------+----------+--------------+ SFJ      Full           Yes      Yes                                 +---------+---------------+---------+-----------+----------+--------------+ FV Prox  Full           Yes      Yes                                 +---------+---------------+---------+-----------+----------+--------------+ FV Mid   Full           Yes      Yes                                 +---------+---------------+---------+-----------+----------+--------------+ FV DistalFull           Yes      Yes                                 +---------+---------------+---------+-----------+----------+--------------+ PFV      Full                                                        +---------+---------------+---------+-----------+----------+--------------+ POP      Full           Yes      Yes                                 +---------+---------------+---------+-----------+----------+--------------+  PTV      Full           Yes      Yes                                 +---------+---------------+---------+-----------+----------+--------------+ PERO     Full           Yes      Yes                                 +---------+---------------+---------+-----------+----------+--------------+ Gastroc  Full                                                        +---------+---------------+---------+-----------+----------+--------------+ GSV      Full           Yes      Yes                                  +---------+---------------+---------+-----------+----------+--------------+   +----+---------------+---------+-----------+----------+--------------+ LEFTCompressibilityPhasicitySpontaneityPropertiesThrombus Aging +----+---------------+---------+-----------+----------+--------------+ CFV Full           Yes      Yes                                 +----+---------------+---------+-----------+----------+--------------+  Summary: RIGHT: - No evidence of deep vein thrombosis in the lower extremity. No indirect evidence of obstruction proximal to the inguinal ligament. - No cystic structure found in the popliteal fossa.  LEFT: - No evidence of common femoral vein obstruction.  *See table(s) above for measurements and observations. Electronically signed by Jenkins Rouge MD on 04/28/2020 at 3:25:26 PM.    Final      Assessment & Plan:   COPD (chronic obstructive pulmonary disease) ([facility]) - Stable, breathing is baseline. No acute complaints. He had scattered wheezing t/o lung fields, rare SABA use.  - Continue Trelegy 100 one puff daily; albuterol 2 puffs q 6 hours prn sob/wheezing. Could consider increasing Trelegy to 264mcg in the future if needed. - Needs transfer wheelchair for long distances, unable to walk more than 241ft without becoming dyspneic d/t severe COPD. DME order placed  - FU in 3 months   Chronic respiratory failure with hypoxia, on home O2 therapy ([place]) - Continue oxygen @ 3-4L/min to maintain O2 >88-90%   COVID-19 - Hospitalized 02/28/20- 03/06/20, received Remdesivir and Decadron  - Respiratory status is back to baseline pre-covid - Due for covid-19 booster in Mid-late January 2022    Martyn Ehrich, NP 05/06/2020

## 2020-05-06 NOTE — Assessment & Plan Note (Signed)
-   Hospitalized 02/28/20- 03/06/20, received Remdesivir and Decadron  - Respiratory status is back to baseline pre-covid - Due for covid-19 booster in Mid-late January 2022

## 2020-05-06 NOTE — Patient Instructions (Signed)
Good seeing you today Frank [name], glad you continue to do well  Recommendations: - Continue Trelegy 100 one puff daily  - Continue Albuterol 2 puff every 4-6 hours as needed for breakthrough shortness of breath/wheeing - If you find yourself wheezing more or needing to use rescue inhaler more than once a day consider increasing Trelegy to 200 DOSE  - YOU can get booster 90 days after having covid - recommend getting covid booster mid-late January   Orders: - Lift wheelchair with [employer] re: deconditioning/COPD   Follow-up: - 3 months with Dr. Delton [name] or sooner if needed

## 2020-05-06 NOTE — Assessment & Plan Note (Addendum)
-   Continue oxygen @ 3-4L/min to maintain O2 >88-90%

## 2020-05-06 NOTE — Assessment & Plan Note (Addendum)
-   Stable, breathing is baseline. No acute complaints. He had scattered wheezing t/o lung fields, rare SABA use.  - Continue Trelegy 100 one puff daily; albuterol 2 puffs q 6 hours prn sob/wheezing. Could consider increasing Trelegy to in the future if needed. - Needs transfer wheelchair for long distances, unable to walk more than 273ft without becoming dyspneic d/t severe COPD. DME order placed  - FU in 3 months

## 2020-05-08 DIAGNOSIS — J449 Chronic obstructive pulmonary disease, unspecified: Secondary | ICD-10-CM | POA: Diagnosis not present

## 2020-05-08 DIAGNOSIS — U071 COVID-19: Secondary | ICD-10-CM | POA: Diagnosis not present

## 2020-05-10 ENCOUNTER — Encounter: Payer: Medicare Other | Admitting: Student

## 2020-05-10 ENCOUNTER — Telehealth: Payer: Self-pay | Admitting: Primary Care

## 2020-05-10 DIAGNOSIS — R5381 Other malaise: Secondary | ICD-10-CM

## 2020-05-10 DIAGNOSIS — J449 Chronic obstructive pulmonary disease, unspecified: Secondary | ICD-10-CM

## 2020-05-10 NOTE — Telephone Encounter (Signed)
Response from [employer] for the DME order for a lift wheelchair:    good morning, Holly. just to clarify the order for this patient, he just needs a lighter wheelchair, is that correct? he received a 20x16 lightweight from [place] on 10/30, so if he needs a smaller one, i can do a switch out to provide him with an 18x16 lightweight. please advise, thanks.   &   also, if it should be an exchange, can you have the order rewritten to state that he needs a smaller wheelchair? thanks   Please advise.

## 2020-05-10 NOTE — Telephone Encounter (Signed)
Light weight  wheelchair please (I think he referred to it as lift wc)

## 2020-05-10 NOTE — Telephone Encounter (Signed)
New order placed for the DME --[employer] to exchange out the wheelchair that he has now for a 18x16 lightweight wheelchair.

## 2020-05-10 NOTE — Telephone Encounter (Signed)
BW should the new order be for a lighter wheelchair or a lift wheelchair?   See the comment from [employer] to [facility] in this message.  Thanks

## 2020-06-01 ENCOUNTER — Other Ambulatory Visit: Payer: Self-pay | Admitting: Emergency Medicine

## 2020-06-06 DIAGNOSIS — U071 COVID-19: Secondary | ICD-10-CM | POA: Diagnosis not present

## 2020-06-06 DIAGNOSIS — J449 Chronic obstructive pulmonary disease, unspecified: Secondary | ICD-10-CM | POA: Diagnosis not present

## 2020-07-05 DIAGNOSIS — U071 COVID-19: Secondary | ICD-10-CM | POA: Diagnosis not present

## 2020-07-05 DIAGNOSIS — J449 Chronic obstructive pulmonary disease, unspecified: Secondary | ICD-10-CM | POA: Diagnosis not present

## 2020-07-26 ENCOUNTER — Other Ambulatory Visit: Payer: Self-pay | Admitting: Pulmonary Disease

## 2020-08-04 DIAGNOSIS — U071 COVID-19: Secondary | ICD-10-CM | POA: Diagnosis not present

## 2020-08-04 DIAGNOSIS — J449 Chronic obstructive pulmonary disease, unspecified: Secondary | ICD-10-CM | POA: Diagnosis not present

## 2020-08-10 ENCOUNTER — Other Ambulatory Visit: Payer: Self-pay

## 2020-08-10 ENCOUNTER — Ambulatory Visit: Payer: Medicare Other | Admitting: Emergency Medicine

## 2020-08-10 ENCOUNTER — Encounter: Payer: Self-pay | Admitting: Emergency Medicine

## 2020-08-10 ENCOUNTER — Encounter: Payer: Self-pay | Admitting: *Deleted

## 2020-08-10 DIAGNOSIS — J9611 Chronic respiratory failure with hypoxia: Secondary | ICD-10-CM | POA: Diagnosis not present

## 2020-08-10 DIAGNOSIS — J449 Chronic obstructive pulmonary disease, unspecified: Secondary | ICD-10-CM | POA: Diagnosis not present

## 2020-08-10 DIAGNOSIS — Z9981 Dependence on supplemental oxygen: Secondary | ICD-10-CM

## 2020-08-10 DIAGNOSIS — J301 Allergic rhinitis due to pollen: Secondary | ICD-10-CM | POA: Diagnosis not present

## 2020-08-10 MED ORDER — FLUTICASONE PROPIONATE 50 MCG/ACT NA SUSP: 1.0000 | Freq: Every day | NASAL | 3 refills | Status: DC

## 2020-08-10 MED ORDER — PREDNISONE 10 MG PO TABS: 10.0000 mg | ORAL_TABLET | Freq: Every day | ORAL | 0 refills | Status: DC

## 2020-08-10 NOTE — Progress Notes (Signed)

## 2020-08-10 NOTE — Addendum Note (Signed)
Addended by: Gavin Potters R on: 08/10/2020 03:21 PM   Modules accepted: Orders

## 2020-08-10 NOTE — Assessment & Plan Note (Addendum)
I do believe he is benefiting from palliative care support.  He still has baseline severe exertional dyspnea.  I would like to retry him on scheduled prednisone to see if he gets benefit.  Continue his current bronchodilator regimen.  We could consider converting Trelegy to alternative ICS/LABA/LAMA going forward to see if he gets more benefit  Continue Trelegy 1 inhalation once daily for now.  Rinse and gargle after using. Keep your albuterol available to use either 2 puffs or 1 nebulizer treatment up to every 4 hours when needed for shortness of breath, chest tightness, wheezing. Please start prednisone 10 mg daily.  Take this every day until next visit when we will discuss whether it has been helpful. Follow-up with [name] in a phone visit in 2 months so that we can discuss how you are doing on the new medication

## 2020-08-10 NOTE — Progress Notes (Signed)
Subjective:    Patient ID: Frank [name], male    DOB: 1956-01-07, 65 y.o.   MRN: [id number]  COPD His past medical history is significant for COPD.   ROV 02/24/20 --65 year old man with severe COPD, chronic hypoxemic respiratory failure, nasal obstruction and allergic rhinitis.  He is currently on Trelegy.  We had tried him on chronic prednisone to see if he would get benefit but he thought that it may have been deleterious so we stopped it in July.  We had talked about possible palliative care and since our last visit we did initiate. He has been getting home visits about every 2 weeks. He started a flutter valve. Has a lot of nasal drainage, still on flonase, zyrtec. Rare GERD. Uses albuterol nebs tid, uses albuterol HFA 0-2x a day. Still able to get around but slow. He is working on getting a wheelchair.   R OV 08/10/2020 --Frank [name] is 64 and follows up today for his chronic hypoxemic respiratory failure due to very severe COPD.  He had COVID-19 in October 2021.  He requires oxygen at 4 L/min.  We have tried him on chronic prednisone but he did not feel that it helped him much.  He is managed on Trelegy.  He uses his albuterol approximately 2x a day. He has had more exertional SOB for the last 2 weeks. ? Increased mucous from pollen but no wheeze or cough.  He is not taking flonase right now. He has restarted loratadine about 3 days ago.   MDM: Reviewed pulmonary notes from 05/06/2020, 03/31/2020 Reviewed hospitalization notes from late October 2021    PULMONARY FUNCTON TEST 08/30/2012  FVC 3.93  FEV1 1.69  FEV1/FVC 43  FVC  % Predicted 93  FEV % Predicted 55  FeF 25-75 .71  FeF 25-75 % Predicted 3.05    Review of Systems As per HPI     Objective:   Physical Exam Vitals:   08/10/20 1442  BP: 118/72  Pulse: 90  Temp: 97.9 F (36.6 C)  TempSrc: Temporal  SpO2: 94%  Weight: 153 lb 12.8 oz (69.8 kg)  Height: 5\' 6"  (1.676 m)   Gen: Pleasant, well-nourished, in no distress,   normal affect  ENT: No lesions,  mouth clear,  oropharynx clear, no postnasal drip  Neck: No JVD, no stridor  Lungs: No use of accessory muscles, very distant, end expiratory wheezes bilaterally  Cardiovascular: RRR, heart sounds normal, no murmur or gallops, no peripheral edema  Musculoskeletal: No deformities, no cyanosis or clubbing  Neuro: alert, non focal  Skin: Warm, no lesions or rashes     Assessment & Plan:  COPD (chronic obstructive pulmonary disease) ([place]) I do believe he is benefiting from palliative care support.  He still has baseline severe exertional dyspnea.  I would like to retry him on scheduled prednisone to see if he gets benefit.  Continue his current bronchodilator regimen.  We could consider converting Trelegy to alternative ICS/LABA/LAMA going forward to see if he gets more benefit  Continue Trelegy 1 inhalation once daily for now.  Rinse and gargle after using. Keep your albuterol available to use either 2 puffs or 1 nebulizer treatment up to every 4 hours when needed for shortness of breath, chest tightness, wheezing. Please start prednisone 10 mg daily.  Take this every day until next visit when we will discuss whether it has been helpful. Follow-up with [name] in a phone visit in 2 months so that we can discuss how you are  doing on the new medication  Chronic respiratory failure with hypoxia, on home O2 therapy ([place]) Continue your oxygen at 3-4 L/min depending on your level of activity.   Allergic rhinitis Start taking your fluticasone nasal spray, 2 sprays each nostril every day through the spring allergy season.  Then you can go back to using it as needed. Continue loratadine (Claritin) 10 mg once daily.  [name], MD, PhD 08/10/2020, 3:07 PM [facility] [phone] or if no answer [phone]

## 2020-08-10 NOTE — Assessment & Plan Note (Signed)
Start taking your fluticasone nasal spray, 2 sprays each nostril every day through the spring allergy season.  Then you can go back to using it as needed. Continue loratadine (Claritin) 10 mg once daily.

## 2020-08-10 NOTE — Assessment & Plan Note (Signed)
Continue your oxygen at 3-4 L/min depending on your level of activity.

## 2020-08-10 NOTE — Patient Instructions (Signed)
Continue Trelegy 1 inhalation once daily for now.  Rinse and gargle after using. Keep your albuterol available to use either 2 puffs or 1 nebulizer treatment up to every 4 hours when needed for shortness of breath, chest tightness, wheezing. Please start prednisone 10 mg daily.  Take this every day until next visit when we will discuss whether it has been helpful. Continue your oxygen at 3-4 L/min depending on your level of activity. Start taking your fluticasone nasal spray, 2 sprays each nostril every day through the spring allergy season.  Then you can go back to using it as needed. Continue loratadine (Claritin) 10 mg once daily. Follow-up with [name] in a phone visit in 2 months so that we can discuss how you are doing on the new medication

## 2020-08-13 ENCOUNTER — Telehealth: Payer: Self-pay | Admitting: Emergency Medicine

## 2020-08-13 DIAGNOSIS — R600 Localized edema: Secondary | ICD-10-CM

## 2020-08-13 NOTE — Telephone Encounter (Signed)
Called and spoke with Almyra Free with [facility]. She stated that she just completed her weekly visit with Frank [name]. He was complaining about his right foot being swollen and painful. She took a look at his foot and did notice it was swollen. It was not warm to touch. His foot does not seem to be discolored. She did state that this is the same foot that he sprung a few months ago but with the patient also having some SOB, she wondered if he was starting to retain fluid.   She did state that the patient was in to see RB on 08/10/20 but the patient forgot to mention it during his OV. With it being the weekend, she wanted to make someone aware in case he needs to be on a diuretic.   Dr. Vaughan Browner, can you please advise since RB is not available today?

## 2020-08-13 NOTE — Telephone Encounter (Signed)
Called and spoke with Almyra Free. She verbalized understanding. She will call and let the family know. Will go ahead and place the order. Per Dr. Vaughan Browner, to be ordered under [name] name.   Nothing further needed at time of call.

## 2020-08-13 NOTE — Telephone Encounter (Signed)
If only one foot is swollen it is unlikely to be fluid Please order an ultrasound of the right leg to rule out clot If breathing gets worse then he will need to go to the emergency room for evaluation

## 2020-08-16 ENCOUNTER — Ambulatory Visit (HOSPITAL_COMMUNITY)
Admission: RE | Admit: 2020-08-16 | Discharge: 2020-08-16 | Disposition: A | Discharge status: Home / Self Care | Payer: Medicare Other | Source: Ambulatory Visit | Attending: Cardiology | Admitting: Cardiology

## 2020-08-16 ENCOUNTER — Other Ambulatory Visit: Payer: Self-pay

## 2020-08-16 DIAGNOSIS — R600 Localized edema: Secondary | ICD-10-CM | POA: Diagnosis not present

## 2020-08-18 NOTE — Progress Notes (Signed)
Things That May Be Affecting Your Health:  Alcohol  Hearing loss  Pain    Depression  Home Safety  Sexual Health   Diabetes + Lack of physical activity  Stress   Difficulty with daily activities  Loneliness  Tiredness   Drug use  Medicines  Tobacco use   Falls  Motor Vehicle Safety  Weight   Food choices  Oral Health  Other    YOUR PERSONALIZED HEALTH PLAN : 1. Schedule your next subsequent Medicare Wellness visit in one year 2. Attend all of your regular appointments to address your medical issues 3. Complete the preventative screenings and services   Annual Wellness Visit   Medicare Covered Preventative Screenings and [place] Men and Women Who How Often Need? Date of Last Service Action  Abdominal Aortic Aneurysm Adults with AAA risk factors Once + CT abdomen 06/2016    Alcohol Misuse and Counseling All Adults Screening once a year if no alcohol misuse. Counseling up to 4 face to face sessions.     Bone Density Measurement  Adults at risk for osteoporosis Once every 2 yrs      Lipid Panel Z13.6 All adults without CV disease Once every 5 yrs +  2016    Colorectal Cancer   Stool sample or  Colonoscopy All adults 50 and older   Once every year  Every 10 years +   FOBT 2016    Depression All Adults Once a year     Diabetes Screening Blood glucose, post glucose load, or GTT Z13.1  All adults at risk  Pre-diabetics  Once per year  Twice per year      Diabetes  Self-Management Training All adults Diabetics 10 hrs first year; 2 hours subsequent years. Requires Copay     Glaucoma  Diabetics  Family history of glaucoma  African Americans 40 yrs +  Hispanic Americans 14 yrs + Annually - requires coppay      Hepatitis C Z72.89 or F19.20  High Risk for HCV  Born between 1945 and 1965  Annually  Once      HIV Z11.4 All adults based on risk  Annually btw ages 38 & 52 regardless of risk  Annually > 65 yrs if at increased risk       Lung Cancer Screening Asymptomatic adults aged 59-77 with 30 pack yr history and current smoker OR quit within the last 15 yrs Annually Must have counseling and shared decision making documentation before first screen      Medical Nutrition Therapy Adults with   Diabetes  Renal disease  Kidney transplant within past 3 yrs 3 hours first year; 2 hours subsequent years     Obesity and Counseling All adults Screening once a year Counseling if BMI 30 or higher  Today   Tobacco Use Counseling Adults who use tobacco  Up to 8 visits in one year     Vaccines Z23  Hepatitis B  Influenza   Pneumonia  Adults   Once  Once every flu season  Two different vaccines separated by one year     Next Annual Wellness Visit People with Medicare Every year  Today     Services & Screenings Women Who How Often Need  Date of Last Service Action  Mammogram  Z12.31 Women over 72 One baseline ages 48-39. Annually ager 40 yrs+      Pap tests All women Annually if high risk. Every 2 yrs for normal risk women  Screening for cervical cancer with   Pap (Z01.419 nl or Z01.411abnl) &  HPV Z11.51 Women aged 69 to 2 Once every 5 yrs     Screening pelvic and breast exams All women Annually if high risk. Every 2 yrs for normal risk women     Sexually Transmitted Diseases  Chlamydia  Gonorrhea  Syphilis All at risk adults Annually for non pregnant females at increased risk         [place] Men Who How Ofter Need  Date of Last Service Action  Prostate Cancer - DRE & PSA Men over 50 Annually.  DRE might require a copay. +       Sexually Transmitted Diseases  Syphilis All at risk adults Annually for men at increased risk      Health Maintenance List Health Maintenance  Topic Date Due  . COLON CANCER SCREENING ANNUAL FOBT  06/19/2015  . COLONOSCOPY (Pts 45-16yrs Insurance coverage will need to be confirmed)  10/10/2015  . COVID-19 Vaccine (3 - Booster for Pfizer series)  02/23/2020  . INFLUENZA VACCINE  12/06/2020  . TETANUS/TDAP  06/18/2022  . Hepatitis C Screening  Completed  . PNA vac Low Risk Adult  Completed  . HPV VACCINES  Aged Out

## 2020-09-04 DIAGNOSIS — U071 COVID-19: Secondary | ICD-10-CM | POA: Diagnosis not present

## 2020-09-04 DIAGNOSIS — J449 Chronic obstructive pulmonary disease, unspecified: Secondary | ICD-10-CM | POA: Diagnosis not present

## 2020-09-08 ENCOUNTER — Telehealth: Payer: Self-pay | Admitting: Emergency Medicine

## 2020-09-08 MED ORDER — PREDNISONE 10 MG PO TABS: 10.0000 mg | ORAL_TABLET | Freq: Every day | ORAL | 3 refills | Status: DC

## 2020-09-08 NOTE — Telephone Encounter (Signed)
Called and spoke with patient, advised of recommendations per [name].  Patient states he is still using the fluticasone and does not currently need any refills on that, he did state that he needs a refill of the Prednisone.  Verified pharmacy with patient and script sent.  Patient verbalized understanding.  Nothing further needed.

## 2020-09-08 NOTE — Telephone Encounter (Signed)
He should still be on fluticasone nasal spray daily and I had intended for him to stay on the prednisone 10mg  qd indefinitely. If he stopped it then he needs to restart

## 2020-09-08 NOTE — Telephone Encounter (Signed)
Primary Pulmonologist: RB Last office visit and with whom: 08/10/20 What do we see them for (pulmonary problems): COPD Last OV assessment/plan: Continue Trelegy 1 inhalation once daily for now.  Rinse and gargle after using. Keep your albuterol available to use either 2 puffs or 1 nebulizer treatment up to every 4 hours when needed for shortness of breath, chest tightness, wheezing. Please start prednisone 10 mg daily.  Take this every day until next visit when we will discuss whether it has been helpful. Continue your oxygen at 3-4 L/min depending on your level of activity. Start taking your fluticasone nasal spray, 2 sprays each nostril every day through the spring allergy season.  Then you can go back to using it as needed. Continue loratadine (Claritin) 10 mg once daily. Follow-up with [name] in a phone visit in 2 months so that we can discuss how you are doing on the new medication  Was appointment offered to patient (explain)?  Wanted recommendations for now   Reason for call: Spoke with Almyra Free from [facility]. She stated that the patient has been struggling with his allergies for the past few days. He recently completed the 10mg  prednisone that RB sent in for him on 08/10/20. He has been taking Claritin 10mg  daily as well as his Trelegy 180mcg. He has been complaining of more nasal and chest congestion. So far the phlegm has been clear. SOB has been stable. Denied any fevers or body aches. Also denied being around anyone who has had covid recently.   She wanted to know if RB would recommend anything in addition to the Claritin 10mg .   (examples of things to ask: : When did symptoms start? Fever? Cough? Productive? Color to sputum? More sputum than usual? Wheezing? Have you needed increased oxygen? Are you taking your respiratory medications? What over the counter measures have you tried?)  No Known Allergies  Immunization History  Administered Date(s) Administered  . Fluad  Quad(high Dose 65+) 02/24/2020  . Influenza Inj Mdck Quad Pf 02/07/2017  . Influenza Split 06/07/2012, 02/05/2017  . Influenza, High Dose Seasonal PF 03/18/2019  . Influenza, Quadrivalent, Recombinant, Inj, Pf 02/05/2018  . Influenza,inj,Quad PF,6+ Mos 01/29/2013, 02/04/2016, 03/07/2016  . Influenza-Unspecified 01/19/2015, 02/07/2017  . PFIZER(Purple Top)SARS-COV-2 Vaccination 08/03/2019, 08/24/2019  . Pneumococcal Conjugate-13 11/13/2013  . Pneumococcal Polysaccharide-23 06/07/2012, 07/08/2019  . Tdap 06/18/2012   Pharmacy is [facility] on [address].   RB, can you please advise? Thanks!

## 2020-10-04 DIAGNOSIS — U071 COVID-19: Secondary | ICD-10-CM | POA: Diagnosis not present

## 2020-10-04 DIAGNOSIS — J449 Chronic obstructive pulmonary disease, unspecified: Secondary | ICD-10-CM | POA: Diagnosis not present

## 2020-10-05 DIAGNOSIS — Z9981 Dependence on supplemental oxygen: Secondary | ICD-10-CM | POA: Diagnosis not present

## 2020-10-05 DIAGNOSIS — J449 Chronic obstructive pulmonary disease, unspecified: Secondary | ICD-10-CM | POA: Diagnosis not present

## 2020-10-30 DIAGNOSIS — U071 COVID-19: Secondary | ICD-10-CM | POA: Diagnosis not present

## 2020-10-30 DIAGNOSIS — J449 Chronic obstructive pulmonary disease, unspecified: Secondary | ICD-10-CM | POA: Diagnosis not present

## 2020-11-04 DIAGNOSIS — U071 COVID-19: Secondary | ICD-10-CM | POA: Diagnosis not present

## 2020-11-04 DIAGNOSIS — Z9981 Dependence on supplemental oxygen: Secondary | ICD-10-CM | POA: Diagnosis not present

## 2020-11-04 DIAGNOSIS — J449 Chronic obstructive pulmonary disease, unspecified: Secondary | ICD-10-CM | POA: Diagnosis not present

## 2020-11-05 DIAGNOSIS — U071 COVID-19: Secondary | ICD-10-CM | POA: Diagnosis not present

## 2020-11-05 DIAGNOSIS — J449 Chronic obstructive pulmonary disease, unspecified: Secondary | ICD-10-CM | POA: Diagnosis not present

## 2020-11-09 ENCOUNTER — Encounter: Payer: Self-pay | Admitting: *Deleted

## 2020-11-15 ENCOUNTER — Ambulatory Visit (INDEPENDENT_AMBULATORY_CARE_PROVIDER_SITE_OTHER): Payer: Medicare Other | Admitting: Internal Medicine

## 2020-11-15 ENCOUNTER — Other Ambulatory Visit: Payer: Self-pay

## 2020-11-15 ENCOUNTER — Encounter: Payer: Self-pay | Admitting: Internal Medicine

## 2020-11-15 DIAGNOSIS — Z86010 Personal history of colonic polyps: Secondary | ICD-10-CM | POA: Diagnosis not present

## 2020-11-15 DIAGNOSIS — J9611 Chronic respiratory failure with hypoxia: Secondary | ICD-10-CM

## 2020-11-15 DIAGNOSIS — G4700 Insomnia, unspecified: Secondary | ICD-10-CM | POA: Diagnosis not present

## 2020-11-15 DIAGNOSIS — J449 Chronic obstructive pulmonary disease, unspecified: Secondary | ICD-10-CM | POA: Diagnosis not present

## 2020-11-15 DIAGNOSIS — Z9981 Dependence on supplemental oxygen: Secondary | ICD-10-CM

## 2020-11-15 NOTE — Assessment & Plan Note (Signed)
Patient complains of recurring insomnia and resultant daytime somnolence. He states that he has trouble sleeping at night due to his oxygen requirements, and that sleep gotten isn't deep sleep. He often naps during the day as a result of poor nighttime sleep.  - Advised patient to try over the counter melatonin, beginning with 1 mg and with titration up to 5 mg if needed.

## 2020-11-15 NOTE — Patient Instructions (Signed)
Thank you for visiting the [facility] today. It was a pleasure to meet you!  Today we discussed your overall health, giving specific attention to concerns of increased appetite, insomnia and daytime fatigue, and colon cancer screening.  As we discussed, the prednisone that you are taking is causing your appetite to increase. This is perfectly fine as I would rather you feel hungry and eat than have no appetite and struggle to eat.  For your insomnia, I have recommended trying melatonin. This is a supplement that can be purchased over the counter. I recommend trying 1 mg, and titrating up to 5 mg if you experience no relief of your insomnia symptoms on the lower dose. It is unlikely that a dose over 5 mg will be helpful.  We discussed recommendations made in 2014 after you had a colonoscopy to have your next screening colonoscopy 3 years later. You explained that there was a concern regarding your oxygen needs for the procedure, and that you have since tried to do home screening in the time since. As we talked about, I would be more than happy to place an order for a home screening tool for you if you decide that it is something you are interested in!  I would like to have you follow up with Dr. Court Joy in 5-6 months for routine follow-up.   Please do not hesitate to reach out to the clinic with questions or concerns! Dr. [name]

## 2020-11-15 NOTE — Assessment & Plan Note (Signed)
Patient currently managed by [name]. Continue management per pulmonology recommendations: - prednisone 10 mg daily - accuneb nebulizer, 1 ampule every 6 hours - ventolin inhaler, 2 puffs every 4 hours as needed for wheezing or shortness of breath - trelegy ellipta inhaler, 1 puff daily - flonase nasal spray, 1 spray daily - home oxygen, 3-4 L/min based on activity level

## 2020-11-15 NOTE — Assessment & Plan Note (Addendum)
Colonoscopy last completed in 2014. Multiple polyps found at this time, patient recommended to have repeat colonoscopy 3 years later but he states that due to COPD and oxygen requirements, the decision was made not to undergo colonoscopy. He states that two attempts at home screening tests have been made but unsuccessful. I explained my concern of not having any screening due to the possibility of new polyp development and the risk of colon cancer, which the patient understood. I offered to order a home screening tool if he decides that he is interested in trying this again and he verbalized understanding.

## 2020-11-15 NOTE — Progress Notes (Signed)
Patient ID: Frank [name], male   DOB: [birth year], 65 y.o.   MRN: [id number]   CC: check-up with primary care  HPI:  Frank [name] is a 65 y.o. male with a past medical history of COPD with respiratory failure requiring home oxygen, colonic polyps, and physical deconditioning who presents today for a well visit.   He is currently followed by [name] with pulmonology for his COPD and respiratory failure and has no complaints that are more significant than his baseline cough, occasional sputum production, wheezing, shortness of breath, and chest discomfort. He has not had any increased home oxygen needs. He does state that since beginning prednisone that his appetite has drastically increased.   On questioning Frank [name] states that he is able to take care of most of his daily needs including some vacuuming, cooking, dishes, bathing himself, and toileting himself. He does state that he receives help for things such as laundry as the laundry facility is downstairs from him. He denies any changes in memory, brain fog, new weakness.  Frank [name] does express concern over insomnia and subsequent daytime fatigue. He states that he feels wide awake late at night, can't fall asleep until early morning hours, wakes up around 7-8 a.m. and then requires naps throughout the day due to his lack of sleep. He states that his oxygen requirements don't help with this problem, and that he sleeps either on a sleeper recliner or in his hospital bed--whichever provides most comfort. He has not tried any medication for sleep.   PHQ-9 today was 6. Patient attributes positive answers to being related to his insomnia and daytime somnolence as well as physical limitations due to oxygen requirements.  Past Medical History:  Diagnosis Date   COPD (chronic obstructive pulmonary disease) ([place]) 06/05/2012   Dyspnea    Dysrhythmia    Emphysema 06/07/2012   Heart murmur    hx of as baby   History of kidney stones     Hypoxemia 06/07/2012   Obstructive chronic bronchitis with exacerbation ([employer]) 06/07/2012   Pneumonia    Tobacco abuse 06/05/2012   Review of Systems:  Review of Systems  Constitutional:  Positive for malaise/fatigue. Negative for weight loss.  HENT:  Negative for ear pain, hearing loss and sore throat.   Eyes:  Negative for blurred vision and double vision.  Respiratory:  Positive for cough, sputum production, shortness of breath (No changes in oxygen requirement) and wheezing.   Cardiovascular:  Positive for orthopnea. Negative for chest pain and leg swelling.  Gastrointestinal:  Negative for abdominal pain, constipation and diarrhea.  Genitourinary:  Negative for frequency and urgency.  Musculoskeletal:  Negative for myalgias.  Neurological:  Negative for dizziness, sensory change, loss of consciousness, weakness and headaches.  Psychiatric/Behavioral:  Negative for depression and memory loss. The patient has insomnia.     Physical Exam:  Vitals:   11/15/20 1314  BP: 137/82  Pulse: 81  Resp: (!) 36  Temp: 98.4 F (36.9 C)  TempSrc: Oral  SpO2: 100%  Weight: 154 lb 3.2 oz (69.9 kg)  Height: 5\' 6"  (1.676 m)   Physical Exam Constitutional:      Appearance: Normal appearance.     Interventions: Nasal cannula in place.  HENT:     Head: Normocephalic.     Right Ear: Hearing, tympanic membrane, ear canal and external ear normal.     Left Ear: Hearing, tympanic membrane, ear canal and external ear normal.     Nose: Nose normal.  Mouth/Throat:     Lips: Pink.     Mouth: Mucous membranes are moist.     Tongue: No lesions. Tongue does not deviate from midline.  Eyes:     General: Lids are normal. Gaze aligned appropriately.     Extraocular Movements: Extraocular movements intact.     Conjunctiva/sclera: Conjunctivae normal.     Pupils: Pupils are equal, round, and reactive to light.  Cardiovascular:     Rate and Rhythm: Normal rate and regular rhythm.     Pulses:           Radial pulses are 2+ on the right side and 2+ on the left side.       Posterior tibial pulses are 2+ on the right side and 2+ on the left side.     Heart sounds: Normal heart sounds.  Pulmonary:     Effort: Tachypnea present.     Breath sounds: Examination of the right-upper field reveals wheezing. Examination of the left-upper field reveals wheezing. Examination of the right-middle field reveals wheezing. Examination of the left-middle field reveals wheezing. Examination of the right-lower field reveals wheezing. Examination of the left-lower field reveals wheezing. Wheezing present.  Abdominal:     General: There is no distension.     Palpations: Abdomen is soft.     Tenderness: There is no abdominal tenderness.  Musculoskeletal:     Right lower leg: No edema.     Left lower leg: No edema.  Lymphadenopathy:     Cervical: No cervical adenopathy.  Skin:    General: Skin is warm and dry.  Neurological:     Mental Status: He is alert.     Sensory: Sensation is intact.     Motor: Motor function is intact.  Psychiatric:        Mood and Affect: Mood and affect normal.        Behavior: Behavior is cooperative.        Judgment: Judgment normal.     Assessment & Plan:   See Encounters Tab for problem based charting.  Patient seen with Dr. Jimmye Norman

## 2020-11-15 NOTE — Assessment & Plan Note (Deleted)
Return for follow-up with Dr. Court Joy in 5 months.

## 2020-11-23 NOTE — Progress Notes (Signed)
Internal Medicine Clinic Attending  I saw and evaluated the patient.  I personally confirmed the key portions of the history and exam documented by Dr.  Dean  and I reviewed pertinent patient test results.  The assessment, diagnosis, and plan were formulated together and I agree with the documentation in the resident's note.  

## 2020-11-26 ENCOUNTER — Ambulatory Visit: Payer: Medicare Other | Admitting: Internal Medicine

## 2020-11-26 ENCOUNTER — Ambulatory Visit: Payer: Medicare Other | Admitting: Pharmacist

## 2020-11-29 ENCOUNTER — Other Ambulatory Visit: Payer: Self-pay | Admitting: Emergency Medicine

## 2020-12-02 ENCOUNTER — Telehealth: Payer: Self-pay | Admitting: Emergency Medicine

## 2020-12-02 NOTE — Telephone Encounter (Signed)
Called and spoke with patient who is calling because he has form that needs to be filled out. Form is for his garbage can to be picked up from his home by the garbage company since he is unable to take it to the street. Patient states he is going to mail it to the office and also enclose an envelope w/stamp so that once it is filled out we can mail it back to the garbage company for him. Also please make a copy and scan into patients chat incase it gets lost in mail. Will route to [facility] Dr. Sudie Bailey nurse to be on the look out for form.

## 2020-12-04 DIAGNOSIS — J449 Chronic obstructive pulmonary disease, unspecified: Secondary | ICD-10-CM | POA: Diagnosis not present

## 2020-12-04 DIAGNOSIS — U071 COVID-19: Secondary | ICD-10-CM | POA: Diagnosis not present

## 2020-12-05 DIAGNOSIS — J449 Chronic obstructive pulmonary disease, unspecified: Secondary | ICD-10-CM | POA: Diagnosis not present

## 2020-12-05 DIAGNOSIS — Z9981 Dependence on supplemental oxygen: Secondary | ICD-10-CM | POA: Diagnosis not present

## 2020-12-16 ENCOUNTER — Other Ambulatory Visit: Payer: Self-pay

## 2020-12-16 ENCOUNTER — Ambulatory Visit (INDEPENDENT_AMBULATORY_CARE_PROVIDER_SITE_OTHER): Payer: Medicare Other | Admitting: Emergency Medicine

## 2020-12-16 ENCOUNTER — Encounter: Payer: Self-pay | Admitting: Emergency Medicine

## 2020-12-16 DIAGNOSIS — Z9981 Dependence on supplemental oxygen: Secondary | ICD-10-CM | POA: Diagnosis not present

## 2020-12-16 DIAGNOSIS — J449 Chronic obstructive pulmonary disease, unspecified: Secondary | ICD-10-CM | POA: Diagnosis not present

## 2020-12-16 DIAGNOSIS — J9611 Chronic respiratory failure with hypoxia: Secondary | ICD-10-CM | POA: Diagnosis not present

## 2020-12-16 NOTE — Telephone Encounter (Signed)
Spoke with pt who stated he has not placed letter in mail yet. Pt states he will mail letter off tomorrow. Will close this encounter and create new one when letter arrives is office. Nothing further needed at this time.

## 2020-12-16 NOTE — Assessment & Plan Note (Signed)
Severe COPD.  Overall stable.  He is having some urinary hesitancy, has BPH on his problem list.  He is on LAMA which can exacerbate this.  Has been tolerating LAMA for years so I am hesitant to make a change right now especially given how tenuous his respiratory status can be.  We will follow-up based on symptoms and decide whether he needs to be changed to an alternative LABA/ ICS.  Plan to continue Trelegy for now.  Albuterol as needed.  We discussed COVID vaccination.  I told him to get the booster in the fall when the new version is available that addresses the most frequent active strains.

## 2020-12-16 NOTE — Assessment & Plan Note (Signed)
Currently on 4 L/min and tolerating.

## 2020-12-16 NOTE — Progress Notes (Signed)
Virtual Visit via Telephone Note  I connected with Frank [name] on 12/16/20 at 11:45 AM EDT by telephone and verified that I am speaking with the correct person using two identifiers.  Location: Patient: Home Provider: Office   I discussed the limitations, risks, security and privacy concerns of performing an evaluation and management service by telephone and the availability of in person appointments. I also discussed with the patient that there may be a patient responsible charge related to this service. The patient expressed understanding and agreed to proceed.   History of Present Illness: Frank [name] is 64 with severe COPD, chronic hypoxemic respiratory failure.  He had COVID-19 in October 2021.  He uses oxygen at 4 L/min, managed on Trelegy, prednisone 10 mg once daily.     Observations/Objective: He says that he is feeling fairly well. He is having some urinary hesitancy, inability to urinate.  He has had some flaring sx, but has not required extra pred or abx. Feels at his usual baseline. Reliable w his O2.  Minimal cough, sputum.  Occasional wheeze. He had the first 2 COVID vaccines  Assessment and Plan: COPD (chronic obstructive pulmonary disease) (HCC) Severe COPD.  Overall stable.  He is having some urinary hesitancy, has BPH on his problem list.  He is on LAMA which can exacerbate this.  Has been tolerating LAMA for years so I am hesitant to make a change right now especially given how tenuous his respiratory status can be.  We will follow-up based on symptoms and decide whether he needs to be changed to an alternative LABA/ ICS.  Plan to continue Trelegy for now.  Albuterol as needed.  We discussed COVID vaccination.  I told him to get the booster in the fall when the new version is available that addresses the most frequent active strains.  Chronic respiratory failure with hypoxia, on home O2 therapy (HCC) Currently on 4 L/min and tolerating.   Follow Up Instructions: 3  months phone visit   I discussed the assessment and treatment plan with the patient. The patient was provided an opportunity to ask questions and all were answered. The patient agreed with the plan and demonstrated an understanding of the instructions.   The patient was advised to call back or seek an in-person evaluation if the symptoms worsen or if the condition fails to improve as anticipated.  I provided 18 minutes of non-face-to-face time during this encounter.   Collene Gobble, MD

## 2020-12-20 ENCOUNTER — Encounter: Payer: Self-pay | Admitting: Pharmacist

## 2020-12-20 ENCOUNTER — Ambulatory Visit (INDEPENDENT_AMBULATORY_CARE_PROVIDER_SITE_OTHER): Payer: Medicare Other | Admitting: Pharmacist

## 2020-12-20 VITALS — BP 124/77 | Wt 153.6 lb

## 2020-12-20 DIAGNOSIS — Z0000 Encounter for general adult medical examination without abnormal findings: Secondary | ICD-10-CM

## 2020-12-20 NOTE — Patient Instructions (Addendum)
Things That May Be Affecting Your Health:   Alcohol   Hearing loss   Pain     Depression   Home Safety   Sexual Health    Diabetes + Lack of physical activity   Stress    Difficulty with daily activities   Loneliness   Tiredness    Drug use   Medicines   Tobacco use    Falls   Motor Vehicle Safety   Weight    Food choices   Oral Health   Other      YOUR PERSONALIZED HEALTH PLAN : 1. Schedule your next subsequent Medicare Wellness visit in one year 2. Attend all of your regular appointments to address your medical issues 3. Complete the preventative screenings and services     Annual Wellness Visit                       Medicare Covered Preventative Screenings and [place] Men and Women Who How Often Need? Date of Last Service Action  Abdominal Aortic Aneurysm Adults with AAA risk factors Once + CT abdomen 06/2016     Alcohol Misuse and Counseling All Adults Screening once a year if no alcohol misuse. Counseling up to 4 face to face sessions.        Bone Density Measurement  Adults at risk for osteoporosis Once every 2 yrs        Lipid Panel Z13.6 All adults without CV disease Once every 5 yrs +   2016      Colorectal Cancer  Stool sample or Colonoscopy All adults 39 and older   Once every year Every 10 years +     FOBT 2016      Depression All Adults Once a year        Diabetes Screening Blood glucose, post glucose load, or GTT Z13.1 All adults at risk Pre-diabetics Once per year Twice per year          Diabetes  Self-Management Training All adults Diabetics 10 hrs first year; 2 hours subsequent years. Requires Copay        Glaucoma Diabetics Family history of glaucoma African Americans 53 yrs + Hispanic Americans 52 yrs + Annually - requires coppay          Hepatitis C Z72.89 or F19.20 High Risk for HCV Born between 1945 and 1965 Annually Once          HIV Z11.4 All adults based on risk Annually btw ages 12 & 46 regardless of  risk Annually > 65 yrs if at increased risk          Lung Cancer Screening Asymptomatic adults aged 62-77 with 30 pack yr history and current smoker OR quit within the last 15 yrs Annually Must have counseling and shared decision making documentation before first screen          Medical Nutrition Therapy Adults with  Diabetes Renal disease Kidney transplant within past 3 yrs 3 hours first year; 2 hours subsequent years        Obesity and Counseling All adults Screening once a year Counseling if BMI 30 or higher   Today    Tobacco Use Counseling Adults who use tobacco  Up to 8 visits in one year        Vaccines Z23 Hepatitis B Influenza  Pneumonia  Adults   Once Once every flu season Two different vaccines separated by one year  Next Annual Wellness Visit People with Medicare Every year   Today        [place] Women Who How Often Need  Date of Last Service Action  Mammogram  Z12.31 Women over 32 One baseline ages 72-39. Annually ager 40 yrs+          Pap tests All women Annually if high risk. Every 2 yrs for normal risk women          Screening for cervical cancer with  Pap (Z01.419 nl or Z01.411abnl) & HPV Z11.51 Women aged 64 to 91 Once every 5 yrs        Screening pelvic and breast exams All women Annually if high risk. Every 2 yrs for normal risk women        Sexually Transmitted Diseases Chlamydia Gonorrhea Syphilis All at risk adults Annually for non pregnant females at increased risk                [place] Men Who How Ofter Need  Date of Last Service Action  Prostate Cancer - DRE & PSA Men over 50 Annually.  DRE might require a copay. +            Sexually Transmitted Diseases Syphilis All at risk adults Annually for men at increased risk          Health Maintenance List     Health Maintenance  Topic Date Due  . COLON CANCER SCREENING ANNUAL FOBT  06/19/2015  . COLONOSCOPY (Pts 45-35yr Insurance coverage will need to  be confirmed)  10/10/2015  . COVID-19 Vaccine (3 - Booster for Pfizer series) 02/23/2020  . INFLUENZA VACCINE  12/06/2020  . TETANUS/TDAP  06/18/2022  . Hepatitis C Screening  Completed  . PNA vac Low Risk Adult  Completed  . HPV VACCINES  Aged Out   Fall Prevention in the Home, Adult Falls can cause injuries and can happen to people of all ages. There are many things you can do to make your home safe and to help prevent falls. Ask forhelp when making these changes. What actions can I take to prevent falls? General Instructions Use good lighting in all rooms. Replace any light bulbs that burn out. Turn on the lights in dark areas. Use night-lights. Keep items that you use often in easy-to-reach places. Lower the shelves around your home if needed. Set up your furniture so you have a clear path. Avoid moving your furniture around. Do not have throw rugs or other things on the floor that can make you trip. Avoid walking on wet floors. If any of your floors are uneven, fix them. Add color or contrast paint or tape to clearly mark and help you see: Grab bars or handrails. First and last steps of staircases. Where the edge of each step is. If you use a stepladder: Make sure that it is fully opened. Do not climb a closed stepladder. Make sure the sides of the stepladder are locked in place. Ask someone to hold the stepladder while you use it. Know where your pets are when moving through your home. What can I do in the bathroom?     Keep the floor dry. Clean up any water on the floor right away. Remove soap buildup in the tub or shower. Use nonskid mats or decals on the floor of the tub or shower. Attach bath mats securely with double-sided, nonslip rug tape. If you need to sit down in the shower, use a plastic,  nonslip stool. Install grab bars by the toilet and in the tub and shower. Do not use towel bars as grab bars. What can I do in the bedroom? Make sure that you have a light by  your bed that is easy to reach. Do not use any sheets or blankets for your bed that hang to the floor. Have a firm chair with side arms that you can use for support when you get dressed. What can I do in the kitchen? Clean up any spills right away. If you need to reach something above you, use a step stool with a grab bar. Keep electrical cords out of the way. Do not use floor polish or wax that makes floors slippery. What can I do with my stairs? Do not leave any items on the stairs. Make sure that you have a light switch at the top and the bottom of the stairs. Make sure that there are handrails on both sides of the stairs. Fix handrails that are broken or loose. Install nonslip stair treads on all your stairs. Avoid having throw rugs at the top or bottom of the stairs. Choose a carpet that does not hide the edge of the steps on the stairs. Check carpeting to make sure that it is firmly attached to the stairs. Fix carpet that is loose or worn. What can I do on the outside of my home? Use bright outdoor lighting. Fix the edges of walkways and driveways and fix any cracks. Remove anything that might make you trip as you walk through a door, such as a raised step or threshold. Trim any bushes or trees on paths to your home. Check to see if handrails are loose or broken and that both sides of all steps have handrails. Install guardrails along the edges of any raised decks and porches. Clear paths of anything that can make you trip, such as tools or rocks. Have leaves, snow, or ice cleared regularly. Use sand or salt on paths during winter. Clean up any spills in your garage right away. This includes grease or oil spills. What other actions can I take? Wear shoes that: Have a low heel. Do not wear high heels. Have rubber bottoms. Feel good on your feet and fit well. Are closed at the toe. Do not wear open-toe sandals. Use tools that help you move around if needed. These  include: Canes. Walkers. Scooters. Crutches. Review your medicines with your doctor. Some medicines can make you feel dizzy. This can increase your chance of falling. Ask your doctor what else you can do to help prevent falls. Where to find more information Centers for Disease Control and Prevention, STEADI: http://www.wolf.info/ National Institute on Aging: http://kim-miller.com/ Contact a doctor if: You are afraid of falling at home. You feel weak, drowsy, or dizzy at home. You fall at home. Summary There are many simple things that you can do to make your home safe and to help prevent falls. Ways to make your home safe include removing things that can make you trip and installing grab bars in the bathroom. Ask for help when making these changes in your home. This information is not intended to replace advice given to you by your health care provider. Make sure you discuss any questions you have with your healthcare provider. Document Revised: 11/26/2019 Document Reviewed: 11/26/2019 Elsevier Patient Education  [place] Maintenance, Male Adopting a healthy lifestyle and getting preventive care are important in promoting health and wellness. Ask your health  care provider about: The right schedule for you to have regular tests and exams. Things you can do on your own to prevent diseases and keep yourself healthy. What should I know about diet, weight, and exercise? Eat a healthy diet  Eat a diet that includes plenty of vegetables, fruits, low-fat dairy products, and lean protein. Do not eat a lot of foods that are high in solid fats, added sugars, or sodium.  Maintain a healthy weight Body mass index (BMI) is a measurement that can be used to identify possible weight problems. It estimates body fat based on height and weight. Your health care provider can help determine your BMI and help you achieve or maintain ahealthy weight. Get regular exercise Get regular exercise. This is  one of the most important things you can do for your health. Most adults should: Exercise for at least 150 minutes each week. The exercise should increase your heart rate and make you sweat (moderate-intensity exercise). Do strengthening exercises at least twice a week. This is in addition to the moderate-intensity exercise. Spend less time sitting. Even light physical activity can be beneficial. Watch cholesterol and blood lipids Have your blood tested for lipids and cholesterol at 66 years of age, then havethis test every 5 years. You may need to have your cholesterol levels checked more often if: Your lipid or cholesterol levels are high. You are older than 67 years of age. You are at high risk for heart disease. What should I know about cancer screening? Many types of cancers can be detected early and may often be prevented. Depending on your health history and family history, you may need to have cancer screening at various ages. This may include screening for: Colorectal cancer. Prostate cancer. Skin cancer. Lung cancer. What should I know about heart disease, diabetes, and high blood pressure? Blood pressure and heart disease High blood pressure causes heart disease and increases the risk of stroke. This is more likely to develop in people who have high blood pressure readings, are of African descent, or are overweight. Talk with your health care provider about your target blood pressure readings. Have your blood pressure checked: Every 3-5 years if you are 3-23 years of age. Every year if you are 22 years old or older. If you are between the ages of 80 and 31 and are a current or former smoker, ask your health care provider if you should have a one-time screening for abdominal aortic aneurysm (AAA). Diabetes Have regular diabetes screenings. This checks your fasting blood sugar level. Have the screening done: Once every three years after age 20 if you are at a normal weight and  have a low risk for diabetes. More often and at a younger age if you are overweight or have a high risk for diabetes. What should I know about preventing infection? Hepatitis B If you have a higher risk for hepatitis B, you should be screened for this virus. Talk with your health care provider to find out if you are at risk forhepatitis B infection. Hepatitis C Blood testing is recommended for: Everyone born from [birth year] through 1965. Anyone with known risk factors for hepatitis C. Sexually transmitted infections (STIs) You should be screened each year for STIs, including gonorrhea and chlamydia, if: You are sexually active and are younger than 67 years of age. You are older than 67 years of age and your health care provider tells you that you are at risk for this type of infection. Your sexual activity  has changed since you were last screened, and you are at increased risk for chlamydia or gonorrhea. Ask your health care provider if you are at risk. Ask your health care provider about whether you are at high risk for HIV. Your health care provider may recommend a prescription medicine to help prevent HIV infection. If you choose to take medicine to prevent HIV, you should first get tested for HIV. You should then be tested every 3 months for as long as you are taking the medicine. Follow these instructions at home: Lifestyle Do not use any products that contain nicotine or tobacco, such as cigarettes, e-cigarettes, and chewing tobacco. If you need help quitting, ask your health care provider. Do not use street drugs. Do not share needles. Ask your health care provider for help if you need support or information about quitting drugs. Alcohol use Do not drink alcohol if your health care provider tells you not to drink. If you drink alcohol: Limit how much you have to 0-2 drinks a day. Be aware of how much alcohol is in your drink. In the [place] one drink equals one 12 oz bottle of beer (355 mL),  one 5 oz glass of wine (148 mL), or one 1 oz glass of hard liquor (44 mL). General instructions Schedule regular health, dental, and eye exams. Stay current with your vaccines. Tell your health care provider if: You often feel depressed. You have ever been abused or do not feel safe at home. Summary Adopting a healthy lifestyle and getting preventive care are important in promoting health and wellness. Follow your health care provider's instructions about healthy diet, exercising, and getting tested or screened for diseases. Follow your health care provider's instructions on monitoring your cholesterol and blood pressure. This information is not intended to replace advice given to you by your health care provider. Make sure you discuss any questions you have with your healthcare provider. Document Revised: 04/17/2018 Document Reviewed: 04/17/2018 Elsevier Patient Education  2022 [employer].

## 2020-12-20 NOTE — Progress Notes (Signed)
This AWV is being conducted by [facility] only. The patient was located at home and I was located in [facility]. The patient's identity was confirmed using their DOB and current address. The patient or his/her legal guardian has consented to being evaluated through a telephone encounter and understands the associated risks (an examination cannot be done and the patient may need to come in for an appointment) / benefits (allows the patient to remain at home, decreasing exposure to coronavirus). I personally spent 24 minutes conducting the AWV.  Subjective:   Frank [name] is a 65 y.o. male who presents for a Medicare Annual Wellness Visit.  The following items have been reviewed and updated today in the appropriate area in the EMR.   Health Risk Assessment  Height, weight, BMI, and BP Visual acuity if needed Depression screen Fall risk / safety level Advance directive discussion Medical and family history were reviewed and updated Updating list of other providers & suppliers Medication reconciliation, including over the counter medicines Cognitive screen Written screening schedule Risk Factor list Personalized health advice, risky behaviors, and treatment advice  Social History   Social History Narrative   Current Social History 12/20/2020        Patient lives alone in a home which is 2 stories. There is one step up to the entrance the patient uses.       Patient's method of transportation is via family member (sister)      The highest level of education was some high school.      The patient currently retired.      Identified important Relationships are "my sisters"      Pets : 0       Interests / Fun: "Play games and watch TV"       Current Stressors: "none"       Religious / Personal Beliefs: Baptist     Cardiac Risk Factors include: advanced age (>59mn, >>47women);sedentary lifestyle;male gender    Objective:    Vitals: BP 124/77   Wt 153 lb 9.6 oz (69.7 kg)    BMI 24.79 kg/m  Vitals are patient reported  Activities of Daily Living In your present state of health, do you have any difficulty performing the following activities: 12/20/2020 11/15/2020  Hearing? N N  Vision? Y Y  Comment - blurry  Difficulty concentrating or making decisions? N N  Walking or climbing stairs? Y Y  Comment - shortness of breath  Dressing or bathing? N N  Doing errands, [place]? [place] ? Y -  Using the Toilet? N -  In the past six months, have you accidently leaked urine? N -  Do you have problems with loss of bowel control? N -  Managing your Medications? N -  Managing your Finances? N -  Housekeeping or managing your Housekeeping? N -  Some recent data might be hidden    Goals  Goals      Exercise 3x per week (30 min per time)     Get out of the house and go walking     Quit smoking / using tobacco        Fall Risk Fall Risk  12/20/2020 11/15/2020 10/23/2016 06/09/2016 08/18/2014  Falls in the past year? 0 1 No No No  Number falls in past yr: 0 1 - - -  Injury with Fall? 0 - - - -  Risk for fall due to :  No Fall Risks Impaired balance/gait - - -  Follow up Falls evaluation completed;Falls prevention discussed Falls prevention discussed - - -    Depression Screen PHQ 2/9 Scores 12/20/2020 11/15/2020 07/08/2019 10/23/2016  PHQ - 2 Score '1 3 1 '$ 0  PHQ- 9 Score - 6 7 -     Cognitive Testing Six-Item Cognitive Screener   "I would like to ask you some questions that ask you to use your memory. I am going to name three objects. Please wait until I say all three words, then repeat them. Remember what they are  because I am going to ask you to name them again in a few minutes. Please repeat these words for me: APPLE--TABLE--PENNY." (Interviewer may repeat names 3 times if necessary but repetition not scored.)  Did patient correctly repeat all three words? Yes - may proceed with screen  What year is this? Correct What  month is this? Correct What day of the week is this? Correct  What were the three objects I asked you to remember? Apple Correct Table Correct Penny Correct  Score one point for each incorrect answer.  A score of 2 or more points warrants additional investigation.  Patient's score 0     Assessment and Plan:    During the course of the visit the patient was educated and counseled about appropriate screening and preventive services as documented in the assessment and plan.  Patient unable to complete colonoscopy due to difficulty with breathing when laying flat.  Patient states he did receive his Shingrix vaccine. Will bring in documentation to add to system.  The printed AVS was given to the patient and included an updated screening schedule, a list of risk factors, and personalized health advice.        Hughes Better, RPH-CPP  12/20/2020

## 2020-12-28 ENCOUNTER — Other Ambulatory Visit: Payer: Self-pay | Admitting: Emergency Medicine

## 2021-01-05 DIAGNOSIS — J449 Chronic obstructive pulmonary disease, unspecified: Secondary | ICD-10-CM | POA: Diagnosis not present

## 2021-01-05 DIAGNOSIS — Z9981 Dependence on supplemental oxygen: Secondary | ICD-10-CM | POA: Diagnosis not present

## 2021-01-17 ENCOUNTER — Telehealth: Payer: Self-pay | Admitting: Internal Medicine

## 2021-01-17 NOTE — Telephone Encounter (Signed)
Opened in Error.

## 2021-01-17 NOTE — Progress Notes (Signed)
I discussed the AWV findings with the provider who conducted the visit. I was present in the office suite and immediately available to provide assistance and direction throughout the time the service was provided.  Mitzi Hansen, MD Internal Medicine Resident PGY-2 [facility] Pager: [phone] 01/17/2021 10:15 AM

## 2021-01-17 NOTE — Telephone Encounter (Deleted)
.  awv

## 2021-01-24 NOTE — Progress Notes (Signed)
Reviewed and agree.

## 2021-02-02 ENCOUNTER — Telehealth: Payer: Self-pay

## 2021-02-02 NOTE — Telephone Encounter (Signed)
Mailed signed paperwork city of [place] on pt's behalf. Made pt aware. Nothing further needed.

## 2021-02-04 DIAGNOSIS — J449 Chronic obstructive pulmonary disease, unspecified: Secondary | ICD-10-CM | POA: Diagnosis not present

## 2021-02-04 DIAGNOSIS — Z9981 Dependence on supplemental oxygen: Secondary | ICD-10-CM | POA: Diagnosis not present

## 2021-03-03 DIAGNOSIS — J449 Chronic obstructive pulmonary disease, unspecified: Secondary | ICD-10-CM | POA: Diagnosis not present

## 2021-03-07 DIAGNOSIS — Z9981 Dependence on supplemental oxygen: Secondary | ICD-10-CM | POA: Diagnosis not present

## 2021-03-07 DIAGNOSIS — J449 Chronic obstructive pulmonary disease, unspecified: Secondary | ICD-10-CM | POA: Diagnosis not present

## 2021-03-17 ENCOUNTER — Telehealth: Payer: Self-pay

## 2021-03-17 NOTE — Telephone Encounter (Signed)
Spoke will [name] (per [employer]) who mailed a request to the office requesting a letter to post office. The letter should request mail be delivered to porch instead of mailbox, r/t pt having a hard time ambulating to mailbox. Letter was typed and signed by [name] and letter placed in outgoing mail for [place]. Nothing further needed at this time.

## 2021-04-03 DIAGNOSIS — J449 Chronic obstructive pulmonary disease, unspecified: Secondary | ICD-10-CM | POA: Diagnosis not present

## 2021-04-06 DIAGNOSIS — J449 Chronic obstructive pulmonary disease, unspecified: Secondary | ICD-10-CM | POA: Diagnosis not present

## 2021-04-06 DIAGNOSIS — Z9981 Dependence on supplemental oxygen: Secondary | ICD-10-CM | POA: Diagnosis not present

## 2021-05-03 DIAGNOSIS — J449 Chronic obstructive pulmonary disease, unspecified: Secondary | ICD-10-CM | POA: Diagnosis not present

## 2021-05-07 DIAGNOSIS — J449 Chronic obstructive pulmonary disease, unspecified: Secondary | ICD-10-CM | POA: Diagnosis not present

## 2021-05-07 DIAGNOSIS — Z9981 Dependence on supplemental oxygen: Secondary | ICD-10-CM | POA: Diagnosis not present

## 2021-05-17 ENCOUNTER — Telehealth: Payer: Self-pay | Admitting: *Deleted

## 2021-05-17 NOTE — Telephone Encounter (Signed)
Call from Will Rogers,CM with [facility] in [facility]. States pt has an "urinary stream weakness; possible an enlarged prostate"; requesting medication. I asked if pt is able to come in for an appt to discuss the problem - his sister who's at the home stated "yes". And she will call back to schedule an appt.

## 2021-05-17 NOTE — Telephone Encounter (Signed)
Agree, thank you

## 2021-05-25 ENCOUNTER — Other Ambulatory Visit: Payer: Self-pay | Admitting: Emergency Medicine

## 2021-05-26 ENCOUNTER — Other Ambulatory Visit: Payer: Self-pay | Admitting: Emergency Medicine

## 2021-06-07 DIAGNOSIS — J449 Chronic obstructive pulmonary disease, unspecified: Secondary | ICD-10-CM | POA: Diagnosis not present

## 2021-06-07 DIAGNOSIS — Z9981 Dependence on supplemental oxygen: Secondary | ICD-10-CM | POA: Diagnosis not present

## 2021-06-30 ENCOUNTER — Encounter: Payer: Self-pay | Admitting: Internal Medicine

## 2021-06-30 ENCOUNTER — Other Ambulatory Visit: Payer: Self-pay | Admitting: Internal Medicine

## 2021-06-30 ENCOUNTER — Ambulatory Visit (INDEPENDENT_AMBULATORY_CARE_PROVIDER_SITE_OTHER): Payer: Medicare Other | Admitting: Internal Medicine

## 2021-06-30 VITALS — BP 136/74 | HR 79 | Temp 98.2°F | Ht 66.0 in | Wt 147.4 lb

## 2021-06-30 DIAGNOSIS — R3916 Straining to void: Secondary | ICD-10-CM | POA: Diagnosis not present

## 2021-06-30 DIAGNOSIS — Z7952 Long term (current) use of systemic steroids: Secondary | ICD-10-CM | POA: Diagnosis not present

## 2021-06-30 DIAGNOSIS — J9611 Chronic respiratory failure with hypoxia: Secondary | ICD-10-CM

## 2021-06-30 DIAGNOSIS — N401 Enlarged prostate with lower urinary tract symptoms: Secondary | ICD-10-CM

## 2021-06-30 DIAGNOSIS — Z86010 Personal history of colonic polyps: Secondary | ICD-10-CM

## 2021-06-30 DIAGNOSIS — R972 Elevated prostate specific antigen [PSA]: Secondary | ICD-10-CM

## 2021-06-30 DIAGNOSIS — Z9981 Dependence on supplemental oxygen: Secondary | ICD-10-CM

## 2021-06-30 LAB — POCT GLYCOSYLATED HEMOGLOBIN (HGB A1C): Hemoglobin A1C: 5.6 % (ref 4.0–5.6)

## 2021-06-30 LAB — GLUCOSE, CAPILLARY: Glucose-Capillary: 118 mg/dL — ABNORMAL HIGH (ref 70–99)

## 2021-06-30 MED ORDER — TAMSULOSIN HCL 0.4 MG PO CAPS: 0.4000 mg | ORAL_CAPSULE | Freq: Every day | ORAL | 1 refills | Status: DC

## 2021-06-30 NOTE — Assessment & Plan Note (Addendum)
Patient with history of BPH not on medications who presents to the [facility] today for 1 month of difficulties urinating. He states that he feels as if he cannot completely empty his bladder, he is urinating frequently, his stream stops/starts intermittently and he generally has a weak urine stream. He denies any dysuria, hematuria, or flank plan. AUA score of 16 (moderate symptom burden). On exam, the patient has an enlarged prostate palpated, with no nodularities or irregularities appreciated.   Plan: - PSA today, may need urology referral based on results - BMP today  - Start flomax 0.4 mg daily (counseled on symptomatic orthostatic hypotension side effect)  Addendum: PSA significantly elevated to 348. Called and discussed this with the patient and that there is a possibility that he could have prostate cancer, although we cannot know for sure without a biopsy. Placed referral to urology. Patient understands and agrees with plan.

## 2021-06-30 NOTE — Patient Instructions (Signed)
Thank you, Mr.Mischa D Azevedo for allowing [place] to provide your care today. Today we discussed:  Urinary symptoms and weak urinary stream: Start flomax 0.4 mg daily. We are also checking your kidney function and prostate today.   COPD: Since you are on steroids, we are going to check your A1c (a test for diabetes) today  I have ordered the following labs for you:  Lab Orders         PSA         BMP8+Anion Gap         POC Hbg A1C      Tests ordered today:  none  Referrals ordered today:   Referral Orders  No referral(s) requested today     I have ordered the following medication/changed the following medications:   Stop the following medications: There are no discontinued medications.   Start the following medications: Meds ordered this encounter  Medications   tamsulosin (FLOMAX) 0.4 MG CAPS capsule    Sig: Take 1 capsule (0.4 mg total) by mouth daily.    Dispense:  30 capsule    Refill:  1     Follow up: 2-3 months   Should you have any questions or concerns please call the internal medicine clinic at [phone].     Buddy Duty, D.O. [place]

## 2021-06-30 NOTE — Assessment & Plan Note (Addendum)
Patient is on chronic systemic steroid therapy for his COPD. Will check an A1c today- resulted at 5.6.

## 2021-06-30 NOTE — Progress Notes (Signed)
° °  CC: difficulty urinating  HPI:  Frank [name] is a 66 y.o. male with COPD and BPH not on any medications who presents to the [facility] for difficulty urinating x 1 month. Please see problem-based list for further details, assessments, and plans.   Past Medical History:  Diagnosis Date   COPD (chronic obstructive pulmonary disease) ([place]) 06/05/2012   Dyspnea    Dysrhythmia    Emphysema 06/07/2012   Heart murmur    hx of as baby   History of kidney stones    Hypoxemia 06/07/2012   Obstructive chronic bronchitis with exacerbation ([place]) 06/07/2012   Pneumonia    Tobacco abuse 06/05/2012   Review of Systems:  Review of Systems  Constitutional:  Negative for chills and fever.  Respiratory: Negative.    Cardiovascular: Negative.   Gastrointestinal:  Negative for diarrhea, nausea and vomiting.  Genitourinary:  Positive for frequency and urgency. Negative for dysuria, flank pain and hematuria.  Neurological:  Negative for dizziness, loss of consciousness and headaches.    Physical Exam:  Vitals:   06/30/21 1313  BP: 136/74  Pulse: 79  Temp: 98.2 F (36.8 C)  TempSrc: Oral  SpO2: 99%  Weight: 147 lb 6.4 oz (66.9 kg)  Height: 5\' 6"  (1.676 m)   General: Pleasant, chronically ill-appearing male. No acute distress. CV: RRR. No murmurs. No LE edema Pulmonary: Some bilateral expiratory wheezing. Abdominal: Soft, nontender, nondistended. Normal bowel sounds. GU: Prostate enlarged on DRE, no nodularities appreciated   Skin: Warm and dry.  Neuro: A&Ox3. No focal deficit. Psych: Normal mood and affect   Assessment & Plan:   See Encounters Tab for problem based charting.  Patient discussed with Dr. Evette Doffing

## 2021-07-01 LAB — BMP8+ANION GAP
Anion Gap: 16 mmol/L (ref 10.0–18.0)
BUN/Creatinine Ratio: 24 (ref 10–24)
BUN: 21 mg/dL (ref 8–27)
CO2: 33 mmol/L — ABNORMAL HIGH (ref 20–29)
Calcium: 10 mg/dL (ref 8.6–10.2)
Chloride: 94 mmol/L — ABNORMAL LOW (ref 96–106)
Creatinine, Ser: 0.86 mg/dL (ref 0.76–1.27)
Glucose: 110 mg/dL — ABNORMAL HIGH (ref 70–99)
Potassium: 4.8 mmol/L (ref 3.5–5.2)
Sodium: 143 mmol/L (ref 134–144)
eGFR: 95 mL/min/{1.73_m2} (ref >59)

## 2021-07-01 LAB — PSA: Prostate Specific Ag, Serum: 378 ng/mL — ABNORMAL HIGH (ref 0.0–4.0)

## 2021-07-01 NOTE — Addendum Note (Signed)
Addended by: Buddy Duty on: 07/01/2021 12:11 PM   Modules accepted: Orders

## 2021-07-01 NOTE — Progress Notes (Signed)
Internal Medicine Clinic Attending ° °Case discussed with Dr. Atway  At the time of the visit.  We reviewed the resident’s history and exam and pertinent patient test results.  I agree with the assessment, diagnosis, and plan of care documented in the resident’s note.  °

## 2021-07-01 NOTE — Addendum Note (Signed)
Addended by: Lalla Brothers T on: 07/01/2021 08:03 AM   Modules accepted: Level of Service

## 2021-07-05 DIAGNOSIS — J449 Chronic obstructive pulmonary disease, unspecified: Secondary | ICD-10-CM | POA: Diagnosis not present

## 2021-07-05 DIAGNOSIS — Z9981 Dependence on supplemental oxygen: Secondary | ICD-10-CM | POA: Diagnosis not present

## 2021-07-14 ENCOUNTER — Encounter: Payer: Self-pay | Admitting: Internal Medicine

## 2021-07-14 ENCOUNTER — Telehealth: Payer: Self-pay | Admitting: Emergency Medicine

## 2021-07-14 MED ORDER — PREDNISONE 10 MG PO TABS: ORAL_TABLET | ORAL | 0 refills | Status: DC

## 2021-07-14 NOTE — Telephone Encounter (Signed)
I called the home health nurse and he reports he is having more wheezing and he sounds like per Frank [name] that he is hearing more friction in his lungs. He was told about a cough but Frank [name] was not sure if it was productive. the patient has a follow up on 07/26/21.  There were no other symptoms noted on exam per Frank [name]. Please advise if he is ok to wait or does he need to be worked in for a sooner date? ?

## 2021-07-14 NOTE — Telephone Encounter (Signed)
We could try going up on his prednisone to treat a possible flare. If he is willing, give him pred as below.  ?Pred >> Take '40mg'$  daily for 3 days, then '30mg'$  daily for 3 days, then '20mg'$  daily for 3 days, then '10mg'$  daily as he has been doing at baseline ?

## 2021-07-14 NOTE — Telephone Encounter (Signed)
I called the patient and he is agreeable to the prednisone. I have placed the order and the patient did not have any questions.  ?

## 2021-07-18 ENCOUNTER — Other Ambulatory Visit: Payer: Self-pay | Admitting: Emergency Medicine

## 2021-07-25 ENCOUNTER — Encounter: Payer: Self-pay | Admitting: Internal Medicine

## 2021-07-26 ENCOUNTER — Encounter: Payer: Self-pay | Admitting: Emergency Medicine

## 2021-07-26 ENCOUNTER — Ambulatory Visit: Payer: Medicare Other | Admitting: Emergency Medicine

## 2021-07-26 ENCOUNTER — Other Ambulatory Visit: Payer: Self-pay

## 2021-07-26 DIAGNOSIS — J449 Chronic obstructive pulmonary disease, unspecified: Secondary | ICD-10-CM

## 2021-07-26 DIAGNOSIS — J9611 Chronic respiratory failure with hypoxia: Secondary | ICD-10-CM

## 2021-07-26 DIAGNOSIS — Z9981 Dependence on supplemental oxygen: Secondary | ICD-10-CM | POA: Diagnosis not present

## 2021-07-26 DIAGNOSIS — J301 Allergic rhinitis due to pollen: Secondary | ICD-10-CM | POA: Diagnosis not present

## 2021-07-26 NOTE — Telephone Encounter (Signed)
Me ?to Frank [name]   ?CB ?   1:32 PM ?Good Afternoon.  Per our conversation.  [facility] has rec'd your referral.  Per [facility] they are short staffed and are down 1 in staff for referrals.  Please call their office to follow up with your Appointment.  Please call [phone]. ?  ?Please always feel free to call our office if you have any additional questions in regards to your referral. ?  ?  ?Have a great Day!!! ?

## 2021-07-26 NOTE — Progress Notes (Signed)
?Subjective:  ? ? Patient ID: Frank [name], male    DOB: 1955/06/19, 66 y.o.   MRN: [id number] ? ?COPD ?His past medical history is significant for COPD.  ? ?R OV 08/10/2020 --Frank [name] is 29 and follows up today for his chronic hypoxemic respiratory failure due to very severe COPD.  He had COVID-19 in October 2021.  He requires oxygen at 4 L/min.  We have tried him on chronic prednisone but he did not feel that it helped him much.  He is managed on Trelegy.  He uses his albuterol approximately 2x a day. He has had more exertional SOB for the last 2 weeks. ? Increased mucous from pollen but no wheeze or cough.  ?He is not taking flonase right now. He has restarted loratadine about 3 days ago.  ? ?ROV 07/26/21 --Frank [name] is a 72 with severe COPD and associated chronic hypoxemic respiratory failure.  He has allergic rhinitis with cough and nasal obstruction.  I last saw him as a virtual visit August 2022. ?Managed on Trelegy, chronic prednisone 10 mg daily, oxygen 4 L/min ?I treated him for a flare of his COPD about 2 weeks ago with a prednisone taper ?Today he reports that he has ups and downs w his breathing and functional capacity - has trouble w warmer weather / temperature extremes. He has episodes of head and chest congestion, significantly impacts his exercise tolerance. He is using mucinex prn. He uses flonase but not every day. Uses albuterol several times a week. He is up and around - uses wheelchair when he is out of the house.  ?Flu and COVID vaccines up to date ? ? ? ?PULMONARY FUNCTON TEST 08/30/2012  ?FVC 3.93  ?FEV1 1.69  ?FEV1/FVC 43  ?FVC  % Predicted 93  ?FEV % Predicted 55  ?FeF 25-75 .71  ?FeF 25-75 % Predicted 3.05  ? ? ?Review of Systems ?As per HPI ? ?   ?Objective:  ? Physical Exam ?Vitals:  ? 07/26/21 1540  ?BP: 114/68  ?Pulse: 96  ?Temp: 98 ?F (36.7 ?C)  ?TempSrc: Oral  ?SpO2: 95%  ?Weight: 147 lb 12.8 oz (67 kg)  ?Height: '5\' 6"'$  (1.676 m)  ? ?Gen: Pleasant, well-nourished, in no distress,   normal affect ? ?ENT: No lesions,  mouth clear,  oropharynx clear, no postnasal drip ? ?Neck: No JVD, no stridor ? ?Lungs: No use of accessory muscles, very distant, end expiratory wheezes bilaterally ? ?Cardiovascular: RRR, heart sounds normal, no murmur or gallops, no peripheral edema ? ?Musculoskeletal: No deformities, no cyanosis or clubbing ? ?Neuro: alert, non focal ? ?Skin: Warm, no lesions or rashes ? ?   ?Assessment & Plan:  ?COPD (chronic obstructive pulmonary disease) ([place]) ?Disease on chronic prednisone, chronic oxygen.  Recent flare seems to have responded to prednisone.  Current dose 10 mg daily.  Plan to continue same regimen ? ?Please continue Trelegy 1 inhalation once daily.  Rinse and gargle after using. ?Keep your albuterol available to use 2 puffs when needed for shortness of breath, chest tightness, wheezing. ?Continue prednisone 10 mg once daily ?Flu shot and COVID-19 vaccine are both up-to-date ?Continue your oxygen at 4 L/min ?Start using your fluticasone nasal spray 2 sprays each nostril every day during the spring allergy season. ?Follow with Dr Lamonte Sakai in 6 months or sooner if you have any problems ? ?Allergic rhinitis ?He has not been using his fluticasone nasal spray every day.  He has dry cough, posterior pharyngeal mucus.  He needs  to be on the nasal steroid at least during the allergy season.  We discussed this today. ? ?Chronic respiratory failure with hypoxia, on home O2 therapy ([place]) ?Confirm compliance.  Continue 4 L/min ? ?[name], MD, PhD ?07/26/2021, 4:11 PM ?[facility] ?[phone] or if no answer [phone] ? ?

## 2021-07-26 NOTE — Patient Instructions (Addendum)
Please continue Trelegy 1 inhalation once daily.  Rinse and gargle after using. ?Keep your albuterol available to use 2 puffs when needed for shortness of breath, chest tightness, wheezing. ?Continue prednisone 10 mg once daily ?Flu shot and COVID-19 vaccine are both up-to-date ?Continue your oxygen at 4 L/min ?Start using your fluticasone nasal spray 2 sprays each nostril every day during the spring allergy season. ?Follow with Dr Lamonte Sakai in 6 months or sooner if you have any problems ? ?

## 2021-07-26 NOTE — Assessment & Plan Note (Signed)
Disease on chronic prednisone, chronic oxygen.  Recent flare seems to have responded to prednisone.  Current dose 10 mg daily.  Plan to continue same regimen ? ?Please continue Trelegy 1 inhalation once daily.  Rinse and gargle after using. ?Keep your albuterol available to use 2 puffs when needed for shortness of breath, chest tightness, wheezing. ?Continue prednisone 10 mg once daily ?Flu shot and COVID-19 vaccine are both up-to-date ?Continue your oxygen at 4 L/min ?Start using your fluticasone nasal spray 2 sprays each nostril every day during the spring allergy season. ?Follow with Dr Lamonte Sakai in 6 months or sooner if you have any problems ?

## 2021-07-26 NOTE — Assessment & Plan Note (Signed)
Confirm compliance.  Continue 4 L/min ?

## 2021-07-26 NOTE — Assessment & Plan Note (Signed)
He has not been using his fluticasone nasal spray every day.  He has dry cough, posterior pharyngeal mucus.  He needs to be on the nasal steroid at least during the allergy season.  We discussed this today. ?

## 2021-08-02 ENCOUNTER — Telehealth: Payer: Self-pay

## 2021-08-02 NOTE — Telephone Encounter (Signed)
Error

## 2021-08-04 ENCOUNTER — Telehealth: Payer: Self-pay | Admitting: Emergency Medicine

## 2021-08-04 DIAGNOSIS — J449 Chronic obstructive pulmonary disease, unspecified: Secondary | ICD-10-CM

## 2021-08-04 DIAGNOSIS — J9611 Chronic respiratory failure with hypoxia: Secondary | ICD-10-CM

## 2021-08-05 DIAGNOSIS — Z9981 Dependence on supplemental oxygen: Secondary | ICD-10-CM | POA: Diagnosis not present

## 2021-08-05 DIAGNOSIS — J449 Chronic obstructive pulmonary disease, unspecified: Secondary | ICD-10-CM | POA: Diagnosis not present

## 2021-08-05 NOTE — Telephone Encounter (Signed)
Yes I am okay with ordering a POC at 4 L/min ?

## 2021-08-05 NOTE — Telephone Encounter (Signed)
Attempted to call pt but unable to reach and unable to leave VM as mailbox is full. Will try to call back later. ?

## 2021-08-05 NOTE — Telephone Encounter (Signed)
Called and spoke with patient's sister [name]. She stated that she was calling to check on the status of the a POC order from 03/21. I reviewed the OV note and not POC was mentioned or ordered. She confirmed that he is using 4L of O2.  ? ?RB, please advise if you are ok with [place] placing an order for a POC. Thanks!  ?

## 2021-08-10 NOTE — Telephone Encounter (Signed)
ATC x2, no answer and VM full. ?

## 2021-08-11 ENCOUNTER — Encounter: Payer: Self-pay | Admitting: *Deleted

## 2021-08-11 NOTE — Telephone Encounter (Signed)
He has been with [facility] for longer than 6 months, therefore not eligible for POC with them. I tried calling [name] and there was no answer. Closing per protocol. Letter mailed.   ?

## 2021-08-21 ENCOUNTER — Other Ambulatory Visit: Payer: Self-pay | Admitting: Internal Medicine

## 2021-08-21 DIAGNOSIS — N401 Enlarged prostate with lower urinary tract symptoms: Secondary | ICD-10-CM

## 2021-08-22 DIAGNOSIS — R972 Elevated prostate specific antigen [PSA]: Secondary | ICD-10-CM | POA: Diagnosis not present

## 2021-08-22 DIAGNOSIS — R3911 Hesitancy of micturition: Secondary | ICD-10-CM | POA: Diagnosis not present

## 2021-08-22 DIAGNOSIS — N202 Calculus of kidney with calculus of ureter: Secondary | ICD-10-CM | POA: Diagnosis not present

## 2021-08-24 NOTE — Addendum Note (Signed)
Addended by: Lorretta Harp on: 08/24/2021 01:01 PM ? ? Modules accepted: Orders ? ?

## 2021-08-24 NOTE — Telephone Encounter (Signed)
Called and spoke with pt's sister [name] letting her know that pt is not eligible for a POC due to the length of time he has been with [facility]. Stated to her that we could see if they could do a best fit eval for POC and she verbalized understanding. Order has been placed. Nothing further needed. ?

## 2021-09-04 DIAGNOSIS — J449 Chronic obstructive pulmonary disease, unspecified: Secondary | ICD-10-CM | POA: Diagnosis not present

## 2021-09-04 DIAGNOSIS — Z9981 Dependence on supplemental oxygen: Secondary | ICD-10-CM | POA: Diagnosis not present

## 2021-09-20 ENCOUNTER — Other Ambulatory Visit: Payer: Self-pay | Admitting: Emergency Medicine

## 2021-10-05 DIAGNOSIS — Z9981 Dependence on supplemental oxygen: Secondary | ICD-10-CM | POA: Diagnosis not present

## 2021-10-05 DIAGNOSIS — J449 Chronic obstructive pulmonary disease, unspecified: Secondary | ICD-10-CM | POA: Diagnosis not present

## 2021-11-04 DIAGNOSIS — Z9981 Dependence on supplemental oxygen: Secondary | ICD-10-CM | POA: Diagnosis not present

## 2021-11-04 DIAGNOSIS — J449 Chronic obstructive pulmonary disease, unspecified: Secondary | ICD-10-CM | POA: Diagnosis not present

## 2021-11-15 DIAGNOSIS — R972 Elevated prostate specific antigen [PSA]: Secondary | ICD-10-CM | POA: Diagnosis not present

## 2021-11-15 DIAGNOSIS — C61 Malignant neoplasm of prostate: Secondary | ICD-10-CM | POA: Diagnosis not present

## 2021-11-17 ENCOUNTER — Other Ambulatory Visit: Payer: Self-pay | Admitting: Emergency Medicine

## 2021-11-21 ENCOUNTER — Other Ambulatory Visit: Payer: Self-pay | Admitting: *Deleted

## 2021-11-21 MED ORDER — ALBUTEROL SULFATE 0.63 MG/3ML IN NEBU: INHALATION_SOLUTION | RESPIRATORY_TRACT | 5 refills | Status: DC

## 2021-11-22 DIAGNOSIS — R3911 Hesitancy of micturition: Secondary | ICD-10-CM | POA: Diagnosis not present

## 2021-11-22 DIAGNOSIS — N202 Calculus of kidney with calculus of ureter: Secondary | ICD-10-CM | POA: Diagnosis not present

## 2021-11-22 DIAGNOSIS — C61 Malignant neoplasm of prostate: Secondary | ICD-10-CM | POA: Diagnosis not present

## 2021-11-23 ENCOUNTER — Other Ambulatory Visit (HOSPITAL_COMMUNITY): Payer: Self-pay | Admitting: Urology

## 2021-11-23 DIAGNOSIS — C61 Malignant neoplasm of prostate: Secondary | ICD-10-CM

## 2021-12-05 DIAGNOSIS — J449 Chronic obstructive pulmonary disease, unspecified: Secondary | ICD-10-CM | POA: Diagnosis not present

## 2021-12-05 DIAGNOSIS — Z9981 Dependence on supplemental oxygen: Secondary | ICD-10-CM | POA: Diagnosis not present

## 2021-12-05 IMAGING — DX DG CHEST 1V PORT
1 series · 2 of 2 positions shown · non-contrast
Comparison: October 05, 2019.

CLINICAL DATA: Shortness of breath.

EXAM:
PORTABLE CHEST 1 VIEW

[Series 1: chest ap · 0.14mm/px · 2 of 2 slices shown]
[im 1/2]
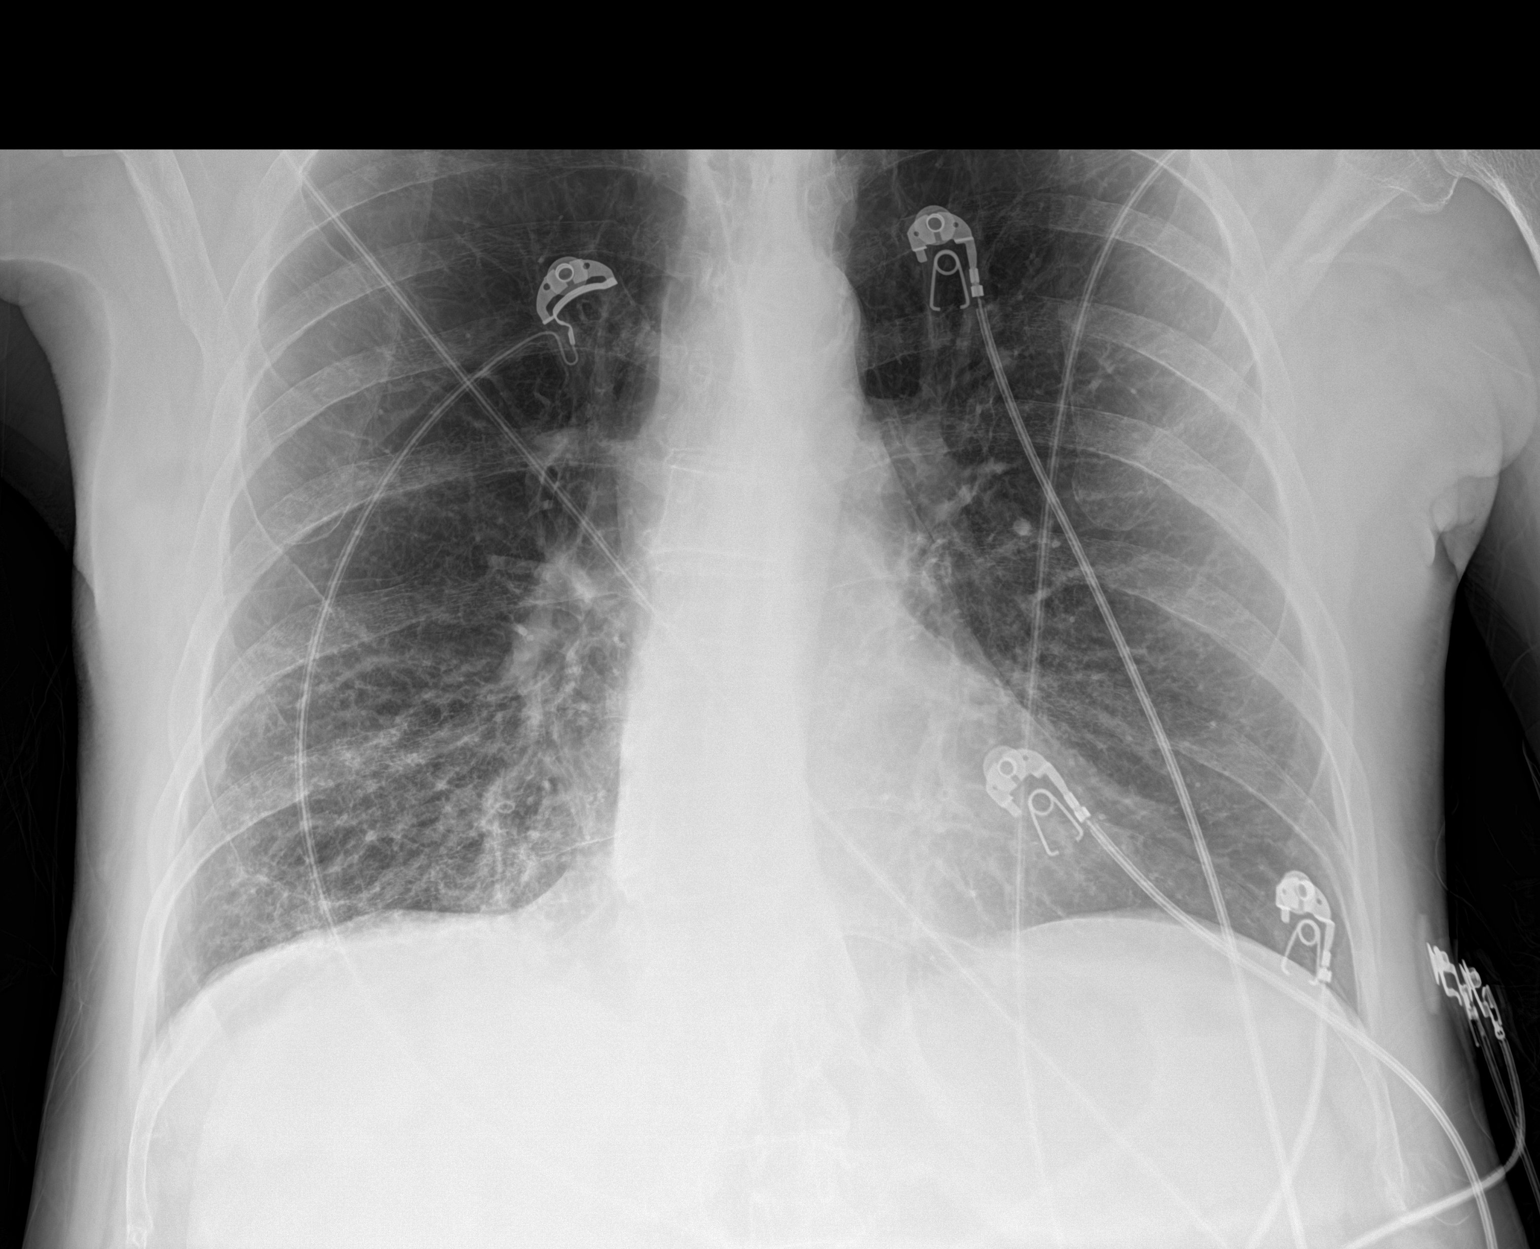
[im 2/2]
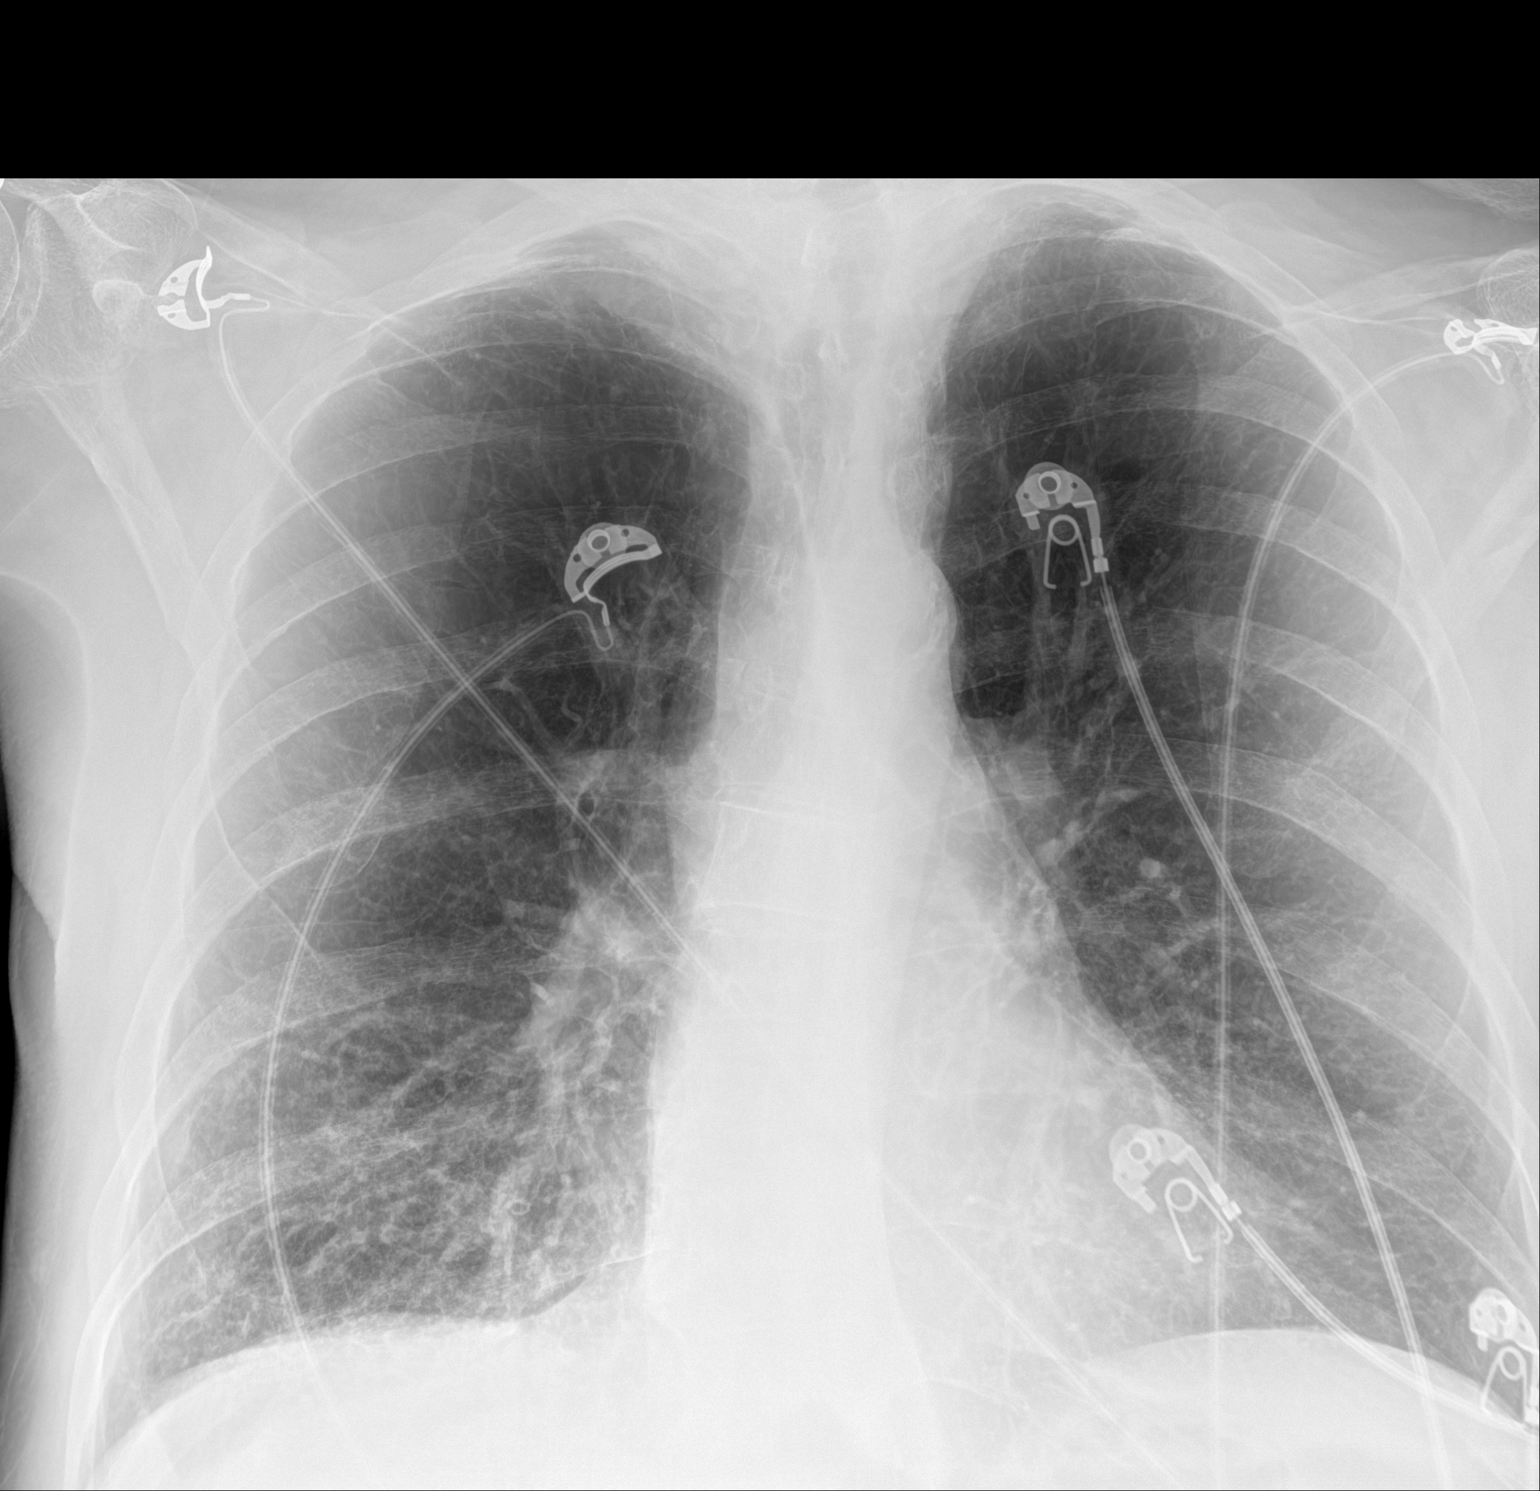

[2 of 2 positions shown; findings below may reference images not displayed]

FINDINGS: The heart size and mediastinal contours are within normal limits. No
pneumothorax or pleural effusion is noted. Emphysematous disease is
noted. Stable right basilar interstitial densities are noted
concerning for scarring or atelectasis. The visualized skeletal
structures are unremarkable.
IMPRESSION: Stable right basilar interstitial densities are noted concerning for
scarring or atelectasis. Emphysematous disease is noted.

Emphysema ([id number]).

## 2021-12-06 DIAGNOSIS — N200 Calculus of kidney: Secondary | ICD-10-CM | POA: Diagnosis not present

## 2021-12-06 DIAGNOSIS — C61 Malignant neoplasm of prostate: Secondary | ICD-10-CM | POA: Diagnosis not present

## 2021-12-07 ENCOUNTER — Encounter (HOSPITAL_COMMUNITY)
Admission: RE | Admit: 2021-12-07 | Discharge: 2021-12-07 | Disposition: A | Discharge status: Home / Self Care | Payer: Medicare Other | Source: Ambulatory Visit | Attending: Urology | Admitting: Urology

## 2021-12-07 DIAGNOSIS — C61 Malignant neoplasm of prostate: Secondary | ICD-10-CM | POA: Diagnosis not present

## 2021-12-07 MED ORDER — TECHNETIUM TC 99M MEDRONATE IV KIT
20.3000 | PACK | Freq: Once | INTRAVENOUS | Status: AC
  Administered 2021-12-07: 20.3 via INTRAVENOUS

## 2021-12-16 DIAGNOSIS — Z5111 Encounter for antineoplastic chemotherapy: Secondary | ICD-10-CM | POA: Diagnosis not present

## 2021-12-16 DIAGNOSIS — C61 Malignant neoplasm of prostate: Secondary | ICD-10-CM | POA: Diagnosis not present

## 2022-01-01 DIAGNOSIS — J449 Chronic obstructive pulmonary disease, unspecified: Secondary | ICD-10-CM | POA: Diagnosis not present

## 2022-01-03 DIAGNOSIS — N202 Calculus of kidney with calculus of ureter: Secondary | ICD-10-CM | POA: Diagnosis not present

## 2022-01-03 DIAGNOSIS — C61 Malignant neoplasm of prostate: Secondary | ICD-10-CM | POA: Diagnosis not present

## 2022-01-03 DIAGNOSIS — C7951 Secondary malignant neoplasm of bone: Secondary | ICD-10-CM | POA: Diagnosis not present

## 2022-01-03 DIAGNOSIS — R3911 Hesitancy of micturition: Secondary | ICD-10-CM | POA: Diagnosis not present

## 2022-01-04 ENCOUNTER — Other Ambulatory Visit: Payer: Self-pay | Admitting: Urology

## 2022-01-05 DIAGNOSIS — Z9981 Dependence on supplemental oxygen: Secondary | ICD-10-CM | POA: Diagnosis not present

## 2022-01-05 DIAGNOSIS — J449 Chronic obstructive pulmonary disease, unspecified: Secondary | ICD-10-CM | POA: Diagnosis not present

## 2022-01-05 NOTE — Progress Notes (Signed)
Sent message, via epic in basket, requesting orders in epic from surgeon.  

## 2022-01-06 IMAGING — DX DG CHEST 2V
2 series · 2 of 2 positions shown · non-contrast
Comparison: 02/28/2020, 10/05/2019

CLINICAL DATA: Follow-up COVID pneumonia

EXAM:
CHEST - 2 VIEW

[chest pa]
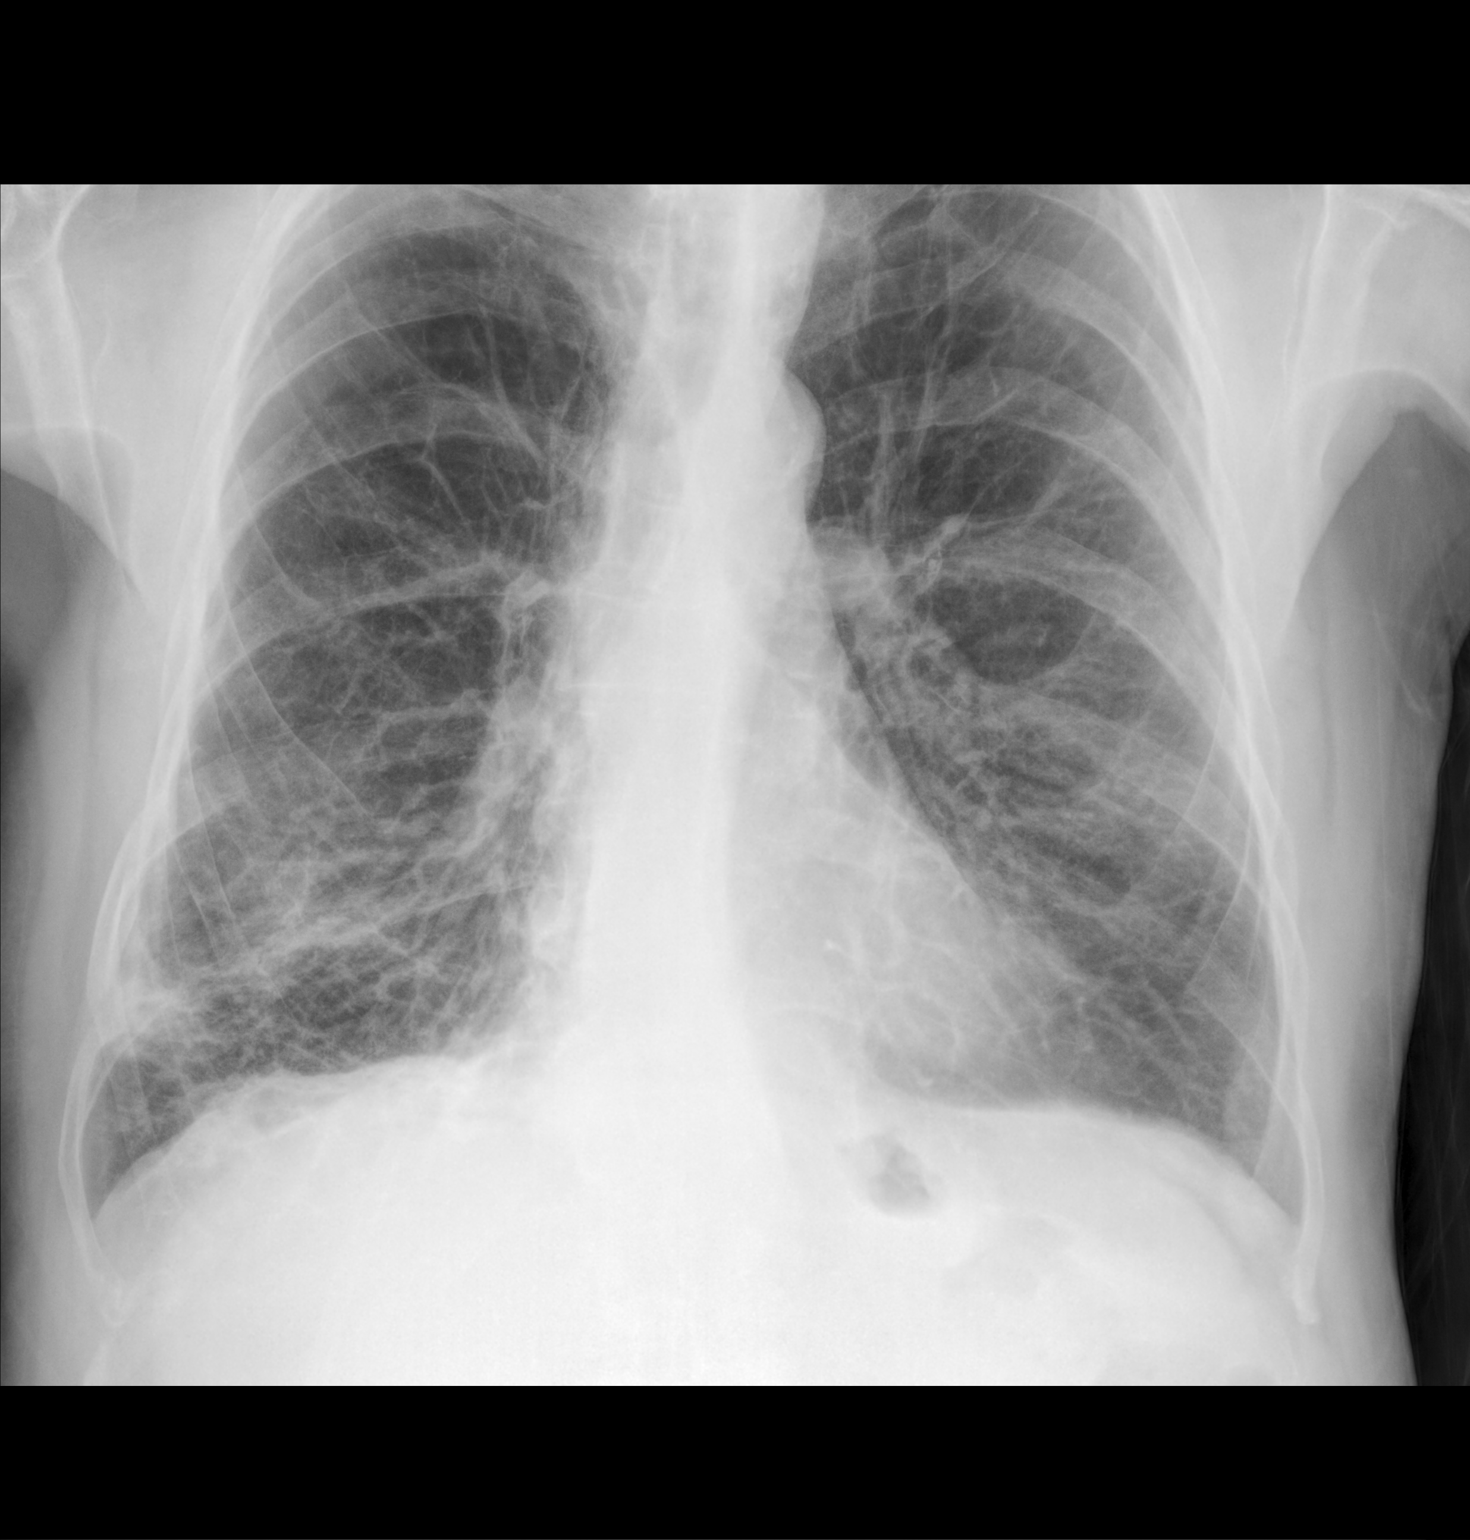

[chest lat]
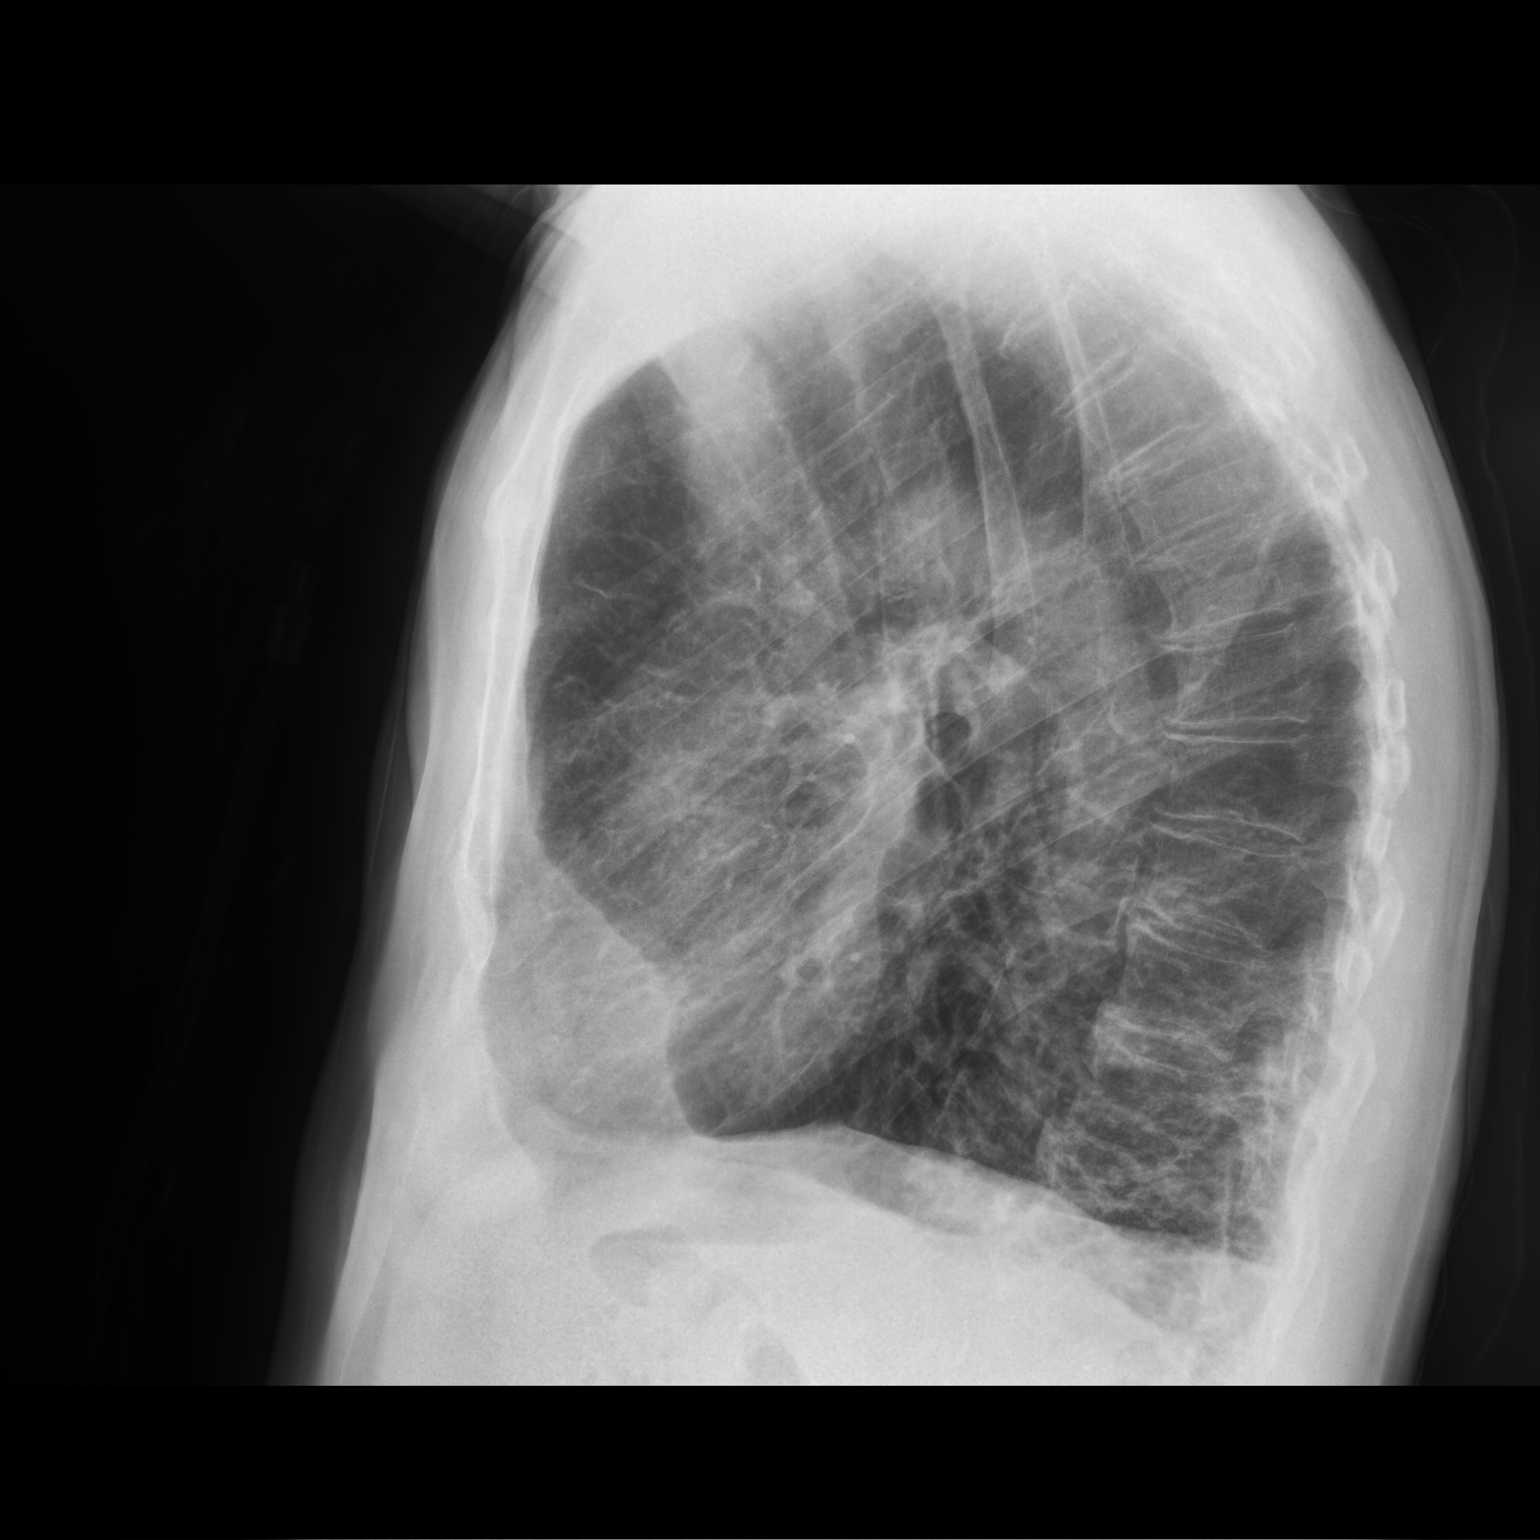

[2 of 2 positions shown; findings below may reference images not displayed]

FINDINGS: Emphysematous disease. Chronic bilateral interstitial opacity. New
streaky and patchy opacity at the right lung base. Stable
cardiomediastinal silhouette with aortic atherosclerosis. No
pneumothorax.
IMPRESSION: Underlying emphysematous disease and chronic interstitial opacities.
Interval streaky and patchy right basilar and peripheral opacity
which may reflect residual pneumonia or possible developing scar.

## 2022-01-10 NOTE — Patient Instructions (Signed)
DUE TO COVID-19 ONLY TWO VISITORS  (aged 68 and older)  ARE ALLOWED TO COME WITH YOU AND STAY IN THE WAITING ROOM ONLY DURING PRE OP AND PROCEDURE.   **NO VISITORS ARE ALLOWED IN THE SHORT STAY AREA OR RECOVERY ROOM!!**  IF YOU WILL BE ADMITTED INTO THE HOSPITAL YOU ARE ALLOWED ONLY FOUR SUPPORT PEOPLE DURING VISITATION HOURS ONLY (7 AM -8PM)   The support person(s) must pass our screening, gel in and out, and wear a mask at all times, including in the patient's room. Patients must also wear a mask when staff or their support person are in the room. Visitors GUEST BADGE MUST BE WORN VISIBLY  One adult visitor may remain with you overnight and MUST be in the room by 8 P.M.     Your procedure is scheduled on: 01/13/22   Report to [facility] Main Entrance    Report to admitting at: 12:45 PM   Call this number if you have problems the morning of surgery [phone]   Do not eat food :After Midnight.   After Midnight you may have the following liquids until: 12:00 PM DAY OF SURGERY  Water Black Coffee (sugar ok, NO MILK/CREAM OR CREAMERS)  Tea (sugar ok, NO MILK/CREAM OR CREAMERS) regular and decaf                             Plain Jell-O (NO RED)                                           Fruit ices (not with fruit pulp, NO RED)                                     Popsicles (NO RED)                                                                  Juice: apple, WHITE grape, WHITE cranberry Sports drinks like Gatorade (NO RED)    Oral Hygiene is also important to reduce your risk of infection.                                    Remember - BRUSH YOUR TEETH THE MORNING OF SURGERY WITH YOUR REGULAR TOOTHPASTE   Do NOT smoke after Midnight   Take these medicines the morning of surgery with A SIP OF WATER: prednisone.Use Flonase and inhalers as usual.  DO NOT TAKE ANY ORAL DIABETIC MEDICATIONS DAY OF YOUR SURGERY  Bring CPAP mask and tubing day of surgery.                               You may not have any metal on your body including hair pins, jewelry, and body piercing             Do not wear lotions, powders, perfumes/cologne, or deodorant  Men may shave face and neck.   Do not bring valuables to the hospital. [place].   Contacts, dentures or bridgework may not be worn into surgery.   Bring small overnight bag day of surgery.   DO NOT [place]. PHARMACY WILL DISPENSE MEDICATIONS LISTED ON YOUR MEDICATION LIST TO YOU DURING YOUR ADMISSION [place]!    Patients discharged on the day of surgery will not be allowed to drive home.  Someone NEEDS to stay with you for the first 24 hours after anesthesia.   Special Instructions: Bring a copy of your healthcare power of attorney and living will documents         the day of surgery if you haven't scanned them before.              Please read over the following fact sheets you were given: IF YOU HAVE QUESTIONS ABOUT YOUR PRE-OP INSTRUCTIONS PLEASE CALL [phone]     [facility] - Preparing for Surgery Before surgery, you can play an important role.  Because skin is not sterile, your skin needs to be as free of germs as possible.  You can reduce the number of germs on your skin by washing with CHG (chlorahexidine gluconate) soap before surgery.  CHG is an antiseptic cleaner which kills germs and bonds with the skin to continue killing germs even after washing. Please DO NOT use if you have an allergy to CHG or antibacterial soaps.  If your skin becomes reddened/irritated stop using the CHG and inform your nurse when you arrive at Short Stay. Do not shave (including legs and underarms) for at least 48 hours prior to the first CHG shower.  You may shave your face/neck. Please follow these instructions carefully:  1.  Shower with CHG Soap the night before surgery and the  morning of Surgery.  2.  If you choose to wash  your hair, wash your hair first as usual with your  normal  shampoo.  3.  After you shampoo, rinse your hair and body thoroughly to remove the  shampoo.                           4.  Use CHG as you would any other liquid soap.  You can apply chg directly  to the skin and wash                       Gently with a scrungie or clean washcloth.  5.  Apply the CHG Soap to your body ONLY FROM THE NECK DOWN.   Do not use on face/ open                           Wound or open sores. Avoid contact with eyes, ears mouth and genitals (private parts).                       Wash face,  Genitals (private parts) with your normal soap.             6.  Wash thoroughly, paying special attention to the area where your surgery  will be performed.  7.  Thoroughly rinse your body with warm water from the neck down.  8.  DO NOT shower/wash with your normal soap after using and rinsing off  the CHG Soap.                9.  Pat yourself dry with a clean towel.            10.  Wear clean pajamas.            11.  Place clean sheets on your bed the night of your first shower and do not  sleep with pets. Day of Surgery : Do not apply any lotions/deodorants the morning of surgery.  Please wear clean clothes to the hospital/surgery center.  FAILURE TO FOLLOW THESE INSTRUCTIONS MAY RESULT IN THE CANCELLATION OF YOUR SURGERY PATIENT SIGNATURE_________________________________  NURSE SIGNATURE__________________________________  ________________________________________________________________________

## 2022-01-11 ENCOUNTER — Other Ambulatory Visit: Payer: Self-pay

## 2022-01-11 ENCOUNTER — Encounter (HOSPITAL_COMMUNITY)
Admission: RE | Admit: 2022-01-11 | Discharge: 2022-01-11 | Disposition: A | Discharge status: Home / Self Care | Payer: Medicare Other | Source: Ambulatory Visit | Attending: Urology | Admitting: Urology

## 2022-01-11 ENCOUNTER — Encounter (HOSPITAL_COMMUNITY): Payer: Self-pay

## 2022-01-11 VITALS — BP 127/65 | HR 97 | Temp 98.1°F | Ht 66.0 in | Wt 153.0 lb

## 2022-01-11 DIAGNOSIS — Z01818 Encounter for other preprocedural examination: Secondary | ICD-10-CM | POA: Insufficient documentation

## 2022-01-11 DIAGNOSIS — I2510 Atherosclerotic heart disease of native coronary artery without angina pectoris: Secondary | ICD-10-CM | POA: Diagnosis not present

## 2022-01-11 HISTORY — DX: Malignant (primary) neoplasm, unspecified: C80.1

## 2022-01-11 HISTORY — DX: Unspecified osteoarthritis, unspecified site: M19.90

## 2022-01-11 LAB — BASIC METABOLIC PANEL
Anion gap: 9 (ref 5–15)
BUN: 25 mg/dL — ABNORMAL HIGH (ref 8–23)
CO2: 36 mmol/L — ABNORMAL HIGH (ref 22–32)
Calcium: 9.6 mg/dL (ref 8.9–10.3)
Chloride: 95 mmol/L — ABNORMAL LOW (ref 98–111)
Creatinine, Ser: 0.81 mg/dL (ref 0.61–1.24)
GFR, Estimated: >60 mL/min (ref >60)
Glucose, Bld: 131 mg/dL — ABNORMAL HIGH (ref 70–99)
Potassium: 4.4 mmol/L (ref 3.5–5.1)
Sodium: 140 mmol/L (ref 135–145)

## 2022-01-11 LAB — CBC
HCT: 45.4 % (ref 39.0–52.0)
Hemoglobin: 14 g/dL (ref 13.0–17.0)
MCH: 29.2 pg (ref 26.0–34.0)
MCHC: 30.8 g/dL (ref 30.0–36.0)
MCV: 94.6 fL (ref 80.0–100.0)
Platelets: 214 10*3/uL (ref 150–400)
RBC: 4.8 MIL/uL (ref 4.22–5.81)
RDW: 12 % (ref 11.5–15.5)
WBC: 8.9 10*3/uL (ref 4.0–10.5)
nRBC: 0 % (ref 0.0–0.2)

## 2022-01-11 NOTE — Progress Notes (Addendum)
For Short Stay: [place] appointment date: Date of COVID positive in last 31 days:  Bowel Prep reminder:   For Anesthesia: PCP - Dr. Serita Butcher. Cardiologist - N/A  Chest x-ray -  EKG -  Stress Test -  ECHO - 06/06/2012 Cardiac Cath -  Pacemaker/ICD device last checked: Pacemaker orders received: Device Rep notified:  Spinal Cord Stimulator:  Sleep Study -  CPAP -   Fasting Blood Sugar -  Checks Blood Sugar _____ times a day Date and result of last Hgb A1c-  Blood Thinner Instructions: Aspirin Instructions: Last Dose:  Activity level: Can go up a flight of stairs and activities of daily living without stopping and without chest pain and/or shortness of breath   Able to exercise without chest pain and/or shortness of breath   Unable to go up a flight of stairs without chest pain and/or shortness of breath     Anesthesia review: Hx: Emphysema,O2 dependent: 4L,COPD,Dysrhythmias,Heart murmur  Patient denies shortness of breath, fever, cough and chest pain at PAT appointment   Patient verbalized understanding of instructions that were given to them at the PAT appointment. Patient was also instructed that they will need to review over the PAT instructions again at home before surgery.

## 2022-01-12 MED ORDER — GENTAMICIN SULFATE 40 MG/ML IJ SOLN
340.0000 mg | INTRAVENOUS | Status: AC
  Administered 2022-01-13: 340 mg via INTRAVENOUS
  Filled 2022-01-12: qty 8.5

## 2022-01-13 ENCOUNTER — Ambulatory Visit (HOSPITAL_COMMUNITY): Payer: Medicare Other | Admitting: Physician Assistant

## 2022-01-13 ENCOUNTER — Encounter (HOSPITAL_COMMUNITY): Payer: Self-pay | Admitting: Urology

## 2022-01-13 ENCOUNTER — Ambulatory Visit (HOSPITAL_COMMUNITY)
Admission: RE | Admit: 2022-01-13 | Discharge: 2022-01-13 | Disposition: A | Discharge status: Home / Self Care | Payer: Medicare Other | Attending: Urology | Admitting: Urology

## 2022-01-13 ENCOUNTER — Ambulatory Visit (HOSPITAL_BASED_OUTPATIENT_CLINIC_OR_DEPARTMENT_OTHER): Payer: Medicare Other | Admitting: Certified Registered Nurse Anesthetist

## 2022-01-13 ENCOUNTER — Encounter (HOSPITAL_COMMUNITY)
Admission: RE | Disposition: A | Discharge status: Home / Self Care | Payer: Self-pay | Source: Home / Self Care | Attending: Urology

## 2022-01-13 ENCOUNTER — Ambulatory Visit (HOSPITAL_COMMUNITY): Payer: Medicare Other

## 2022-01-13 DIAGNOSIS — J439 Emphysema, unspecified: Secondary | ICD-10-CM | POA: Insufficient documentation

## 2022-01-13 DIAGNOSIS — N132 Hydronephrosis with renal and ureteral calculous obstruction: Secondary | ICD-10-CM | POA: Insufficient documentation

## 2022-01-13 DIAGNOSIS — N210 Calculus in bladder: Secondary | ICD-10-CM

## 2022-01-13 DIAGNOSIS — C799 Secondary malignant neoplasm of unspecified site: Secondary | ICD-10-CM | POA: Diagnosis not present

## 2022-01-13 DIAGNOSIS — Z87891 Personal history of nicotine dependence: Secondary | ICD-10-CM | POA: Diagnosis not present

## 2022-01-13 DIAGNOSIS — C7951 Secondary malignant neoplasm of bone: Secondary | ICD-10-CM | POA: Diagnosis not present

## 2022-01-13 DIAGNOSIS — N201 Calculus of ureter: Secondary | ICD-10-CM | POA: Diagnosis not present

## 2022-01-13 DIAGNOSIS — C61 Malignant neoplasm of prostate: Secondary | ICD-10-CM | POA: Diagnosis not present

## 2022-01-13 DIAGNOSIS — J449 Chronic obstructive pulmonary disease, unspecified: Secondary | ICD-10-CM

## 2022-01-13 HISTORY — PX: CYSTOSCOPY WITH LITHOLAPAXY: SHX1425

## 2022-01-13 HISTORY — PX: CYSTOSCOPY/URETEROSCOPY/HOLMIUM LASER/STENT PLACEMENT: SHX6546

## 2022-01-13 SURGERY — CYSTOSCOPY, WITH BLADDER CALCULUS LITHOLAPAXY
Anesthesia: General | Laterality: Right

## 2022-01-13 MED ORDER — FENTANYL CITRATE (PF) 100 MCG/2ML IJ SOLN
INTRAMUSCULAR | Status: DC | PRN
Start: 2022-01-13 — End: 2022-01-13
  Administered 2022-01-13: 50 ug via INTRAVENOUS

## 2022-01-13 MED ORDER — OXYCODONE-ACETAMINOPHEN 5-325 MG PO TABS: 1.0000 | ORAL_TABLET | Freq: Four times a day (QID) | ORAL | 0 refills | Status: DC | PRN

## 2022-01-13 MED ORDER — FENTANYL CITRATE PF 50 MCG/ML IJ SOSY: 25.0000 ug | PREFILLED_SYRINGE | INTRAMUSCULAR | Status: DC | PRN

## 2022-01-13 MED ORDER — PROPOFOL 10 MG/ML IV BOLUS
INTRAVENOUS | Status: DC | PRN
  Administered 2022-01-13: 30 mg via INTRAVENOUS
  Administered 2022-01-13: 150 mg via INTRAVENOUS
  Administered 2022-01-13: 20 mg via INTRAVENOUS

## 2022-01-13 MED ORDER — 0.9 % SODIUM CHLORIDE (POUR BTL) OPTIME
TOPICAL | Status: DC | PRN
  Administered 2022-01-13: 1000 mL

## 2022-01-13 MED ORDER — IOHEXOL 300 MG/ML  SOLN
INTRAMUSCULAR | Status: DC | PRN
  Administered 2022-01-13: 6 mL

## 2022-01-13 MED ORDER — LIDOCAINE HCL (PF) 2 % IJ SOLN
INTRAMUSCULAR | Status: AC
  Filled 2022-01-13: qty 5

## 2022-01-13 MED ORDER — ONDANSETRON HCL 4 MG/2ML IJ SOLN: 4.0000 mg | Freq: Once | INTRAMUSCULAR | Status: DC | PRN

## 2022-01-13 MED ORDER — ONDANSETRON HCL 4 MG/2ML IJ SOLN
INTRAMUSCULAR | Status: AC
  Filled 2022-01-13: qty 2

## 2022-01-13 MED ORDER — CEPHALEXIN 500 MG PO CAPS: 500.0000 mg | ORAL_CAPSULE | Freq: Two times a day (BID) | ORAL | 0 refills | Status: AC

## 2022-01-13 MED ORDER — LACTATED RINGERS IV SOLN: INTRAVENOUS | Status: DC

## 2022-01-13 MED ORDER — PROPOFOL 10 MG/ML IV BOLUS
INTRAVENOUS | Status: AC
  Filled 2022-01-13: qty 20

## 2022-01-13 MED ORDER — ACETAMINOPHEN 10 MG/ML IV SOLN: 1000.0000 mg | Freq: Once | INTRAVENOUS | Status: DC | PRN

## 2022-01-13 MED ORDER — DEXAMETHASONE SODIUM PHOSPHATE 10 MG/ML IJ SOLN
INTRAMUSCULAR | Status: AC
  Filled 2022-01-13: qty 1

## 2022-01-13 MED ORDER — DEXAMETHASONE SODIUM PHOSPHATE 4 MG/ML IJ SOLN
INTRAMUSCULAR | Status: DC | PRN
  Administered 2022-01-13: 5 mg via INTRAVENOUS

## 2022-01-13 MED ORDER — ORAL CARE MOUTH RINSE: 15.0000 mL | Freq: Once | OROMUCOSAL | Status: AC

## 2022-01-13 MED ORDER — EPHEDRINE SULFATE-NACL 50-0.9 MG/10ML-% IV SOSY
PREFILLED_SYRINGE | INTRAVENOUS | Status: DC | PRN
  Administered 2022-01-13: 10 mg via INTRAVENOUS
  Administered 2022-01-13: 5 mg via INTRAVENOUS

## 2022-01-13 MED ORDER — PHENYLEPHRINE 80 MCG/ML (10ML) SYRINGE FOR IV PUSH (FOR BLOOD PRESSURE SUPPORT)
PREFILLED_SYRINGE | INTRAVENOUS | Status: DC | PRN
  Administered 2022-01-13: 80 ug via INTRAVENOUS

## 2022-01-13 MED ORDER — FENTANYL CITRATE (PF) 100 MCG/2ML IJ SOLN
INTRAMUSCULAR | Status: AC
  Filled 2022-01-13: qty 2

## 2022-01-13 MED ORDER — LIDOCAINE 2% (20 MG/ML) 5 ML SYRINGE
INTRAMUSCULAR | Status: DC | PRN
  Administered 2022-01-13: 100 mg via INTRAVENOUS

## 2022-01-13 MED ORDER — ALBUTEROL SULFATE (2.5 MG/3ML) 0.083% IN NEBU
2.5000 mg | INHALATION_SOLUTION | Freq: Once | RESPIRATORY_TRACT | Status: AC
  Administered 2022-01-13: 2.5 mg via RESPIRATORY_TRACT

## 2022-01-13 MED ORDER — ONDANSETRON HCL 4 MG/2ML IJ SOLN
INTRAMUSCULAR | Status: DC | PRN
  Administered 2022-01-13: 4 mg via INTRAVENOUS

## 2022-01-13 MED ORDER — CHLORHEXIDINE GLUCONATE 0.12 % MT SOLN
15.0000 mL | Freq: Once | OROMUCOSAL | Status: AC
Start: 2022-01-13 — End: 2022-01-13
  Administered 2022-01-13: 15 mL via OROMUCOSAL

## 2022-01-13 MED ORDER — OXYCODONE HCL 5 MG PO TABS: 5.0000 mg | ORAL_TABLET | Freq: Once | ORAL | Status: DC | PRN

## 2022-01-13 MED ORDER — SODIUM CHLORIDE 0.9 % IR SOLN
Status: DC | PRN
  Administered 2022-01-13: 3000 mL

## 2022-01-13 MED ORDER — OXYCODONE HCL 5 MG/5ML PO SOLN: 5.0000 mg | Freq: Once | ORAL | Status: DC | PRN

## 2022-01-13 MED ORDER — ALBUTEROL SULFATE (2.5 MG/3ML) 0.083% IN NEBU
INHALATION_SOLUTION | RESPIRATORY_TRACT | Status: AC
  Filled 2022-01-13: qty 3

## 2022-01-13 SURGICAL SUPPLY — 28 items
BAG URINE DRAIN 2000ML AR STRL (UROLOGICAL SUPPLIES) IMPLANT
BAG URO CATCHER STRL LF (MISCELLANEOUS) ×2 IMPLANT
BASKET LASER NITINOL 1.9FR (BASKET) IMPLANT
CATH TIEMANN FOLEY 18FR 5CC (CATHETERS) IMPLANT
CATH URETL OPEN END 6FR 70 (CATHETERS) ×2 IMPLANT
CLOTH BEACON ORANGE TIMEOUT ST (SAFETY) ×2 IMPLANT
EXTRACTOR STONE 1.7FRX115CM (UROLOGICAL SUPPLIES) IMPLANT
GLOVE SURG LX STRL 7.5 STRW (GLOVE) ×2 IMPLANT
GUIDEWIRE ANG ZIPWIRE 038X150 (WIRE) ×2 IMPLANT
GUIDEWIRE STR DUAL SENSOR (WIRE) ×2 IMPLANT
HOLDER FOLEY CATH W/STRAP (MISCELLANEOUS) IMPLANT
KIT TURNOVER KIT A (KITS) IMPLANT
LASER FIB FLEXIVA PULSE ID 365 (Laser) IMPLANT
LASER FIB FLEXIVA PULSE ID 550 (Laser) ×2 IMPLANT
LASER FIB FLEXIVA PULSE ID 910 (Laser) ×2 IMPLANT
MANIFOLD NEPTUNE II (INSTRUMENTS) ×2 IMPLANT
PACK CYSTO (CUSTOM PROCEDURE TRAY) ×2 IMPLANT
SHEATH NAVIGATOR HD 11/13X28 (SHEATH) IMPLANT
SHEATH NAVIGATOR HD 11/13X36 (SHEATH) IMPLANT
STENT POLARIS 5FRX24 (STENTS) IMPLANT
SYR TOOMEY IRRIG 70ML (MISCELLANEOUS) ×2
SYRINGE TOOMEY IRRIG 70ML (MISCELLANEOUS) IMPLANT
TRACTIP FLEXIVA PULS ID 200XHI (Laser) IMPLANT
TRACTIP FLEXIVA PULSE ID 200 (Laser) ×2
TUBE FEEDING 8FR 16IN STR KANG (MISCELLANEOUS) ×2 IMPLANT
TUBING CONNECTING 10 (TUBING) ×2 IMPLANT
TUBING UROLOGY SET (TUBING) ×2 IMPLANT
WATER STERILE IRR 500ML POUR (IV SOLUTION) IMPLANT

## 2022-01-13 NOTE — Anesthesia Postprocedure Evaluation (Signed)
Anesthesia Post Note  Patient: Frank [name]  Procedure(s) Performed: CYSTOSCOPY WITH LITHOLAPAXY CYSTOSCOPY/URETEROSCOPY/HOLMIUM LASER/STENT PLACEMENT (Right)     Patient location during evaluation: PACU Anesthesia Type: General Level of consciousness: awake and alert Pain management: pain level controlled Vital Signs Assessment: post-procedure vital signs reviewed and stable Respiratory status: spontaneous breathing, nonlabored ventilation, respiratory function stable and patient connected to nasal cannula oxygen Cardiovascular status: blood pressure returned to baseline and stable Postop Assessment: no apparent nausea or vomiting Anesthetic complications: no   No notable events documented.  Last Vitals:  Vitals:   01/13/22 1715 01/13/22 1730  BP: 130/72 127/69  Pulse: 84 84  Resp: (!) 21 (!) 21  Temp:  36.4 C  SpO2: 92% 90%    Last Pain:  Vitals:   01/13/22 1730  TempSrc:   PainSc: 0-No pain                 Kathy Wares S

## 2022-01-13 NOTE — Anesthesia Procedure Notes (Signed)
Procedure Name: LMA Insertion Date/Time: 01/13/2022 3:15 PM  Performed by: Claudia Desanctis, CRNAPre-anesthesia Checklist: Emergency Drugs available, Patient identified, Suction available and Patient being monitored Patient Re-evaluated:Patient Re-evaluated prior to induction Oxygen Delivery Method: Circle system utilized Preoxygenation: Pre-oxygenation with 100% oxygen Induction Type: IV induction Ventilation: Mask ventilation without difficulty LMA: LMA inserted LMA Size: 4.0 Number of attempts: 1 Placement Confirmation: positive ETCO2 and breath sounds checked- equal and bilateral Tube secured with: Tape Dental Injury: Teeth and Oropharynx as per pre-operative assessment

## 2022-01-13 NOTE — Anesthesia Preprocedure Evaluation (Signed)
Anesthesia Evaluation  Patient identified by MRN, date of birth, ID band Patient awake    Reviewed: Allergy & Precautions, NPO status , Patient's Chart, lab work & pertinent test results  Airway Mallampati: II  TM Distance: >3 FB Neck ROM: Full    Dental no notable dental hx.    Pulmonary COPD,  COPD inhaler and oxygen dependent, former smoker,    breath sounds clear to auscultation + decreased breath sounds      Cardiovascular negative cardio ROS Normal cardiovascular exam Rhythm:Regular Rate:Normal     Neuro/Psych negative neurological ROS  negative psych ROS   GI/Hepatic negative GI ROS, Neg liver ROS,   Endo/Other  negative endocrine ROS  Renal/GU negative Renal ROS  negative genitourinary   Musculoskeletal negative musculoskeletal ROS (+)   Abdominal   Peds negative pediatric ROS (+)  Hematology negative hematology ROS (+)   Anesthesia Other Findings   Reproductive/Obstetrics negative OB ROS                             Anesthesia Physical Anesthesia Plan  ASA: 3  Anesthesia Plan: General   Post-op Pain Management: Minimal or no pain anticipated   Induction: Intravenous  PONV Risk Score and Plan: 2 and Ondansetron, Dexamethasone and Treatment may vary due to age or medical condition  Airway Management Planned: LMA  Additional Equipment:   Intra-op Plan:   Post-operative Plan: Extubation in OR  Informed Consent: I have reviewed the patients History and Physical, chart, labs and discussed the procedure including the risks, benefits and alternatives for the proposed anesthesia with the patient or authorized representative who has indicated his/her understanding and acceptance.     Dental advisory given  Plan Discussed with: CRNA and Surgeon  Anesthesia Plan Comments:         Anesthesia Quick Evaluation

## 2022-01-13 NOTE — Transfer of Care (Signed)
Immediate Anesthesia Transfer of Care Note  Patient: Frank [name]  Procedure(s) Performed: CYSTOSCOPY WITH LITHOLAPAXY CYSTOSCOPY/URETEROSCOPY/HOLMIUM LASER/STENT PLACEMENT (Right)  Patient Location: PACU  Anesthesia Type:General  Level of Consciousness: drowsy  Airway & Oxygen Therapy: Patient Spontanous Breathing and Patient connected to face mask oxygen  Post-op Assessment: Report given to RN and Post -op Vital signs reviewed and stable  Post vital signs: Reviewed and stable  Last Vitals:  Vitals Value Taken Time  BP 129/69 01/13/22 1640  Temp    Pulse 88 01/13/22 1643  Resp 28 01/13/22 1643  SpO2 100 % 01/13/22 1643  Vitals shown include unvalidated device data.  Last Pain:  Vitals:   01/13/22 1318  TempSrc: Oral  PainSc:          Complications: No notable events documented.

## 2022-01-13 NOTE — OR Nursing (Signed)
Stone taken by Dr. Manny 

## 2022-01-13 NOTE — H&P (Signed)
Frank [name] is an 66 y.o. male.    Chief Complaint: Pre-OP RIGHT Ureteroscopic Stone Manipulation / Cystolithalopexy  HPI:   1 - Recurrent Urolithiasis -  2018 - Rt SWL for ureteral stone  2023 - 87m Rt ureteral + 2.5cm bladder stone withotu sig hydro on prostate ancer staging CT.   2 - Metastatic Prostate Cancer -  Initial DX: - 12/12 cores Grade 5 cancer by BX 11/2021 on eval PSA 378. TRUS 339mwith loss of lateral fat planes, no median.  Most Recent Restaging: 12/2021 CT, Bone Scan - vertebral, sacral mets, no adenopathy   Recent Course:  12/2021 - Eligard 45   3 - Lower Urinary Tract Symptoms- progresive bother form mostly obstructivey sympotms. Placed on tamsulosin by PCP 08/2021 with good symptom result DRE 08/2021 "60gm". UA 08/2021 normal. PVR 08/2021 "41357msignificant elevation.   4 - Bone Metastasis - multifocal, but not large volume prostate cancer bone mets 12/2021.   PMH sig for COPD / O2 (follow R> Frank [name] pulm), no CV disease. 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 He lives by himself but does not drive. His PCP is with [facility].   Today "Frank [name] seen to proceed with ureteroscopy for incidental ureteral stone and cystolithalopexy for large volume bladder stones. NO interval fevers.   Past Medical History:  Diagnosis Date   Arthritis    [place]  [place]D [place]ronic [place] disease) [place]   Dyspnea    Dysrhythmia    Emphysema 06/07/2012   Heart murmur    hx of as baby   History of kidney stones    Hypoxemia 06/07/2012   Obstructive chronic [place]itis with exacerbation [place]   Pneumonia    Tobacco abuse 06/05/2012    Past Surgical History:  Procedure Laterality Date   CATARACT EXTRACTION, BILATERAL     CYSTOSCOPY/RETROGRADE/URETEROSCOPY/STONE EXTRACTION WITH BASKET Right 10/04/2016   Procedure: CYSTOSCOPY/RETROGRADE/URETEROSCOPY/STONE EXTRACTION WITH BASKET/ STENT;  Surgeon:  ManAlexis FrockD;  Location: [facility];  Service: Urology;  Laterality: Right;  With LASER   EXTRACORPOREAL SHOCK WAVE LITHOTRIPSY Right 06/29/2016   Procedure: RIGHT EXTRACORPOREAL SHOCK WAVE LITHOTRIPSY (ESWL);  Surgeon: BenArdis HughsD;  Location: [facility];  Service: Urology;  Laterality: Right;    Family History  Problem Relation Age of Onset   COPD Mother    Colon polyps Father    Heart attack Father 72 52Colon polyps Sister    Social History:  reports that he quit smoking about 6 years ago. His smoking use included cigarettes. He has a 17.50 pack-year smoking history. He quit smokeless tobacco use about 53 years ago.  His smokeless tobacco use included chew. He reports that he does not drink alcohol and does not use drugs.  Allergies: No Known Allergies  No medications prior to admission.    Results for orders placed or performed during the hospital encounter of 01/11/22 (from the past 48 hour(s))  CBC per protocol     Status: None   Collection Time: 01/11/22  1:48 PM  Result Value Ref Range   WBC 8.9 4.0 - 10.5 K/uL   RBC 4.80 4.22 - 5.81 MIL/uL   Hemoglobin 14.0 13.0 - 17.0 g/dL   HCT 45.4 39.0 - 52.0 %   MCV 94.6 80.0 - 100.0 fL   MCH 29.2 26.0 - 34.0 pg   MCHC 30.8 30.0 - 36.0 g/dL   RDW 12.0 11.5 - 15.5 %  Platelets 214 150 - 400 K/uL   nRBC 0.0 0.0 - 0.2 %    Comment: Performed at [facility], [place] [address] [place], [place] [address]  Basic metabolic panel per protocol     Status: Abnormal   Collection Time: 01/11/22  1:48 PM  Result Value Ref Range   Sodium 140 135 - 145 mmol/L   Potassium 4.4 3.5 - 5.1 mmol/L   Chloride 95 (L) 98 - 111 mmol/L   CO2 36 (H) 22 - 32 mmol/L   Glucose, Bld 131 (H) 70 - 99 mg/dL    Comment: Glucose reference range applies only to samples taken after fasting for at least 8 hours.   BUN 25 (H) 8 - 23 mg/dL   Creatinine, Ser 0.81 0.61 - 1.24 mg/dL   Calcium 9.6 8.9 - 10.3 mg/dL   GFR, Estimated >60 >60  mL/min    Comment: (NOTE) Calculated using the CKD-EPI Creatinine Equation (2021)    Anion gap 9 5 - 15    Comment: Performed at [facility], [place] [address] [place], [place] [address]   No results found.  Review of Systems  Constitutional:  Negative for chills and fever.  All other systems reviewed and are negative.   There were no vitals taken for this visit. Physical Exam Vitals reviewed.  Constitutional:      Comments: Appears older than stated age. At baseline.   HENT:     Head: Normocephalic.  Eyes:     Pupils: Pupils are equal, round, and reactive to light.  Cardiovascular:     Rate and Rhythm: Normal rate.  Pulmonary:     Comments: Stable increased WOB as per baseline.  Abdominal:     General: Abdomen is flat.  Genitourinary:    Comments: No CVAT at present.  Musculoskeletal:        General: Normal range of motion.     Cervical back: Normal range of motion.  Neurological:     General: No focal deficit present.     Mental Status: He is alert.  Psychiatric:        Mood and Affect: Mood normal.      Assessment/Plan  Proceed as planned with RIGHT ureteroscopc stone maniuplation + cystolithalopexy with goal of preserving Rt renal funciton and improving lower urinary tract function. Risks (incluidng pulm failure / exacerbation), benefits, alternatives, expected peri-op course discussed previously and reiterated today.   Frank Frock, MD 01/13/2022, 7:11 AM

## 2022-01-13 NOTE — Discharge Instructions (Addendum)
1 - You may have urinary urgency (bladder spasms) and bloody urine on / off with catheter in place. This is normal. ° °2 - Call MD or go to ER for fever >102, severe pain / nausea / vomiting not relieved by medications, or acute change in medical status ° °

## 2022-01-14 ENCOUNTER — Encounter (HOSPITAL_COMMUNITY): Payer: Self-pay | Admitting: Urology

## 2022-01-16 NOTE — Op Note (Signed)
NAME: ADIN, LAKER MEDICAL RECORD NO: [id number] ACCOUNT NO: [id number] DATE OF BIRTH: 11-Jul-1955 FACILITY: [facility] LOCATION: [facility] PHYSICIAN: Alexis Frock, MD  Operative Report   DATE OF PROCEDURE: 01/13/2022  PREOPERATIVE DIAGNOSES:  Right ureteral stone, large bladder stones, metastatic prostate cancer.  PROCEDURES PERFORMED:  1.  Cystoscopy, Right retrograde pyelogram and interpretation. 2.  Right ureteroscopy with laser lithotripsy. 3.  Insertion of right ureteral stent. 4.  Cystolitholapaxy stone greater than 2.5 cm.  FINDINGS:  1.  Right mid ureteral stone with moderate hydronephrosis. 2.  Approximately 4cm2 total volume bladder stones. 3.  Complete resolution of all accessible stone fragments larger than one-third mm following laser lithotripsy and basket extraction. 4.  Placement of ureteral stent, proximal end in the renal pelvis, distal end in urinary bladder with tether to the catheter.  DRAINS:  1.  Foley catheter to straight drain    INDICATIONS:  This is a pleasant unfortunate 66 year old male with recent diagnosis of metastatic prostate cancer.  He was found to have on staging of this to have a right mid ureteral stone with moderate hydronephrosis as well as fairly large volume  bladder stone.  He does have some irritative voiding with postvoid residual at baseline.  Options discussed for management including surveillance with medical therapy versus recommended path of endoscopic management with cystolitholapaxy and right  ureteroscopy with goal of stone free to help minimize any renal functional decline on his right side to help with residual voiding.  He wished to proceed.  He is on O2 at baseline with poor baseline pulmonary function.  He understands the risks.  Informed consent was  obtained and placed in medical record.  PROCEDURE IN DETAIL:  The patient being himself verified, procedure being right ureteroscopic stone maniulation, cystolitholapaxy was  confirmed.  Procedure timeout was performed.  Intravenous antibiotics were administered and general LMA anesthesia was induced.   The patient was placed into a low lithotomy position.  Sterile field was created, prepped and draped the patient's penis, perineum, and proximal thighs using iodine.  Cystourethroscopy was performed using 21-French rigid cystoscope.  On inspection  of anterior and posterior urethra, there was some modest bilobar prostatic hypertrophy with slightly elevated bladder neck, not severe.  Inspection of urinary bladder revealed some mild trabeculation with a significant volume bladder stones approximately  4cm2 total volume . Right ureteral orifice was cannulated with 6-French end-hole catheter, and right retrograde pyelogram was obtained.  Right retrograde pyelogram demonstrates single right ureter, single system right kidney.  There was a filling defect in the midureter consistent with stone with known stone.  A 0.038 ZIPwire was advanced to lower pole, set aside as a safety wire.  An 8-French  feeding tube placed in the urinary bladder for pressure release, and semirigid ureteroscopy was performed in the distal half of the right ureter alongside a separate sensor working wire.  At the upper reaches of the semirigid scope, the large ureteral stone in  question was encountered.  It was much too large for simple basketing.  Given that the upper reaches of the semirigid scope, it was felt that flexible directable sheath will be most efficient and safe.  As such, the semirigid scope was exchanged for a  12/14 short length ureteral access sheath to the level of the distal third of the ureter using continuous fluoroscopic guidance and flexible digital ureteroscopy was performed .  This allowed excellent visualization of the stone in question.   Holmium laser energy then applied using 0.2 joules  and 30 Hz.  Approximately 60% of the stone was dusted, 40% fragmented.  The fragments being  amenable to simple basketing with the Escape basket and fragments were removed, set aside for composition analysis.   Access sheath removed under continuous vision.  No significant mucosal abnormalities were found.   Next, cystourethroscopy was performed with a 26-French resectoscope sheath with visual obturator.  This allowed continuous flow and visualization of  the bladder and the laser type bridge was inserted to allow continuous flow and holmium laser energy applied to the large bladder stones using settings of 1 joule and 20 Hz with a 500 nanometer fiber using approximately 50% of the stone dusted into fragments  and the fragments being amenable to irritaion with the  resectoscope sheath. Hemostasis was excellent.  No evidence of perforation.  Given his large prostate in baseline obstruct symptoms , it was felt that most  prudent means of management would be brief interval catheterization to allow bladder rest, followed by a trial of void in the office next week. A 5 x 24 stent was placed over the safety wire using fluoroscopic guidance Good proximal and distal deploment were noted.  Tether was left in place, 18-French catheter was placed .  Procedure was terminated.  The patient tolerated procedure well.     PAA/JAY D: 01/13/2022 6:38:48 pm T: 01/14/2022 12:29:00 am  JOB: [id number]/ [id number]

## 2022-01-17 DIAGNOSIS — R3911 Hesitancy of micturition: Secondary | ICD-10-CM | POA: Diagnosis not present

## 2022-01-17 DIAGNOSIS — N202 Calculus of kidney with calculus of ureter: Secondary | ICD-10-CM | POA: Diagnosis not present

## 2022-01-17 DIAGNOSIS — C7951 Secondary malignant neoplasm of bone: Secondary | ICD-10-CM | POA: Diagnosis not present

## 2022-01-17 DIAGNOSIS — C61 Malignant neoplasm of prostate: Secondary | ICD-10-CM | POA: Diagnosis not present

## 2022-01-21 ENCOUNTER — Other Ambulatory Visit: Payer: Self-pay | Admitting: Emergency Medicine

## 2022-01-30 DIAGNOSIS — C61 Malignant neoplasm of prostate: Secondary | ICD-10-CM | POA: Diagnosis not present

## 2022-01-30 DIAGNOSIS — R3911 Hesitancy of micturition: Secondary | ICD-10-CM | POA: Diagnosis not present

## 2022-01-30 DIAGNOSIS — C7951 Secondary malignant neoplasm of bone: Secondary | ICD-10-CM | POA: Diagnosis not present

## 2022-01-30 DIAGNOSIS — N202 Calculus of kidney with calculus of ureter: Secondary | ICD-10-CM | POA: Diagnosis not present

## 2022-02-01 DIAGNOSIS — J449 Chronic obstructive pulmonary disease, unspecified: Secondary | ICD-10-CM | POA: Diagnosis not present

## 2022-02-04 DIAGNOSIS — J449 Chronic obstructive pulmonary disease, unspecified: Secondary | ICD-10-CM | POA: Diagnosis not present

## 2022-02-04 DIAGNOSIS — I4819 Other persistent atrial fibrillation: Secondary | ICD-10-CM | POA: Diagnosis not present

## 2022-02-13 ENCOUNTER — Telehealth: Payer: Self-pay

## 2022-02-13 NOTE — Patient Outreach (Signed)
  Care Coordination   02/13/2022 Name: Frank [name] MRN: [id number] DOB: 20-Jan-1956   Care Coordination Outreach Attempts:  An unsuccessful telephone outreach was attempted today to offer the patient information about available care coordination services as a benefit of their health plan.   Follow Up Plan:  Additional outreach attempts will be made to offer the patient care coordination information and services.   Encounter Outcome:  No Answer  Care Coordination Interventions Activated:  No   Care Coordination Interventions:  No, not indicated    Johnney Killian, RN, BSN, CCM Care Management Coordinator [facility] Phone: [phone]: [phone]

## 2022-02-17 ENCOUNTER — Other Ambulatory Visit: Payer: Self-pay | Admitting: Internal Medicine

## 2022-02-17 DIAGNOSIS — N401 Enlarged prostate with lower urinary tract symptoms: Secondary | ICD-10-CM

## 2022-03-03 DIAGNOSIS — J449 Chronic obstructive pulmonary disease, unspecified: Secondary | ICD-10-CM | POA: Diagnosis not present

## 2022-03-07 ENCOUNTER — Telehealth: Payer: Self-pay | Admitting: Emergency Medicine

## 2022-03-07 ENCOUNTER — Other Ambulatory Visit: Payer: Self-pay

## 2022-03-07 DIAGNOSIS — J449 Chronic obstructive pulmonary disease, unspecified: Secondary | ICD-10-CM | POA: Diagnosis not present

## 2022-03-07 MED ORDER — ALBUTEROL SULFATE HFA 108 (90 BASE) MCG/ACT IN AERS: INHALATION_SPRAY | RESPIRATORY_TRACT | 5 refills | Status: DC

## 2022-03-07 MED ORDER — ALBUTEROL SULFATE 0.63 MG/3ML IN NEBU: INHALATION_SOLUTION | RESPIRATORY_TRACT | 5 refills | Status: DC

## 2022-03-07 NOTE — Telephone Encounter (Signed)
Called Pt to verify what pharmacy to send Albuterol to and let Pt know that it was sent. Nothing further needed.

## 2022-04-03 ENCOUNTER — Encounter: Payer: Medicare Other | Admitting: Student

## 2022-04-03 DIAGNOSIS — C61 Malignant neoplasm of prostate: Secondary | ICD-10-CM | POA: Diagnosis not present

## 2022-04-03 DIAGNOSIS — J449 Chronic obstructive pulmonary disease, unspecified: Secondary | ICD-10-CM | POA: Diagnosis not present

## 2022-04-06 DIAGNOSIS — J449 Chronic obstructive pulmonary disease, unspecified: Secondary | ICD-10-CM | POA: Diagnosis not present

## 2022-04-06 DIAGNOSIS — Z9981 Dependence on supplemental oxygen: Secondary | ICD-10-CM | POA: Diagnosis not present

## 2022-04-10 DIAGNOSIS — R3911 Hesitancy of micturition: Secondary | ICD-10-CM | POA: Diagnosis not present

## 2022-04-10 DIAGNOSIS — C61 Malignant neoplasm of prostate: Secondary | ICD-10-CM | POA: Diagnosis not present

## 2022-04-10 DIAGNOSIS — N202 Calculus of kidney with calculus of ureter: Secondary | ICD-10-CM | POA: Diagnosis not present

## 2022-04-10 DIAGNOSIS — C7951 Secondary malignant neoplasm of bone: Secondary | ICD-10-CM | POA: Diagnosis not present

## 2022-04-11 ENCOUNTER — Other Ambulatory Visit: Payer: Self-pay | Admitting: Pulmonary Disease

## 2022-04-21 ENCOUNTER — Other Ambulatory Visit: Payer: Self-pay | Admitting: Emergency Medicine

## 2022-05-03 DIAGNOSIS — J449 Chronic obstructive pulmonary disease, unspecified: Secondary | ICD-10-CM | POA: Diagnosis not present

## 2022-05-06 DIAGNOSIS — Z9981 Dependence on supplemental oxygen: Secondary | ICD-10-CM | POA: Diagnosis not present

## 2022-05-06 DIAGNOSIS — J449 Chronic obstructive pulmonary disease, unspecified: Secondary | ICD-10-CM | POA: Diagnosis not present

## 2022-05-23 NOTE — Progress Notes (Incomplete)
   CC: ***  HPI:   [name] is a 67 y.o. male with a past medical history of COPD, emphysema, and tobacco use who presents for routine follow-up. He was last seen at [facility] in 06-2021.   CANCER Patient with history of BPH not on medications who presents to the [facility] today for 1 month of difficulties urinating. He states that he feels as if he cannot completely empty his bladder, he is urinating frequently, his stream stops/starts intermittently and he generally has a weak urine stream. He denies any dysuria, hematuria, or flank plan. AUA score of 16 (moderate symptom burden). On exam, the patient has an enlarged prostate palpated, with no nodularities or irregularities appreciated.  Plan: - PSA today, may need urology referral based on results - BMP today  - Start flomax 0.4 mg daily (counseled on symptomatic orthostatic hypotension side effect) Addendum: PSA significantly elevated to 348. Called and discussed this with the patient and that there is a possibility that he could have prostate cancer, although we cannot know for sure without a biopsy. Placed referral to urology. Patient understands and agrees with plan.   Xxxx   COPD Patient is on chronic systemic steroid therapy for his COPD. Will check an A1c today- resulted at 5.6.    Xxx   Past Medical History:  Diagnosis Date   Arthritis    Cancer ([place])    COPD (chronic obstructive pulmonary disease) ([place]) 06/05/2012   Dyspnea    Dysrhythmia    Emphysema 06/07/2012   Heart murmur    hx of as baby   History of kidney stones    Hypoxemia 06/07/2012   Obstructive chronic bronchitis with exacerbation ([place]) 06/07/2012   Pneumonia    Tobacco abuse 06/05/2012     Review of Systems:    Reports *** Denies *** (subjective fever?, pain anywhere?, bowel changes?)   Physical Exam:  There were no vitals filed for this visit.  General:   awake and alert, sitting comfortably in chair, cooperative, not in acute  distress Skin:   warm and dry, intact without any obvious lesions or scars, no rashes or lesions  Head:   normocephalic and atraumatic, oral mucosa moist with good dentition, no lymphadenopathy Eyes:   extraocular movements intact, conjunctivae pink, pupils round and reactive to light, no periorbital swelling or scleral icterus Ears:   pinnae normal, no discharge or external lesions  Nose:   symmetrical and without mucosal inflammation, no external lesions or discharge Lungs:   normal respiratory effort, breathing unlabored, symmetrical chest rise, no crackles or wheezing Cardiac:   regular rate and rhythm, normal S1 and S2, capillary refill 2-3 seconds, dorsalis pedis pulses intact bilaterally, no pitting edema Abdomen:   soft and non-distended, normoactive bowel sounds present in all four quadrants, no guarding or palpable masses Musculoskeletal:   full range of motion in joints, motor strength 5 /5 in all four extremities, no obvious deformities or joint tenderness Neurologic:   oriented to person-place-time, moving all extremities, sensation to light touch intact, no facial droop Psychiatric:   euthymic mood with congruent affect, intelligible speech    Assessment & Plan:   No problem-specific Assessment & Plan notes found for this encounter.     See Encounters Tab for problem based charting.  Patient {GC/GE:[id number]::"discussed with","seen with"} Dr. {NAMES:[id number]::"Guilloud","Hoffman","Mullen","Narendra","Williams","Vincent"}

## 2022-05-25 ENCOUNTER — Encounter: Payer: Medicare Other | Admitting: Student

## 2022-05-25 ENCOUNTER — Ambulatory Visit: Payer: Medicare Other

## 2022-06-03 DIAGNOSIS — J449 Chronic obstructive pulmonary disease, unspecified: Secondary | ICD-10-CM | POA: Diagnosis not present

## 2022-06-05 DIAGNOSIS — Z9981 Dependence on supplemental oxygen: Secondary | ICD-10-CM | POA: Diagnosis not present

## 2022-06-05 DIAGNOSIS — J449 Chronic obstructive pulmonary disease, unspecified: Secondary | ICD-10-CM | POA: Diagnosis not present

## 2022-06-07 DIAGNOSIS — J449 Chronic obstructive pulmonary disease, unspecified: Secondary | ICD-10-CM | POA: Diagnosis not present

## 2022-06-07 DIAGNOSIS — Z9981 Dependence on supplemental oxygen: Secondary | ICD-10-CM | POA: Diagnosis not present

## 2022-07-02 ENCOUNTER — Other Ambulatory Visit: Payer: Self-pay | Admitting: Pulmonary Disease

## 2022-07-02 ENCOUNTER — Other Ambulatory Visit: Payer: Self-pay | Admitting: Emergency Medicine

## 2022-07-04 DIAGNOSIS — J449 Chronic obstructive pulmonary disease, unspecified: Secondary | ICD-10-CM | POA: Diagnosis not present

## 2022-07-06 DIAGNOSIS — J449 Chronic obstructive pulmonary disease, unspecified: Secondary | ICD-10-CM | POA: Diagnosis not present

## 2022-07-06 DIAGNOSIS — Z9981 Dependence on supplemental oxygen: Secondary | ICD-10-CM | POA: Diagnosis not present

## 2022-07-06 DIAGNOSIS — C61 Malignant neoplasm of prostate: Secondary | ICD-10-CM | POA: Diagnosis not present

## 2022-07-09 NOTE — Progress Notes (Incomplete)
   CC: Routine Follow-up  HPI:   [name] is a 67 y.o. male with a past medical history of tobacco use, emphysema, and COPD who presents for routine follow-up. He was last seen at [facility] in 06-2021.    Past Medical History:  Diagnosis Date   Arthritis    Cancer ([place])    COPD (chronic obstructive pulmonary disease) ([place]) 06/05/2012   Dyspnea    Dysrhythmia    Emphysema 06/07/2012   Heart murmur    hx of as baby   History of kidney stones    Hypoxemia 06/07/2012   Obstructive chronic bronchitis with exacerbation ([place]) 06/07/2012   Pneumonia    Tobacco abuse 06/05/2012     Review of Systems:    Reports urinary frequency Denies recent illnesses, fever, chills, sweats, chest pain, syncope, falls   Physical Exam:  Vitals:   07/17/22 1324 07/17/22 1400  BP: (!) 144/70 125/65  Pulse: 85 80  Temp: 98.6 F (37 C)   TempSrc: Oral   SpO2: 95%   Weight: 140 lb 4.8 oz (63.6 kg)     General:   awake and alert, sitting comfortably in chair, cooperative, not in acute distress Eyes:   extraocular movements intact, conjunctivae pink, pupils round Lungs:   normal respiratory effort, breathing slightly labored on 4L/min supplemental oxygen, symmetrical chest rise, distant breath sounds throughout, mild expiratory wheezing, no crackles Cardiac:   regular rate and rhythm, normal S1 and S2 Neurologic:   oriented to person-place-time, moving all extremities, no gross focal deficits Psychiatric:   euthymic mood with congruent affect, intelligible speech    Assessment & Plan:   Prostate cancer metastatic to bone [facility]) Patient initially thought to have BPH with AUA index 16 obtained in 06-2021. At that time, he was started on tamsulosin and referred to urology for elevated PSA level. Diagnosed with prostate cancer, grade V, via biopsy performed in 11-2021 and is currently undergoing androgen suppression therapy. Bone metastasis identified in 12-2021. Current care goals are focused on  symptom control and quality of life. In 01-2022, the patient underwent a cystoscopy with litholapaxy. Since that procedure, he reports significant improvement in all of his urinary symptoms. Sometimes experiences urinary frequency, AUA index now 5. Continues to take tamsulosin daily and reports occasional lightheadedness when standing up quickly. Denies syncope and any falls.   - Continue tamsulosin 0.'4mg'$  q24 - Continue to follow with urology and oncology    COPD (chronic obstructive pulmonary disease) ([place]) Patient has history of COPD plus emphysema managed with fluticasone-umeclidinium-vilanterol and prednisone daily. Reports strong adherence to these medications. He uses supplemental oxygen with flow rate of 4L/min at home. Has been using albuterol rescue nearly every day, sometimes 1-2 times, and less frequently 3-4 times. Over the last few weeks, he has been experiencing a mild cough that he attributes to seasonal allergies. Denies recent illnesses, chills, dyspnea, wheezing, and chest pain. Exam demonstrated distant breath sounds with some expiratory wheezing. Currently follows with pulmonologist Dr Lamonte Sakai.  - Continue fluticasone-umeclidinium-vilanterol 100-62.5-25ug - Continue to follow with pulmonology    Healthcare maintenance Patient is due for DTaP vaccine and expressed an interest in receiving it. Given his metastatic prostate cancer, colon cancer screening is unwarranted.  - Administer DTaP vaccine      See Encounters Tab for problem based charting.  Patient discussed with Dr. Evette Doffing

## 2022-07-13 DIAGNOSIS — N202 Calculus of kidney with calculus of ureter: Secondary | ICD-10-CM | POA: Diagnosis not present

## 2022-07-13 DIAGNOSIS — R3911 Hesitancy of micturition: Secondary | ICD-10-CM | POA: Diagnosis not present

## 2022-07-13 DIAGNOSIS — C7951 Secondary malignant neoplasm of bone: Secondary | ICD-10-CM | POA: Diagnosis not present

## 2022-07-13 DIAGNOSIS — C61 Malignant neoplasm of prostate: Secondary | ICD-10-CM | POA: Diagnosis not present

## 2022-07-17 ENCOUNTER — Ambulatory Visit (INDEPENDENT_AMBULATORY_CARE_PROVIDER_SITE_OTHER): Payer: Medicare Other | Admitting: *Deleted

## 2022-07-17 ENCOUNTER — Ambulatory Visit (INDEPENDENT_AMBULATORY_CARE_PROVIDER_SITE_OTHER): Payer: Medicare Other | Admitting: Student

## 2022-07-17 ENCOUNTER — Encounter: Payer: Self-pay | Admitting: Student

## 2022-07-17 VITALS — BP 125/65 | HR 80 | Temp 98.6°F | Wt 140.3 lb

## 2022-07-17 DIAGNOSIS — Z87891 Personal history of nicotine dependence: Secondary | ICD-10-CM | POA: Diagnosis not present

## 2022-07-17 DIAGNOSIS — Z0000 Encounter for general adult medical examination without abnormal findings: Secondary | ICD-10-CM

## 2022-07-17 DIAGNOSIS — J449 Chronic obstructive pulmonary disease, unspecified: Secondary | ICD-10-CM

## 2022-07-17 DIAGNOSIS — C61 Malignant neoplasm of prostate: Secondary | ICD-10-CM

## 2022-07-17 DIAGNOSIS — C7951 Secondary malignant neoplasm of bone: Secondary | ICD-10-CM | POA: Diagnosis not present

## 2022-07-17 NOTE — Assessment & Plan Note (Addendum)
Patient initially thought to have BPH with AUA index 16 obtained in 06-2021. At that time, he was started on tamsulosin and referred to urology for elevated PSA level. Diagnosed with prostate cancer, grade V, via biopsy performed in 11-2021 and is currently undergoing androgen suppression therapy. Bone metastasis identified in 12-2021. Current care goals are focused on symptom control and quality of life. In 01-2022, the patient underwent a cystoscopy with litholapaxy. Since that procedure, he reports significant improvement in all of his urinary symptoms. Sometimes experiences urinary frequency, AUA index now 5. Continues to take tamsulosin daily and reports occasional lightheadedness when standing up quickly. Denies syncope and any falls.   - Continue tamsulosin 0.'4mg'$  q24 - Continue to follow with urology and oncology

## 2022-07-17 NOTE — Assessment & Plan Note (Signed)
Patient has history of COPD plus emphysema managed with fluticasone-umeclidinium-vilanterol and prednisone daily. Reports strong adherence to these medications. He uses supplemental oxygen with flow rate of 4L/min at home. Has been using albuterol rescue nearly every day, sometimes 1-2 times, and less frequently 3-4 times. Over the last few weeks, he has been experiencing a mild cough that he attributes to seasonal allergies. Denies recent illnesses, chills, dyspnea, wheezing, and chest pain. Exam demonstrated distant breath sounds with some expiratory wheezing. Currently follows with pulmonologist Dr Lamonte Sakai.  - Continue fluticasone-umeclidinium-vilanterol 100-62.5-25ug - Continue to follow with pulmonology

## 2022-07-17 NOTE — Patient Instructions (Signed)
  Thank you, Frank [name], for allowing [place] to provide your care today. Today we discussed . . .  > Prostate Cancer       - continue to take your tamsulosin and stand up slowly to minimize your side effect of lightheadedness        - continue to follow with your urology and oncology teams > COPD       - continue to take your Trelegy and prednisone       - please schedule a visit with your pulmonologist     I have ordered the following labs for you:  Lab Orders  No laboratory test(s) ordered today      Tests ordered today:  none   Referrals ordered today:   Referral Orders  No referral(s) requested today      I have ordered the following medication/changed the following medications:   Stop the following medications: There are no discontinued medications.   Start the following medications: No orders of the defined types were placed in this encounter.     Follow up: 6 months    Remember:  Please continue to take your medications and follow-up with your specialists. Let us know if you need anything in the meantime and we will see you again in about six months!   Should you have any questions or concerns please call the internal medicine clinic at [phone].     Roswell Nickel, MD [facility]

## 2022-07-17 NOTE — Progress Notes (Signed)
Subjective:   Frank [name] is a 67 y.o. male who presents for Medicare Annual/Subsequent preventive examination.  Review of Systems    Defer to pcp        Objective:    Today's Vitals   07/17/22 1324 07/17/22 1400  BP: (!) 144/70 125/65  Pulse: 85 80  Temp: 98.6 F (37 C)   TempSrc: Oral   SpO2: 95%   Weight: 140 lb 4.8 oz (63.6 kg)    Body mass index is 22.65 kg/m.     07/17/2022    3:56 PM 07/17/2022    1:23 PM 01/11/2022    1:30 PM 06/30/2021    1:13 PM 12/20/2020   10:10 AM 11/15/2020    1:21 PM 02/28/2020    8:00 PM  Advanced Directives  Does Patient Have a Medical Advance Directive? Yes Yes Yes Yes Yes No;Yes Yes  Type of Paramedic of [place];Living will Living will;Healthcare Power of Attorney Living will;Healthcare Power of [place];Living will Living will [place];Living will Living will  Does patient want to make changes to medical advance directive? No - Patient declined No - Patient declined  No - Guardian declined No - Patient declined  No - Patient declined  Copy of [place] in Chart? No - copy requested No - copy requested  Yes - validated most recent copy scanned in chart (See row information) No - copy requested No - copy requested   Would patient like information on creating a medical advance directive? No - Patient declined No - Patient declined   No - Patient declined No - Patient declined     Current Medications (verified) Outpatient Encounter Medications as of 07/17/2022  Medication Sig   albuterol (ACCUNEB) 0.63 MG/3ML nebulizer solution USE 1 VIAL IN NEBULIZER EVERY 6 HOURS   albuterol (VENTOLIN HFA) 108 (90 Base) MCG/ACT inhaler INHALE 2 PUFFS BY MOUTH EVERY 4 HOURS AS NEEDED FOR WHEEZING OR SHORTNESS OF BREATH   fluticasone (FLONASE) 50 MCG/ACT nasal spray Place 1 spray into both nostrils daily.   Fluticasone-Umeclidin-Vilant (TRELEGY ELLIPTA)  100-62.5-25 MCG/ACT AEPB INHALE 1 PUFF INTO THE LUNGS DAILY   guaiFENesin (MUCINEX) 600 MG 12 hr tablet Take 600 mg by mouth 2 (two) times daily as needed for to loosen phlegm or cough.   naproxen sodium (ALEVE) 220 MG tablet Take 220 mg by mouth daily as needed (pain).   oxyCODONE-acetaminophen (PERCOCET) 5-325 MG tablet Take 1 tablet by mouth every 6 (six) hours as needed for severe pain or moderate pain (post-operatively).   OXYGEN Inhale 4 L/min into the lungs continuous.    predniSONE (DELTASONE) 10 MG tablet TAKE 1 TABLET(10 MG) BY MOUTH DAILY WITH BREAKFAST   predniSONE (DELTASONE) 5 MG tablet Take 5 mg by mouth daily with breakfast.   tamsulosin (FLOMAX) 0.4 MG CAPS capsule TAKE 1 CAPSULE(0.4 MG) BY MOUTH DAILY   No facility-administered encounter medications on file as of 07/17/2022.    Allergies (verified) Patient has no known allergies.   History: Past Medical History:  Diagnosis Date   Arthritis    Cancer ([place])    COPD (chronic obstructive pulmonary disease) ([place]) 06/05/2012   Dyspnea    Dysrhythmia    Emphysema 06/07/2012   Heart murmur    hx of as baby   History of kidney stones    Hypoxemia 06/07/2012   Obstructive chronic bronchitis with exacerbation ([place]) 06/07/2012   Pneumonia    Tobacco abuse  06/05/2012   Past Surgical History:  Procedure Laterality Date   CATARACT EXTRACTION, BILATERAL     CYSTOSCOPY WITH LITHOLAPAXY N/A 01/13/2022   Procedure: CYSTOSCOPY WITH LITHOLAPAXY;  Surgeon: Alexis Frock, MD;  Location: [facility];  Service: Urology;  Laterality: N/A;   CYSTOSCOPY/RETROGRADE/URETEROSCOPY/STONE EXTRACTION WITH BASKET Right 10/04/2016   Procedure: CYSTOSCOPY/RETROGRADE/URETEROSCOPY/STONE EXTRACTION WITH BASKET/ STENT;  Surgeon: Alexis Frock, MD;  Location: [facility];  Service: Urology;  Laterality: Right;  With LASER   CYSTOSCOPY/URETEROSCOPY/HOLMIUM LASER/STENT PLACEMENT Right 01/13/2022   Procedure: CYSTOSCOPY/URETEROSCOPY/HOLMIUM LASER/STENT PLACEMENT;   Surgeon: Alexis Frock, MD;  Location: [facility];  Service: Urology;  Laterality: Right;   EXTRACORPOREAL SHOCK WAVE LITHOTRIPSY Right 06/29/2016   Procedure: RIGHT EXTRACORPOREAL SHOCK WAVE LITHOTRIPSY (ESWL);  Surgeon: Ardis Hughs, MD;  Location: [facility];  Service: Urology;  Laterality: Right;   Family History  Problem Relation Age of Onset   COPD Mother    Colon polyps Father    Heart attack Father 13   Colon polyps Sister    Social History   Socioeconomic History   Marital status: Single    Spouse name: Not on file   Number of children: 0   Years of education: Not on file   Highest education level: 9th grade  Occupational History   Not on file  Tobacco Use   Smoking status: Former    Packs/day: 0.50    Years: 35.00    Total pack years: 17.50    Types: Cigarettes    Quit date: 05/11/2015    Years since quitting: 7.1   Smokeless tobacco: Former    Types: Chew    Quit date: 05/08/1968  Vaping Use   Vaping Use: Former   Quit date: 10/05/2015  Substance and Sexual Activity   Alcohol use: No   Drug use: No   Sexual activity: Not Currently  Other Topics Concern   Not on file  Social History Narrative   Current Social History 12/20/2020        Patient lives alone in a home which is 2 stories. There is one step up to the entrance the patient uses.       Patient's method of transportation is via family member (sister)      The highest level of education was some high school.      The patient currently retired.      Identified important Relationships are "my sisters"      Pets : 0       Interests / Fun: "Play games and watch TV"       Current Stressors: "none"       Religious / Personal Beliefs: Baptist    Social Determinants of Health   Financial Resource Strain: Low Risk  (07/17/2022)   Overall Financial Resource Strain (CARDIA)    Difficulty of Paying Living Expenses: Not very hard  Food Insecurity: No Food Insecurity (07/17/2022)   Hunger Vital Sign     Worried About Running Out of Food in the Last Year: Never true    Ran Out of Food in the Last Year: Never true  Transportation Needs: Unmet Transportation Needs (07/17/2022)   PRAPARE - Hydrologist (Medical): Yes    Lack of Transportation (Non-Medical): No  Physical Activity: Inactive (07/17/2022)   Exercise Vital Sign    Days of Exercise per Week: 0 days    Minutes of Exercise per Session: 0 min  Stress: No Stress Concern Present (07/17/2022)   [employer]  Occupational Health - Occupational Stress Questionnaire    Feeling of Stress : Not at all  Social Connections: Socially Isolated (07/17/2022)   Social Connection and Isolation Panel [NHANES]    Frequency of Communication with Friends and Family: More than three times a week    Frequency of Social Gatherings with Friends and Family: Three times a week    Attends Religious Services: Never    Active Member of Clubs or Organizations: No    Attends Archivist Meetings: Never    Marital Status: Never married    Tobacco Counseling Counseling given: Not Answered   Clinical Intake:  Pre-visit preparation completed: Yes  Pain : No/denies pain     Nutritional Risks: None Diabetes: No     Diabetic?no         Activities of Daily Living    07/17/2022    1:48 PM 07/17/2022    1:23 PM  In your present state of health, do you have any difficulty performing the following activities:  Hearing? 0 0  Vision? 0 0  Difficulty concentrating or making decisions? 0 0  Walking or climbing stairs? 1 1  Dressing or bathing? 1 1  Doing errands, shopping? 1 1    Patient Care Team: Serita Butcher, MD as PCP - [place], [facility] as Registered Nurse Lamonte Sakai, Rose Fillers, MD as Consulting Physician (Pulmonary Disease)  Indicate any recent Medical Services you may have received from other than [facility] providers in the past year (date may be approximate).     Assessment:   This is a  routine wellness examination for [facility].  Hearing/Vision screen No results found.  Dietary issues and exercise activities discussed:     Goals Addressed   None   Depression Screen    07/17/2022    1:48 PM 06/30/2021    1:12 PM 12/20/2020   10:14 AM 11/15/2020    2:52 PM 07/08/2019    1:55 PM 10/23/2016    2:21 PM 06/09/2016    2:51 PM  PHQ 2/9 Scores  PHQ - 2 Score 0 0 '1 3 1 '$ 0 0  PHQ- 9 Score 1 0  6 7      Fall Risk    07/17/2022    1:47 PM 07/17/2022    1:22 PM 06/30/2021    1:12 PM 12/20/2020   10:10 AM 11/15/2020    1:20 PM  [place] in the past year? 0 0 0 0 1  Number falls in past yr: 0 0 0 0 1  Injury with Fall? 0 0 0 0   Risk for fall due to : Other (Comment);Impaired balance/gait Other (Comment) No Fall Risks No Fall Risks Impaired balance/gait  Risk for fall due to: Comment COPD on home O2 home O2     Follow up Falls evaluation completed Falls evaluation completed Falls evaluation completed;Falls prevention discussed Falls evaluation completed;Falls prevention discussed Falls prevention discussed    FALL RISK PREVENTION PERTAINING TO THE HOME:  Any stairs in or around the home? Yes  1 step going into home If so, are there any without handrails? Yes  Home free of loose throw rugs in walkways, pet beds, electrical cords, etc? Yes  Adequate lighting in your home to reduce risk of falls? Yes   ASSISTIVE DEVICES UTILIZED TO PREVENT FALLS:  Life alert? No  Use of a cane, walker or w/c? Yes  Grab bars in the bathroom? No  Shower chair or bench in shower? Yes  Elevated toilet seat or a handicapped toilet? No   TIMED UP AND GO:  Was the test performed? No .  Length of time to ambulate 10 feet: 0 sec.   Gait slow and steady with assistive device  Cognitive Function:        07/17/2022    3:55 PM 12/20/2020   10:14 AM  6CIT Screen  What Year? 0 points 0 points  What month? 0 points 0 points  What time? 0 points 0 points  Count back from 20 2 points 0  points  Months in reverse 4 points 2 points  Repeat phrase 0 points 0 points  Total Score 6 points 2 points    Immunizations Immunization History  Administered Date(s) Administered   Fluad Quad(high Dose 65+) 02/24/2020   Influenza Inj Mdck Quad Pf 02/07/2017   Influenza Split 06/07/2012, 02/05/2017   Influenza, High Dose Seasonal PF 03/18/2019   Influenza, Quadrivalent, Recombinant, Inj, Pf 02/05/2018   Influenza,inj,Quad PF,6+ Mos 01/29/2013, 02/04/2016, 03/07/2016   Influenza-Unspecified 01/19/2015, 02/07/2017   PFIZER(Purple Top)SARS-COV-2 Vaccination 08/03/2019, 08/24/2019   Pneumococcal Conjugate-13 11/13/2013   Pneumococcal Polysaccharide-23 06/07/2012, 07/08/2019   Tdap 06/18/2012    TDAP status: Due, Education has been provided regarding the importance of this vaccine. Advised may receive this vaccine at local pharmacy or Health Dept. Aware to provide a copy of the vaccination record if obtained from local pharmacy or Health Dept. Verbalized acceptance and understanding.  Covid-19 vaccine status: Completed vaccines  Pneumococcal vaccine status: Completed during today's visit.  Covid-19 vaccine status: Completed vaccines  Qualifies for Shingles Vaccine? Yes   Zostavax completed No   Shingrix Completed?: No.    Education has been provided regarding the importance of this vaccine. Patient has been advised to call insurance company to determine out of pocket expense if they have not yet received this vaccine. Advised may also receive vaccine at local pharmacy or Health Dept. Verbalized acceptance and understanding.  Screening Tests Health Maintenance  Topic Date Due   Zoster Vaccines- Shingrix (1 of 2) Never done   COLON CANCER SCREENING ANNUAL FOBT  06/19/2015   COLONOSCOPY (Pts 45-77yr Insurance coverage will need to be confirmed)  10/10/2015   COVID-19 Vaccine (3 - Pfizer risk series) 09/21/2019   INFLUENZA VACCINE  12/06/2021   DTaP/Tdap/Td (2 - Td or Tdap)  06/18/2022   Medicare Annual Wellness (AWV)  07/17/2023   Pneumonia Vaccine 67 Years old  Completed   Hepatitis C Screening  Completed   HPV VACCINES  Aged Out    Health Maintenance  Health Maintenance Due  Topic Date Due   Zoster Vaccines- Shingrix (1 of 2) Never done   COLON CANCER SCREENING ANNUAL FOBT  06/19/2015   COLONOSCOPY (Pts 45-461yrInsurance coverage will need to be confirmed)  10/10/2015   COVID-19 Vaccine (3 - Pfizer risk series) 09/21/2019   INFLUENZA VACCINE  12/06/2021   DTaP/Tdap/Td (2 - Td or Tdap) 06/18/2022    Colorectal cancer screening: No longer required.   Lung Cancer Screening: (Low Dose CT Chest recommended if Age 69-80ears, 30 pack-year currently smoking OR have quit w/in 15years.) does not qualify.   Lung Cancer Screening Referral: n/a  Additional Screening:  Hepatitis C Screening: does not qualify; Completed 06/09/2016   Vision Screening: Recommended annual ophthalmology exams for early detection of glaucoma and other disorders of the eye. Is the patient up to date with their annual eye exam?  No  Who is the provider or what is the name of  the office in which the patient attends annual eye exams? Dr Venetia Maxon If pt is not established with a provider, would they like to be referred to a provider to establish care?  N/a .   Dental Screening: Recommended annual dental exams for proper oral hygiene  Community Resource Referral / Chronic Care Management: CRR required this visit?  No   CCM required this visit?  No      Plan:     I have personally reviewed and noted the following in the patient's chart:   Medical and social history Use of alcohol, tobacco or illicit drugs  Current medications and supplements including opioid prescriptions. Patient is not currently taking opioid prescriptions. Functional ability and status Nutritional status Physical activity Advanced directives List of other physicians Hospitalizations, surgeries, and  ER visits in previous 12 months Vitals Screenings to include cognitive, depression, and falls Referrals and appointments  In addition, I have reviewed and discussed with patient certain preventive protocols, quality metrics, and best practice recommendations. A written personalized care plan for preventive services as well as general preventive health recommendations were provided to patient.     Nicoletta Dress, [place]   07/17/2022   Nurse Notes: face to face  Mr. Levison , Thank you for taking time to come for your Medicare Wellness Visit. I appreciate your ongoing commitment to your health goals. Please review the following plan we discussed and let me know if I can assist you in the future.   These are the goals we discussed:  Goals      Exercise 3x per week (30 min per time)     Get out of the house and go walking     Quit smoking / using tobacco        This is a list of the screening recommended for you and due dates:  Health Maintenance  Topic Date Due   Zoster (Shingles) Vaccine (1 of 2) Never done   Stool Blood Test  06/19/2015   Colon Cancer Screening  10/10/2015   COVID-19 Vaccine (3 - Pfizer risk series) 09/21/2019   Flu Shot  12/06/2021   DTaP/Tdap/Td vaccine (2 - Td or Tdap) 06/18/2022   Medicare Annual Wellness Visit  07/17/2023   Pneumonia Vaccine  Completed   Hepatitis C Screening: USPSTF Recommendation to screen - Ages 18-79 yo.  Completed   HPV Vaccine  Aged Out

## 2022-07-17 NOTE — Assessment & Plan Note (Signed)
Patient is due for DTaP vaccine and expressed an interest in receiving it. Given his metastatic prostate cancer, colon cancer screening is unwarranted.  - Administer DTaP vaccine

## 2022-07-17 NOTE — Patient Instructions (Signed)
Health Maintenance, Male Adopting a healthy lifestyle and getting preventive care are important in promoting health and wellness. Ask your health care provider about: The right schedule for you to have regular tests and exams. Things you can do on your own to prevent diseases and keep yourself healthy. What should I know about diet, weight, and exercise? Eat a healthy diet  Eat a diet that includes plenty of vegetables, fruits, low-fat dairy products, and lean protein. Do not eat a lot of foods that are high in solid fats, added sugars, or sodium. Maintain a healthy weight Body mass index (BMI) is a measurement that can be used to identify possible weight problems. It estimates body fat based on height and weight. Your health care provider can help determine your BMI and help you achieve or maintain a healthy weight. Get regular exercise Get regular exercise. This is one of the most important things you can do for your health. Most adults should: Exercise for at least 150 minutes each week. The exercise should increase your heart rate and make you sweat (moderate-intensity exercise). Do strengthening exercises at least twice a week. This is in addition to the moderate-intensity exercise. Spend less time sitting. Even light physical activity can be beneficial. Watch cholesterol and blood lipids Have your blood tested for lipids and cholesterol at 69 years of age, then have this test every 5 years. You may need to have your cholesterol levels checked more often if: Your lipid or cholesterol levels are high. You are older than 69 years of age. You are at high risk for heart disease. What should I know about cancer screening? Many types of cancers can be detected early and may often be prevented. Depending on your health history and family history, you may need to have cancer screening at various ages. This may include screening for: Colorectal cancer. Prostate cancer. Skin cancer. Lung  cancer. What should I know about heart disease, diabetes, and high blood pressure? Blood pressure and heart disease High blood pressure causes heart disease and increases the risk of stroke. This is more likely to develop in people who have high blood pressure readings or are overweight. Talk with your health care provider about your target blood pressure readings. Have your blood pressure checked: Every 3-5 years if you are 18-39 years of age. Every year if you are 40 years old or older. If you are between the ages of 65 and 75 and are a current or former smoker, ask your health care provider if you should have a one-time screening for abdominal aortic aneurysm (AAA). Diabetes Have regular diabetes screenings. This checks your fasting blood sugar level. Have the screening done: Once every three years after age 45 if you are at a normal weight and have a low risk for diabetes. More often and at a younger age if you are overweight or have a high risk for diabetes. What should I know about preventing infection? Hepatitis B If you have a higher risk for hepatitis B, you should be screened for this virus. Talk with your health care provider to find out if you are at risk for hepatitis B infection. Hepatitis C Blood testing is recommended for: Everyone born from 1945 through 1965. Anyone with known risk factors for hepatitis C. Sexually transmitted infections (STIs) You should be screened each year for STIs, including gonorrhea and chlamydia, if: You are sexually active and are younger than 69 years of age. You are older than 69 years of age and your   health care provider tells you that you are at risk for this type of infection. Your sexual activity has changed since you were last screened, and you are at increased risk for chlamydia or gonorrhea. Ask your health care provider if you are at risk. Ask your health care provider about whether you are at high risk for HIV. Your health care provider  may recommend a prescription medicine to help prevent HIV infection. If you choose to take medicine to prevent HIV, you should first get tested for HIV. You should then be tested every 3 months for as long as you are taking the medicine. Follow these instructions at home: Alcohol use Do not drink alcohol if your health care provider tells you not to drink. If you drink alcohol: Limit how much you have to 0-2 drinks a day. Know how much alcohol is in your drink. In the [place] one drink equals one 12 oz bottle of beer (355 mL), one 5 oz glass of wine (148 mL), or one 1 oz glass of hard liquor (44 mL). Lifestyle Do not use any products that contain nicotine or tobacco. These products include cigarettes, chewing tobacco, and vaping devices, such as e-cigarettes. If you need help quitting, ask your health care provider. Do not use street drugs. Do not share needles. Ask your health care provider for help if you need support or information about quitting drugs. General instructions Schedule regular health, dental, and eye exams. Stay current with your vaccines. Tell your health care provider if: You often feel depressed. You have ever been abused or do not feel safe at home. Summary Adopting a healthy lifestyle and getting preventive care are important in promoting health and wellness. Follow your health care provider's instructions about healthy diet, exercising, and getting tested or screened for diseases. Follow your health care provider's instructions on monitoring your cholesterol and blood pressure. This information is not intended to replace advice given to you by your health care provider. Make sure you discuss any questions you have with your health care provider. Document Revised: 09/13/2020 Document Reviewed: 09/13/2020 Elsevier Patient Education  2023 Elsevier Inc.  

## 2022-07-20 NOTE — Progress Notes (Signed)
Internal Medicine Clinic Attending  Case discussed with Dr. Harper  At the time of the visit.  We reviewed the resident's history and exam and pertinent patient test results.  I agree with the assessment, diagnosis, and plan of care documented in the resident's note.  

## 2022-08-02 DIAGNOSIS — J449 Chronic obstructive pulmonary disease, unspecified: Secondary | ICD-10-CM | POA: Diagnosis not present

## 2022-08-04 ENCOUNTER — Inpatient Hospital Stay (HOSPITAL_COMMUNITY)
Admission: EM | Admit: 2022-08-04 | Discharge: 2022-08-11 | DRG: 189 | Disposition: A | Discharge status: Home Health | Payer: Medicare Other | Attending: Internal Medicine | Admitting: Internal Medicine

## 2022-08-04 ENCOUNTER — Other Ambulatory Visit: Payer: Self-pay

## 2022-08-04 ENCOUNTER — Encounter (HOSPITAL_COMMUNITY): Payer: Self-pay

## 2022-08-04 ENCOUNTER — Observation Stay (HOSPITAL_COMMUNITY): Payer: Medicare Other

## 2022-08-04 ENCOUNTER — Emergency Department (HOSPITAL_COMMUNITY): Payer: Medicare Other

## 2022-08-04 DIAGNOSIS — N320 Bladder-neck obstruction: Secondary | ICD-10-CM | POA: Diagnosis present

## 2022-08-04 DIAGNOSIS — R531 Weakness: Secondary | ICD-10-CM | POA: Diagnosis present

## 2022-08-04 DIAGNOSIS — J309 Allergic rhinitis, unspecified: Secondary | ICD-10-CM | POA: Diagnosis present

## 2022-08-04 DIAGNOSIS — R339 Retention of urine, unspecified: Principal | ICD-10-CM

## 2022-08-04 DIAGNOSIS — Z83719 Family history of colon polyps, unspecified: Secondary | ICD-10-CM

## 2022-08-04 DIAGNOSIS — Z8546 Personal history of malignant neoplasm of prostate: Secondary | ICD-10-CM

## 2022-08-04 DIAGNOSIS — D72829 Elevated white blood cell count, unspecified: Secondary | ICD-10-CM | POA: Diagnosis present

## 2022-08-04 DIAGNOSIS — C7951 Secondary malignant neoplasm of bone: Secondary | ICD-10-CM | POA: Diagnosis not present

## 2022-08-04 DIAGNOSIS — Z87891 Personal history of nicotine dependence: Secondary | ICD-10-CM

## 2022-08-04 DIAGNOSIS — Z7952 Long term (current) use of systemic steroids: Secondary | ICD-10-CM

## 2022-08-04 DIAGNOSIS — R000 Tachycardia, unspecified: Secondary | ICD-10-CM | POA: Diagnosis not present

## 2022-08-04 DIAGNOSIS — C61 Malignant neoplasm of prostate: Secondary | ICD-10-CM | POA: Diagnosis not present

## 2022-08-04 DIAGNOSIS — L89311 Pressure ulcer of right buttock, stage 1: Secondary | ICD-10-CM | POA: Diagnosis present

## 2022-08-04 DIAGNOSIS — Z9981 Dependence on supplemental oxygen: Secondary | ICD-10-CM | POA: Diagnosis not present

## 2022-08-04 DIAGNOSIS — J449 Chronic obstructive pulmonary disease, unspecified: Secondary | ICD-10-CM | POA: Diagnosis not present

## 2022-08-04 DIAGNOSIS — R5381 Other malaise: Secondary | ICD-10-CM | POA: Diagnosis present

## 2022-08-04 DIAGNOSIS — Z825 Family history of asthma and other chronic lower respiratory diseases: Secondary | ICD-10-CM

## 2022-08-04 DIAGNOSIS — M1990 Unspecified osteoarthritis, unspecified site: Secondary | ICD-10-CM | POA: Diagnosis present

## 2022-08-04 DIAGNOSIS — Z8249 Family history of ischemic heart disease and other diseases of the circulatory system: Secondary | ICD-10-CM | POA: Diagnosis not present

## 2022-08-04 DIAGNOSIS — Z1152 Encounter for screening for COVID-19: Secondary | ICD-10-CM | POA: Diagnosis not present

## 2022-08-04 DIAGNOSIS — J441 Chronic obstructive pulmonary disease with (acute) exacerbation: Secondary | ICD-10-CM

## 2022-08-04 DIAGNOSIS — J439 Emphysema, unspecified: Secondary | ICD-10-CM | POA: Diagnosis not present

## 2022-08-04 DIAGNOSIS — Z87442 Personal history of urinary calculi: Secondary | ICD-10-CM

## 2022-08-04 DIAGNOSIS — R0902 Hypoxemia: Secondary | ICD-10-CM | POA: Diagnosis not present

## 2022-08-04 DIAGNOSIS — N200 Calculus of kidney: Secondary | ICD-10-CM | POA: Diagnosis not present

## 2022-08-04 DIAGNOSIS — R338 Other retention of urine: Secondary | ICD-10-CM | POA: Diagnosis present

## 2022-08-04 DIAGNOSIS — N132 Hydronephrosis with renal and ureteral calculous obstruction: Secondary | ICD-10-CM | POA: Diagnosis present

## 2022-08-04 DIAGNOSIS — N179 Acute kidney failure, unspecified: Secondary | ICD-10-CM

## 2022-08-04 DIAGNOSIS — Z9842 Cataract extraction status, left eye: Secondary | ICD-10-CM

## 2022-08-04 DIAGNOSIS — N1330 Unspecified hydronephrosis: Secondary | ICD-10-CM | POA: Diagnosis not present

## 2022-08-04 DIAGNOSIS — N281 Cyst of kidney, acquired: Secondary | ICD-10-CM | POA: Diagnosis not present

## 2022-08-04 DIAGNOSIS — L89321 Pressure ulcer of left buttock, stage 1: Secondary | ICD-10-CM | POA: Diagnosis present

## 2022-08-04 DIAGNOSIS — N138 Other obstructive and reflux uropathy: Secondary | ICD-10-CM | POA: Diagnosis not present

## 2022-08-04 DIAGNOSIS — N401 Enlarged prostate with lower urinary tract symptoms: Secondary | ICD-10-CM | POA: Diagnosis present

## 2022-08-04 DIAGNOSIS — Z79899 Other long term (current) drug therapy: Secondary | ICD-10-CM

## 2022-08-04 DIAGNOSIS — Z85528 Personal history of other malignant neoplasm of kidney: Secondary | ICD-10-CM

## 2022-08-04 DIAGNOSIS — L8990 Pressure ulcer of unspecified site, unspecified stage: Secondary | ICD-10-CM | POA: Insufficient documentation

## 2022-08-04 DIAGNOSIS — R269 Unspecified abnormalities of gait and mobility: Secondary | ICD-10-CM | POA: Diagnosis present

## 2022-08-04 DIAGNOSIS — J9621 Acute and chronic respiratory failure with hypoxia: Principal | ICD-10-CM | POA: Diagnosis present

## 2022-08-04 DIAGNOSIS — R0602 Shortness of breath: Secondary | ICD-10-CM | POA: Diagnosis not present

## 2022-08-04 DIAGNOSIS — J9620 Acute and chronic respiratory failure, unspecified whether with hypoxia or hypercapnia: Secondary | ICD-10-CM | POA: Diagnosis present

## 2022-08-04 DIAGNOSIS — Z9841 Cataract extraction status, right eye: Secondary | ICD-10-CM

## 2022-08-04 DIAGNOSIS — Z7951 Long term (current) use of inhaled steroids: Secondary | ICD-10-CM

## 2022-08-04 DIAGNOSIS — K449 Diaphragmatic hernia without obstruction or gangrene: Secondary | ICD-10-CM | POA: Diagnosis not present

## 2022-08-04 LAB — CBC WITH DIFFERENTIAL/PLATELET
Abs Immature Granulocytes: 0.06 10*3/uL (ref 0.00–0.07)
Abs Immature Granulocytes: 0.05 10*3/uL (ref 0.00–0.07)
Basophils Absolute: 0 10*3/uL (ref 0.0–0.1)
Basophils Absolute: 0 10*3/uL (ref 0.0–0.1)
Basophils Relative: 0 %
Basophils Relative: 0 %
Eosinophils Absolute: 0 10*3/uL (ref 0.0–0.5)
Eosinophils Absolute: 0 10*3/uL (ref 0.0–0.5)
Eosinophils Relative: 0 %
Eosinophils Relative: 0 %
HCT: 42.3 % (ref 39.0–52.0)
HCT: 40.6 % (ref 39.0–52.0)
Hemoglobin: 13.5 g/dL (ref 13.0–17.0)
Hemoglobin: 12.9 g/dL — ABNORMAL LOW (ref 13.0–17.0)
Immature Granulocytes: 0 %
Immature Granulocytes: 0 %
Lymphocytes Relative: 4 %
Lymphocytes Relative: 1 %
Lymphs Abs: 0.7 10*3/uL (ref 0.7–4.0)
Lymphs Abs: 0.2 10*3/uL — ABNORMAL LOW (ref 0.7–4.0)
MCH: 28.5 pg (ref 26.0–34.0)
MCH: 29 pg (ref 26.0–34.0)
MCHC: 31.9 g/dL (ref 30.0–36.0)
MCHC: 31.8 g/dL (ref 30.0–36.0)
MCV: 89.2 fL (ref 80.0–100.0)
MCV: 91.2 fL (ref 80.0–100.0)
Monocytes Absolute: 1.7 10*3/uL — ABNORMAL HIGH (ref 0.1–1.0)
Monocytes Absolute: 0.2 10*3/uL (ref 0.1–1.0)
Monocytes Relative: 10 %
Monocytes Relative: 1 %
Neutro Abs: 14.6 10*3/uL — ABNORMAL HIGH (ref 1.7–7.7)
Neutro Abs: 13.1 10*3/uL — ABNORMAL HIGH (ref 1.7–7.7)
Neutrophils Relative %: 86 %
Neutrophils Relative %: 98 %
Platelets: 194 10*3/uL (ref 150–400)
Platelets: 187 10*3/uL (ref 150–400)
RBC: 4.74 MIL/uL (ref 4.22–5.81)
RBC: 4.45 MIL/uL (ref 4.22–5.81)
RDW: 12.4 % (ref 11.5–15.5)
RDW: 12.6 % (ref 11.5–15.5)
WBC: 17.1 10*3/uL — ABNORMAL HIGH (ref 4.0–10.5)
WBC: 13.5 10*3/uL — ABNORMAL HIGH (ref 4.0–10.5)
nRBC: 0 % (ref 0.0–0.2)
nRBC: 0 % (ref 0.0–0.2)

## 2022-08-04 LAB — COMPREHENSIVE METABOLIC PANEL
ALT: 15 U/L (ref 0–44)
AST: 27 U/L (ref 15–41)
Albumin: 3.5 g/dL (ref 3.5–5.0)
Alkaline Phosphatase: 61 U/L (ref 38–126)
Anion gap: 10 (ref 5–15)
BUN: 51 mg/dL — ABNORMAL HIGH (ref 8–23)
CO2: 31 mmol/L (ref 22–32)
Calcium: 8.9 mg/dL (ref 8.9–10.3)
Chloride: 96 mmol/L — ABNORMAL LOW (ref 98–111)
Creatinine, Ser: 2.54 mg/dL — ABNORMAL HIGH (ref 0.61–1.24)
GFR, Estimated: 27 mL/min — ABNORMAL LOW (ref >60)
Glucose, Bld: 121 mg/dL — ABNORMAL HIGH (ref 70–99)
Potassium: 4.3 mmol/L (ref 3.5–5.1)
Sodium: 137 mmol/L (ref 135–145)
Total Bilirubin: 0.7 mg/dL (ref 0.3–1.2)
Total Protein: 6.9 g/dL (ref 6.5–8.1)

## 2022-08-04 LAB — RESPIRATORY PANEL BY PCR

## 2022-08-04 LAB — BASIC METABOLIC PANEL
Anion gap: 14 (ref 5–15)
BUN: 61 mg/dL — ABNORMAL HIGH (ref 8–23)
CO2: 33 mmol/L — ABNORMAL HIGH (ref 22–32)
Calcium: 9.6 mg/dL (ref 8.9–10.3)
Chloride: 85 mmol/L — ABNORMAL LOW (ref 98–111)
Creatinine, Ser: 4.2 mg/dL — ABNORMAL HIGH (ref 0.61–1.24)
GFR, Estimated: 15 mL/min — ABNORMAL LOW (ref >60)
Glucose, Bld: 111 mg/dL — ABNORMAL HIGH (ref 70–99)
Potassium: 4.9 mmol/L (ref 3.5–5.1)
Sodium: 132 mmol/L — ABNORMAL LOW (ref 135–145)

## 2022-08-04 LAB — RESP PANEL BY RT-PCR (RSV, FLU A&B, COVID)  RVPGX2
Influenza A by PCR: NEGATIVE
Influenza B by PCR: NEGATIVE
Resp Syncytial Virus by PCR: NEGATIVE
SARS Coronavirus 2 by RT PCR: NEGATIVE

## 2022-08-04 LAB — BRAIN NATRIURETIC PEPTIDE: B Natriuretic Peptide: 49 pg/mL (ref 0.0–100.0)

## 2022-08-04 LAB — URINALYSIS, ROUTINE W REFLEX MICROSCOPIC
Bilirubin Urine: NEGATIVE
Glucose, UA: NEGATIVE mg/dL
Hgb urine dipstick: NEGATIVE
Ketones, ur: 5 mg/dL — AB
Leukocytes,Ua: NEGATIVE
Nitrite: NEGATIVE
Protein, ur: NEGATIVE mg/dL
Specific Gravity, Urine: 1.013 (ref 1.005–1.030)
pH: 7 (ref 5.0–8.0)

## 2022-08-04 LAB — MAGNESIUM: Magnesium: 2.9 mg/dL — ABNORMAL HIGH (ref 1.7–2.4)

## 2022-08-04 LAB — TROPONIN I (HIGH SENSITIVITY)
Troponin I (High Sensitivity): 11 ng/L (ref <18)
Troponin I (High Sensitivity): 11 ng/L (ref <18)

## 2022-08-04 LAB — PHOSPHORUS: Phosphorus: 5.2 mg/dL — ABNORMAL HIGH (ref 2.5–4.6)

## 2022-08-04 LAB — SODIUM, URINE, RANDOM: Sodium, Ur: 105 mmol/L

## 2022-08-04 LAB — HIV ANTIBODY (ROUTINE TESTING W REFLEX): HIV Screen 4th Generation wRfx: NONREACTIVE

## 2022-08-04 MED ORDER — SODIUM CHLORIDE 0.9 % IV BOLUS
1000.0000 mL | Freq: Once | INTRAVENOUS | Status: AC
  Administered 2022-08-04: 1000 mL via INTRAVENOUS

## 2022-08-04 MED ORDER — BUDESONIDE 0.25 MG/2ML IN SUSP
0.2500 mg | Freq: Two times a day (BID) | RESPIRATORY_TRACT | Status: DC
  Administered 2022-08-04 – 2022-08-11 (×14): 0.25 mg via RESPIRATORY_TRACT
  Filled 2022-08-04 (×14): qty 2

## 2022-08-04 MED ORDER — IPRATROPIUM-ALBUTEROL 0.5-2.5 (3) MG/3ML IN SOLN
3.0000 mL | RESPIRATORY_TRACT | Status: DC | PRN
  Filled 2022-08-04: qty 3

## 2022-08-04 MED ORDER — TAMSULOSIN HCL 0.4 MG PO CAPS
0.4000 mg | ORAL_CAPSULE | Freq: Every day | ORAL | Status: DC
  Administered 2022-08-05 – 2022-08-11 (×7): 0.4 mg via ORAL
  Filled 2022-08-04 (×7): qty 1

## 2022-08-04 MED ORDER — METHYLPREDNISOLONE SODIUM SUCC 125 MG IJ SOLR
125.0000 mg | Freq: Once | INTRAMUSCULAR | Status: AC
  Administered 2022-08-04: 125 mg via INTRAVENOUS
  Filled 2022-08-04: qty 2

## 2022-08-04 MED ORDER — IPRATROPIUM-ALBUTEROL 0.5-2.5 (3) MG/3ML IN SOLN
3.0000 mL | Freq: Once | RESPIRATORY_TRACT | Status: AC
  Administered 2022-08-04: 3 mL via RESPIRATORY_TRACT
  Filled 2022-08-04: qty 3

## 2022-08-04 MED ORDER — IPRATROPIUM-ALBUTEROL 0.5-2.5 (3) MG/3ML IN SOLN
3.0000 mL | Freq: Four times a day (QID) | RESPIRATORY_TRACT | Status: DC
  Administered 2022-08-04 – 2022-08-06 (×8): 3 mL via RESPIRATORY_TRACT
  Filled 2022-08-04 (×9): qty 3

## 2022-08-04 MED ORDER — HEPARIN SODIUM (PORCINE) 5000 UNIT/ML IJ SOLN
5000.0000 [IU] | Freq: Two times a day (BID) | INTRAMUSCULAR | Status: DC
  Administered 2022-08-04 – 2022-08-05 (×2): 5000 [IU] via SUBCUTANEOUS
  Filled 2022-08-04 (×2): qty 1

## 2022-08-04 MED ORDER — METHYLPREDNISOLONE SODIUM SUCC 40 MG IJ SOLR
40.0000 mg | Freq: Two times a day (BID) | INTRAMUSCULAR | Status: DC
  Administered 2022-08-05 – 2022-08-11 (×13): 40 mg via INTRAVENOUS
  Filled 2022-08-04 (×13): qty 1

## 2022-08-04 MED ORDER — LIDOCAINE HCL URETHRAL/MUCOSAL 2 % EX GEL
1.0000 | Freq: Once | CUTANEOUS | Status: AC
  Administered 2022-08-04: 1 via URETHRAL
  Filled 2022-08-04: qty 11

## 2022-08-04 MED ORDER — MAGNESIUM SULFATE 2 GM/50ML IV SOLN
2.0000 g | Freq: Once | INTRAVENOUS | Status: AC
  Administered 2022-08-04: 2 g via INTRAVENOUS
  Filled 2022-08-04: qty 50

## 2022-08-04 MED ORDER — SODIUM CHLORIDE 0.9 % IV SOLN: INTRAVENOUS | Status: DC

## 2022-08-04 MED ORDER — FLUTICASONE PROPIONATE 50 MCG/ACT NA SUSP
1.0000 | Freq: Every day | NASAL | Status: DC
  Administered 2022-08-05 – 2022-08-06 (×2): 1 via NASAL
  Filled 2022-08-04: qty 16

## 2022-08-04 MED ORDER — OXYCODONE-ACETAMINOPHEN 5-325 MG PO TABS
1.0000 | ORAL_TABLET | Freq: Four times a day (QID) | ORAL | Status: DC | PRN
  Filled 2022-08-04: qty 1

## 2022-08-04 NOTE — ED Provider Notes (Signed)
[place] EMERGENCY DEPARTMENT AT [facility] Provider Note   CSN: [id number] Arrival date & time: 08/04/22  1247     History  Chief Complaint  Patient presents with   Urinary Retention    Frank [name] is a 67 y.o. male.  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  States he has not been able to urinate for the past 3 days.  Does have a history of prostate cancer with mets to the bone.  Feels full in his bladder but denies pain.  Increase shortness of breath over the past several days despite using his home nebulizers.  Maintaining his oxygenation on 4 L nasal cannula.  Denies cough, chest pain, runny nose or sore throat.  No fever.  No significant leg swelling.  States compliance with his medications including his albuterol. He has required Foley catheter in the past as well as a kidney stones in the past requiring intervention proceed.  Denies any flank pain or abdominal pain.  The history is provided by the patient and the EMS personnel.       Home Medications Prior to Admission medications   Medication Sig Start Date End Date Taking? Authorizing Provider  albuterol (ACCUNEB) 0.63 MG/3ML nebulizer solution USE 1 VIAL IN NEBULIZER EVERY 6 HOURS 03/07/22   Collene Gobble, MD  albuterol (VENTOLIN HFA) 108 (90 Base) MCG/ACT inhaler INHALE 2 PUFFS BY MOUTH EVERY 4 HOURS AS NEEDED FOR WHEEZING OR SHORTNESS OF BREATH 07/03/22   Margaretha Seeds, MD  fluticasone [facility]) 50 MCG/ACT nasal spray Place 1 spray into both nostrils daily. 08/10/20   Collene Gobble, MD  Fluticasone-Umeclidin-Vilant (TRELEGY ELLIPTA) 100-62.5-25 MCG/ACT AEPB INHALE 1 PUFF INTO THE LUNGS DAILY 07/03/22   Margaretha Seeds, MD  guaiFENesin (MUCINEX) 600 MG 12 hr tablet Take 600 mg by mouth 2 (two) times daily as needed for to loosen phlegm or cough.    [provider]   naproxen sodium (ALEVE) 220 MG tablet Take 220 mg by mouth daily as needed (pain).    [provider]  oxyCODONE-acetaminophen (PERCOCET) 5-325 MG tablet Take 1 tablet by mouth every 6 (six) hours as needed for severe pain or moderate pain (post-operatively). 01/13/22 01/13/23  Alexis Frock, MD  OXYGEN Inhale 4 L/min into the lungs continuous.     [provider]  predniSONE (DELTASONE) 10 MG tablet TAKE 1 TABLET(10 MG) BY MOUTH DAILY WITH BREAKFAST 07/03/22   Margaretha Seeds, MD  predniSONE (DELTASONE) 5 MG tablet Take 5 mg by mouth daily with breakfast.    [provider]  tamsulosin (FLOMAX) 0.4 MG CAPS capsule TAKE 1 CAPSULE(0.4 MG) BY MOUTH DAILY 02/17/22   Delene Ruffini, MD      Allergies    Patient has no known allergies.    Review of Systems   Review of Systems  Constitutional:  Negative for activity change, appetite change and fever.  HENT:  Negative for congestion and rhinorrhea.   Eyes:  Negative for visual disturbance.  Respiratory:  Positive for cough, chest tightness and shortness of breath.   Cardiovascular:  Negative for chest pain.  Gastrointestinal:  Positive for abdominal pain. Negative for nausea and vomiting.  Genitourinary:  Positive for decreased urine volume and difficulty urinating.  Musculoskeletal:  Negative for arthralgias and myalgias.  Skin:  Negative for rash.  Neurological:  Positive for weakness. Negative  for dizziness and headaches.   all other systems are negative except as noted in the HPI and PMH.    Physical Exam Updated Vital Signs BP 120/87 (BP Location: Right Arm)   Pulse (!) 116   Temp 97.7 F (36.5 C) (Oral)   Resp 20   SpO2 93%  Physical Exam Vitals and nursing note reviewed.  Constitutional:      General: He is in acute distress.     Appearance: He is well-developed.     Comments: Labored breathing at rest, speaking in short phrases  HENT:     Head: Normocephalic and atraumatic.     Mouth/Throat:      Pharynx: No oropharyngeal exudate.  Eyes:     Conjunctiva/sclera: Conjunctivae normal.     Pupils: Pupils are equal, round, and reactive to light.  Neck:     Comments: No meningismus. Cardiovascular:     Rate and Rhythm: Regular rhythm. Tachycardia present.     Heart sounds: Normal heart sounds. No murmur heard. Pulmonary:     Effort: Pulmonary effort is normal. No respiratory distress.     Breath sounds: Wheezing present.     Comments: Diminished air exchange with expiratory wheeze Abdominal:     Palpations: Abdomen is soft.     Tenderness: There is abdominal tenderness. There is no guarding or rebound.     Comments: Full bladder  Musculoskeletal:        General: No tenderness. Normal range of motion.     Cervical back: Normal range of motion and neck supple.  Skin:    General: Skin is warm.  Neurological:     Mental Status: He is alert and oriented to person, place, and time.     Cranial Nerves: No cranial nerve deficit.     Motor: No abnormal muscle tone.     Coordination: Coordination normal.     Comments:  5/5 strength throughout. CN 2-12 intact.Equal grip strength.   Psychiatric:        Behavior: Behavior normal.     ED Results / Procedures / Treatments   Labs (all labs ordered are listed, but only abnormal results are displayed) Labs Reviewed  CBC WITH DIFFERENTIAL/PLATELET - Abnormal; Notable for the following components:      Result Value   WBC 17.1 (*)    Neutro Abs 14.6 (*)    Monocytes Absolute 1.7 (*)    All other components within normal limits  BASIC METABOLIC PANEL - Abnormal; Notable for the following components:   Sodium 132 (*)    Chloride 85 (*)    CO2 33 (*)    Glucose, Bld 111 (*)    BUN 61 (*)    Creatinine, Ser 4.20 (*)    GFR, Estimated 15 (*)    All other components within normal limits  URINALYSIS, ROUTINE W REFLEX MICROSCOPIC - Abnormal; Notable for the following components:   Ketones, ur 5 (*)    All other components within  normal limits  BRAIN NATRIURETIC PEPTIDE  TROPONIN I (HIGH SENSITIVITY)  TROPONIN I (HIGH SENSITIVITY)    EKG EKG Interpretation  Date/Time:  Friday August 04 2022 12:59:45 EDT Ventricular Rate:  116 PR Interval:  145 QRS Duration: 119 QT Interval:  327 QTC Calculation: 455 R Axis:   230 Text Interpretation: Sinus tachycardia Right atrial enlargement Right bundle branch block Probable inferior infarct, old Lateral leads are also involved Rate faster Confirmed by Ezequiel Essex [phone]) on 08/04/2022 1:56:23 PM  Radiology DG Chest Portable  1 View  Result Date: 08/04/2022 CLINICAL DATA:  Shortness of breath. Inability to urinate for 3 days. History of metastatic prostate cancer. EXAM: PORTABLE CHEST 1 VIEW COMPARISON:  Radiographs 03/31/2020 and 02/28/2020. FINDINGS: 1249 hours. The heart size and mediastinal contours are stable. There is aortic atherosclerosis. The lungs are hyperinflated but clear. No pleural effusion or pneumothorax. No acute osseous findings are evident. Telemetry leads overlie the chest. IMPRESSION: Chronic obstructive pulmonary disease. No evidence of acute cardiopulmonary process. Electronically Signed   By: Richardean Sale M.D.   On: 08/04/2022 13:33    Procedures .Critical Care  Performed by: Ezequiel Essex, MD Authorized by: Ezequiel Essex, MD   Critical care provider statement:    Critical care time (minutes):  35   Critical care time was exclusive of:  Separately billable procedures and treating other patients   Critical care was necessary to treat or prevent imminent or life-threatening deterioration of the following conditions:  Respiratory failure and renal failure   Critical care was time spent personally by me on the following activities:  Development of treatment plan with patient or surrogate, discussions with consultants, evaluation of patient's response to treatment, examination of patient, ordering and review of laboratory studies, ordering and  review of radiographic studies, ordering and performing treatments and interventions, pulse oximetry, re-evaluation of patient's condition, review of old charts, blood draw for specimens and obtaining history from patient or surrogate   I assumed direction of critical care for this patient from another provider in my specialty: no     Care discussed with: admitting provider       Medications Ordered in ED Medications  ipratropium-albuterol (DUONEB) 0.5-2.5 (3) MG/3ML nebulizer solution 3 mL (has no administration in time range)  methylPREDNISolone sodium succinate (SOLU-MEDROL) 125 mg/2 mL injection 125 mg (has no administration in time range)  sodium chloride 0.9 % bolus 1,000 mL (has no administration in time range)  lidocaine (XYLOCAINE) 2 % jelly 1 Application (has no administration in time range)    ED Course/ Medical Decision Making/ A&P                             Medical Decision Making Amount and/or Complexity of Data Reviewed Independent Historian: EMS Labs: ordered. Decision-making details documented in ED Course. Radiology: ordered and independent interpretation performed. Decision-making details documented in ED Course. ECG/medicine tests: ordered and independent interpretation performed. Decision-making details documented in ED Course.  Risk Prescription drug management. Decision regarding hospitalization.   Metastatic prostate cancer here with urinary retention for the past 3 days.  Also significantly short of breath secondary to COPD exacerbation.  Will give bronchodilators and steroids.  Place Foley catheter, check labs and x-ray.  Patient feels much improved with catheter placement.  Draining appropriately.  He is given bronchodilators and steroids for his breathing.  Chest x-ray is negative for infiltrate.  Results reviewed interpreted by me.  Sinus tachycardia on EKG.  Creatinine elevated to 4.2.  This likely secondary to post renal obstruction.  Will  hydrate.  Tachycardia likely secondary to albuterol use as well as pain.  Lower suspicion for PE given his wheezing on exam and lack of chest pain.  Will plan admission for IV hydration in the setting of acute renal failure and COPD exacerbation.  Continue bronchodilators and steroids.  Magnesium ordered.  CT renal study pending to evaluate for kidney stones.  Urinalysis pending.  Admission discussed with Dr. Marthenia Rolling.  Final Clinical Impression(s) / ED Diagnoses Final diagnoses:  Urinary retention  AKI (acute kidney injury) ([place])  COPD exacerbation ([place])    Rx / DC Orders ED Discharge Orders     None         Reya Aurich, Annie Main, MD 08/04/22 1551

## 2022-08-04 NOTE — ED Triage Notes (Addendum)
Pt coming form home with c/o of being unable to urinate x3 days. Hx prostate cancer with mets to bones. Pt also c/o generalized weakness with exertion. Pt states he started taking vitamin D 3 days ago and that is the only thing new. Pt denies any pain at this time. Hx COPD, 4L O2 [place] baseline.

## 2022-08-04 NOTE — H&P (Signed)
History and Physical  Frank [name] [id number] DOB: [birth year] DOA: 08/04/2022  Referring physician: Ezequiel Essex, MD PCP: Serita Butcher, MD  Outpatient Specialists: Dr. Tresa Moore, urologist. Patient coming from: Home.  Chief Complaint: Worsening shortness of breath.  HPI:  Patient is a 67 year old male with past medical history significant for COPD, chronic respiratory failure on 4 L of supplemental oxygen at home, prostate cancer, nephrolithiasis, pneumonia and arthritis.  Patient presents with worsening shortness of breath and wheezing.  Patient is also said not to have passed urine in the last 3 days.  Patient was found to be in acute kidney injury likely secondary to post renal obstruction (bladder outlet obstruction, likely from the prostate cancer).  Patient's serum creatinine was found to be 4.2 on presentation, with baseline serum creatinine of 0.81.  Foley catheter was inserted at the emergency room with significant urine output.  Patient is a bit sleepy, therefore, could not give much history.  ED Course: Foley catheter insertion.  Nebs DuoNeb.  Patient continues to wheeze.  Pertinent labs: CBC reveals WBC of 17.1, hemoglobin of 13.5, hematocrit of 42.3, MCV of 89.2 with platelet count of 194.  Renal panel revealed sodium of 132, potassium of 4.9, chloride of 85, CO2 of 33, BUN of 61 with serum creatinine of 4.20.  Chest x-ray reveals changes suggestive of COPD, without any acute findings.  EKG: Independently reviewed.   Imaging: independently reviewed.   Review of Systems:  -Unobtainable.  Patient is sleepy versus lethargic   Past Medical History:  Diagnosis Date   Arthritis    Cancer ([place])    COPD (chronic obstructive pulmonary disease) ([place]) 06/05/2012   Dyspnea    Dysrhythmia    Emphysema 06/07/2012   Heart murmur    hx of as baby   History of kidney stones    Hypoxemia 06/07/2012   Obstructive chronic bronchitis with exacerbation ([place]) 06/07/2012    Pneumonia    Tobacco abuse 06/05/2012    Past Surgical History:  Procedure Laterality Date   CATARACT EXTRACTION, BILATERAL     CYSTOSCOPY WITH LITHOLAPAXY N/A 01/13/2022   Procedure: CYSTOSCOPY WITH LITHOLAPAXY;  Surgeon: Alexis Frock, MD;  Location: [facility];  Service: Urology;  Laterality: N/A;   CYSTOSCOPY/RETROGRADE/URETEROSCOPY/STONE EXTRACTION WITH BASKET Right 10/04/2016   Procedure: CYSTOSCOPY/RETROGRADE/URETEROSCOPY/STONE EXTRACTION WITH BASKET/ STENT;  Surgeon: Alexis Frock, MD;  Location: [facility];  Service: Urology;  Laterality: Right;  With LASER   CYSTOSCOPY/URETEROSCOPY/HOLMIUM LASER/STENT PLACEMENT Right 01/13/2022   Procedure: CYSTOSCOPY/URETEROSCOPY/HOLMIUM LASER/STENT PLACEMENT;  Surgeon: Alexis Frock, MD;  Location: [facility];  Service: Urology;  Laterality: Right;   EXTRACORPOREAL SHOCK WAVE LITHOTRIPSY Right 06/29/2016   Procedure: RIGHT EXTRACORPOREAL SHOCK WAVE LITHOTRIPSY (ESWL);  Surgeon: Ardis Hughs, MD;  Location: [facility];  Service: Urology;  Laterality: Right;     reports that he quit smoking about 7 years ago. His smoking use included cigarettes. He has a 17.50 pack-year smoking history. He quit smokeless tobacco use about 54 years ago.  His smokeless tobacco use included chew. He reports that he does not drink alcohol and does not use drugs.  No Known Allergies  Family History  Problem Relation Age of Onset   COPD Mother    Colon polyps Father    Heart attack Father 35   Colon polyps Sister      Prior to Admission medications   Medication Sig Start Date End Date Taking? Authorizing Provider  albuterol (ACCUNEB) 0.63 MG/3ML nebulizer solution USE 1 VIAL IN NEBULIZER EVERY 6 HOURS 03/07/22  Collene Gobble, MD  albuterol (VENTOLIN HFA) 108 (90 Base) MCG/ACT inhaler INHALE 2 PUFFS BY MOUTH EVERY 4 HOURS AS NEEDED FOR WHEEZING OR SHORTNESS OF BREATH 07/03/22   Margaretha Seeds, MD  fluticasone [facility]) 50 MCG/ACT nasal spray Place 1 spray into both  nostrils daily. 08/10/20   Collene Gobble, MD  Fluticasone-Umeclidin-Vilant (TRELEGY ELLIPTA) 100-62.5-25 MCG/ACT AEPB INHALE 1 PUFF INTO THE LUNGS DAILY 07/03/22   Margaretha Seeds, MD  guaiFENesin (MUCINEX) 600 MG 12 hr tablet Take 600 mg by mouth 2 (two) times daily as needed for to loosen phlegm or cough.    [provider]  naproxen sodium (ALEVE) 220 MG tablet Take 220 mg by mouth daily as needed (pain).    [provider]  oxyCODONE-acetaminophen (PERCOCET) 5-325 MG tablet Take 1 tablet by mouth every 6 (six) hours as needed for severe pain or moderate pain (post-operatively). 01/13/22 01/13/23  Alexis Frock, MD  OXYGEN Inhale 4 L/min into the lungs continuous.     [provider]  predniSONE (DELTASONE) 10 MG tablet TAKE 1 TABLET(10 MG) BY MOUTH DAILY WITH BREAKFAST 07/03/22   Margaretha Seeds, MD  predniSONE (DELTASONE) 5 MG tablet Take 5 mg by mouth daily with breakfast.    [provider]  tamsulosin (FLOMAX) 0.4 MG CAPS capsule TAKE 1 CAPSULE(0.4 MG) BY MOUTH DAILY 02/17/22   Delene Ruffini, MD    Physical Exam: Vitals:   08/04/22 1255 08/04/22 1300 08/04/22 1303 08/04/22 1530  BP:  120/87  116/62  Pulse:  (!) 116  (!) 102  Resp:  20  (!) 30  Temp:   97.7 F (36.5 C)   TempSrc:   Oral   SpO2: 92% 93%  98%     Constitutional:  Appears sleepy versus lethargic.   Eyes:  No pallor. No jaundice.  ENMT:  external ears, nose appear normal Neck:  Neck is supple. No JVD Respiratory:  Decreased air entry.  Patient is wheezing.    Cardiovascular:  S1S2 Mild lower leg edema.   Abdomen:  Abdomen is obese versus gaseously distended, soft and nontender.   Neurologic:  Sleepy versus lethargic.. Moves all limbs.  Wt Readings from Last 3 Encounters:  07/17/22 63.6 kg  07/17/22 63.6 kg  01/11/22 69.4 kg    I have personally reviewed following labs and imaging studies  Labs on Admission:  CBC: Recent Labs  Lab 08/04/22 1353  WBC  17.1*  NEUTROABS 14.6*  HGB 13.5  HCT 42.3  MCV 89.2  PLT [id number]   Basic Metabolic Panel: Recent Labs  Lab 08/04/22 1353  NA 132*  K 4.9  CL 85*  CO2 33*  GLUCOSE 111*  BUN 61*  CREATININE 4.20*  CALCIUM 9.6   Liver Function Tests: No results for input(s): "AST", "ALT", "ALKPHOS", "BILITOT", "PROT", "ALBUMIN" in the last 168 hours. No results for input(s): "LIPASE", "AMYLASE" in the last 168 hours. No results for input(s): "AMMONIA" in the last 168 hours. Coagulation Profile: No results for input(s): "INR", "PROTIME" in the last 168 hours. Cardiac Enzymes: No results for input(s): "CKTOTAL", "CKMB", "CKMBINDEX", "TROPONINI" in the last 168 hours. BNP (last 3 results) No results for input(s): "PROBNP" in the last 8760 hours. HbA1C: No results for input(s): "HGBA1C" in the last 72 hours. CBG: No results for input(s): "GLUCAP" in the last 168 hours. Lipid Profile: No results for input(s): "CHOL", "HDL", "LDLCALC", "TRIG", "CHOLHDL", "LDLDIRECT" in the last 72 hours. Thyroid Function Tests: No results for input(s): "TSH", "  T4TOTAL", "FREET4", "T3FREE", "THYROIDAB" in the last 72 hours. Anemia Panel: No results for input(s): "VITAMINB12", "FOLATE", "FERRITIN", "TIBC", "IRON", "RETICCTPCT" in the last 72 hours. Urine analysis:    Component Value Date/Time   COLORURINE YELLOW 08/04/2022 1353   APPEARANCEUR CLEAR 08/04/2022 1353   LABSPEC 1.013 08/04/2022 1353   PHURINE 7.0 08/04/2022 1353   GLUCOSEU NEGATIVE 08/04/2022 1353   HGBUR NEGATIVE 08/04/2022 1353   BILIRUBINUR NEGATIVE 08/04/2022 1353   KETONESUR 5 (A) 08/04/2022 1353   PROTEINUR NEGATIVE 08/04/2022 1353   UROBILINOGEN 0.2 06/12/2014 1400   NITRITE NEGATIVE 08/04/2022 1353   LEUKOCYTESUR NEGATIVE 08/04/2022 1353   Sepsis Labs: @LABRCNTIP (procalcitonin:4,lacticidven:4) )No results found for this or any previous visit (from the past 240 hour(s)).    Radiological Exams on Admission: DG Chest Portable 1  View  Result Date: 08/04/2022 CLINICAL DATA:  Shortness of breath. Inability to urinate for 3 days. History of metastatic prostate cancer. EXAM: PORTABLE CHEST 1 VIEW COMPARISON:  Radiographs 03/31/2020 and 02/28/2020. FINDINGS: 1249 hours. The heart size and mediastinal contours are stable. There is aortic atherosclerosis. The lungs are hyperinflated but clear. No pleural effusion or pneumothorax. No acute osseous findings are evident. Telemetry leads overlie the chest. IMPRESSION: Chronic obstructive pulmonary disease. No evidence of acute cardiopulmonary process. Electronically Signed   By: Richardean Sale M.D.   On: 08/04/2022 13:33    EKG: Independently reviewed.   Principal Problem:   COPD exacerbation ([place])   Assessment/Plan Acute on chronic respiratory failure/COPD with exacerbation: -Admit patient for further assessment and management. -Continue supplemental oxygen. -Start patient on IV Solu-Medrol 40 Mg every 12, nebs DuoNeb every 6 hours and as needed albuterol nebulizer every 2 as needed. -ABG. -Manage patient expectantly.  Acute kidney injury: -Likely secondary to bladder outlet obstruction. -Patient has history of renal cancer. -Patient is known to the local urologist, Dr. Tammi Klippel. -12 consulted to monitor. -Foley tube insertion. -Adequate hydration. -Renal ultrasound. -Continue to monitor renal function and electrolytes. -Repeat renal panel. -Avoid nephrotoxins. -Keep MAP greater than 75%. -Further management depend on hospital course.  Prostate cancer: -Will defer to the urologist and oncology team.  History of nephrolithiasis: -Adequate hydration.  DVT prophylaxis: Subcutaneous heparin. Code Status: Full code. Family Communication:  Disposition Plan: Home eventually. Consults called: Will consult urology team. Admission status: Inpatient.  Time spent: 67 Minutes  Dana Allan, MD  [facility] Pager #: [phone] 7PM-7AM contact night  coverage as above  08/04/2022, 4:29 PM

## 2022-08-05 ENCOUNTER — Observation Stay (HOSPITAL_COMMUNITY): Payer: Medicare Other

## 2022-08-05 DIAGNOSIS — J9621 Acute and chronic respiratory failure with hypoxia: Secondary | ICD-10-CM | POA: Diagnosis present

## 2022-08-05 DIAGNOSIS — J439 Emphysema, unspecified: Secondary | ICD-10-CM | POA: Diagnosis not present

## 2022-08-05 DIAGNOSIS — L89311 Pressure ulcer of right buttock, stage 1: Secondary | ICD-10-CM | POA: Diagnosis present

## 2022-08-05 DIAGNOSIS — N138 Other obstructive and reflux uropathy: Secondary | ICD-10-CM | POA: Diagnosis present

## 2022-08-05 DIAGNOSIS — R5381 Other malaise: Secondary | ICD-10-CM | POA: Diagnosis present

## 2022-08-05 DIAGNOSIS — N132 Hydronephrosis with renal and ureteral calculous obstruction: Secondary | ICD-10-CM | POA: Diagnosis present

## 2022-08-05 DIAGNOSIS — N281 Cyst of kidney, acquired: Secondary | ICD-10-CM | POA: Diagnosis present

## 2022-08-05 DIAGNOSIS — N179 Acute kidney failure, unspecified: Secondary | ICD-10-CM | POA: Diagnosis present

## 2022-08-05 DIAGNOSIS — Z9981 Dependence on supplemental oxygen: Secondary | ICD-10-CM | POA: Diagnosis not present

## 2022-08-05 DIAGNOSIS — Z87891 Personal history of nicotine dependence: Secondary | ICD-10-CM | POA: Diagnosis not present

## 2022-08-05 DIAGNOSIS — R0602 Shortness of breath: Secondary | ICD-10-CM | POA: Diagnosis not present

## 2022-08-05 DIAGNOSIS — J309 Allergic rhinitis, unspecified: Secondary | ICD-10-CM | POA: Diagnosis present

## 2022-08-05 DIAGNOSIS — N320 Bladder-neck obstruction: Secondary | ICD-10-CM | POA: Diagnosis present

## 2022-08-05 DIAGNOSIS — C7951 Secondary malignant neoplasm of bone: Secondary | ICD-10-CM | POA: Diagnosis present

## 2022-08-05 DIAGNOSIS — N401 Enlarged prostate with lower urinary tract symptoms: Secondary | ICD-10-CM | POA: Diagnosis present

## 2022-08-05 DIAGNOSIS — R531 Weakness: Secondary | ICD-10-CM | POA: Diagnosis present

## 2022-08-05 DIAGNOSIS — D72829 Elevated white blood cell count, unspecified: Secondary | ICD-10-CM | POA: Diagnosis present

## 2022-08-05 DIAGNOSIS — R338 Other retention of urine: Secondary | ICD-10-CM | POA: Diagnosis present

## 2022-08-05 DIAGNOSIS — R269 Unspecified abnormalities of gait and mobility: Secondary | ICD-10-CM | POA: Diagnosis present

## 2022-08-05 DIAGNOSIS — Z8249 Family history of ischemic heart disease and other diseases of the circulatory system: Secondary | ICD-10-CM | POA: Diagnosis not present

## 2022-08-05 DIAGNOSIS — Z8546 Personal history of malignant neoplasm of prostate: Secondary | ICD-10-CM | POA: Diagnosis not present

## 2022-08-05 DIAGNOSIS — C61 Malignant neoplasm of prostate: Secondary | ICD-10-CM | POA: Diagnosis present

## 2022-08-05 DIAGNOSIS — J9620 Acute and chronic respiratory failure, unspecified whether with hypoxia or hypercapnia: Secondary | ICD-10-CM | POA: Diagnosis present

## 2022-08-05 DIAGNOSIS — L8990 Pressure ulcer of unspecified site, unspecified stage: Secondary | ICD-10-CM | POA: Insufficient documentation

## 2022-08-05 DIAGNOSIS — L89321 Pressure ulcer of left buttock, stage 1: Secondary | ICD-10-CM | POA: Diagnosis present

## 2022-08-05 DIAGNOSIS — R339 Retention of urine, unspecified: Secondary | ICD-10-CM | POA: Diagnosis present

## 2022-08-05 DIAGNOSIS — J441 Chronic obstructive pulmonary disease with (acute) exacerbation: Secondary | ICD-10-CM | POA: Diagnosis present

## 2022-08-05 DIAGNOSIS — Z1152 Encounter for screening for COVID-19: Secondary | ICD-10-CM | POA: Diagnosis not present

## 2022-08-05 LAB — BASIC METABOLIC PANEL
Anion gap: 10 (ref 5–15)
BUN: 40 mg/dL — ABNORMAL HIGH (ref 8–23)
CO2: 30 mmol/L (ref 22–32)
Calcium: 8.4 mg/dL — ABNORMAL LOW (ref 8.9–10.3)
Chloride: 100 mmol/L (ref 98–111)
Creatinine, Ser: 1.23 mg/dL (ref 0.61–1.24)
GFR, Estimated: >60 mL/min (ref >60)
Glucose, Bld: 92 mg/dL (ref 70–99)
Potassium: 4.7 mmol/L (ref 3.5–5.1)
Sodium: 140 mmol/L (ref 135–145)

## 2022-08-05 LAB — BLOOD GAS, ARTERIAL
Acid-Base Excess: 9.7 mmol/L — ABNORMAL HIGH (ref 0.0–2.0)
Acid-Base Excess: 11.6 mmol/L — ABNORMAL HIGH (ref 0.0–2.0)
Bicarbonate: 37.5 mmol/L — ABNORMAL HIGH (ref 20.0–28.0)
Bicarbonate: 39.5 mmol/L — ABNORMAL HIGH (ref 20.0–28.0)
O2 Saturation: 98.4 %
O2 Saturation: 97 %
Patient temperature: 36.5
Patient temperature: 37
pCO2 arterial: 67 mmHg (ref 32–48)
pCO2 arterial: 70 mmHg (ref 32–48)
pH, Arterial: 7.36 (ref 7.35–7.45)
pH, Arterial: 7.36 (ref 7.35–7.45)
pO2, Arterial: 72 mmHg — ABNORMAL LOW (ref 83–108)
pO2, Arterial: 76 mmHg — ABNORMAL LOW (ref 83–108)

## 2022-08-05 LAB — CBC
HCT: 35.3 % — ABNORMAL LOW (ref 39.0–52.0)
Hemoglobin: 10.8 g/dL — ABNORMAL LOW (ref 13.0–17.0)
MCH: 28.8 pg (ref 26.0–34.0)
MCHC: 30.6 g/dL (ref 30.0–36.0)
MCV: 94.1 fL (ref 80.0–100.0)
Platelets: 162 10*3/uL (ref 150–400)
RBC: 3.75 MIL/uL — ABNORMAL LOW (ref 4.22–5.81)
RDW: 12.7 % (ref 11.5–15.5)
WBC: 10.7 10*3/uL — ABNORMAL HIGH (ref 4.0–10.5)
nRBC: 0 % (ref 0.0–0.2)

## 2022-08-05 LAB — BRAIN NATRIURETIC PEPTIDE: B Natriuretic Peptide: 153 pg/mL — ABNORMAL HIGH (ref 0.0–100.0)

## 2022-08-05 MED ORDER — HEPARIN SODIUM (PORCINE) 5000 UNIT/ML IJ SOLN
5000.0000 [IU] | Freq: Three times a day (TID) | INTRAMUSCULAR | Status: DC
  Administered 2022-08-05 – 2022-08-11 (×17): 5000 [IU] via SUBCUTANEOUS
  Filled 2022-08-05 (×17): qty 1

## 2022-08-05 MED ORDER — CHLORHEXIDINE GLUCONATE CLOTH 2 % EX PADS
6.0000 | MEDICATED_PAD | Freq: Every day | CUTANEOUS | Status: DC
  Administered 2022-08-05 – 2022-08-11 (×7): 6 via TOPICAL

## 2022-08-05 MED ORDER — FUROSEMIDE 10 MG/ML IJ SOLN
20.0000 mg | Freq: Once | INTRAMUSCULAR | Status: AC
  Administered 2022-08-05: 20 mg via INTRAVENOUS
  Filled 2022-08-05: qty 2

## 2022-08-05 MED ORDER — GUAIFENESIN ER 600 MG PO TB12
600.0000 mg | ORAL_TABLET | Freq: Two times a day (BID) | ORAL | Status: DC | PRN
  Administered 2022-08-05 – 2022-08-09 (×4): 600 mg via ORAL
  Filled 2022-08-05 (×3): qty 1

## 2022-08-05 NOTE — Progress Notes (Signed)
PROGRESS NOTE    ZAMAN RINGOLD  [id number] DOB: 1956-02-28 DOA: 08/04/2022 PCP: Serita Butcher, MD  Outpatient Specialists:     Brief Narrative:  Patient is a 67 year old male with past medical history significant for chronic respiratory failure on 4 L of supplemental oxygen at home, COPD and prostate cancer.  Patient was admitted with COPD exacerbation, urinary retention and acute kidney injury.  Apparently, patient was unable to pass urine for 3 days.  Worsening shortness of breath and wheezing were also reported.  Foley catheter was placed in the ER.  Serum creatinine has returned to normal range.  Respiratory symptoms are slowly improving, however, patient continues to report shortness of breath with significant supplemental oxygen (6 L/min).  IV fluids have been discontinued.  For gentle diuresis.  Chest x-ray is noted.  08/05/2022: Patient seen alongside patient's sister.  Above documentation is noted.  Patient is slowly improving.  Patient actually looks much better today.   Assessment & Plan:   Principal Problem:   COPD exacerbation ([place]) Active Problems:   Acute on chronic respiratory failure (HCC)   Pressure injury of skin  Acute on chronic respiratory failure/COPD with exacerbation: -Admit patient for further assessment and management. -Continue supplemental oxygen. -Start patient on IV Solu-Medrol 40 Mg every 12, nebs DuoNeb every 6 hours and as needed albuterol nebulizer every 2 as needed. -ABG. -Manage patient expectantly. 08/05/2022: Patient lives better today.  Patient is on 6 L of supplemental oxygen.  Repeat chest x-ray.  Check cardiac BNP.  Gentle diuresis.  Continue treatment for COPD exacerbation.   Acute kidney injury: -Likely secondary to bladder outlet obstruction. -Patient has history of renal cancer. -Patient is known to the local urologist, Dr. Tammi Klippel. -12 consulted to monitor. -Foley tube insertion. -Adequate hydration. -Renal  ultrasound. -Continue to monitor renal function and electrolytes. -Repeat renal panel. -Avoid nephrotoxins. -Keep MAP greater than 75%. -Further management depend on hospital course. 08/05/2022: Postrenal AKI.  Resolved significantly.  Urology has been consulted.  Leave Foley in place for the next 5 to 10 days as per urology team.  Trial of voiding will be done at the urology office.   Prostate cancer: -Will defer to the urologist and oncology team.   History of nephrolithiasis: -Adequate hydration.   DVT prophylaxis: Subcutaneous heparin. Code Status: Full code. Family Communication: Sister Disposition Plan: Home eventually   Consultants:  Urology.  Procedures:  Indwelling Foley catheterization  Antimicrobials:  None   Subjective: Shortness of breath is improving.  Objective: Vitals:   08/05/22 0410 08/05/22 0848 08/05/22 1310 08/05/22 1404  BP: (!) 113/56  (!) 115/55   Pulse: 94  (!) 102   Resp: (!) 21  16   Temp: 97.9 F (36.6 C)  98.2 F (36.8 C)   TempSrc:      SpO2: 96% 91% (!) 86% 90%    Intake/Output Summary (Last 24 hours) at 08/05/2022 1644 Last data filed at 08/05/2022 1236 Gross per 24 hour  Intake 1265 ml  Output 1925 ml  Net -660 ml   There were no vitals filed for this visit.  Examination:  General exam: Appears calm and comfortable  Respiratory system: Decreased air entry with expiratory wheeze. Cardiovascular system: S1 & S2 heard Gastrointestinal system: Abdomen is soft and nontender.   Central nervous system: Awake and alert.  More communicative today.  Patient moves all extremities.   Extremities: No leg edema.  Data Reviewed: I have personally reviewed following labs and imaging studies  CBC: Recent Labs  Lab 08/04/22 1353 08/04/22 1911 08/05/22 0539  WBC 17.1* 13.5* 10.7*  NEUTROABS 14.6* 13.1*  --   HGB 13.5 12.9* 10.8*  HCT 42.3 40.6 35.3*  MCV 89.2 91.2 94.1  PLT 194 187 0000000   Basic Metabolic Panel: Recent Labs   Lab 08/04/22 1353 08/04/22 1911 08/05/22 0539  NA 132* 137 140  K 4.9 4.3 4.7  CL 85* 96* 100  CO2 33* 31 30  GLUCOSE 111* 121* 92  BUN 61* 51* 40*  CREATININE 4.20* 2.54* 1.23  CALCIUM 9.6 8.9 8.4*  MG  --  2.9*  --   PHOS  --  5.2*  --    GFR: CrCl cannot be calculated (Unknown ideal weight.). Liver Function Tests: Recent Labs  Lab 08/04/22 1911  AST 27  ALT 15  ALKPHOS 61  BILITOT 0.7  PROT 6.9  ALBUMIN 3.5   No results for input(s): "LIPASE", "AMYLASE" in the last 168 hours. No results for input(s): "AMMONIA" in the last 168 hours. Coagulation Profile: No results for input(s): "INR", "PROTIME" in the last 168 hours. Cardiac Enzymes: No results for input(s): "CKTOTAL", "CKMB", "CKMBINDEX", "TROPONINI" in the last 168 hours. BNP (last 3 results) No results for input(s): "PROBNP" in the last 8760 hours. HbA1C: No results for input(s): "HGBA1C" in the last 72 hours. CBG: No results for input(s): "GLUCAP" in the last 168 hours. Lipid Profile: No results for input(s): "CHOL", "HDL", "LDLCALC", "TRIG", "CHOLHDL", "LDLDIRECT" in the last 72 hours. Thyroid Function Tests: No results for input(s): "TSH", "T4TOTAL", "FREET4", "T3FREE", "THYROIDAB" in the last 72 hours. Anemia Panel: No results for input(s): "VITAMINB12", "FOLATE", "FERRITIN", "TIBC", "IRON", "RETICCTPCT" in the last 72 hours. Urine analysis:    Component Value Date/Time   COLORURINE YELLOW 08/04/2022 1353   APPEARANCEUR CLEAR 08/04/2022 1353   LABSPEC 1.013 08/04/2022 1353   PHURINE 7.0 08/04/2022 1353   GLUCOSEU NEGATIVE 08/04/2022 1353   HGBUR NEGATIVE 08/04/2022 1353   BILIRUBINUR NEGATIVE 08/04/2022 1353   KETONESUR 5 (A) 08/04/2022 1353   PROTEINUR NEGATIVE 08/04/2022 1353   UROBILINOGEN 0.2 06/12/2014 1400   NITRITE NEGATIVE 08/04/2022 1353   LEUKOCYTESUR NEGATIVE 08/04/2022 1353   Sepsis Labs: @LABRCNTIP (procalcitonin:4,lacticidven:4)  ) Recent Results (from the past 240 hour(s))   Respiratory (~20 pathogens) panel by PCR     Status: None   Collection Time: 08/04/22  6:56 PM   Specimen: Nasopharyngeal Swab; Respiratory  Result Value Ref Range Status   Adenovirus NOT DETECTED NOT DETECTED Final   Coronavirus 229E NOT DETECTED NOT DETECTED Final    Comment: (NOTE) The Coronavirus on the Respiratory Panel, DOES NOT test for the novel  Coronavirus (2019 nCoV)    Coronavirus HKU1 NOT DETECTED NOT DETECTED Final   Coronavirus NL63 NOT DETECTED NOT DETECTED Final   Coronavirus OC43 NOT DETECTED NOT DETECTED Final   Metapneumovirus NOT DETECTED NOT DETECTED Final   Rhinovirus / Enterovirus NOT DETECTED NOT DETECTED Final   Influenza A NOT DETECTED NOT DETECTED Final   Influenza B NOT DETECTED NOT DETECTED Final   Parainfluenza Virus 1 NOT DETECTED NOT DETECTED Final   Parainfluenza Virus 2 NOT DETECTED NOT DETECTED Final   Parainfluenza Virus 3 NOT DETECTED NOT DETECTED Final   Parainfluenza Virus 4 NOT DETECTED NOT DETECTED Final   Respiratory Syncytial Virus NOT DETECTED NOT DETECTED Final   Bordetella pertussis NOT DETECTED NOT DETECTED Final   Bordetella Parapertussis NOT DETECTED NOT DETECTED Final   Chlamydophila pneumoniae NOT DETECTED NOT DETECTED Final   Mycoplasma pneumoniae  NOT DETECTED NOT DETECTED Final    Comment: Performed at [facility], [place] [address], [place], [place] [address]  Resp panel by RT-PCR (RSV, Flu A&B, Covid) Anterior Nasal Swab     Status: None   Collection Time: 08/04/22  6:56 PM   Specimen: Anterior Nasal Swab  Result Value Ref Range Status   SARS Coronavirus 2 by RT PCR NEGATIVE NEGATIVE Final    Comment: (NOTE) SARS-CoV-2 target nucleic acids are NOT DETECTED.  The SARS-CoV-2 RNA is generally detectable in upper respiratory specimens during the acute phase of infection. The lowest concentration of SARS-CoV-2 viral copies this assay can detect is 138 copies/mL. A negative result does not preclude SARS-Cov-2 infection and  should not be used as the sole basis for treatment or other patient management decisions. A negative result may occur with  improper specimen collection/handling, submission of specimen other than nasopharyngeal swab, presence of viral mutation(s) within the areas targeted by this assay, and inadequate number of viral copies(<138 copies/mL). A negative result must be combined with clinical observations, patient history, and epidemiological information. The expected result is Negative.  Fact Sheet for Patients:  EntrepreneurPulse.com.au  Fact Sheet for Healthcare Providers:  IncredibleEmployment.be  This test is no t yet approved or cleared by the [place] FDA and  has been authorized for detection and/or diagnosis of SARS-CoV-2 by FDA under an Emergency Use Authorization (EUA). This EUA will remain  in effect (meaning this test can be used) for the duration of the COVID-19 declaration under Section 564(b)(1) of the Act, 21 U.S.C.section 360bbb-3(b)(1), unless the authorization is terminated  or revoked sooner.       Influenza A by PCR NEGATIVE NEGATIVE Final   Influenza B by PCR NEGATIVE NEGATIVE Final    Comment: (NOTE) The Xpert Xpress SARS-CoV-2/FLU/RSV plus assay is intended as an aid in the diagnosis of influenza from Nasopharyngeal swab specimens and should not be used as a sole basis for treatment. Nasal washings and aspirates are unacceptable for Xpert Xpress SARS-CoV-2/FLU/RSV testing.  Fact Sheet for Patients: EntrepreneurPulse.com.au  Fact Sheet for Healthcare Providers: IncredibleEmployment.be  This test is not yet approved or cleared by the [place] FDA and has been authorized for detection and/or diagnosis of SARS-CoV-2 by FDA under an Emergency Use Authorization (EUA). This EUA will remain in effect (meaning this test can be used) for the duration of the COVID-19 declaration  under Section 564(b)(1) of the Act, 21 U.S.C. section 360bbb-3(b)(1), unless the authorization is terminated or revoked.     Resp Syncytial Virus by PCR NEGATIVE NEGATIVE Final    Comment: (NOTE) Fact Sheet for Patients: EntrepreneurPulse.com.au  Fact Sheet for Healthcare Providers: IncredibleEmployment.be  This test is not yet approved or cleared by the [place] FDA and has been authorized for detection and/or diagnosis of SARS-CoV-2 by FDA under an Emergency Use Authorization (EUA). This EUA will remain in effect (meaning this test can be used) for the duration of the COVID-19 declaration under Section 564(b)(1) of the Act, 21 U.S.C. section 360bbb-3(b)(1), unless the authorization is terminated or revoked.  Performed at [facility], [place] [address], [place], [place] [address]          Radiology Studies: DG CHEST PORT 1 VIEW  Result Date: 08/05/2022 CLINICAL DATA:  Shortness of breath. EXAM: PORTABLE CHEST 1 VIEW COMPARISON:  Radiograph yesterday. FINDINGS: Chronic hyperinflation with apical predominant emphysema. Bronchovascular crowding at the lung bases likely due to emphysematous change. The heart is normal in  size. Stable mediastinal contours. Aortic atherosclerosis. No pulmonary edema, pleural effusion or pneumothorax. Stable osseous structures. IMPRESSION: Emphysema without acute abnormality. Electronically Signed   By: Keith Rake M.D.   On: 08/05/2022 15:15   US RENAL  Result Date: 08/04/2022 CLINICAL DATA:  Acute kidney insufficiency EXAM: RENAL / URINARY TRACT ULTRASOUND COMPLETE COMPARISON:  CT 08/04/2022 earlier FINDINGS: Right Kidney: Renal measurements: 11.5 x 5.3 x 5.4 = volume: 172.8 mL. No collecting system dilatation perinephric fluid. There is a parapelvic right-sided renal cyst as seen on CT. This measures up to 2.7 cm by ultrasound. Extrarenal pelvis noted on the right which is more distended  than on the prior CT scan. Left Kidney: Renal measurements: 11.9 x 5.5 x 6.1 = volume: 209.4 mL. Extrarenal pelvis of the left kidney is also noted and similar to previous when adjusting for technique. No collecting system dilatation or perinephric fluid. Bladder: Bladder is contracted with a Foley catheter. Other: Study somewhat limited by breathing motion as per the sonographer in overlapping soft tissue IMPRESSION: Extrarenal pelvis noted to each kidney. No separate collecting system dilatation. Parapelvic right-sided renal cyst as seen on CT. Electronically Signed   By: Jill Side M.D.   On: 08/04/2022 17:54   CT Renal Stone Study  Result Date: 08/04/2022 CLINICAL DATA:  Flank pain. Difficulty urinating. 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 RADIATION DOSE REDUCTION: This exam was performed according to the departmental dose-optimization program which includes automated exposure control, adjustment of the mA and/or kV according to patient size and/or use of iterative reconstruction technique. COMPARISON:  Whole-body bone scan 12/07/2021.  CT scan 12/06/2021 FINDINGS: Lower chest: Breathing motion identified. Advanced centrilobular emphysematous changes are seen along the lung bases. No pleural effusion. There is some subtle opacity dependently along the right lower lobe. Atelectasis is favored. Small hiatal hernia. Hepatobiliary: No focal liver abnormality is seen. No gallstones, gallbladder wall thickening, or biliary dilatation. Pancreas: Unremarkable. No pancreatic ductal dilatation or surrounding inflammatory changes. Spleen: Normal in size without focal abnormality.  Small splenule Adrenals/Urinary Tract: The adrenal glands are preserved. Perinephric stranding identified bilaterally,  right-greater-than-left. Extrarenal pelvis noted bilaterally with slight ectasia of the right renal collecting system greater than left but both ureters are ectatic down to the level of the bladder. The bladder is contracted with a Foley catheter. There are some calcifications seen within the bladder but near the margin of the right UVJ. Component of UVJ calcification on the right is possible. No ureteral stones otherwise identified. Punctate lower pole right-sided renal stone. Previously there were bladder stones. There is a calcification seen measuring 6 mm on series 2, image 78 just to the right of the course of the prosthetic urethra. This could be calcification with in the prostate. Stomach/Bowel: Normal retrocecal appendix. Moderate colonic stool. The bowel is nondilated. This includes stomach, small bowel and large bowel. Vascular/Lymphatic: Extensive vascular calcifications along the aorta and branch vessels. There is ectatic appearance to the celiac with diameter approaching 14 mm. Preserved IVC. No specific abnormal lymph node enlargement seen in the abdomen and pelvis. Reproductive: Enlarged appearance of the left seminal vesicle but unchanged from prior. There is also ill-defined margin along the prostate. Please correlate with the history of prostate neoplasm Other: Scattered retroperitoneal stranding extending down from the kidneys to the pelvis. This has increased from the  prior CT scan. Musculoskeletal: Curvature of the spine. Scattered degenerative changes of the spine and pelvis. There are bridging osteophytes along the sacroiliac joints. Trace anterolisthesis of L5 on S1 with pars defects. There are scattered areas of bony sclerosis seen throughout the skeleton consistent with known osseous metastatic disease. Compared to the prior CT scan these are increased in extent and distribution. Follow up bone scan as clinically appropriate IMPRESSION: Interval development of mild bilateral renal  collecting system dilatation, right-greater-than-left down to the bladder. There are some calcifications along the bladder on the right side near the margin of the right UVJ. Otherwise no ureteral stones. Punctate nonobstructing right-sided renal stone. Etiology of the dilatation is uncertain overall. Postcontrast study could be considered with delayed imaging as clinically appropriate. The bladder is contracted with a Foley catheter. Increasing retroperitoneal stranding diffusely. Heterogeneous prostate with enlarged appearance of the left seminal vesicle. Please correlate for known history of prostate neoplasm. In addition there is increasing extent and distribution of sclerotic bone metastases. A follow up bone scan could be considered when clinically appropriate. No bowel obstruction, free air. Normal appendix. Small hiatal hernia. Diffuse atherosclerotic changes. 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 Electronically Signed   By: Jill Side M.D.   On: 08/04/2022 17:14   DG Chest Portable 1 View  Result Date: 08/04/2022 CLINICAL DATA:  Shortness of breath. Inability to urinate for 3 days. History of metastatic prostate cancer. EXAM: PORTABLE CHEST 1 VIEW COMPARISON:  Radiographs 03/31/2020 and 02/28/2020. FINDINGS: 1249 hours. The heart size and mediastinal contours are stable. There is aortic atherosclerosis. The lungs are hyperinflated but clear. No pleural effusion or pneumothorax. No acute osseous findings are evident. Telemetry leads overlie the chest. IMPRESSION: Chronic obstructive pulmonary disease. No evidence of acute cardiopulmonary process. Electronically Signed   By: Richardean Sale M.D.   On: 08/04/2022 13:33        Scheduled Meds:  budesonide (PULMICORT) nebulizer solution  0.25 mg Nebulization BID   fluticasone  1 spray Each Nare Daily   furosemide  20 mg Intravenous Once   heparin  5,000 Units Subcutaneous BID   ipratropium-albuterol  3 mL Nebulization Q6H    methylPREDNISolone (SOLU-MEDROL) injection  40 mg Intravenous Q12H   tamsulosin  0.4 mg Oral Daily   Continuous Infusions:   LOS: 0 days    Time spent: 55 minutes.    Dana Allan, MD  [facility] Pager #: [phone] 7PM-7AM contact night coverage as above

## 2022-08-05 NOTE — Consult Note (Signed)
Consultation: Urinary retention, acute kidney injury, metastatic prostate cancer.  Requested by: Dr. Dana Allan  History of Present Illness:  67 year old male patient of Dr. Bess Harvest with a history of BPH and lower urinary tract symptoms.  He had elevated postvoid was started on tamsulosin last year.  He also has a history of metastatic prostate cancer on Eligard and recently started on abiraterone.  He presented with difficulty voiding and a distended bladder.  Foley catheter was placed with relief of symptoms and CT scan showed with bilateral hydronephrosis and Foley decompressed with the Foley.  F/u renal US with improvement of hydro. His creatinine decreased from 4.2-1.2.  Urology consulted for recommendations for the Foley catheter and prostate cancer.   Pt c/o trouble breathing.   Past Medical History:  Diagnosis Date   Arthritis    Cancer ([place])    COPD (chronic obstructive pulmonary disease) ([place]) 06/05/2012   Dyspnea    Dysrhythmia    Emphysema 06/07/2012   Heart murmur    hx of as baby   History of kidney stones    Hypoxemia 06/07/2012   Obstructive chronic bronchitis with exacerbation ([place]) 06/07/2012   Pneumonia    Tobacco abuse 06/05/2012   Past Surgical History:  Procedure Laterality Date   CATARACT EXTRACTION, BILATERAL     CYSTOSCOPY WITH LITHOLAPAXY N/A 01/13/2022   Procedure: CYSTOSCOPY WITH LITHOLAPAXY;  Surgeon: Alexis Frock, MD;  Location: [facility];  Service: Urology;  Laterality: N/A;   CYSTOSCOPY/RETROGRADE/URETEROSCOPY/STONE EXTRACTION WITH BASKET Right 10/04/2016   Procedure: CYSTOSCOPY/RETROGRADE/URETEROSCOPY/STONE EXTRACTION WITH BASKET/ STENT;  Surgeon: Alexis Frock, MD;  Location: [facility];  Service: Urology;  Laterality: Right;  With LASER   CYSTOSCOPY/URETEROSCOPY/HOLMIUM LASER/STENT PLACEMENT Right 01/13/2022   Procedure: CYSTOSCOPY/URETEROSCOPY/HOLMIUM LASER/STENT PLACEMENT;  Surgeon: Alexis Frock, MD;  Location: [facility];  Service: Urology;   Laterality: Right;   EXTRACORPOREAL SHOCK WAVE LITHOTRIPSY Right 06/29/2016   Procedure: RIGHT EXTRACORPOREAL SHOCK WAVE LITHOTRIPSY (ESWL);  Surgeon: Ardis Hughs, MD;  Location: [facility];  Service: Urology;  Laterality: Right;    Home Medications:  Medications Prior to Admission  Medication Sig Dispense Refill Last Dose   albuterol (ACCUNEB) 0.63 MG/3ML nebulizer solution USE 1 VIAL IN NEBULIZER EVERY 6 HOURS 360 mL 5    albuterol (VENTOLIN HFA) 108 (90 Base) MCG/ACT inhaler INHALE 2 PUFFS BY MOUTH EVERY 4 HOURS AS NEEDED FOR WHEEZING OR SHORTNESS OF BREATH 90 g 0    fluticasone (FLONASE) 50 MCG/ACT nasal spray Place 1 spray into both nostrils daily. 16 g 3    Fluticasone-Umeclidin-Vilant (TRELEGY ELLIPTA) 100-62.5-25 MCG/ACT AEPB INHALE 1 PUFF INTO THE LUNGS DAILY 180 each 0    guaiFENesin (MUCINEX) 600 MG 12 hr tablet Take 600 mg by mouth 2 (two) times daily as needed for to loosen phlegm or cough.      naproxen sodium (ALEVE) 220 MG tablet Take 220 mg by mouth daily as needed (pain).      oxyCODONE-acetaminophen (PERCOCET) 5-325 MG tablet Take 1 tablet by mouth every 6 (six) hours as needed for severe pain or moderate pain (post-operatively). 15 tablet 0    OXYGEN Inhale 4 L/min into the lungs continuous.       predniSONE (DELTASONE) 10 MG tablet TAKE 1 TABLET(10 MG) BY MOUTH DAILY WITH BREAKFAST 90 tablet 0    predniSONE (DELTASONE) 5 MG tablet Take 5 mg by mouth daily with breakfast.      tamsulosin (FLOMAX) 0.4 MG CAPS capsule TAKE 1 CAPSULE(0.4 MG) BY MOUTH DAILY 90 capsule 1  Allergies: No Known Allergies  Family History  Problem Relation Age of Onset   COPD Mother    Colon polyps Father    Heart attack Father 58   Colon polyps Sister    Social History:  reports that he quit smoking about 7 years ago. His smoking use included cigarettes. He has a 17.50 pack-year smoking history. He quit smokeless tobacco use about 54 years ago.  His smokeless tobacco use included chew. He  reports that he does not drink alcohol and does not use drugs.  ROS: A complete review of systems was performed.  All systems are negative except for pertinent findings as noted. Review of Systems  Respiratory:  Positive for shortness of breath.   All other systems reviewed and are negative.    Physical Exam:  Vital signs in last 24 hours: Temp:  [97.7 F (36.5 C)-98.7 F (37.1 C)] 97.9 F (36.6 C) (03/30 0410) Pulse Rate:  [94-116] 94 (03/30 0410) Resp:  [19-30] 21 (03/30 0410) BP: (111-130)/(56-87) 113/56 (03/30 0410) SpO2:  [89 %-98 %] 91 % (03/30 0848) General:  Alert and oriented, No acute distress HEENT: Normocephalic, atraumatic Cardiovascular: Regular rate and rhythm Lungs: Regular rate and effort, pursed lip breathing  Abdomen: Soft, nontender, nondistended, no abdominal masses Extremities: No edema Neurologic: Grossly intact GU: Foley catheter in place urine clear  Laboratory Data:  Results for orders placed or performed during the hospital encounter of 08/04/22 (from the past 24 hour(s))  CBC with Differential     Status: Abnormal   Collection Time: 08/04/22  1:53 PM  Result Value Ref Range   WBC 17.1 (H) 4.0 - 10.5 K/uL   RBC 4.74 4.22 - 5.81 MIL/uL   Hemoglobin 13.5 13.0 - 17.0 g/dL   HCT 42.3 39.0 - 52.0 %   MCV 89.2 80.0 - 100.0 fL   MCH 28.5 26.0 - 34.0 pg   MCHC 31.9 30.0 - 36.0 g/dL   RDW 12.4 11.5 - 15.5 %   Platelets 194 150 - 400 K/uL   nRBC 0.0 0.0 - 0.2 %   Neutrophils Relative % 86 %   Neutro Abs 14.6 (H) 1.7 - 7.7 K/uL   Lymphocytes Relative 4 %   Lymphs Abs 0.7 0.7 - 4.0 K/uL   Monocytes Relative 10 %   Monocytes Absolute 1.7 (H) 0.1 - 1.0 K/uL   Eosinophils Relative 0 %   Eosinophils Absolute 0.0 0.0 - 0.5 K/uL   Basophils Relative 0 %   Basophils Absolute 0.0 0.0 - 0.1 K/uL   Immature Granulocytes 0 %   Abs Immature Granulocytes 0.06 0.00 - 0.07 K/uL  Basic metabolic panel     Status: Abnormal   Collection Time: 08/04/22  1:53 PM   Result Value Ref Range   Sodium 132 (L) 135 - 145 mmol/L   Potassium 4.9 3.5 - 5.1 mmol/L   Chloride 85 (L) 98 - 111 mmol/L   CO2 33 (H) 22 - 32 mmol/L   Glucose, Bld 111 (H) 70 - 99 mg/dL   BUN 61 (H) 8 - 23 mg/dL   Creatinine, Ser 4.20 (H) 0.61 - 1.24 mg/dL   Calcium 9.6 8.9 - 10.3 mg/dL   GFR, Estimated 15 (L) >60 mL/min   Anion gap 14 5 - 15  Urinalysis, Routine w reflex microscopic -Urine, Clean Catch     Status: Abnormal   Collection Time: 08/04/22  1:53 PM  Result Value Ref Range   Color, Urine YELLOW YELLOW   APPearance CLEAR  CLEAR   Specific Gravity, Urine 1.013 1.005 - 1.030   pH 7.0 5.0 - 8.0   Glucose, UA NEGATIVE NEGATIVE mg/dL   Hgb urine dipstick NEGATIVE NEGATIVE   Bilirubin Urine NEGATIVE NEGATIVE   Ketones, ur 5 (A) NEGATIVE mg/dL   Protein, ur NEGATIVE NEGATIVE mg/dL   Nitrite NEGATIVE NEGATIVE   Leukocytes,Ua NEGATIVE NEGATIVE  Brain natriuretic peptide     Status: None   Collection Time: 08/04/22  1:53 PM  Result Value Ref Range   B Natriuretic Peptide 49.0 0.0 - 100.0 pg/mL  Troponin I (High Sensitivity)     Status: None   Collection Time: 08/04/22  1:53 PM  Result Value Ref Range   Troponin I (High Sensitivity) 11 <18 ng/L  Sodium, urine, random     Status: None   Collection Time: 08/04/22  2:45 PM  Result Value Ref Range   Sodium, Ur 105 mmol/L  Troponin I (High Sensitivity)     Status: None   Collection Time: 08/04/22  5:47 PM  Result Value Ref Range   Troponin I (High Sensitivity) 11 <18 ng/L  Blood gas, arterial     Status: Abnormal   Collection Time: 08/04/22  6:36 PM  Result Value Ref Range   Delivery systems NO CHARGE    pH, Arterial 7.36 7.35 - 7.45   pCO2 arterial 67 (HH) 32 - 48 mmHg   pO2, Arterial 72 (L) 83 - 108 mmHg   Bicarbonate 37.5 (H) 20.0 - 28.0 mmol/L   Acid-Base Excess 9.7 (H) 0.0 - 2.0 mmol/L   O2 Saturation 98.4 %   Patient temperature 36.5    Allens test (pass/fail) PASS PASS  Respiratory (~20 pathogens) panel by  PCR     Status: None   Collection Time: 08/04/22  6:56 PM   Specimen: Nasopharyngeal Swab; Respiratory  Result Value Ref Range   Adenovirus NOT DETECTED NOT DETECTED   Coronavirus 229E NOT DETECTED NOT DETECTED   Coronavirus HKU1 NOT DETECTED NOT DETECTED   Coronavirus NL63 NOT DETECTED NOT DETECTED   Coronavirus OC43 NOT DETECTED NOT DETECTED   Metapneumovirus NOT DETECTED NOT DETECTED   Rhinovirus / Enterovirus NOT DETECTED NOT DETECTED   Influenza A NOT DETECTED NOT DETECTED   Influenza B NOT DETECTED NOT DETECTED   Parainfluenza Virus 1 NOT DETECTED NOT DETECTED   Parainfluenza Virus 2 NOT DETECTED NOT DETECTED   Parainfluenza Virus 3 NOT DETECTED NOT DETECTED   Parainfluenza Virus 4 NOT DETECTED NOT DETECTED   Respiratory Syncytial Virus NOT DETECTED NOT DETECTED   Bordetella pertussis NOT DETECTED NOT DETECTED   Bordetella Parapertussis NOT DETECTED NOT DETECTED   Chlamydophila pneumoniae NOT DETECTED NOT DETECTED   Mycoplasma pneumoniae NOT DETECTED NOT DETECTED  Resp panel by RT-PCR (RSV, Flu A&B, Covid) Anterior Nasal Swab     Status: None   Collection Time: 08/04/22  6:56 PM   Specimen: Anterior Nasal Swab  Result Value Ref Range   SARS Coronavirus 2 by RT PCR NEGATIVE NEGATIVE   Influenza A by PCR NEGATIVE NEGATIVE   Influenza B by PCR NEGATIVE NEGATIVE   Resp Syncytial Virus by PCR NEGATIVE NEGATIVE  HIV Antibody (routine testing w rflx)     Status: None   Collection Time: 08/04/22  7:11 PM  Result Value Ref Range   HIV Screen 4th Generation wRfx Non Reactive Non Reactive  Comprehensive metabolic panel     Status: Abnormal   Collection Time: 08/04/22  7:11 PM  Result Value Ref Range  Sodium 137 135 - 145 mmol/L   Potassium 4.3 3.5 - 5.1 mmol/L   Chloride 96 (L) 98 - 111 mmol/L   CO2 31 22 - 32 mmol/L   Glucose, Bld 121 (H) 70 - 99 mg/dL   BUN 51 (H) 8 - 23 mg/dL   Creatinine, Ser 2.54 (H) 0.61 - 1.24 mg/dL   Calcium 8.9 8.9 - 10.3 mg/dL   Total Protein 6.9  6.5 - 8.1 g/dL   Albumin 3.5 3.5 - 5.0 g/dL   AST 27 15 - 41 U/L   ALT 15 0 - 44 U/L   Alkaline Phosphatase 61 38 - 126 U/L   Total Bilirubin 0.7 0.3 - 1.2 mg/dL   GFR, Estimated 27 (L) >60 mL/min   Anion gap 10 5 - 15  Magnesium     Status: Abnormal   Collection Time: 08/04/22  7:11 PM  Result Value Ref Range   Magnesium 2.9 (H) 1.7 - 2.4 mg/dL  Phosphorus     Status: Abnormal   Collection Time: 08/04/22  7:11 PM  Result Value Ref Range   Phosphorus 5.2 (H) 2.5 - 4.6 mg/dL  CBC with Differential/Platelet     Status: Abnormal   Collection Time: 08/04/22  7:11 PM  Result Value Ref Range   WBC 13.5 (H) 4.0 - 10.5 K/uL   RBC 4.45 4.22 - 5.81 MIL/uL   Hemoglobin 12.9 (L) 13.0 - 17.0 g/dL   HCT 40.6 39.0 - 52.0 %   MCV 91.2 80.0 - 100.0 fL   MCH 29.0 26.0 - 34.0 pg   MCHC 31.8 30.0 - 36.0 g/dL   RDW 12.6 11.5 - 15.5 %   Platelets 187 150 - 400 K/uL   nRBC 0.0 0.0 - 0.2 %   Neutrophils Relative % 98 %   Neutro Abs 13.1 (H) 1.7 - 7.7 K/uL   Lymphocytes Relative 1 %   Lymphs Abs 0.2 (L) 0.7 - 4.0 K/uL   Monocytes Relative 1 %   Monocytes Absolute 0.2 0.1 - 1.0 K/uL   Eosinophils Relative 0 %   Eosinophils Absolute 0.0 0.0 - 0.5 K/uL   Basophils Relative 0 %   Basophils Absolute 0.0 0.0 - 0.1 K/uL   Immature Granulocytes 0 %   Abs Immature Granulocytes 0.05 0.00 - 0.07 K/uL  Basic metabolic panel     Status: Abnormal   Collection Time: 08/05/22  5:39 AM  Result Value Ref Range   Sodium 140 135 - 145 mmol/L   Potassium 4.7 3.5 - 5.1 mmol/L   Chloride 100 98 - 111 mmol/L   CO2 30 22 - 32 mmol/L   Glucose, Bld 92 70 - 99 mg/dL   BUN 40 (H) 8 - 23 mg/dL   Creatinine, Ser 1.23 0.61 - 1.24 mg/dL   Calcium 8.4 (L) 8.9 - 10.3 mg/dL   GFR, Estimated >60 >60 mL/min   Anion gap 10 5 - 15  CBC     Status: Abnormal   Collection Time: 08/05/22  5:39 AM  Result Value Ref Range   WBC 10.7 (H) 4.0 - 10.5 K/uL   RBC 3.75 (L) 4.22 - 5.81 MIL/uL   Hemoglobin 10.8 (L) 13.0 - 17.0 g/dL    HCT 35.3 (L) 39.0 - 52.0 %   MCV 94.1 80.0 - 100.0 fL   MCH 28.8 26.0 - 34.0 pg   MCHC 30.6 30.0 - 36.0 g/dL   RDW 12.7 11.5 - 15.5 %   Platelets 162 150 - 400 K/uL  nRBC 0.0 0.0 - 0.2 %   Recent Results (from the past 240 hour(s))  Respiratory (~20 pathogens) panel by PCR     Status: None   Collection Time: 08/04/22  6:56 PM   Specimen: Nasopharyngeal Swab; Respiratory  Result Value Ref Range Status   Adenovirus NOT DETECTED NOT DETECTED Final   Coronavirus 229E NOT DETECTED NOT DETECTED Final    Comment: (NOTE) The Coronavirus on the Respiratory Panel, DOES NOT test for the novel  Coronavirus (2019 nCoV)    Coronavirus HKU1 NOT DETECTED NOT DETECTED Final   Coronavirus NL63 NOT DETECTED NOT DETECTED Final   Coronavirus OC43 NOT DETECTED NOT DETECTED Final   Metapneumovirus NOT DETECTED NOT DETECTED Final   Rhinovirus / Enterovirus NOT DETECTED NOT DETECTED Final   Influenza A NOT DETECTED NOT DETECTED Final   Influenza B NOT DETECTED NOT DETECTED Final   Parainfluenza Virus 1 NOT DETECTED NOT DETECTED Final   Parainfluenza Virus 2 NOT DETECTED NOT DETECTED Final   Parainfluenza Virus 3 NOT DETECTED NOT DETECTED Final   Parainfluenza Virus 4 NOT DETECTED NOT DETECTED Final   Respiratory Syncytial Virus NOT DETECTED NOT DETECTED Final   Bordetella pertussis NOT DETECTED NOT DETECTED Final   Bordetella Parapertussis NOT DETECTED NOT DETECTED Final   Chlamydophila pneumoniae NOT DETECTED NOT DETECTED Final   Mycoplasma pneumoniae NOT DETECTED NOT DETECTED Final    Comment: Performed at [facility], [place]. [address] [place], [place] [address]  Resp panel by RT-PCR (RSV, Flu A&B, Covid) Anterior Nasal Swab     Status: None   Collection Time: 08/04/22  6:56 PM   Specimen: Anterior Nasal Swab  Result Value Ref Range Status   SARS Coronavirus 2 by RT PCR NEGATIVE NEGATIVE Final    Comment: (NOTE) SARS-CoV-2 target nucleic acids are NOT DETECTED.  The SARS-CoV-2 RNA is  generally detectable in upper respiratory specimens during the acute phase of infection. The lowest concentration of SARS-CoV-2 viral copies this assay can detect is 138 copies/mL. A negative result does not preclude SARS-Cov-2 infection and should not be used as the sole basis for treatment or other patient management decisions. A negative result may occur with  improper specimen collection/handling, submission of specimen other than nasopharyngeal swab, presence of viral mutation(s) within the areas targeted by this assay, and inadequate number of viral copies(<138 copies/mL). A negative result must be combined with clinical observations, patient history, and epidemiological information. The expected result is Negative.  Fact Sheet for Patients:  EntrepreneurPulse.com.au  Fact Sheet for Healthcare Providers:  IncredibleEmployment.be  This test is no t yet approved or cleared by the [place] FDA and  has been authorized for detection and/or diagnosis of SARS-CoV-2 by FDA under an Emergency Use Authorization (EUA). This EUA will remain  in effect (meaning this test can be used) for the duration of the COVID-19 declaration under Section 564(b)(1) of the Act, 21 U.S.C.section 360bbb-3(b)(1), unless the authorization is terminated  or revoked sooner.       Influenza A by PCR NEGATIVE NEGATIVE Final   Influenza B by PCR NEGATIVE NEGATIVE Final    Comment: (NOTE) The Xpert Xpress SARS-CoV-2/FLU/RSV plus assay is intended as an aid in the diagnosis of influenza from Nasopharyngeal swab specimens and should not be used as a sole basis for treatment. Nasal washings and aspirates are unacceptable for Xpert Xpress SARS-CoV-2/FLU/RSV testing.  Fact Sheet for Patients: EntrepreneurPulse.com.au  Fact Sheet for Healthcare Providers: IncredibleEmployment.be  This test is not yet approved  or cleared by the [place] and has been authorized for detection and/or diagnosis of SARS-CoV-2 by FDA under an Emergency Use Authorization (EUA). This EUA will remain in effect (meaning this test can be used) for the duration of the COVID-19 declaration under Section 564(b)(1) of the Act, 21 U.S.C. section 360bbb-3(b)(1), unless the authorization is terminated or revoked.     Resp Syncytial Virus by PCR NEGATIVE NEGATIVE Final    Comment: (NOTE) Fact Sheet for Patients: EntrepreneurPulse.com.au  Fact Sheet for Healthcare Providers: IncredibleEmployment.be  This test is not yet approved or cleared by the [place] FDA and has been authorized for detection and/or diagnosis of SARS-CoV-2 by FDA under an Emergency Use Authorization (EUA). This EUA will remain in effect (meaning this test can be used) for the duration of the COVID-19 declaration under Section 564(b)(1) of the Act, 21 U.S.C. section 360bbb-3(b)(1), unless the authorization is terminated or revoked.  Performed at [facility], [place] [address] [place], [place] [address]    Creatinine: Recent Labs    08/04/22 1353 08/04/22 1911 08/05/22 0539  CREATININE 4.20* 2.54* 1.23    Impression/Assessment:  BPH, urinary retention, metastatic prostate cancer-  Plan:  Continue Foley catheter.  [facility] for voiding trial in 5 to 10 days.  Call office for appointment.    Considered bicalutamide but it can have pulm toxicity. Will await abiraterone start.   Festus Aloe 08/05/2022, 12:33 PM

## 2022-08-06 DIAGNOSIS — J441 Chronic obstructive pulmonary disease with (acute) exacerbation: Secondary | ICD-10-CM | POA: Diagnosis not present

## 2022-08-06 LAB — CBC WITH DIFFERENTIAL/PLATELET
Abs Immature Granulocytes: 0.14 10*3/uL — ABNORMAL HIGH (ref 0.00–0.07)
Basophils Absolute: 0 10*3/uL (ref 0.0–0.1)
Basophils Relative: 0 %
Eosinophils Absolute: 0 10*3/uL (ref 0.0–0.5)
Eosinophils Relative: 0 %
HCT: 35.1 % — ABNORMAL LOW (ref 39.0–52.0)
Hemoglobin: 10.9 g/dL — ABNORMAL LOW (ref 13.0–17.0)
Immature Granulocytes: 1 %
Lymphocytes Relative: 4 %
Lymphs Abs: 0.5 10*3/uL — ABNORMAL LOW (ref 0.7–4.0)
MCH: 29.2 pg (ref 26.0–34.0)
MCHC: 31.1 g/dL (ref 30.0–36.0)
MCV: 94.1 fL (ref 80.0–100.0)
Monocytes Absolute: 0.9 10*3/uL (ref 0.1–1.0)
Monocytes Relative: 8 %
Neutro Abs: 10.3 10*3/uL — ABNORMAL HIGH (ref 1.7–7.7)
Neutrophils Relative %: 87 %
Platelets: 169 10*3/uL (ref 150–400)
RBC: 3.73 MIL/uL — ABNORMAL LOW (ref 4.22–5.81)
RDW: 12.5 % (ref 11.5–15.5)
WBC: 11.8 10*3/uL — ABNORMAL HIGH (ref 4.0–10.5)
nRBC: 0 % (ref 0.0–0.2)

## 2022-08-06 LAB — MAGNESIUM: Magnesium: 2.1 mg/dL (ref 1.7–2.4)

## 2022-08-06 LAB — RENAL FUNCTION PANEL
Albumin: 3 g/dL — ABNORMAL LOW (ref 3.5–5.0)
Anion gap: 9 (ref 5–15)
BUN: 32 mg/dL — ABNORMAL HIGH (ref 8–23)
CO2: 35 mmol/L — ABNORMAL HIGH (ref 22–32)
Calcium: 8.8 mg/dL — ABNORMAL LOW (ref 8.9–10.3)
Chloride: 96 mmol/L — ABNORMAL LOW (ref 98–111)
Creatinine, Ser: 0.78 mg/dL (ref 0.61–1.24)
GFR, Estimated: >60 mL/min (ref >60)
Glucose, Bld: 124 mg/dL — ABNORMAL HIGH (ref 70–99)
Phosphorus: 2.8 mg/dL (ref 2.5–4.6)
Potassium: 4.2 mmol/L (ref 3.5–5.1)
Sodium: 140 mmol/L (ref 135–145)

## 2022-08-06 MED ORDER — LORATADINE 10 MG PO TABS
10.0000 mg | ORAL_TABLET | Freq: Every day | ORAL | Status: DC
  Administered 2022-08-06 – 2022-08-11 (×6): 10 mg via ORAL
  Filled 2022-08-06 (×6): qty 1

## 2022-08-06 MED ORDER — FLUTICASONE PROPIONATE 50 MCG/ACT NA SUSP
2.0000 | Freq: Two times a day (BID) | NASAL | Status: DC
  Administered 2022-08-06 – 2022-08-11 (×10): 2 via NASAL

## 2022-08-06 MED ORDER — AZITHROMYCIN 250 MG PO TABS
250.0000 mg | ORAL_TABLET | Freq: Every day | ORAL | Status: AC
  Administered 2022-08-06 – 2022-08-10 (×5): 250 mg via ORAL
  Filled 2022-08-06 (×5): qty 1

## 2022-08-06 MED ORDER — SALINE SPRAY 0.65 % NA SOLN
1.0000 | NASAL | Status: DC | PRN
  Filled 2022-08-06: qty 44

## 2022-08-06 MED ORDER — IPRATROPIUM-ALBUTEROL 0.5-2.5 (3) MG/3ML IN SOLN
3.0000 mL | Freq: Four times a day (QID) | RESPIRATORY_TRACT | Status: DC
  Administered 2022-08-06 – 2022-08-09 (×10): 3 mL via RESPIRATORY_TRACT
  Filled 2022-08-06 (×10): qty 3

## 2022-08-06 NOTE — Evaluation (Signed)
Physical Therapy Evaluation Patient Details Name: Frank [name] MRN: [id number] DOB: 1955/09/23 Today's Date: 08/06/2022  History of Present Illness  Patient is a 67 year old male who presented with SOB,and inability to pass urine.  Patient was admitted with COPD exacerbation, acute on chronic respiratory failure, AKI, and urinary retention. PMH: prostate cancer, BPH, UTI symptoms, cancer, nephrolithiasis.  Clinical Impression  Pt admitted with above diagnosis.  Pt currently with functional limitations due to the deficits listed below (see PT Problem List). Pt will benefit from acute skilled PT to increase their independence and safety with mobility to allow discharge.  Pt typically ambulatory without assistive device and utilized RW today for safety and endurance.  Pt requiring increased supplemental oxygen.   SPO2 98% on 4L at rest (wears 4L at baseline) 79% on 4L during ambulation so increased to 6L for 93% Pt did become dizzy with turning around from window (after breathing rest break) so RN said increase to 8-10L for returning to room. 89% on 10L upon return to room 97% on 8L after a few minutes rest 97% back on 6L upon therapist leaving room (pt on 6L upon arrival to room)        Recommendations for follow up therapy are one component of a multi-disciplinary discharge planning process, led by the attending physician.  Recommendations may be updated based on patient status, additional functional criteria and insurance authorization.  Follow Up Recommendations       Assistance Recommended at Discharge PRN  Patient can return home with the following  Assistance with cooking/housework;Help with stairs or ramp for entrance    Equipment Recommendations Rollator (4 wheels)  Recommendations for Other Services       Functional Status Assessment Patient has had a recent decline in their functional status and demonstrates the ability to make significant improvements in function in a  reasonable and predictable amount of time.     Precautions / Restrictions Precautions Precautions: Fall Precaution Comments: monitor O2, foley Restrictions Weight Bearing Restrictions: No      Mobility  Bed Mobility               General bed mobility comments: pt in recliner    Transfers Overall transfer level: Needs assistance Equipment used: Rolling walker (2 wheels) Transfers: Sit to/from Stand Sit to Stand: Min guard           General transfer comment: cues for hand placement    Ambulation/Gait Ambulation/Gait assistance: Min guard Gait Distance (Feet): 160 Feet Assistive device: Rolling walker (2 wheels) Gait Pattern/deviations: Step-through pattern, Decreased stride length       General Gait Details: pt requested RW, utilized for endurance and safety; pt requiring increased oxygen with exertion, long standing rest break for breathing with cues for pursed lip breathing  Stairs            Wheelchair Mobility    Modified Rankin (Stroke Patients Only)       Balance Overall balance assessment: Needs assistance         Standing balance support: No upper extremity supported Standing balance-Leahy Scale: Fair Standing balance comment: static fair                             Pertinent Vitals/Pain Pain Assessment Pain Assessment: No/denies pain    Home Living Family/patient expects to be discharged to:: Private residence Living Arrangements: Alone Available Help at Discharge: Family;Available PRN/intermittently Type of Home: [place]  Access: Level entry       Home Layout: Two level;Able to live on main level with bedroom/bathroom Home Equipment: Cane - single point;Rolling Walker (2 wheels);Shower seat Additional Comments: uses 4L/min O2 at home    Prior Function Prior Level of Function : Independent/Modified Independent                     Hand Dominance   Dominant Hand: Right    Extremity/Trunk  Assessment   Upper Extremity Assessment Upper Extremity Assessment: Overall WFL for tasks assessed    Lower Extremity Assessment Lower Extremity Assessment: Generalized weakness    Cervical / Trunk Assessment Cervical / Trunk Assessment: Kyphotic  Communication   Communication: Other (comment) (dyspnea limits talking)  Cognition Arousal/Alertness: Awake/alert Behavior During Therapy: WFL for tasks assessed/performed Overall Cognitive Status: Within Functional Limits for tasks assessed                                          General Comments      Exercises     Assessment/Plan    PT Assessment Patient needs continued PT services  PT Problem List Decreased strength;Decreased activity tolerance;Decreased balance;Decreased mobility;Decreased knowledge of use of DME;Cardiopulmonary status limiting activity       PT Treatment Interventions Gait training;DME instruction;Therapeutic exercise;Balance training;Functional mobility training;Therapeutic activities;Patient/family education    PT Goals (Current goals can be found in the Care Plan section)  Acute Rehab PT Goals PT Goal Formulation: With patient Time For Goal Achievement: 08/20/22 Potential to Achieve Goals: Good    Frequency Min 3X/week     Co-evaluation               AM-PAC PT "6 Clicks" Mobility  Outcome Measure Help needed turning from your back to your side while in a flat bed without using bedrails?: A Little Help needed moving from lying on your back to sitting on the side of a flat bed without using bedrails?: A Little Help needed moving to and from a bed to a chair (including a wheelchair)?: A Little Help needed standing up from a chair using your arms (e.g., wheelchair or bedside chair)?: A Little Help needed to walk in hospital room?: A Little Help needed climbing 3-5 steps with a railing? : A Little 6 Click Score: 18    End of Session Equipment Utilized During Treatment: Gait  belt;Oxygen Activity Tolerance: Patient tolerated treatment well Patient left: in chair;with call bell/phone within reach;with chair alarm set Nurse Communication: Mobility status (aware of SPO2) PT Visit Diagnosis: Difficulty in walking, not elsewhere classified (R26.2)    Time: [id number] PT Time Calculation (min) (ACUTE ONLY): 23 min   Charges:   PT Evaluation $PT Eval Low Complexity: 1 Low PT Treatments $Gait Training: 8-22 mins       Arlyce Dice, DPT Physical Therapist Acute Rehabilitation Services Office: [place] 08/06/2022, 2:22 PM

## 2022-08-06 NOTE — Consult Note (Signed)
NAME:  Frank [name], MRN:  [id number], DOB:  1956/03/11, LOS: 1 ADMISSION DATE:  08/04/2022, CONSULTATION DATE: 08/06/2022 REFERRING MD: Dr. 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  I follow him in the outpatient setting.  FEV1 in 2014 was 55% predicted.  He his history of prostate cancer with BPH, renal calculi.  He was admitted 3/29 with obstructive nephropathy and acute renal failure due to bladder outlet obstruction.  Decompression with a Foley catheter was performed with improvement in bilateral hydronephrosis, acute renal failure.  He was also dyspneic, noted to be wheezing and has been treated for an acute exacerbation of COPD.  Chest x-ray 3/30 with hyperinflation, emphysema but no acute infiltrates.  At home he is on Trelegy, maintenance prednisone 10 mg daily.  He is being treated with scheduled DuoNeb, Pulmicort nebs, Flonase, Solu-Medrol 40 mg every 12 hours.  He remains on 6 L/min.  PCCM consulted to assist with his care.  Pertinent  Medical History   Past Medical History:  Diagnosis Date   Arthritis    Cancer ([place])    COPD (chronic obstructive pulmonary disease) ([place]) 06/05/2012   Dyspnea    Dysrhythmia    Emphysema 06/07/2012   Heart murmur    hx of as baby   History of kidney stones    Hypoxemia 06/07/2012   Obstructive chronic bronchitis with exacerbation ([place]) 06/07/2012   Pneumonia    Tobacco abuse 06/05/2012   Significant Hospital Events: Including procedures, antibiotic start and stop dates in addition to other pertinent events   PFT 2014 FEV1 was 1.69 L (55% predicted) consistent with severe obstruction 3/29 Foley catheter placed Solu-Medrol, nebs for acute exacerbation COPD  Interim History / Subjective:  Patient reports   Objective   Blood pressure 124/68, pulse 77,  temperature 97.8 F (36.6 C), temperature source Oral, resp. rate 18, SpO2 91 %.        Intake/Output Summary (Last 24 hours) at 08/06/2022 1537 Last data filed at 08/06/2022 1025 Gross per 24 hour  Intake --  Output 2900 ml  Net -2900 ml   There were no vitals filed for this visit.  Examination: General: Chronically ill-appearing man, no distress. HENT: Poor dentition, oropharynx clear.  Nasal congestion and obstruction Lungs: Rhonchi bilaterally, expiratory wheezes superiorly Cardiovascular: Regular, distant, no murmur Abdomen: Benign, soft, nondistended with positive bowel sounds Extremities: No edema Neuro: Awake, alert, interacting appropriately, follows commands GU: Foley catheter  Resolved Hospital Problem list   Hydronephrosis and obstructive nephropathy Acute renal failure  Assessment & Plan:   Severe COPD with apparent acute exacerbation with associated acute on chronic hypoxemic respiratory failure.  He has been wearing 4 L/min at home, apparently saturates adequately at rest but sees desaturations with any exertion including just standing to walk.  Suspect that he has needed his O2 up titration prior to this admission, certainly with exertion.  Currently on 6 L/min. -Can probably changes Solu-Medrol to prednisone 40 mg daily and plan to taper back down to his usual 10 mg daily over about 2 weeks -Add azithromycin for 5 days to maximize treatment for AE-COPD -Treat nasal obstruction as below, may be contributing to his higher oxygen needs via nasal cannula -Scheduled DuoNeb, Pulmicort nebs for now.  Change back to his usual Trelegy when it is time for discharge -He will need  a repeat walking oxygen titration before discharge to determine appropriate exertional flow rate -Suspect his hypoxemia sub-acute.  Low suspicion for pulmonary embolism given his presentation although he is at risk for this due to malignancy.  His creatinine is normalized so he could tolerate CT-PA if  suspicion for PE increases  Chronic allergic rhinitis, nasal obstruction -Add loratadine -Increase fluticasone to 2 puffs twice daily at least until his acute nasal obstruction improves -Question whether he may need ENT evaluation for nasal obstruction if this turns out to be more longstanding problem  Prostate cancer, BPH in obstructive nephropathy -Foley in place.  Plans as per [facility] and Urology.    Labs   CBC: Recent Labs  Lab 08/04/22 1353 08/04/22 1911 08/05/22 0539 08/06/22 0622  WBC 17.1* 13.5* 10.7* 11.8*  NEUTROABS 14.6* 13.1*  --  10.3*  HGB 13.5 12.9* 10.8* 10.9*  HCT 42.3 40.6 35.3* 35.1*  MCV 89.2 91.2 94.1 94.1  PLT 194 187 162 [id number]    Basic Metabolic Panel: Recent Labs  Lab 08/04/22 1353 08/04/22 1911 08/05/22 0539 08/06/22 0622  NA 132* 137 140 140  K 4.9 4.3 4.7 4.2  CL 85* 96* 100 96*  CO2 33* 31 30 35*  GLUCOSE 111* 121* 92 124*  BUN 61* 51* 40* 32*  CREATININE 4.20* 2.54* 1.23 0.78  CALCIUM 9.6 8.9 8.4* 8.8*  MG  --  2.9*  --  2.1  PHOS  --  5.2*  --  2.8   GFR: CrCl cannot be calculated (Unknown ideal weight.). Recent Labs  Lab 08/04/22 1353 08/04/22 1911 08/05/22 0539 08/06/22 0622  WBC 17.1* 13.5* 10.7* 11.8*    Liver Function Tests: Recent Labs  Lab 08/04/22 1911 08/06/22 0622  AST 27  --   ALT 15  --   ALKPHOS 61  --   BILITOT 0.7  --   PROT 6.9  --   ALBUMIN 3.5 3.0*   No results for input(s): "LIPASE", "AMYLASE" in the last 168 hours. No results for input(s): "AMMONIA" in the last 168 hours.  ABG    Component Value Date/Time   PHART 7.36 08/05/2022 1454   PCO2ART 70 (HH) 08/05/2022 1454   PO2ART 76 (L) 08/05/2022 1454   HCO3 39.5 (H) 08/05/2022 1454   TCO2 37 (H) 02/28/2020 1233   O2SAT 97 08/05/2022 1454     Coagulation Profile: No results for input(s): "INR", "PROTIME" in the last 168 hours.  Cardiac Enzymes: No results for input(s): "CKTOTAL", "CKMB", "CKMBINDEX", "TROPONINI" in the last 168  hours.  HbA1C: Hemoglobin A1C  Date/Time Value Ref Range Status  06/30/2021 02:05 PM 5.6 4.0 - 5.6 % Final  06/12/2014 02:15 PM 5.7  Final    CBG: No results for input(s): "GLUCAP" in the last 168 hours.  Review of Systems:   As per HPI  Past Medical History:  He,  has a past medical history of Arthritis, Cancer ([place]), COPD (chronic obstructive pulmonary disease) ([place]) (06/05/2012), Dyspnea, Dysrhythmia, Emphysema (06/07/2012), Heart murmur, History of kidney stones, Hypoxemia (06/07/2012), Obstructive chronic bronchitis with exacerbation ([place]) (06/07/2012), Pneumonia, and Tobacco abuse (06/05/2012).   Surgical History:   Past Surgical History:  Procedure Laterality Date   CATARACT EXTRACTION, BILATERAL     CYSTOSCOPY WITH LITHOLAPAXY N/A 01/13/2022   Procedure: CYSTOSCOPY WITH LITHOLAPAXY;  Surgeon: Alexis Frock, MD;  Location: [facility];  Service: Urology;  Laterality: N/A;   CYSTOSCOPY/RETROGRADE/URETEROSCOPY/STONE EXTRACTION WITH BASKET Right 10/04/2016   Procedure: CYSTOSCOPY/RETROGRADE/URETEROSCOPY/STONE EXTRACTION WITH BASKET/ STENT;  Surgeon: Alexis Frock,  MD;  Location: [facility];  Service: Urology;  Laterality: Right;  With LASER   CYSTOSCOPY/URETEROSCOPY/HOLMIUM LASER/STENT PLACEMENT Right 01/13/2022   Procedure: CYSTOSCOPY/URETEROSCOPY/HOLMIUM LASER/STENT PLACEMENT;  Surgeon: Alexis Frock, MD;  Location: [facility];  Service: Urology;  Laterality: Right;   EXTRACORPOREAL SHOCK WAVE LITHOTRIPSY Right 06/29/2016   Procedure: RIGHT EXTRACORPOREAL SHOCK WAVE LITHOTRIPSY (ESWL);  Surgeon: Ardis Hughs, MD;  Location: [facility];  Service: Urology;  Laterality: Right;     Social History:   reports that he quit smoking about 7 years ago. His smoking use included cigarettes. He has a 17.50 pack-year smoking history. He quit smokeless tobacco use about 54 years ago.  His smokeless tobacco use included chew. He reports that he does not drink alcohol and does not use drugs.   Family  History:  His family history includes COPD in his mother; Colon polyps in his father and sister; Heart attack (age of onset: 41) in his father.   Allergies No Known Allergies   Home Medications  Prior to Admission medications   Medication Sig Start Date End Date Taking? Authorizing Provider  albuterol (ACCUNEB) 0.63 MG/3ML nebulizer solution USE 1 VIAL IN NEBULIZER EVERY 6 HOURS Patient taking differently: Take 1 ampule by nebulization every 6 (six) hours as needed for shortness of breath. USE 1 VIAL IN NEBULIZER EVERY 6 HOURS 03/07/22  Yes Arley Salamone, Rose Fillers, MD  albuterol (VENTOLIN HFA) 108 (90 Base) MCG/ACT inhaler INHALE 2 PUFFS BY MOUTH EVERY 4 HOURS AS NEEDED FOR WHEEZING OR SHORTNESS OF BREATH Patient taking differently: Inhale 1 puff into the lungs every 6 (six) hours as needed for shortness of breath. 07/03/22  Yes Margaretha Seeds, MD  calcium-vitamin D (OSCAL WITH D) 500-5 MG-MCG tablet Take 1 tablet by mouth daily with breakfast.   Yes [provider]  fluticasone (FLONASE) 50 MCG/ACT nasal spray Place 1 spray into both nostrils daily. 08/10/20  Yes Collene Gobble, MD  Fluticasone-Umeclidin-Vilant (TRELEGY ELLIPTA) 100-62.5-25 MCG/ACT AEPB INHALE 1 PUFF INTO THE LUNGS DAILY Patient taking differently: Take 1 puff by mouth daily. 07/03/22  Yes Margaretha Seeds, MD  guaiFENesin (MUCINEX) 600 MG 12 hr tablet Take 600 mg by mouth 2 (two) times daily as needed for to loosen phlegm or cough.   Yes [provider]  OXYGEN Inhale 4 L/min into the lungs continuous.    Yes [provider]  predniSONE (DELTASONE) 10 MG tablet TAKE 1 TABLET(10 MG) BY MOUTH DAILY WITH BREAKFAST Patient taking differently: Take 10 mg by mouth daily with breakfast. 07/03/22  Yes Margaretha Seeds, MD  tamsulosin (FLOMAX) 0.4 MG CAPS capsule TAKE 1 CAPSULE(0.4 MG) BY MOUTH DAILY Patient taking differently: Take 0.4 mg by mouth daily. 02/17/22  Yes Delene Ruffini, MD   oxyCODONE-acetaminophen (PERCOCET) 5-325 MG tablet Take 1 tablet by mouth every 6 (six) hours as needed for severe pain or moderate pain (post-operatively). Patient not taking: Reported on 08/05/2022 01/13/22 01/13/23  Alexis Frock, MD     Critical care time: NA     [name], MD, PhD 08/06/2022, 4:49 PM [facility] [phone] or if no answer before 7:00PM call [phone] For any issues after 7:00PM please call eLink [phone]

## 2022-08-06 NOTE — Progress Notes (Signed)
PROGRESS NOTE    FAYNE AHN  [id number] DOB: [birth year] DOA: 08/04/2022 PCP: Serita Butcher, MD  Outpatient Specialists:     Brief Narrative:  Patient is a 67 year old male with past medical history significant for chronic respiratory failure on 4 L of supplemental oxygen at home, COPD and prostate cancer.  Patient was admitted with COPD exacerbation, urinary retention and acute kidney injury.  Apparently, patient was unable to pass urine for 3 days.  Worsening shortness of breath and wheezing were also reported.  Foley catheter was placed in the ER.  Serum creatinine has returned to normal range.  Respiratory symptoms are slowly improving, however, patient continues to report shortness of breath with significant supplemental oxygen (6 L/min).  IV fluids have been discontinued.  For gentle diuresis.  Chest x-ray is noted.  08/05/2022: Patient seen alongside patient's sister.  Above documentation is noted.  Patient is slowly improving.  Patient actually looks much better today.  08/06/2022: Patient seen.  Patient has been requiring high amount of supplemental oxygen at rest and ambulation.  Patient is requiring 6 L of supplemental oxygen at rest and 8 L to 10 L of supplemental oxygen with walking.  Pulmonary team has been consulted.  Patient is well-known to Dr. [name].  At home, patient was on 4 L of supplemental oxygen.  Is possible that patient may have been on lower oxygen than actually needed while at home.  Pulmonary team will see patient.  Continue steroids and nebulizer treatment.   Assessment & Plan:   Principal Problem:   COPD exacerbation ([place]) Active Problems:   Acute on chronic respiratory failure ([facility])   Pressure injury of skin  Acute on chronic respiratory failure/COPD with exacerbation: -Admit patient for further assessment and management. -Continue supplemental oxygen. -Start patient on IV Solu-Medrol 40 Mg every 12, nebs DuoNeb every 6 hours and as needed  albuterol nebulizer every 2 as needed. -ABG. -Manage patient expectantly. 08/05/2022: Patient lives better today.  Patient is on 6 L of supplemental oxygen.  Repeat chest x-ray.  Check cardiac BNP.  Gentle diuresis.  Continue treatment for COPD exacerbation. 08/06/2022: Await pulmonary input.  Continue current regimen.   Acute kidney injury: -Likely secondary to bladder outlet obstruction. -Patient has history of renal cancer. -Patient is known to the local urologist, Dr. Tammi Klippel. -12 consulted to monitor. -Foley tube insertion. -Adequate hydration. -Renal ultrasound. -Continue to monitor renal function and electrolytes. -Repeat renal panel. -Avoid nephrotoxins. -Keep MAP greater than 75%. -Further management depend on hospital course. 08/05/2022: Postrenal AKI.  Resolved significantly.  Urology has been consulted.  Leave Foley in place for the next 5 to 10 days as per urology team.  Trial of voiding will be done at the urology office. 08/06/2022: Resolved.  Patient has Foley in place.   Prostate cancer: -Will defer to the urologist and oncology team.   History of nephrolithiasis: -Adequate hydration.   DVT prophylaxis: Subcutaneous heparin. Code Status: Full code. Family Communication: Sister Disposition Plan: Home eventually   Consultants:  Urology.  Procedures:  Indwelling Foley catheterization  Antimicrobials:  None   Subjective: Shortness of breath has improved significantly.    Objective: Vitals:   08/06/22 0320 08/06/22 0615 08/06/22 1202 08/06/22 1406  BP:   124/68   Pulse:   77   Resp:   18   Temp:   97.8 F (36.6 C)   TempSrc:   Oral   SpO2: 95% 96% 96% 91%    Intake/Output Summary (Last 24 hours)  at 08/06/2022 1512 Last data filed at 08/06/2022 1025 Gross per 24 hour  Intake --  Output 2900 ml  Net -2900 ml    There were no vitals filed for this visit.  Examination:  General exam: Appears calm and comfortable  Respiratory system: Decreased  air entry with expiratory wheeze. Cardiovascular system: S1 & S2 heard Gastrointestinal system: Abdomen is soft and nontender.   Central nervous system: Awake and alert.  More communicative today.  Patient moves all extremities.   Extremities: No leg edema.  Data Reviewed: I have personally reviewed following labs and imaging studies  CBC: Recent Labs  Lab 08/04/22 1353 08/04/22 1911 08/05/22 0539 08/06/22 0622  WBC 17.1* 13.5* 10.7* 11.8*  NEUTROABS 14.6* 13.1*  --  10.3*  HGB 13.5 12.9* 10.8* 10.9*  HCT 42.3 40.6 35.3* 35.1*  MCV 89.2 91.2 94.1 94.1  PLT 194 187 162 [id number]    Basic Metabolic Panel: Recent Labs  Lab 08/04/22 1353 08/04/22 1911 08/05/22 0539 08/06/22 0622  NA 132* 137 140 140  K 4.9 4.3 4.7 4.2  CL 85* 96* 100 96*  CO2 33* 31 30 35*  GLUCOSE 111* 121* 92 124*  BUN 61* 51* 40* 32*  CREATININE 4.20* 2.54* 1.23 0.78  CALCIUM 9.6 8.9 8.4* 8.8*  MG  --  2.9*  --  2.1  PHOS  --  5.2*  --  2.8    GFR: CrCl cannot be calculated (Unknown ideal weight.). Liver Function Tests: Recent Labs  Lab 08/04/22 1911 08/06/22 0622  AST 27  --   ALT 15  --   ALKPHOS 61  --   BILITOT 0.7  --   PROT 6.9  --   ALBUMIN 3.5 3.0*    No results for input(s): "LIPASE", "AMYLASE" in the last 168 hours. No results for input(s): "AMMONIA" in the last 168 hours. Coagulation Profile: No results for input(s): "INR", "PROTIME" in the last 168 hours. Cardiac Enzymes: No results for input(s): "CKTOTAL", "CKMB", "CKMBINDEX", "TROPONINI" in the last 168 hours. BNP (last 3 results) No results for input(s): "PROBNP" in the last 8760 hours. HbA1C: No results for input(s): "HGBA1C" in the last 72 hours. CBG: No results for input(s): "GLUCAP" in the last 168 hours. Lipid Profile: No results for input(s): "CHOL", "HDL", "LDLCALC", "TRIG", "CHOLHDL", "LDLDIRECT" in the last 72 hours. Thyroid Function Tests: No results for input(s): "TSH", "T4TOTAL", "FREET4", "T3FREE",  "THYROIDAB" in the last 72 hours. Anemia Panel: No results for input(s): "VITAMINB12", "FOLATE", "FERRITIN", "TIBC", "IRON", "RETICCTPCT" in the last 72 hours. Urine analysis:    Component Value Date/Time   COLORURINE YELLOW 08/04/2022 1353   APPEARANCEUR CLEAR 08/04/2022 1353   LABSPEC 1.013 08/04/2022 1353   PHURINE 7.0 08/04/2022 1353   GLUCOSEU NEGATIVE 08/04/2022 1353   HGBUR NEGATIVE 08/04/2022 1353   BILIRUBINUR NEGATIVE 08/04/2022 1353   KETONESUR 5 (A) 08/04/2022 1353   PROTEINUR NEGATIVE 08/04/2022 1353   UROBILINOGEN 0.2 06/12/2014 1400   NITRITE NEGATIVE 08/04/2022 1353   LEUKOCYTESUR NEGATIVE 08/04/2022 1353   Sepsis Labs: @LABRCNTIP (procalcitonin:4,lacticidven:4)  ) Recent Results (from the past 240 hour(s))  Respiratory (~20 pathogens) panel by PCR     Status: None   Collection Time: 08/04/22  6:56 PM   Specimen: Nasopharyngeal Swab; Respiratory  Result Value Ref Range Status   Adenovirus NOT DETECTED NOT DETECTED Final   Coronavirus 229E NOT DETECTED NOT DETECTED Final    Comment: (NOTE) The Coronavirus on the Respiratory Panel, DOES NOT test for the novel  Coronavirus (  2019 nCoV)    Coronavirus HKU1 NOT DETECTED NOT DETECTED Final   Coronavirus NL63 NOT DETECTED NOT DETECTED Final   Coronavirus OC43 NOT DETECTED NOT DETECTED Final   Metapneumovirus NOT DETECTED NOT DETECTED Final   Rhinovirus / Enterovirus NOT DETECTED NOT DETECTED Final   Influenza A NOT DETECTED NOT DETECTED Final   Influenza B NOT DETECTED NOT DETECTED Final   Parainfluenza Virus 1 NOT DETECTED NOT DETECTED Final   Parainfluenza Virus 2 NOT DETECTED NOT DETECTED Final   Parainfluenza Virus 3 NOT DETECTED NOT DETECTED Final   Parainfluenza Virus 4 NOT DETECTED NOT DETECTED Final   Respiratory Syncytial Virus NOT DETECTED NOT DETECTED Final   Bordetella pertussis NOT DETECTED NOT DETECTED Final   Bordetella Parapertussis NOT DETECTED NOT DETECTED Final   Chlamydophila pneumoniae NOT  DETECTED NOT DETECTED Final   Mycoplasma pneumoniae NOT DETECTED NOT DETECTED Final    Comment: Performed at [facility], [place] [address], [place], [place] [address]  Resp panel by RT-PCR (RSV, Flu A&B, Covid) Anterior Nasal Swab     Status: None   Collection Time: 08/04/22  6:56 PM   Specimen: Anterior Nasal Swab  Result Value Ref Range Status   SARS Coronavirus 2 by RT PCR NEGATIVE NEGATIVE Final    Comment: (NOTE) SARS-CoV-2 target nucleic acids are NOT DETECTED.  The SARS-CoV-2 RNA is generally detectable in upper respiratory specimens during the acute phase of infection. The lowest concentration of SARS-CoV-2 viral copies this assay can detect is 138 copies/mL. A negative result does not preclude SARS-Cov-2 infection and should not be used as the sole basis for treatment or other patient management decisions. A negative result may occur with  improper specimen collection/handling, submission of specimen other than nasopharyngeal swab, presence of viral mutation(s) within the areas targeted by this assay, and inadequate number of viral copies(<138 copies/mL). A negative result must be combined with clinical observations, patient history, and epidemiological information. The expected result is Negative.  Fact Sheet for Patients:  EntrepreneurPulse.com.au  Fact Sheet for Healthcare Providers:  IncredibleEmployment.be  This test is no t yet approved or cleared by the [place] FDA and  has been authorized for detection and/or diagnosis of SARS-CoV-2 by FDA under an Emergency Use Authorization (EUA). This EUA will remain  in effect (meaning this test can be used) for the duration of the COVID-19 declaration under Section 564(b)(1) of the Act, 21 U.S.C.section 360bbb-3(b)(1), unless the authorization is terminated  or revoked sooner.       Influenza A by PCR NEGATIVE NEGATIVE Final   Influenza B by PCR NEGATIVE NEGATIVE Final     Comment: (NOTE) The Xpert Xpress SARS-CoV-2/FLU/RSV plus assay is intended as an aid in the diagnosis of influenza from Nasopharyngeal swab specimens and should not be used as a sole basis for treatment. Nasal washings and aspirates are unacceptable for Xpert Xpress SARS-CoV-2/FLU/RSV testing.  Fact Sheet for Patients: EntrepreneurPulse.com.au  Fact Sheet for Healthcare Providers: IncredibleEmployment.be  This test is not yet approved or cleared by the [place] FDA and has been authorized for detection and/or diagnosis of SARS-CoV-2 by FDA under an Emergency Use Authorization (EUA). This EUA will remain in effect (meaning this test can be used) for the duration of the COVID-19 declaration under Section 564(b)(1) of the Act, 21 U.S.C. section 360bbb-3(b)(1), unless the authorization is terminated or revoked.     Resp Syncytial Virus by PCR NEGATIVE NEGATIVE Final    Comment: (NOTE) Fact Sheet for Patients: EntrepreneurPulse.com.au  Fact Sheet for Healthcare Providers: IncredibleEmployment.be  This test is not yet approved or cleared by the [place] FDA and has been authorized for detection and/or diagnosis of SARS-CoV-2 by FDA under an Emergency Use Authorization (EUA). This EUA will remain in effect (meaning this test can be used) for the duration of the COVID-19 declaration under Section 564(b)(1) of the Act, 21 U.S.C. section 360bbb-3(b)(1), unless the authorization is terminated or revoked.  Performed at [facility], [place] [address], [place], [place] [address]          Radiology Studies: DG CHEST PORT 1 VIEW  Result Date: 08/05/2022 CLINICAL DATA:  Shortness of breath. EXAM: PORTABLE CHEST 1 VIEW COMPARISON:  Radiograph yesterday. FINDINGS: Chronic hyperinflation with apical predominant emphysema. Bronchovascular crowding at the lung bases likely due to emphysematous  change. The heart is normal in size. Stable mediastinal contours. Aortic atherosclerosis. No pulmonary edema, pleural effusion or pneumothorax. Stable osseous structures. IMPRESSION: Emphysema without acute abnormality. Electronically Signed   By: Keith Rake M.D.   On: 08/05/2022 15:15   US RENAL  Result Date: 08/04/2022 CLINICAL DATA:  Acute kidney insufficiency EXAM: RENAL / URINARY TRACT ULTRASOUND COMPLETE COMPARISON:  CT 08/04/2022 earlier FINDINGS: Right Kidney: Renal measurements: 11.5 x 5.3 x 5.4 = volume: 172.8 mL. No collecting system dilatation perinephric fluid. There is a parapelvic right-sided renal cyst as seen on CT. This measures up to 2.7 cm by ultrasound. Extrarenal pelvis noted on the right which is more distended than on the prior CT scan. Left Kidney: Renal measurements: 11.9 x 5.5 x 6.1 = volume: 209.4 mL. Extrarenal pelvis of the left kidney is also noted and similar to previous when adjusting for technique. No collecting system dilatation or perinephric fluid. Bladder: Bladder is contracted with a Foley catheter. Other: Study somewhat limited by breathing motion as per the sonographer in overlapping soft tissue IMPRESSION: Extrarenal pelvis noted to each kidney. No separate collecting system dilatation. Parapelvic right-sided renal cyst as seen on CT. Electronically Signed   By: Jill Side M.D.   On: 08/04/2022 17:54   CT Renal Stone Study  Result Date: 08/04/2022 CLINICAL DATA:  Flank pain. Difficulty urinating. 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 RADIATION DOSE REDUCTION: This exam was performed according to the departmental dose-optimization program which includes automated exposure control, adjustment of the mA and/or kV according to patient size and/or use of iterative  reconstruction technique. COMPARISON:  Whole-body bone scan 12/07/2021.  CT scan 12/06/2021 FINDINGS: Lower chest: Breathing motion identified. Advanced centrilobular emphysematous changes are seen along the lung bases. No pleural effusion. There is some subtle opacity dependently along the right lower lobe. Atelectasis is favored. Small hiatal hernia. Hepatobiliary: No focal liver abnormality is seen. No gallstones, gallbladder wall thickening, or biliary dilatation. Pancreas: Unremarkable. No pancreatic ductal dilatation or surrounding inflammatory changes. Spleen: Normal in size without focal abnormality.  Small splenule Adrenals/Urinary Tract: The adrenal glands are preserved. Perinephric stranding identified bilaterally, right-greater-than-left. Extrarenal pelvis noted bilaterally with slight ectasia of the right renal collecting system greater than left but both ureters are ectatic down to the level of the bladder. The bladder is contracted with a Foley catheter. There are some calcifications seen within the bladder but near the margin of the right UVJ. Component of UVJ calcification on the right is possible. No ureteral  stones otherwise identified. Punctate lower pole right-sided renal stone. Previously there were bladder stones. There is a calcification seen measuring 6 mm on series 2, image 78 just to the right of the course of the prosthetic urethra. This could be calcification with in the prostate. Stomach/Bowel: Normal retrocecal appendix. Moderate colonic stool. The bowel is nondilated. This includes stomach, small bowel and large bowel. Vascular/Lymphatic: Extensive vascular calcifications along the aorta and branch vessels. There is ectatic appearance to the celiac with diameter approaching 14 mm. Preserved IVC. No specific abnormal lymph node enlargement seen in the abdomen and pelvis. Reproductive: Enlarged appearance of the left seminal vesicle but unchanged from prior. There is also ill-defined  margin along the prostate. Please correlate with the history of prostate neoplasm Other: Scattered retroperitoneal stranding extending down from the kidneys to the pelvis. This has increased from the prior CT scan. Musculoskeletal: Curvature of the spine. Scattered degenerative changes of the spine and pelvis. There are bridging osteophytes along the sacroiliac joints. Trace anterolisthesis of L5 on S1 with pars defects. There are scattered areas of bony sclerosis seen throughout the skeleton consistent with known osseous metastatic disease. Compared to the prior CT scan these are increased in extent and distribution. Follow up bone scan as clinically appropriate IMPRESSION: Interval development of mild bilateral renal collecting system dilatation, right-greater-than-left down to the bladder. There are some calcifications along the bladder on the right side near the margin of the right UVJ. Otherwise no ureteral stones. Punctate nonobstructing right-sided renal stone. Etiology of the dilatation is uncertain overall. Postcontrast study could be considered with delayed imaging as clinically appropriate. The bladder is contracted with a Foley catheter. Increasing retroperitoneal stranding diffusely. Heterogeneous prostate with enlarged appearance of the left seminal vesicle. Please correlate for known history of prostate neoplasm. In addition there is increasing extent and distribution of sclerotic bone metastases. A follow up bone scan could be considered when clinically appropriate. No bowel obstruction, free air. Normal appendix. Small hiatal hernia. Diffuse atherosclerotic changes. 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 Electronically Signed   By: Jill Side M.D.   On: 08/04/2022 17:14        Scheduled Meds:  budesonide (PULMICORT) nebulizer solution  0.25 mg Nebulization BID   Chlorhexidine Gluconate Cloth  6 each Topical Daily   fluticasone  1 spray Each Nare Daily   heparin injection  (subcutaneous)  5,000 Units Subcutaneous Q8H   ipratropium-albuterol  3 mL Nebulization Q6H   methylPREDNISolone (SOLU-MEDROL) injection  40 mg Intravenous Q12H   tamsulosin  0.4 mg Oral Daily   Continuous Infusions:   LOS: 1 day    Time spent: 35 minutes.    Dana Allan, MD  [facility] Pager #: [phone] 7PM-7AM contact night coverage as above

## 2022-08-06 NOTE — Evaluation (Signed)
Occupational Therapy Evaluation Patient Details Name: Frank [name] MRN: [id number] DOB: Dec 17, 1955 Today's Date: 08/06/2022   History of Present Illness Patient is a 67 year old male who presented with SOB,and inability to pass urine.  patient was admitted with COPD exacerbation, acute on chronic respiratory failure, AKI, and urinary retention. PMH: prostate cancer, BPH, UTI symptoms, cancer, nephrolithiasis.   Clinical Impression   Patient is a 67 year old male who was admitted for above. Patient reported living at home alone and closest relatives live in [place].  Patient was noted to be significantly limited with SOB on 6L/min during session with patient able to maintain 90-92% on 6L/min with education on breathing strategies. Patient was min guard for transfer but unable to tolerate standing at sink to brush teeth like his typical routine. Patient was noted to have decreased functional activity tolerance, decreased endurance, decreased standing balance, decreased safety awareness, and decreased knowledge of AD/AE impacting participation in ADLs. Patient would continue to benefit from skilled OT services at this time while admitted and after d/c to address noted deficits in order to improve overall safety and independence in ADLs.      Recommendations for follow up therapy are one component of a multi-disciplinary discharge planning process, led by the attending physician.  Recommendations may be updated based on patient status, additional functional criteria and insurance authorization.   Assistance Recommended at Discharge Frequent or constant Supervision/Assistance  Patient can return home with the following A little help with walking and/or transfers;A lot of help with bathing/dressing/bathroom;Assistance with cooking/housework;Direct supervision/assist for medications management;Assist for transportation;Help with stairs or ramp for entrance;Direct supervision/assist for financial management     Functional Status Assessment  Patient has had a recent decline in their functional status and demonstrates the ability to make significant improvements in function in a reasonable and predictable amount of time.  Equipment Recommendations  None recommended by OT    Recommendations for Other Services       Precautions / Restrictions Precautions Precautions: Fall Precaution Comments: monitor O2, foley Restrictions Weight Bearing Restrictions: No      Mobility Bed Mobility Overal bed mobility: Modified Independent                  Transfers Overall transfer level: Needs assistance   Transfers: Bed to chair/wheelchair/BSC       Step pivot transfers: Min guard            Balance Overall balance assessment: Mild deficits observed, not formally tested           ADL either performed or assessed with clinical judgement   ADL Overall ADL's : Needs assistance/impaired Eating/Feeding: Modified independent;Sitting   Grooming: Wash/dry face;Oral care;Set up;Sitting Grooming Details (indicate cue type and reason): with increasd SOB with minimal movement. patient reported he normally stands to complete this task. on 6L/min 90% during task Upper Body Bathing: Sitting;Min guard   Lower Body Bathing: Maximal assistance;Sitting/lateral leans;Sit to/from stand   Upper Body Dressing : Min guard;Sitting   Lower Body Dressing: Maximal assistance;Sitting/lateral leans;Sit to/from stand   Toilet Transfer: Magazine features editor Details (indicate cue type and reason): to transfer to recliner in room with education on O2 cord management. patient reported he had a 8-10 foot cord at home. patient was educated on checking O2 cord each night. patient verbalized he checked for knots but not splits in the line, stated that he already changes his O2 cords every two months. Toileting- Clothing Manipulation and Hygiene: Minimal assistance;Sit  to/from stand                Vision Baseline Vision/History: 1 Wears glasses Vision Assessment?: No apparent visual deficits            Pertinent Vitals/Pain Pain Assessment Pain Assessment: No/denies pain     Hand Dominance Right   Extremity/Trunk Assessment Upper Extremity Assessment Upper Extremity Assessment: Overall WFL for tasks assessed   Lower Extremity Assessment Lower Extremity Assessment: Defer to PT evaluation   Cervical / Trunk Assessment Cervical / Trunk Assessment: Kyphotic   Communication Communication Communication: Other (comment) (dyspnea limits talking)   Cognition Arousal/Alertness: Awake/alert Behavior During Therapy: WFL for tasks assessed/performed Overall Cognitive Status: Within Functional Limits for tasks assessed           General Comments: dyspnea limitng communication. patient reported day was [employer] but did not know the date without looking at board in room to read it to therapist. plesant and cooperative during session.                Home Living Family/patient expects to be discharged to:: Private residence Living Arrangements: Alone Available Help at Discharge: Family;Available PRN/intermittently   Home Access: Level entry     Home Layout: Two level;Able to live on main level with bedroom/bathroom     Bathroom Shower/Tub: Tub/shower unit             Additional Comments: uses 4L/min O2 at home      Prior Functioning/Environment Prior Level of Function : Independent/Modified Independent                        OT Problem List: Decreased activity tolerance;Cardiopulmonary status limiting activity;Decreased safety awareness;Impaired balance (sitting and/or standing);Decreased knowledge of precautions      OT Treatment/Interventions: Self-care/ADL training;Energy conservation;Therapeutic exercise;DME and/or AE instruction;Therapeutic activities;Patient/family education;Balance training    OT Goals(Current goals can be found  in the care plan section) Acute Rehab OT Goals Patient Stated Goal: to get back home OT Goal Formulation: With patient Time For Goal Achievement: 08/20/22 Potential to Achieve Goals: Fair  OT Frequency: Min 2X/week       AM-PAC OT "6 Clicks" Daily Activity     Outcome Measure Help from another person eating meals?: A Little Help from another person taking care of personal grooming?: A Little Help from another person toileting, which includes using toliet, bedpan, or urinal?: A Little Help from another person bathing (including washing, rinsing, drying)?: A Lot Help from another person to put on and taking off regular upper body clothing?: A Little Help from another person to put on and taking off regular lower body clothing?: A Lot 6 Click Score: 16   End of Session Nurse Communication: Other (comment) (ok to participate in session)  Activity Tolerance: Patient tolerated treatment well Patient left: in chair;with call bell/phone within reach;with chair alarm set  OT Visit Diagnosis: Unsteadiness on feet (R26.81);Other abnormalities of gait and mobility (R26.89);Muscle weakness (generalized) (M62.81)                Time: [id number] OT Time Calculation (min): 19 min Charges:  OT General Charges $OT Visit: 1 Visit OT Evaluation $OT Eval Moderate Complexity: 1 Mod  Arwin Bisceglia OTR/L, MS Acute Rehabilitation Department Office# [phone]   [name] 08/06/2022, 11:04 AM

## 2022-08-07 DIAGNOSIS — J441 Chronic obstructive pulmonary disease with (acute) exacerbation: Secondary | ICD-10-CM | POA: Diagnosis not present

## 2022-08-07 LAB — CBC
HCT: 39.1 % (ref 39.0–52.0)
Hemoglobin: 11.9 g/dL — ABNORMAL LOW (ref 13.0–17.0)
MCH: 28.7 pg (ref 26.0–34.0)
MCHC: 30.4 g/dL (ref 30.0–36.0)
MCV: 94.4 fL (ref 80.0–100.0)
Platelets: 169 10*3/uL (ref 150–400)
RBC: 4.14 MIL/uL — ABNORMAL LOW (ref 4.22–5.81)
RDW: 12.4 % (ref 11.5–15.5)
WBC: 9.7 10*3/uL (ref 4.0–10.5)
nRBC: 0 % (ref 0.0–0.2)

## 2022-08-07 NOTE — Progress Notes (Signed)
PHYSICAL THERAPY  SATURATION QUALIFICATIONS: (This note is used to comply with regulatory documentation for home oxygen)  Patient Saturations on Room Air at Rest =  unable to trial RA (Hx oxygen 4 lts prior to admit)  Patient Saturations on 6 lts at rest 93%  Patient Saturations on Room Air while Ambulating =  unable to trial RA  Patient Saturations on 6 Liters of oxygen while Ambulating 12 feet + 18 feet = 83%  Please briefly explain why patient needs home oxygen:  Pt requires increased supplemental oxygen of 6 lts to maintain therapeutic levels.    Rica Koyanagi  PTA Acute  Rehabilitation Services Office M-F          [phone]

## 2022-08-07 NOTE — NC FL2 (Signed)
[place] MEDICAID FL2 LEVEL OF CARE FORM     IDENTIFICATION  Patient Name: Frank [name] Birthdate: [birth year] Sex: male Admission Date (Current Location): 08/04/2022  [facility] and [place] Number:  Herbalist and Address:  [facility],  [address], [place]      Provider Number: [id number]  Attending Physician Name and Address:  [place] Eric J, DO  Relative Name and Phone Number:  Patric Dykes (sister) Ph: [phone]    Current Level of Care: Hospital Recommended Level of Care: [place] Prior Approval Number:    Date Approved/Denied:   PASRR Number: [id number]  Discharge Plan: SNF    Current Diagnoses: Patient Active Problem List   Diagnosis Date Noted   Acute on chronic respiratory failure 08/05/2022   Pressure injury of skin 08/05/2022   COPD exacerbation 08/04/2022   Insomnia 11/15/2020   COVID-19 02/28/2020   Physical deconditioning 08/29/2019   Decreased appetite 07/08/2019   Healthcare maintenance 07/08/2019   Allergic rhinitis 05/30/2017   COPD (chronic obstructive pulmonary disease) 11/27/2016   Advance care planning 10/23/2016   Diverticulosis of colon 06/09/2016   History of colonic polyps 06/17/2014   Prostate cancer metastatic to bone 06/12/2014   Chronic respiratory failure with hypoxia, on home O2 therapy 06/05/2012    Orientation RESPIRATION BLADDER Height & Weight     Self, Time, Situation, Place  O2 (6L/min) Incontinent Weight:   Height:     BEHAVIORAL SYMPTOMS/MOOD NEUROLOGICAL BOWEL NUTRITION STATUS   (N/A)  (N/A) Continent Diet (Regular diet)  AMBULATORY STATUS COMMUNICATION OF NEEDS Skin   Limited Assist Verbally Other (Comment) (Erythema: bilateral buttocks)                       Personal Care Assistance Level of Assistance  Bathing, Feeding, Dressing Bathing Assistance: Limited assistance Feeding assistance: Independent Dressing Assistance: Limited assistance      Functional Limitations Info  Sight, Hearing, Speech Sight Info: Impaired (Wears glasses) Hearing Info: Adequate Speech Info: Adequate    SPECIAL CARE FACTORS FREQUENCY  PT (By licensed PT), OT (By licensed OT)     PT Frequency: 5x's/week OT Frequency: 5x's/week            Contractures Contractures Info: Not present    Additional Factors Info  Code Status, Allergies Code Status Info: Full Allergies Info: NKA           Current Medications (08/07/2022):  This is the current hospital active medication list Current Facility-Administered Medications  Medication Dose Route Frequency Provider Last Rate Last Admin   azithromycin (ZITHROMAX) tablet 250 mg  250 mg Oral Daily Collene Gobble, MD   250 mg at 08/07/22 0926   budesonide (PULMICORT) nebulizer solution 0.25 mg  0.25 mg Nebulization BID Dana Allan I, MD   0.25 mg at 08/07/22 0753   Chlorhexidine Gluconate Cloth 2 % PADS 6 each  6 each Topical Daily Dana Allan I, MD   6 each at 08/07/22 0926   fluticasone (FLONASE) 50 MCG/ACT nasal spray 2 spray  2 spray Each Nare BID Collene Gobble, MD   2 spray at 08/07/22 0824   guaiFENesin (MUCINEX) 12 hr tablet 600 mg  600 mg Oral BID PRN Dana Allan I, MD   600 mg at 08/07/22 1337   heparin injection 5,000 Units  5,000 Units Subcutaneous Q8H Dana Allan I, MD   5,000 Units at 08/07/22 1335   ipratropium-albuterol (DUONEB) 0.5-2.5 (3) MG/3ML nebulizer  solution 3 mL  3 mL Nebulization Q2H PRN Dana Allan I, MD       ipratropium-albuterol (DUONEB) 0.5-2.5 (3) MG/3ML nebulizer solution 3 mL  3 mL Nebulization QID Collene Gobble, MD   3 mL at 08/07/22 1304   loratadine (CLARITIN) tablet 10 mg  10 mg Oral Daily Collene Gobble, MD   10 mg at 08/07/22 [id number]   methylPREDNISolone sodium succinate (SOLU-MEDROL) 40 mg/mL injection 40 mg  40 mg Intravenous Q12H Dana Allan I, MD   40 mg at 08/07/22 0457   oxyCODONE-acetaminophen (PERCOCET/ROXICET) 5-325 MG per  tablet 1 tablet  1 tablet Oral Q6H PRN Dana Allan I, MD       sodium chloride (OCEAN) 0.65 % nasal spray 1 spray  1 spray Each Nare PRN Collene Gobble, MD       tamsulosin (FLOMAX) capsule 0.4 mg  0.4 mg Oral Daily Dana Allan I, MD   0.4 mg at 08/07/22 [id number]     Discharge Medications: Please see discharge summary for a list of discharge medications.  Relevant Imaging Results:  Relevant Lab Results:   Additional Information SSN: [id number]  Sherie Don, LCSW

## 2022-08-07 NOTE — Progress Notes (Signed)
PHYSICAL THERAPY  SATURATION QUALIFICATIONS: (This note is used to comply with regulatory documentation for home oxygen)  Patient Saturations on Room Air at Rest =  unable to trial RA (Hx oxygen 4 lts prior to admit)  Patient Saturations on 6 lts at rest 93%  Patient Saturations on Room Air while Ambulating =  unable to trial RA  Patient Saturations on 6 Liters of oxygen while Ambulating 12 feet + 18 feet = 83%  Please briefly explain why patient needs home oxygen:  Pt requires increased supplemental oxygen of 6 lts to maintain therapeutic levels.    Frank [name]  PTA Acute  Rehabilitation Services Office M-F          [phone]

## 2022-08-07 NOTE — TOC Initial Note (Signed)
Transition of Care [facility]) - Initial/Assessment Note   Patient Details  Name: Frank [name] MRN: [id number] Date of Birth: [birth year]  Transition of Care [facility]) CM/SW Contact:    Sherie Don, LCSW Phone Number: 08/07/2022, 1:33 PM  Clinical Narrative: PT evaluation is now recommending SNF. Patient requested that CSW speak with sister, Patric Dykes. Sister is in agreement with SNF as patient lives alone and they mostly check in on each other, so he would have limited support at home.  FL2 done; PASRR received. Initial referral faxed out. TOC awaiting bed offers.  Expected Discharge Plan: Skilled Nursing Facility Barriers to Discharge: Continued Medical Work up  Patient Goals and CMS Choice Patient states their goals for this hospitalization and ongoing recovery are:: Go to short-term rehab CMS Medicare.gov Compare Post Acute Care list provided to:: Patient Choice offered to / list presented to : Patient, Sibling  Expected Discharge Plan and Services In-house Referral: Clinical Social Work Post Acute Care Choice: [place] Living arrangements for the past 2 months: Veteran           DME Arranged: N/A DME Agency: NA  Prior Living Arrangements/Services Living arrangements for the past 2 months: Single Family Home Lives with:: Self Patient language and need for interpreter reviewed:: Yes Do you feel safe going back to the place where you live?: Yes      Need for Family Participation in Patient Care: No (Comment) Care giver support system in place?: Yes (comment) Criminal Activity/Legal Involvement Pertinent to Current Situation/Hospitalization: No - Comment as needed  Permission Sought/Granted Permission sought to share information with : Facility Art therapist granted to share information with : Yes, Verbal Permission Granted Permission granted to share info w AGENCY: SNFs  Emotional Assessment Attitude/Demeanor/Rapport:  Engaged Affect (typically observed): Accepting, Appropriate Orientation: : Oriented to Self, Oriented to Place, Oriented to  Time, Oriented to Situation Alcohol / Substance Use: Not Applicable Psych Involvement: No (comment)  Admission diagnosis:  Urinary retention [R33.9] COPD exacerbation [J44.1] AKI (acute kidney injury) [N17.9] Acute on chronic respiratory failure [J96.20] Patient Active Problem List   Diagnosis Date Noted   Acute on chronic respiratory failure 08/05/2022   Pressure injury of skin 08/05/2022   COPD exacerbation 08/04/2022   Insomnia 11/15/2020   COVID-19 02/28/2020   Physical deconditioning 08/29/2019   Decreased appetite 07/08/2019   Healthcare maintenance 07/08/2019   Allergic rhinitis 05/30/2017   COPD (chronic obstructive pulmonary disease) 11/27/2016   Advance care planning 10/23/2016   Diverticulosis of colon 06/09/2016   History of colonic polyps 06/17/2014   Prostate cancer metastatic to bone 06/12/2014   Chronic respiratory failure with hypoxia, on home O2 therapy 06/05/2012   PCP:  Serita Butcher, MD Pharmacy:   [facility] [place] [place] [address] Phone: [phone] Fax: [phone]  [place] - [address] [place]) [place] [address] Phone: [phone] Fax: [phone]  Social Determinants of Health (SDOH) Social History: SDOH Screenings   Food Insecurity: No Food Insecurity (07/17/2022)  Housing: Low Risk  (07/17/2022)  Transportation Needs: Unmet Transportation Needs (07/17/2022)  Utilities: Not At Risk (07/17/2022)  Alcohol Screen: Low Risk  (07/17/2022)  Depression (PHQ2-9): Low Risk  (07/17/2022)  Financial Resource Strain: Low Risk  (07/17/2022)  Physical Activity: Inactive (07/17/2022)  Social Connections: Socially Isolated (07/17/2022)  Stress:  No  Stress Concern Present (07/17/2022)  Tobacco Use: Medium Risk (08/04/2022)   SDOH Interventions:    Readmission Risk Interventions     No data to display

## 2022-08-07 NOTE — Progress Notes (Signed)
NAME:  Frank [name], MRN:  [id number], DOB:  [birth year], LOS: 2 ADMISSION DATE:  08/04/2022, CONSULTATION DATE: 08/06/2022 REFERRING MD: Dr. 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  I follow him in the outpatient setting.  FEV1 in 2014 was 55% predicted.  He his history of prostate cancer with BPH, renal calculi.  He was admitted 3/29 with obstructive nephropathy and acute renal failure due to bladder outlet obstruction.  Decompression with a Foley catheter was performed with improvement in bilateral hydronephrosis, acute renal failure.  He was also dyspneic, noted to be wheezing and has been treated for an acute exacerbation of COPD.  Chest x-ray 3/30 with hyperinflation, emphysema but no acute infiltrates.  At home he is on Trelegy, maintenance prednisone 10 mg daily.  He is being treated with scheduled DuoNeb, Pulmicort nebs, Flonase, Solu-Medrol 40 mg every 12 hours.  He remains on 6 L/min.  PCCM consulted to assist with his care.  Pertinent  Medical History   Past Medical History:  Diagnosis Date   Arthritis    Cancer ([place])    COPD (chronic obstructive pulmonary disease) ([place]) 06/05/2012   Dyspnea    Dysrhythmia    Emphysema 06/07/2012   Heart murmur    hx of as baby   History of kidney stones    Hypoxemia 06/07/2012   Obstructive chronic bronchitis with exacerbation ([place]) 06/07/2012   Pneumonia    Tobacco abuse 06/05/2012   Significant Hospital Events: Including procedures, antibiotic start and stop dates in addition to other pertinent events   PFT 2014 FEV1 was 1.69 L (55% predicted) consistent with severe obstruction 3/29 Foley catheter placed Solu-Medrol, nebs for acute exacerbation COPD  Interim History / Subjective:   No issues. Walked this morning with desaturations.   Objective    Blood pressure (!) 144/81, pulse 93, temperature (!) 97.5 F (36.4 C), temperature source Oral, resp. rate 18, SpO2 96 %.        Intake/Output Summary (Last 24 hours) at 08/07/2022 1141 Last data filed at 08/07/2022 [id number] Gross per 24 hour  Intake 120 ml  Output 2100 ml  Net -1980 ml   There were no vitals filed for this visit.  Examination: General: Elderly gentleman, chronically ill-appearing no distress sitting up in chair HENT: NCAT, poor dentition Lungs: Expiratory wheezing faint, bilaterally Cardiovascular: Regular rate rhythm, S1-S2 Abdomen: Soft nontender nondistended Extremities: No significant edema Neuro: Awake alert following commands GU: deferred  Resolved Hospital Problem list   Hydronephrosis and obstructive nephropathy Acute renal failure  Assessment & Plan:   Severe COPD with apparent acute exacerbation with associated acute on chronic hypoxemic respiratory failure.  He has been wearing 4 L/min at home, apparently saturates adequately at rest but sees desaturations with any exertion including just standing to walk.  Suspect that he has needed his O2 up titration prior to this admission, certainly with exertion.  Currently on 6 L/min. Plan: Agree with prednisone taper Can switch to oral prednisone 40 mg daily, decrease by 10 mg over a 2-week.  Every 3 to 4 days. Complete 5 days of azithromycin. Continued scheduled nebs At time of discharge discharged on Trelegy. Continue mobility. He is close to being back to his baseline after discussion with him this morning.  Pulmonary will follow.  Chronic allergic rhinitis, Plan: Continue loratadine and fluticasone  Prostate cancer BPH with obstructive nephropathy - Foley in place.   Labs   CBC: Recent Labs  Lab 08/04/22 1353 08/04/22 1911 08/05/22 0539 08/06/22 0622 08/07/22 0810  WBC 17.1* 13.5* 10.7* 11.8* 9.7  NEUTROABS 14.6* 13.1*  --  10.3*  --   HGB 13.5 12.9* 10.8* 10.9* 11.9*  HCT 42.3 40.6  35.3* 35.1* 39.1  MCV 89.2 91.2 94.1 94.1 94.4  PLT 194 187 162 169 [id number]    Basic Metabolic Panel: Recent Labs  Lab 08/04/22 1353 08/04/22 1911 08/05/22 0539 08/06/22 0622  NA 132* 137 140 140  K 4.9 4.3 4.7 4.2  CL 85* 96* 100 96*  CO2 33* 31 30 35*  GLUCOSE 111* 121* 92 124*  BUN 61* 51* 40* 32*  CREATININE 4.20* 2.54* 1.23 0.78  CALCIUM 9.6 8.9 8.4* 8.8*  MG  --  2.9*  --  2.1  PHOS  --  5.2*  --  2.8   GFR: CrCl cannot be calculated (Unknown ideal weight.). Recent Labs  Lab 08/04/22 1911 08/05/22 0539 08/06/22 0622 08/07/22 0810  WBC 13.5* 10.7* 11.8* 9.7    Liver Function Tests: Recent Labs  Lab 08/04/22 1911 08/06/22 0622  AST 27  --   ALT 15  --   ALKPHOS 61  --   BILITOT 0.7  --   PROT 6.9  --   ALBUMIN 3.5 3.0*   No results for input(s): "LIPASE", "AMYLASE" in the last 168 hours. No results for input(s): "AMMONIA" in the last 168 hours.  ABG    Component Value Date/Time   PHART 7.36 08/05/2022 1454   PCO2ART 70 (HH) 08/05/2022 1454   PO2ART 76 (L) 08/05/2022 1454   HCO3 39.5 (H) 08/05/2022 1454   TCO2 37 (H) 02/28/2020 1233   O2SAT 97 08/05/2022 1454     Coagulation Profile: No results for input(s): "INR", "PROTIME" in the last 168 hours.  Cardiac Enzymes: No results for input(s): "CKTOTAL", "CKMB", "CKMBINDEX", "TROPONINI" in the last 168 hours.  HbA1C: Hemoglobin A1C  Date/Time Value Ref Range Status  06/30/2021 02:05 PM 5.6 4.0 - 5.6 % Final  06/12/2014 02:15 PM 5.7  Final    CBG: No results for input(s): "GLUCAP" in the last 168 hours.  Review of Systems:   As per HPI  Past Medical History:  He,  has a past medical history of Arthritis, Cancer ([place]), COPD (chronic obstructive pulmonary disease) ([place]) (06/05/2012), Dyspnea, Dysrhythmia, Emphysema (06/07/2012), Heart murmur, History of kidney stones, Hypoxemia (06/07/2012), Obstructive chronic bronchitis with exacerbation ([place]) (06/07/2012), Pneumonia, and Tobacco abuse  (06/05/2012).   Surgical History:   Past Surgical History:  Procedure Laterality Date   CATARACT EXTRACTION, BILATERAL     CYSTOSCOPY WITH LITHOLAPAXY N/A 01/13/2022   Procedure: CYSTOSCOPY WITH LITHOLAPAXY;  Surgeon: Alexis Frock, MD;  Location: [facility];  Service: Urology;  Laterality: N/A;   CYSTOSCOPY/RETROGRADE/URETEROSCOPY/STONE EXTRACTION WITH BASKET Right 10/04/2016   Procedure: CYSTOSCOPY/RETROGRADE/URETEROSCOPY/STONE EXTRACTION WITH BASKET/ STENT;  Surgeon: Alexis Frock, MD;  Location: [facility];  Service: Urology;  Laterality: Right;  With LASER   CYSTOSCOPY/URETEROSCOPY/HOLMIUM LASER/STENT PLACEMENT Right 01/13/2022   Procedure: CYSTOSCOPY/URETEROSCOPY/HOLMIUM LASER/STENT PLACEMENT;  Surgeon: Alexis Frock, MD;  Location: [facility];  Service: Urology;  Laterality: Right;   EXTRACORPOREAL SHOCK WAVE LITHOTRIPSY Right 06/29/2016   Procedure: RIGHT EXTRACORPOREAL SHOCK WAVE LITHOTRIPSY (ESWL);  Surgeon: Ardis Hughs, MD;  Location: [facility];  Service: Urology;  Laterality: Right;     Social History:   reports that he quit smoking about 7 years  ago. His smoking use included cigarettes. He has a 17.50 pack-year smoking history. He quit smokeless tobacco use about 54 years ago.  His smokeless tobacco use included chew. He reports that he does not drink alcohol and does not use drugs.   Family History:  His family history includes COPD in his mother; Colon polyps in his father and sister; Heart attack (age of onset: 35) in his father.   Allergies No Known Allergies   Home Medications  Prior to Admission medications   Medication Sig Start Date End Date Taking? Authorizing Provider  albuterol (ACCUNEB) 0.63 MG/3ML nebulizer solution USE 1 VIAL IN NEBULIZER EVERY 6 HOURS Patient taking differently: Take 1 ampule by nebulization every 6 (six) hours as needed for shortness of breath. USE 1 VIAL IN NEBULIZER EVERY 6 HOURS 03/07/22  Yes [name], Rose Fillers, MD  albuterol (VENTOLIN HFA) 108 (90  Base) MCG/ACT inhaler INHALE 2 PUFFS BY MOUTH EVERY 4 HOURS AS NEEDED FOR WHEEZING OR SHORTNESS OF BREATH Patient taking differently: Inhale 1 puff into the lungs every 6 (six) hours as needed for shortness of breath. 07/03/22  Yes Margaretha Seeds, MD  calcium-vitamin D (OSCAL WITH D) 500-5 MG-MCG tablet Take 1 tablet by mouth daily with breakfast.   Yes [provider]  fluticasone (FLONASE) 50 MCG/ACT nasal spray Place 1 spray into both nostrils daily. 08/10/20  Yes Collene Gobble, MD  Fluticasone-Umeclidin-Vilant (TRELEGY ELLIPTA) 100-62.5-25 MCG/ACT AEPB INHALE 1 PUFF INTO THE LUNGS DAILY Patient taking differently: Take 1 puff by mouth daily. 07/03/22  Yes Margaretha Seeds, MD  guaiFENesin (MUCINEX) 600 MG 12 hr tablet Take 600 mg by mouth 2 (two) times daily as needed for to loosen phlegm or cough.   Yes [provider]  OXYGEN Inhale 4 L/min into the lungs continuous.    Yes [provider]  predniSONE (DELTASONE) 10 MG tablet TAKE 1 TABLET(10 MG) BY MOUTH DAILY WITH BREAKFAST Patient taking differently: Take 10 mg by mouth daily with breakfast. 07/03/22  Yes Margaretha Seeds, MD  tamsulosin (FLOMAX) 0.4 MG CAPS capsule TAKE 1 CAPSULE(0.4 MG) BY MOUTH DAILY Patient taking differently: Take 0.4 mg by mouth daily. 02/17/22  Yes Delene Ruffini, MD  oxyCODONE-acetaminophen (PERCOCET) 5-325 MG tablet Take 1 tablet by mouth every 6 (six) hours as needed for severe pain or moderate pain (post-operatively). Patient not taking: Reported on 08/05/2022 01/13/22 01/13/23  Alexis Frock, MD     Critical care time: NA     Garner Nash, DO [facility] 08/07/2022 11:42 AM

## 2022-08-07 NOTE — Progress Notes (Signed)
Physical Therapy Treatment Patient Details Name: Frank [name] MRN: [id number] DOB: [birth year] Today's Date: 08/07/2022   History of Present Illness Patient is a 67 year old male who presented with SOB,and inability to pass urine.  Patient was admitted with COPD exacerbation, acute on chronic respiratory failure, AKI, and urinary retention. PMH: prostate cancer, BPH, UTI symptoms, cancer, nephrolithiasis.    PT Comments    General Comments: AxO x 4 very pleasant and willing.  Admits to "getting tired" and "I can't do much". Assisted OOB to amb required increased time.  Also monitoring. General transfer comment: VC's for hand placement as well as increased time due to effort.  Fatiigues quickly. General Gait Details: limited amb distance due to Max c/o fatigue and 3/4 dyspnea.  Amb 12 feet, seated rest break then another 18 feet.   SATURATION QUALIFICATIONS: (This note is used to comply with regulatory documentation for home oxygen)   Patient Saturations on Room Air at Rest =  unable to trial RA (Hx oxygen 4 lts prior to admit)   Patient Saturations on 6 lts at rest 93%   Patient Saturations on Room Air while Ambulating =  unable to trial RA   Patient Saturations on 6 Liters of oxygen while Ambulating 12 feet + 18 feet = 83%   Please briefly explain why patient needs home oxygen:  Pt requires increased supplemental oxygen of 6 lts to maintain therapeutic levels.    Pt lives home alone.      Recommendations for follow up therapy are one component of a multi-disciplinary discharge planning process, led by the attending physician.  Recommendations may be updated based on patient status, additional functional criteria and insurance authorization.  Follow Up Recommendations       Assistance Recommended at Discharge PRN  Patient can return home with the following Assistance with cooking/housework;Help with stairs or ramp for entrance   Equipment Recommendations  Rollator (4 wheels)     Recommendations for Other Services       Precautions / Restrictions Precautions Precautions: Fall Precaution Comments: monitor O2, foley Restrictions Weight Bearing Restrictions: No     Mobility  Bed Mobility Overal bed mobility: Needs Assistance Bed Mobility: Supine to Sit     Supine to sit: Supervision     General bed mobility comments: increased time and use of rail.    Transfers Overall transfer level: Needs assistance Equipment used: Rolling walker (2 wheels) Transfers: Sit to/from Stand Sit to Stand: Min guard, Min assist           General transfer comment: VC's for hand placement as well as increased time due to effort.  Fatiigues quickly.    Ambulation/Gait Ambulation/Gait assistance: Min assist Gait Distance (Feet): 30 Feet (12 feet and 18 feet) Assistive device: Rolling walker (2 wheels) Gait Pattern/deviations: Step-through pattern, Decreased stride length Gait velocity: decreased     General Gait Details: limited amb distance due to Max c/o fatigue and 3/4 dyspnea.  Amb 12 feet, seated rest break then another 18 feet.   Stairs             Wheelchair Mobility    Modified Rankin (Stroke Patients Only)       Balance                                            Cognition Arousal/Alertness: Awake/alert Behavior During Therapy: [facility]  for tasks assessed/performed Overall Cognitive Status: Within Functional Limits for tasks assessed                                 General Comments: AxO x 4 very pleasant and willing.  Admits to "getting tired" and "I can't do much".        Exercises      General Comments        Pertinent Vitals/Pain Pain Assessment Pain Assessment: Faces Faces Pain Scale: Hurts a little bit Pain Location: back Pain Descriptors / Indicators: Constant, Discomfort Pain Intervention(s): Monitored during session, Repositioned    Home Living                           Prior Function            PT Goals (current goals can now be found in the care plan section) Progress towards PT goals: Progressing toward goals    Frequency    Min 3X/week      PT Plan Current plan remains appropriate    Co-evaluation              AM-PAC PT "6 Clicks" Mobility   Outcome Measure  Help needed turning from your back to your side while in a flat bed without using bedrails?: A Little Help needed moving from lying on your back to sitting on the side of a flat bed without using bedrails?: A Little Help needed moving to and from a bed to a chair (including a wheelchair)?: A Little Help needed standing up from a chair using your arms (e.g., wheelchair or bedside chair)?: A Little Help needed to walk in hospital room?: A Little Help needed climbing 3-5 steps with a railing? : A Little 6 Click Score: 18    End of Session Equipment Utilized During Treatment: Gait belt Activity Tolerance: Patient tolerated treatment well Patient left: in chair;with call bell/phone within reach;with chair alarm set Nurse Communication: Mobility status PT Visit Diagnosis: Difficulty in walking, not elsewhere classified (R26.2)     Time: [id number] PT Time Calculation (min) (ACUTE ONLY): 26 min  Charges:  $Gait Training: 8-22 mins $Therapeutic Activity: 8-22 mins                     Rica Koyanagi  PTA Acute  Rehabilitation Services Office M-F          [phone]

## 2022-08-07 NOTE — Progress Notes (Addendum)
OT Cancellation Note  Patient Details Name: Frank [name] MRN: [id number] DOB: 1955-10-10   Cancelled Treatment:    Reason Eval/Treat Not Completed: Medical issues which prohibited therapy Upon entrance to room, patient reported feeling SOB. Telebox was checked and patients O2 was 84% on 6L/min at rest sitting upright in bed. Nurse was called into room. O2 line was in tact. Patient left with nurse in room at this time. OT to continue to follow and check back as schedule will allow.  [name], MS Acute Rehabilitation Department Office# [phone]  08/07/2022, 8:23 AM

## 2022-08-07 NOTE — Progress Notes (Signed)
PROGRESS NOTE    Frank [name]  [id number] DOB: [birth year] DOA: 08/04/2022 PCP: Serita Butcher, MD    Brief Narrative:   Frank [name] is a 67 y.o. 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  In the ED, temperature 97.7 F, HR 116, RR 20, BP 120/87, SpO2 79% 4 L nasal cannula.  WBC 17.1, hemoglobin 13.5, platelets 194.  BNP 49.0, high sensitive troponin 11.  Sodium 132, potassium 4.9, chloride 85, CO2 33, glucose 111, BUN 61, creatinine 4.20.  Urinalysis unrevealing.  Chest x-ray with chronic obstructive pulmonary disease, no evidence of acute cardiopulmonary disease process, no consolidations noted.  Renal ultrasound with extrarenal pelvis noted to each kidney, no separate collecting system dilation, parapelvic right-sided renal cyst.  CT renal stone study with interval development of mild bilateral renal collecting system dilation, right greater than left down to the bladder, calcifications along the bladder on the right side near the margin of the right UVJ, otherwise no ureteral stones, punctate nonobstructing right renal stone, Foley catheter noted in bladder with contracted bladder, increasing retroperitoneal stranding diffusely, heterogeneous prostate with a large appearance no bowel obstruction, no free air, normal-appearing appendix, small hiatal hernia.  Foley catheter was placed in the ED.  Patient received DuoNeb.  TRH consulted for admission for further evaluation management of obstructive uropathy, acute renal failure, acute on chronic respite failure secondary to COPD exacerbation.  Assessment & Plan:   Acute on chronic respiratory failure with hypoxia COPD exacerbation Patient presenting to ED with progressive shortness of breath.  At baseline  oxygen dependent with 4 L per nasal cannula.  SpO2 79% on admission.  Chest x-ray with no focal consolidation. -- Pulmonology following, appreciate assistance -- Azithromycin 250 mg p.o. daily x 5 days -- Solu-Medrol 40 mg IV  q12h (prednisone taper closer to discharge; decrease by 10 mg every 3-4 days, plan 2-week taper) -- DuoNebs every 6 hours -- Pulmicort neb twice daily -- Mucinex 600 mg p.o. twice daily PRN -- Continue supplemental oxygen, maintain SpO2 greater than 88% --Flutter valve, incentive spirometry -- Ambulatory O2 screen on his baseline 4 L nasal, Daily  Acute renal failure Obstructive uropathy BPH Follows with urology outpatient, Dr. Tresa Moore.  Creatinine elevated 4.2 on admission.  Foley catheter was placed in the ED.  Urology was consulted and recommended continue Foley catheter and outpatient follow-up in the clinic for voiding trial. -- Cr 4.2>>0.78 -- Continue Foley catheter -- Tamsulosin 0.4 mg p.o. daily -- Monitor urine output  Prostate cancer --Continue outpatient follow-up with urology  DVT prophylaxis: heparin injection 5,000 Units Start: 08/05/22 2200    Code Status: Full Code Family Communication: Present at bedside this morning  Disposition Plan:  Level of care: Telemetry Status is: Inpatient Remains inpatient appropriate because: IV steroids, pending SNF placement    Consultants:  Urology, Dr. Junious Silk Pulmonology  Procedures:  Foley catheter placement 3/29  Antimicrobials:  Azithromycin   Subjective: Patient seen examined bedside, resting.  Lying in bed.  Sleeping but easily arousable.  Reports shortness of breath slightly improved today.  Continues to desaturate significantly with therapy.  Currently therapy recommending SNF placement, TOC consulted.  Discussed with pulmonology today.  Remains on IV steroids, azithromycin.  No other specific complaints or concerns at this time.  Denies  headache, no dizziness, no chest pain, no palpitations,  no abdominal pain, no fever/chills/night sweats, no nausea/vomiting/diarrhea.  No acute events overnight per nursing staff.  Objective: Vitals:   08/06/22 1946 08/07/22 0421 08/07/22 1305 08/07/22 1355  BP:  (!) 144/81  120/63  Pulse:  93  85  Resp:  18  19  Temp:  (!) 97.5 F (36.4 C)  98.1 F (36.7 C)  TempSrc:  Oral  Oral  SpO2: 98% 96% 93% 94%    Intake/Output Summary (Last 24 hours) at 08/07/2022 1542 Last data filed at 08/07/2022 [id number] Gross per 24 hour  Intake 120 ml  Output 2100 ml  Net -1980 ml   There were no vitals filed for this visit.  Examination:  Physical Exam: GEN: NAD, alert and oriented x 3, chronically ill appearance, appears older than stated age HEENT: NCAT, PERRL, EOMI, sclera clear, MMM PULM: Breath sounds bilateral bases, mid to late expiratory wheezing throughout all lung fields, no crackles, normal respiratory effort without accessory muscle use, on 6 L nasal cannula with SpO2 96% at rest. CV: RRR w/o M/G/R GI: abd soft, NTND, NABS, no R/G/M MSK: no peripheral edema, moves all EXTR independently NEURO: No focal neurological deficits appreciated PSYCH: normal mood/affect Integumentary: dry/intact, no rashes or wounds    Data Reviewed: I have personally reviewed following labs and imaging studies  CBC: Recent Labs  Lab 08/04/22 1353 08/04/22 1911 08/05/22 0539 08/06/22 0622 08/07/22 0810  WBC 17.1* 13.5* 10.7* 11.8* 9.7  NEUTROABS 14.6* 13.1*  --  10.3*  --   HGB 13.5 12.9* 10.8* 10.9* 11.9*  HCT 42.3 40.6 35.3* 35.1* 39.1  MCV 89.2 91.2 94.1 94.1 94.4  PLT 194 187 162 169 [id number]   Basic Metabolic Panel: Recent Labs  Lab 08/04/22 1353 08/04/22 1911 08/05/22 0539 08/06/22 0622  NA 132* 137 140 140  K 4.9 4.3 4.7 4.2  CL 85* 96* 100 96*  CO2 33* 31 30 35*  GLUCOSE 111* 121* 92 124*  BUN 61* 51* 40* 32*  CREATININE 4.20* 2.54* 1.23 0.78  CALCIUM 9.6 8.9 8.4* 8.8*  MG  --  2.9*  --  2.1  PHOS  --  5.2*  --  2.8   GFR: CrCl cannot  be calculated (Unknown ideal weight.). Liver Function Tests: Recent Labs  Lab 08/04/22 1911 08/06/22 0622  AST 27  --   ALT 15  --   ALKPHOS 61  --   BILITOT 0.7  --   PROT 6.9  --   ALBUMIN 3.5 3.0*   No results for input(s): "LIPASE", "AMYLASE" in the last 168 hours. No results for input(s): "AMMONIA" in the last 168 hours. Coagulation Profile: No results for input(s): "INR", "PROTIME" in the last 168 hours. Cardiac Enzymes: No results for input(s): "CKTOTAL", "CKMB", "CKMBINDEX", "TROPONINI" in the last 168 hours. BNP (last 3 results) No results for input(s): "PROBNP" in the last 8760 hours. HbA1C: No results for input(s): "HGBA1C" in the last 72 hours. CBG: No results for input(s): "GLUCAP" in the last 168 hours. Lipid Profile: No results for input(s): "CHOL", "HDL", "LDLCALC", "TRIG", "CHOLHDL", "LDLDIRECT" in the last 72 hours. Thyroid Function Tests: No results for input(s): "TSH", "T4TOTAL", "FREET4", "T3FREE", "THYROIDAB" in the last 72 hours. Anemia Panel: No results for input(s): "VITAMINB12", "FOLATE", "FERRITIN", "TIBC", "IRON", "RETICCTPCT" in the last 72 hours. Sepsis Labs: No results for input(s): "PROCALCITON", "LATICACIDVEN" in the last 168 hours.  Recent Results (from the past 240 hour(s))  Respiratory (~20 pathogens) panel  by PCR     Status: None   Collection Time: 08/04/22  6:56 PM   Specimen: Nasopharyngeal Swab; Respiratory  Result Value Ref Range Status   Adenovirus NOT DETECTED NOT DETECTED Final   Coronavirus 229E NOT DETECTED NOT DETECTED Final    Comment: (NOTE) The Coronavirus on the Respiratory Panel, DOES NOT test for the novel  Coronavirus (2019 nCoV)    Coronavirus HKU1 NOT DETECTED NOT DETECTED Final   Coronavirus NL63 NOT DETECTED NOT DETECTED Final   Coronavirus OC43 NOT DETECTED NOT DETECTED Final   Metapneumovirus NOT DETECTED NOT DETECTED Final   Rhinovirus / Enterovirus NOT DETECTED NOT DETECTED Final   Influenza A NOT DETECTED  NOT DETECTED Final   Influenza B NOT DETECTED NOT DETECTED Final   Parainfluenza Virus 1 NOT DETECTED NOT DETECTED Final   Parainfluenza Virus 2 NOT DETECTED NOT DETECTED Final   Parainfluenza Virus 3 NOT DETECTED NOT DETECTED Final   Parainfluenza Virus 4 NOT DETECTED NOT DETECTED Final   Respiratory Syncytial Virus NOT DETECTED NOT DETECTED Final   Bordetella pertussis NOT DETECTED NOT DETECTED Final   Bordetella Parapertussis NOT DETECTED NOT DETECTED Final   Chlamydophila pneumoniae NOT DETECTED NOT DETECTED Final   Mycoplasma pneumoniae NOT DETECTED NOT DETECTED Final    Comment: Performed at [facility], [place]. [address], [place], [place] [address]  Resp panel by RT-PCR (RSV, Flu A&B, Covid) Anterior Nasal Swab     Status: None   Collection Time: 08/04/22  6:56 PM   Specimen: Anterior Nasal Swab  Result Value Ref Range Status   SARS Coronavirus 2 by RT PCR NEGATIVE NEGATIVE Final    Comment: (NOTE) SARS-CoV-2 target nucleic acids are NOT DETECTED.  The SARS-CoV-2 RNA is generally detectable in upper respiratory specimens during the acute phase of infection. The lowest concentration of SARS-CoV-2 viral copies this assay can detect is 138 copies/mL. A negative result does not preclude SARS-Cov-2 infection and should not be used as the sole basis for treatment or other patient management decisions. A negative result may occur with  improper specimen collection/handling, submission of specimen other than nasopharyngeal swab, presence of viral mutation(s) within the areas targeted by this assay, and inadequate number of viral copies(<138 copies/mL). A negative result must be combined with clinical observations, patient history, and epidemiological information. The expected result is Negative.  Fact Sheet for Patients:  EntrepreneurPulse.com.au  Fact Sheet for Healthcare Providers:  IncredibleEmployment.be  This test is no t yet approved  or cleared by the [place] FDA and  has been authorized for detection and/or diagnosis of SARS-CoV-2 by FDA under an Emergency Use Authorization (EUA). This EUA will remain  in effect (meaning this test can be used) for the duration of the COVID-19 declaration under Section 564(b)(1) of the Act, 21 U.S.C.section 360bbb-3(b)(1), unless the authorization is terminated  or revoked sooner.       Influenza A by PCR NEGATIVE NEGATIVE Final   Influenza B by PCR NEGATIVE NEGATIVE Final    Comment: (NOTE) The Xpert Xpress SARS-CoV-2/FLU/RSV plus assay is intended as an aid in the diagnosis of influenza from Nasopharyngeal swab specimens and should not be used as a sole basis for treatment. Nasal washings and aspirates are unacceptable for Xpert Xpress SARS-CoV-2/FLU/RSV testing.  Fact Sheet for Patients: EntrepreneurPulse.com.au  Fact Sheet for Healthcare Providers: IncredibleEmployment.be  This test is not yet approved or cleared by the [place] FDA and has been authorized for detection and/or diagnosis of SARS-CoV-2 by FDA  under an Emergency Use Authorization (EUA). This EUA will remain in effect (meaning this test can be used) for the duration of the COVID-19 declaration under Section 564(b)(1) of the Act, 21 U.S.C. section 360bbb-3(b)(1), unless the authorization is terminated or revoked.     Resp Syncytial Virus by PCR NEGATIVE NEGATIVE Final    Comment: (NOTE) Fact Sheet for Patients: EntrepreneurPulse.com.au  Fact Sheet for Healthcare Providers: IncredibleEmployment.be  This test is not yet approved or cleared by the [place] FDA and has been authorized for detection and/or diagnosis of SARS-CoV-2 by FDA under an Emergency Use Authorization (EUA). This EUA will remain in effect (meaning this test can be used) for the duration of the COVID-19 declaration under Section 564(b)(1) of the Act,  21 U.S.C. section 360bbb-3(b)(1), unless the authorization is terminated or revoked.  Performed at [facility], [place] [address], [place], [place] [address]          Radiology Studies: No results found.      Scheduled Meds:  azithromycin  250 mg Oral Daily   budesonide (PULMICORT) nebulizer solution  0.25 mg Nebulization BID   Chlorhexidine Gluconate Cloth  6 each Topical Daily   fluticasone  2 spray Each Nare BID   heparin injection (subcutaneous)  5,000 Units Subcutaneous Q8H   ipratropium-albuterol  3 mL Nebulization QID   loratadine  10 mg Oral Daily   methylPREDNISolone (SOLU-MEDROL) injection  40 mg Intravenous Q12H   tamsulosin  0.4 mg Oral Daily   Continuous Infusions:   LOS: 2 days    Time spent: 52 minutes spent on chart review, discussion with nursing staff, consultants, updating family and interview/physical exam; more than 50% of that time was spent in counseling and/or coordination of care.    Egan Berkheimer J [place] DO [facility] Available via Epic secure chat 7am-7pm After these hours, please refer to coverage provider listed on [email or web address] 08/07/2022, 3:42 PM

## 2022-08-08 DIAGNOSIS — J441 Chronic obstructive pulmonary disease with (acute) exacerbation: Secondary | ICD-10-CM | POA: Diagnosis not present

## 2022-08-08 NOTE — Progress Notes (Signed)
Mobility Specialist - Progress Note   08/08/22 1214  Oxygen Therapy  SpO2 90 %  O2 Device Nasal Cannula  O2 Flow Rate (L/min) 6 L/min  Mobility  Activity Ambulated with assistance in hallway  Level of Assistance Standby assist, set-up cues, supervision of patient - no hands on  Assistive Device Front wheel walker  Distance Ambulated (ft) 40 ft  Activity Response Tolerated well  Mobility Referral Yes  $Mobility charge 1 Mobility   Pt received in chair and agreed to mobility.  Nurse requested Mobility Specialist to perform oxygen saturation test with pt which includes removing pt from oxygen both at rest and while ambulating.  Below are the results from that testing.     Patient Saturations on Room Air at Rest = spO2 87%  Patient Saturations on 6 Liters of oxygen while Ambulating = sp02 72%  Returned to room and under 2 minutes pt returned to 90% SpO2 on 6 liters.  At end of testing pt left in room on 6  Liters of oxygen.  Reported results to nurse.   Pt returned to chair with all needs met and alarm on.   Roderick Pee Mobility Specialist

## 2022-08-08 NOTE — Progress Notes (Signed)
NAME:  Frank [name], MRN:  [id number], DOB:  October 15, 1955, LOS: 3 ADMISSION DATE:  08/04/2022, CONSULTATION DATE: 08/06/2022 REFERRING MD: Dr. Marthenia Rolling, CHIEF COMPLAINT: COPD, hypoxemia  History of Present Illness:  Frank [name] is 49, has a history of severe emphysematous COPD with associated chronic hypoxemic respiratory failure, usually on 4 L/min based on office notes.  I follow him in the outpatient setting.  FEV1 in 2014 was 55% predicted.  He his history of prostate cancer with BPH, renal calculi.  He was admitted 3/29 with obstructive nephropathy and acute renal failure due to bladder outlet obstruction.  Decompression with a Foley catheter was performed with improvement in bilateral hydronephrosis, acute renal failure.  He was also dyspneic, noted to be wheezing and has been treated for an acute exacerbation of COPD.  Chest x-ray 3/30 with hyperinflation, emphysema but no acute infiltrates.  At home he is on Trelegy, maintenance prednisone 10 mg daily.  He is being treated with scheduled DuoNeb, Pulmicort nebs, Flonase, Solu-Medrol 40 mg every 12 hours.  He remains on 6 L/min.  PCCM consulted to assist with his care.  Pertinent  Medical History   Past Medical History:  Diagnosis Date   Arthritis    Cancer ([place])    COPD (chronic obstructive pulmonary disease) ([place]) 06/05/2012   Dyspnea    Dysrhythmia    Emphysema 06/07/2012   Heart murmur    hx of as baby   History of kidney stones    Hypoxemia 06/07/2012   Obstructive chronic bronchitis with exacerbation ([place]) 06/07/2012   Pneumonia    Tobacco abuse 06/05/2012   Significant Hospital Events: Including procedures, antibiotic start and stop dates in addition to other pertinent events   PFT 2014 FEV1 was 1.69 L (55% predicted) consistent with severe obstruction 3/29 Foley catheter placed Solu-Medrol, nebs for acute exacerbation COPD  Interim History / Subjective:   He was on 5 L/min earlier, had to go back up to 6 L/min when he  was moved to chair Still has some nasal obstruction, may be slightly better  Objective   Blood pressure (!) 141/76, pulse 91, temperature 97.9 F (36.6 C), temperature source Oral, resp. rate 18, SpO2 92 %.        Intake/Output Summary (Last 24 hours) at 08/08/2022 1603 Last data filed at 08/08/2022 1300 Gross per 24 hour  Intake 480 ml  Output 1550 ml  Net -1070 ml   There were no vitals filed for this visit.  Examination: General: Elderly gentleman, chronically ill-appearing no distress sitting up in chair HENT: NCAT, poor dentition Lungs: Expiratory wheezing faint, bilaterally Cardiovascular: Regular rate rhythm, S1-S2 Abdomen: Soft nontender nondistended Extremities: No significant edema Neuro: Awake alert following commands GU: deferred  Resolved Hospital Problem list   Hydronephrosis and obstructive nephropathy Acute renal failure  Assessment & Plan:   Severe COPD with apparent acute exacerbation with associated acute on chronic hypoxemic respiratory failure.  He has been wearing 4 L/min at home, apparently saturates adequately at rest but sees desaturations with any exertion including just standing to walk.  Suspect that he has needed his O2 up titration prior to this admission, certainly with exertion.  Currently on 6 L/min. Plan: -Agree with prednisone and plan for taper back down to his usual 10 mg daily -Complete 5 days of azithromycin -Scheduled nebs as ordered -He should go back on his home Trelegy at the time of discharge -Work to improve his nasal obstruction and thereby his oxygen delivery by nasal cannula -  He will need his oxygen titrated for discharge to home, appears he will need to be on more than 4 L/min  Chronic allergic rhinitis, Plan: -Continue loratadine and fluticasone (increased dose)  Prostate cancer BPH with obstructive nephropathy -Foley catheter in place, planning for urology follow-up outpatient   Labs   CBC: Recent Labs  Lab  08/04/22 1353 08/04/22 1911 08/05/22 0539 08/06/22 0622 08/07/22 0810  WBC 17.1* 13.5* 10.7* 11.8* 9.7  NEUTROABS 14.6* 13.1*  --  10.3*  --   HGB 13.5 12.9* 10.8* 10.9* 11.9*  HCT 42.3 40.6 35.3* 35.1* 39.1  MCV 89.2 91.2 94.1 94.1 94.4  PLT 194 187 162 169 [id number]    Basic Metabolic Panel: Recent Labs  Lab 08/04/22 1353 08/04/22 1911 08/05/22 0539 08/06/22 0622  NA 132* 137 140 140  K 4.9 4.3 4.7 4.2  CL 85* 96* 100 96*  CO2 33* 31 30 35*  GLUCOSE 111* 121* 92 124*  BUN 61* 51* 40* 32*  CREATININE 4.20* 2.54* 1.23 0.78  CALCIUM 9.6 8.9 8.4* 8.8*  MG  --  2.9*  --  2.1  PHOS  --  5.2*  --  2.8   GFR: CrCl cannot be calculated (Unknown ideal weight.). Recent Labs  Lab 08/04/22 1911 08/05/22 0539 08/06/22 0622 08/07/22 0810  WBC 13.5* 10.7* 11.8* 9.7    Liver Function Tests: Recent Labs  Lab 08/04/22 1911 08/06/22 0622  AST 27  --   ALT 15  --   ALKPHOS 61  --   BILITOT 0.7  --   PROT 6.9  --   ALBUMIN 3.5 3.0*   No results for input(s): "LIPASE", "AMYLASE" in the last 168 hours. No results for input(s): "AMMONIA" in the last 168 hours.  ABG    Component Value Date/Time   PHART 7.36 08/05/2022 1454   PCO2ART 70 (HH) 08/05/2022 1454   PO2ART 76 (L) 08/05/2022 1454   HCO3 39.5 (H) 08/05/2022 1454   TCO2 37 (H) 02/28/2020 1233   O2SAT 97 08/05/2022 1454      Critical care time: NA     [name], MD, PhD 08/08/2022, 4:03 PM [facility] [phone] or if no answer before 7:00PM call [phone] For any issues after 7:00PM please call eLink [phone]

## 2022-08-08 NOTE — Care Management Important Message (Signed)
Important Message  Patient Details IM Letter given. Name: Frank [name] MRN: [id number] Date of Birth: Nov 10, 1955   Medicare Important Message Given:  Yes     [name] 08/08/2022, 9:26 AM

## 2022-08-08 NOTE — Progress Notes (Signed)
Occupational Therapy Treatment Patient Details Name: Frank [name] MRN: [id number] DOB: 07/01/1955 Today's Date: 08/08/2022   History of present illness Patient is a 67 year old male who presented with SOB,and inability to pass urine.  Patient was admitted with COPD exacerbation, acute on chronic respiratory failure, AKI, and urinary retention. PMH: prostate cancer, BPH, UTI symptoms, cancer, nephrolithiasis.   OT comments  Patient reported he was having a "hot flash" but still agreeable to attempt to participate in session. Patient was on 5L/min at start of session at rest in bed. Patient was noted to have O2 drop from 94% on 5L to 87% on 5L with transition to EOB. Patient was bumped up to 6L/min and returned to 90% on 6L/min with increased time for breathing. Patient was noted to have 84% on 6L/min after transfer with increased time to deep breath back up. Patient's discharge plan remains appropriate at this time. OT will continue to follow acutely.     Recommendations for follow up therapy are one component of a multi-disciplinary discharge planning process, led by the attending physician.  Recommendations may be updated based on patient status, additional functional criteria and insurance authorization.    Assistance Recommended at Discharge Frequent or constant Supervision/Assistance  Patient can return home with the following  A little help with walking and/or transfers;A lot of help with bathing/dressing/bathroom;Assistance with cooking/housework;Direct supervision/assist for medications management;Assist for transportation;Help with stairs or ramp for entrance;Direct supervision/assist for financial management   Equipment Recommendations  None recommended by OT       Precautions / Restrictions Precautions Precautions: Fall Precaution Comments: monitor O2, foley Restrictions Weight Bearing Restrictions: No       Mobility Bed Mobility Overal bed mobility: Needs Assistance Bed  Mobility: Supine to Sit     Supine to sit: Supervision     General bed mobility comments: increased time and use of rail.            ADL either performed or assessed with clinical judgement   ADL Overall ADL's : Needs assistance/impaired       Grooming Details (indicate cue type and reason): patient declined to participate in grooming tasks on this date.                   Toilet Transfer Details (indicate cue type and reason): patient was min guard to trnasfer to bed on 6L/min with 84% on 6L/in with one min to recover. patietn was edufate don breating in through nose and pursed lip breathing. patient was  provided with incentive spirometer at end of session to continue to practice with. patient declined to further participte reporting increased fatigue at this time,.                  Cognition Arousal/Alertness: Awake/alert Behavior During Therapy: WFL for tasks assessed/performed Overall Cognitive Status: Within Functional Limits for tasks assessed                       Pertinent Vitals/ Pain       Pain Assessment Pain Assessment: No/denies pain         Frequency  Min 2X/week        Progress Toward Goals  OT Goals(current goals can now be found in the care plan section)  Progress towards OT goals: Not progressing toward goals - comment (increased O2 needs and decreased functional activity tolerance)     Plan Discharge plan remains appropriate  AM-PAC OT "6 Clicks" Daily Activity     Outcome Measure   Help from another person eating meals?: A Little Help from another person taking care of personal grooming?: A Little Help from another person toileting, which includes using toliet, bedpan, or urinal?: A Little Help from another person bathing (including washing, rinsing, drying)?: A Lot Help from another person to put on and taking off regular upper body clothing?: A Little Help from another person to put on and taking off regular  lower body clothing?: A Lot 6 Click Score: 16    End of Session Equipment Utilized During Treatment: Rolling walker (2 wheels)  OT Visit Diagnosis: Unsteadiness on feet (R26.81);Other abnormalities of gait and mobility (R26.89);Muscle weakness (generalized) (M62.81)   Activity Tolerance Patient tolerated treatment well   Patient Left in chair;with call bell/phone within reach;with chair alarm set   Nurse Communication Other (comment) (ok to participate, patients O2 during session)        Time: 1038-1050 OT Time Calculation (min): 12 min  Charges: OT General Charges $OT Visit: 1 Visit OT Treatments $Therapeutic Activity: 8-22 mins  [name], MS Acute Rehabilitation Department Office# [phone]   [name] 08/08/2022, 12:58 PM

## 2022-08-08 NOTE — Progress Notes (Signed)
PROGRESS NOTE    [name]  [id number] DOB: 1956-04-29 DOA: 08/04/2022 PCP: Serita Butcher, MD    Brief Narrative:   Frank [name] is a 67 y.o. 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  In the ED, temperature 97.7 F, HR 116, RR 20, BP 120/87, SpO2 79% 4 L nasal cannula.  WBC 17.1, hemoglobin 13.5, platelets 194.  BNP 49.0, high sensitive troponin 11.  Sodium 132, potassium 4.9, chloride 85, CO2 33, glucose 111, BUN 61, creatinine 4.20.  Urinalysis unrevealing.  Chest x-ray with chronic obstructive pulmonary disease, no evidence of acute cardiopulmonary disease process, no consolidations noted.  Renal ultrasound with extrarenal pelvis noted to each kidney, no separate collecting system dilation, parapelvic right-sided renal cyst.  CT renal stone study with interval development of mild bilateral renal collecting system dilation, right greater than left down to the bladder, calcifications along the bladder on the right side near the margin of the right UVJ, otherwise no ureteral stones, punctate nonobstructing right renal stone, Foley catheter noted in bladder with contracted bladder, increasing retroperitoneal stranding diffusely, heterogeneous prostate with a large appearance no bowel obstruction, no free air, normal-appearing appendix, small hiatal hernia.  Foley catheter was placed in the ED.  Patient received DuoNeb.  TRH consulted for admission for further evaluation management of obstructive uropathy, acute renal failure, acute on chronic respite failure secondary to COPD exacerbation.  Assessment & Plan:   Acute on chronic respiratory failure with hypoxia COPD exacerbation Patient presenting to ED with progressive shortness of breath.  At baseline  oxygen dependent with 4 L per nasal cannula.  SpO2 79% on admission.  Chest x-ray with no focal consolidation. -- Pulmonology following, appreciate assistance -- Azithromycin 250 mg p.o. daily x 5 days -- Solu-Medrol 40 mg IV  q12h (prednisone taper closer to discharge; decrease by 10 mg every 3-4 days, plan 2-week taper) -- DuoNebs every 6 hours -- Pulmicort neb twice daily -- Mucinex 600 mg p.o. twice daily PRN -- Continue supplemental oxygen, maintain SpO2 greater than 88% -- Flutter valve, incentive spirometry -- Ambulatory O2 screen today shows no needs 6 L while ambulating, DME orders updated to reflect  Acute renal failure Obstructive uropathy BPH Follows with urology outpatient, Dr. Tresa Moore.  Creatinine elevated 4.2 on admission.  Foley catheter was placed in the ED.  Urology was consulted and recommended continue Foley catheter and outpatient follow-up in the clinic for voiding trial. -- Cr 4.2>>0.78 -- Continue Foley catheter -- Tamsulosin 0.4 mg p.o. daily -- Monitor urine output  Prostate cancer --Continue outpatient follow-up with urology  Weakness/debility/deconditioning/gait disturbance: PT/OT initially recommending SNF.  Per social work, unable to accommodate given his previous legal troubles. -- Continue therapy efforts while inpatient -- Anticipate discharge home with home health services, home health orders placed  DVT prophylaxis: heparin injection 5,000 Units Start: 08/05/22 2200    Code Status: Full Code Family Communication: No family present at bedside this morning  Disposition Plan:  Level of care: Telemetry Status is: Inpatient Remains inpatient appropriate because: IV steroids, anticipate discharge home with home health services in 2-3 days    Consultants:  Urology, Dr. Junious Silk Pulmonology  Procedures:  Foley catheter placement 3/29  Antimicrobials:  Azithromycin   Subjective: Patient seen examined bedside, resting.  Lying  in bed.  Eating  breakfast.  Endorses dyspnea improved today.  Remains on 5 L nasal cannula at rest which is slightly above his typical baseline.  Ambulatory O2 screen notable for need of increased oxygen to 6 L per nasal cannula.  Social worker requesting updated DME orders to reflect increased oxygen needs.  Also social work states he is unable to go to SNF due to previous legal trouble and anticipate discharge home with home health services.  Home health orders placed.  Patient with no other questions or concerns at this time.   Denies headache, no dizziness, no chest pain, no palpitations, no abdominal pain, no fever/chills/night sweats, no nausea/vomiting/diarrhea.  No acute events overnight per nursing staff.  Objective: Vitals:   08/08/22 0751 08/08/22 1130 08/08/22 1214 08/08/22 1516  BP:    (!) 141/76  Pulse:    91  Resp:    18  Temp:    97.9 F (36.6 C)  TempSrc:    Oral  SpO2: 91% 94% 90% 92%    Intake/Output Summary (Last 24 hours) at 08/08/2022 1538 Last data filed at 08/08/2022 1300 Gross per 24 hour  Intake 480 ml  Output 1550 ml  Net -1070 ml   There were no vitals filed for this visit.  Examination:  Physical Exam: GEN: NAD, alert and oriented x 3, chronically ill appearance, appears older than stated age HEENT: NCAT, PERRL, EOMI, sclera clear, MMM PULM: Breath sounds bilateral bases, mid to late expiratory wheezing throughout all lung fields, no crackles, normal respiratory effort without accessory muscle use, on 5 L nasal cannula with SpO2 96% at rest. CV: RRR w/o M/G/R GI: abd soft, NTND, NABS, no R/G/M MSK: no peripheral edema, moves all EXTR independently NEURO: No focal neurological deficits appreciated PSYCH: normal mood/affect Integumentary: dry/intact, no rashes or wounds    Data Reviewed: I have personally reviewed following labs and imaging studies  CBC: Recent Labs  Lab 08/04/22 1353 08/04/22 1911 08/05/22 0539 08/06/22 0622 08/07/22 0810  WBC 17.1* 13.5* 10.7*  11.8* 9.7  NEUTROABS 14.6* 13.1*  --  10.3*  --   HGB 13.5 12.9* 10.8* 10.9* 11.9*  HCT 42.3 40.6 35.3* 35.1* 39.1  MCV 89.2 91.2 94.1 94.1 94.4  PLT 194 187 162 169 [id number]   Basic Metabolic Panel: Recent Labs  Lab 08/04/22 1353 08/04/22 1911 08/05/22 0539 08/06/22 0622  NA 132* 137 140 140  K 4.9 4.3 4.7 4.2  CL 85* 96* 100 96*  CO2 33* 31 30 35*  GLUCOSE 111* 121* 92 124*  BUN 61* 51* 40* 32*  CREATININE 4.20* 2.54* 1.23 0.78  CALCIUM 9.6 8.9 8.4* 8.8*  MG  --  2.9*  --  2.1  PHOS  --  5.2*  --  2.8   GFR: CrCl cannot be calculated (Unknown ideal weight.). Liver Function Tests: Recent Labs  Lab 08/04/22 1911 08/06/22 0622  AST 27  --   ALT 15  --   ALKPHOS 61  --   BILITOT 0.7  --   PROT 6.9  --   ALBUMIN 3.5 3.0*   No results for input(s): "LIPASE", "AMYLASE" in the last 168 hours. No results for input(s): "AMMONIA" in the last 168 hours. Coagulation Profile: No results for input(s): "INR", "PROTIME" in the last 168 hours. Cardiac Enzymes: No results for input(s): "CKTOTAL", "CKMB", "CKMBINDEX", "TROPONINI" in the last 168 hours. BNP (last 3 results) No results for input(s): "PROBNP" in the last 8760 hours. HbA1C: No results for input(s): "  HGBA1C" in the last 72 hours. CBG: No results for input(s): "GLUCAP" in the last 168 hours. Lipid Profile: No results for input(s): "CHOL", "HDL", "LDLCALC", "TRIG", "CHOLHDL", "LDLDIRECT" in the last 72 hours. Thyroid Function Tests: No results for input(s): "TSH", "T4TOTAL", "FREET4", "T3FREE", "THYROIDAB" in the last 72 hours. Anemia Panel: No results for input(s): "VITAMINB12", "FOLATE", "FERRITIN", "TIBC", "IRON", "RETICCTPCT" in the last 72 hours. Sepsis Labs: No results for input(s): "PROCALCITON", "LATICACIDVEN" in the last 168 hours.  Recent Results (from the past 240 hour(s))  Respiratory (~20 pathogens) panel by PCR     Status: None   Collection Time: 08/04/22  6:56 PM   Specimen: Nasopharyngeal Swab;  Respiratory  Result Value Ref Range Status   Adenovirus NOT DETECTED NOT DETECTED Final   Coronavirus 229E NOT DETECTED NOT DETECTED Final    Comment: (NOTE) The Coronavirus on the Respiratory Panel, DOES NOT test for the novel  Coronavirus (2019 nCoV)    Coronavirus HKU1 NOT DETECTED NOT DETECTED Final   Coronavirus NL63 NOT DETECTED NOT DETECTED Final   Coronavirus OC43 NOT DETECTED NOT DETECTED Final   Metapneumovirus NOT DETECTED NOT DETECTED Final   Rhinovirus / Enterovirus NOT DETECTED NOT DETECTED Final   Influenza A NOT DETECTED NOT DETECTED Final   Influenza B NOT DETECTED NOT DETECTED Final   Parainfluenza Virus 1 NOT DETECTED NOT DETECTED Final   Parainfluenza Virus 2 NOT DETECTED NOT DETECTED Final   Parainfluenza Virus 3 NOT DETECTED NOT DETECTED Final   Parainfluenza Virus 4 NOT DETECTED NOT DETECTED Final   Respiratory Syncytial Virus NOT DETECTED NOT DETECTED Final   Bordetella pertussis NOT DETECTED NOT DETECTED Final   Bordetella Parapertussis NOT DETECTED NOT DETECTED Final   Chlamydophila pneumoniae NOT DETECTED NOT DETECTED Final   Mycoplasma pneumoniae NOT DETECTED NOT DETECTED Final    Comment: Performed at [facility], [place]. [address], [place], [place] [address]  Resp panel by RT-PCR (RSV, Flu A&B, Covid) Anterior Nasal Swab     Status: None   Collection Time: 08/04/22  6:56 PM   Specimen: Anterior Nasal Swab  Result Value Ref Range Status   SARS Coronavirus 2 by RT PCR NEGATIVE NEGATIVE Final    Comment: (NOTE) SARS-CoV-2 target nucleic acids are NOT DETECTED.  The SARS-CoV-2 RNA is generally detectable in upper respiratory specimens during the acute phase of infection. The lowest concentration of SARS-CoV-2 viral copies this assay can detect is 138 copies/mL. A negative result does not preclude SARS-Cov-2 infection and should not be used as the sole basis for treatment or other patient management decisions. A negative result may occur with   improper specimen collection/handling, submission of specimen other than nasopharyngeal swab, presence of viral mutation(s) within the areas targeted by this assay, and inadequate number of viral copies(<138 copies/mL). A negative result must be combined with clinical observations, patient history, and epidemiological information. The expected result is Negative.  Fact Sheet for Patients:  EntrepreneurPulse.com.au  Fact Sheet for Healthcare Providers:  IncredibleEmployment.be  This test is no t yet approved or cleared by the [place] FDA and  has been authorized for detection and/or diagnosis of SARS-CoV-2 by FDA under an Emergency Use Authorization (EUA). This EUA will remain  in effect (meaning this test can be used) for the duration of the COVID-19 declaration under Section 564(b)(1) of the Act, 21 U.S.C.section 360bbb-3(b)(1), unless the authorization is terminated  or revoked sooner.       Influenza A by PCR NEGATIVE NEGATIVE Final  Influenza B by PCR NEGATIVE NEGATIVE Final    Comment: (NOTE) The Xpert Xpress SARS-CoV-2/FLU/RSV plus assay is intended as an aid in the diagnosis of influenza from Nasopharyngeal swab specimens and should not be used as a sole basis for treatment. Nasal washings and aspirates are unacceptable for Xpert Xpress SARS-CoV-2/FLU/RSV testing.  Fact Sheet for Patients: EntrepreneurPulse.com.au  Fact Sheet for Healthcare Providers: IncredibleEmployment.be  This test is not yet approved or cleared by the [place] FDA and has been authorized for detection and/or diagnosis of SARS-CoV-2 by FDA under an Emergency Use Authorization (EUA). This EUA will remain in effect (meaning this test can be used) for the duration of the COVID-19 declaration under Section 564(b)(1) of the Act, 21 U.S.C. section 360bbb-3(b)(1), unless the authorization is terminated or revoked.      Resp Syncytial Virus by PCR NEGATIVE NEGATIVE Final    Comment: (NOTE) Fact Sheet for Patients: EntrepreneurPulse.com.au  Fact Sheet for Healthcare Providers: IncredibleEmployment.be  This test is not yet approved or cleared by the [place] FDA and has been authorized for detection and/or diagnosis of SARS-CoV-2 by FDA under an Emergency Use Authorization (EUA). This EUA will remain in effect (meaning this test can be used) for the duration of the COVID-19 declaration under Section 564(b)(1) of the Act, 21 U.S.C. section 360bbb-3(b)(1), unless the authorization is terminated or revoked.  Performed at [facility], [place] [address], [place], [place] [address]          Radiology Studies: No results found.      Scheduled Meds:  azithromycin  250 mg Oral Daily   budesonide (PULMICORT) nebulizer solution  0.25 mg Nebulization BID   Chlorhexidine Gluconate Cloth  6 each Topical Daily   fluticasone  2 spray Each Nare BID   heparin injection (subcutaneous)  5,000 Units Subcutaneous Q8H   ipratropium-albuterol  3 mL Nebulization QID   loratadine  10 mg Oral Daily   methylPREDNISolone (SOLU-MEDROL) injection  40 mg Intravenous Q12H   tamsulosin  0.4 mg Oral Daily   Continuous Infusions:   LOS: 3 days    Time spent: 52 minutes spent on chart review, discussion with nursing staff, consultants, updating family and interview/physical exam; more than 50% of that time was spent in counseling and/or coordination of care.    Zach Tietje J [place] DO [facility] Available via Epic secure chat 7am-7pm After these hours, please refer to coverage provider listed on [email or web address] 08/08/2022, 3:38 PM

## 2022-08-08 NOTE — TOC Progression Note (Signed)
Transition of Care [facility]) - Progression Note    Patient Details  Name: Frank [name] MRN: [id number] Date of Birth: [birth year]  Transition of Care [facility]) CM/SW [place], LCSW Phone Number: 08/08/2022, 10:30 AM  Clinical Narrative:    Spoke with pt's sister via t/c to review bed offers. Pt's sister accepted bed at [facility]. Insurance Josem Kaufmann has been requested and currently awaiting approval.    Expected Discharge Plan: Skilled Nursing Facility Barriers to Discharge: Continued Medical Work up  Expected Discharge Plan and Services In-house Referral: Clinical Social Work   Post Acute Care Choice: [place] Living arrangements for the past 2 months: Single Family Home                 DME Arranged: N/A DME Agency: NA                   Social Determinants of Health (SDOH) Interventions SDOH Screenings   Food Insecurity: No Food Insecurity (07/17/2022)  Housing: Low Risk  (07/17/2022)  Transportation Needs: Unmet Transportation Needs (07/17/2022)  Utilities: Not At Risk (07/17/2022)  Alcohol Screen: Low Risk  (07/17/2022)  Depression (PHQ2-9): Low Risk  (07/17/2022)  Financial Resource Strain: Low Risk  (07/17/2022)  Physical Activity: Inactive (07/17/2022)  Social Connections: Socially Isolated (07/17/2022)  Stress: No Stress Concern Present (07/17/2022)  Tobacco Use: Medium Risk (08/04/2022)    Readmission Risk Interventions    08/08/2022   10:30 AM  Readmission Risk Prevention Plan  Transportation Screening Complete  PCP or Specialist Appt within 5-7 Days Complete  Home Care Screening Complete  Medication Review (RN CM) Complete

## 2022-08-08 NOTE — TOC Progression Note (Signed)
Transition of Care [facility]) - Progression Note    Patient Details  Name: Frank [name] MRN: [id number] Date of Birth: 1955/06/08  Transition of Care [facility]) CM/SW [place], LCSW Phone Number: 08/08/2022, 1:39 PM  Clinical Narrative:    Per Helene Kelp pt in on sex offender registry and they are no longer able to accept.  Due to pt's hx and status on the sex offender registry pt will not be eligible for SNF placement.  CSW met with pt and sister in room to discuss. Pt understands he will not qualify for SNF. Pt is agreeable to having Holly arranged.  Pt shares he currently lives at home alone and has the following DME: Hospital bed, shower chair, walker, cane, wheelchair, and 4L home O2 through [employer]. Contacted [employer] to increase pt's home O2 to 6L.  HHPT/OT/RN/Aide has been arranged with Alvis Lemmings.   Expected Discharge Plan: [place] Barriers to Discharge: Continued Medical Work up  Expected Discharge Plan and Services In-house Referral: Clinical Social Work   Post Acute Care Choice: [place] Living arrangements for the past 2 months: Single Family Home                 DME Arranged: N/A DME Agency: NA       HH Arranged: PT, OT, RN, Nurse's Aide [place] Agency: [place] Date [facility] Agency Contacted: 08/08/22 Time [place]: [id number] Representative spoke with at [place]: [place] Interventions SDOH Screenings   Food Insecurity: No Food Insecurity (07/17/2022)  Housing: Low Risk  (07/17/2022)  Transportation Needs: Unmet Transportation Needs (07/17/2022)  Utilities: Not At Risk (07/17/2022)  Alcohol Screen: Low Risk  (07/17/2022)  Depression (PHQ2-9): Low Risk  (07/17/2022)  Financial Resource Strain: Low Risk  (07/17/2022)  Physical Activity: Inactive (07/17/2022)  Social Connections: Socially Isolated (07/17/2022)  Stress: No Stress Concern Present (07/17/2022)  Tobacco Use: Medium Risk  (08/04/2022)    Readmission Risk Interventions    08/08/2022   10:30 AM  Readmission Risk Prevention Plan  Transportation Screening Complete  PCP or Specialist Appt within 5-7 Days Complete  Home Care Screening Complete  Medication Review (RN CM) Complete

## 2022-08-09 DIAGNOSIS — J441 Chronic obstructive pulmonary disease with (acute) exacerbation: Secondary | ICD-10-CM | POA: Diagnosis not present

## 2022-08-09 LAB — BASIC METABOLIC PANEL
Anion gap: 8 (ref 5–15)
BUN: 28 mg/dL — ABNORMAL HIGH (ref 8–23)
CO2: 40 mmol/L — ABNORMAL HIGH (ref 22–32)
Calcium: 9 mg/dL (ref 8.9–10.3)
Chloride: 93 mmol/L — ABNORMAL LOW (ref 98–111)
Creatinine, Ser: 0.7 mg/dL (ref 0.61–1.24)
GFR, Estimated: >60 mL/min (ref >60)
Glucose, Bld: 111 mg/dL — ABNORMAL HIGH (ref 70–99)
Potassium: 4.6 mmol/L (ref 3.5–5.1)
Sodium: 141 mmol/L (ref 135–145)

## 2022-08-09 LAB — CBC
HCT: 40.4 % (ref 39.0–52.0)
Hemoglobin: 12.2 g/dL — ABNORMAL LOW (ref 13.0–17.0)
MCH: 28.1 pg (ref 26.0–34.0)
MCHC: 30.2 g/dL (ref 30.0–36.0)
MCV: 93.1 fL (ref 80.0–100.0)
Platelets: 215 10*3/uL (ref 150–400)
RBC: 4.34 MIL/uL (ref 4.22–5.81)
RDW: 12 % (ref 11.5–15.5)
WBC: 8.3 10*3/uL (ref 4.0–10.5)
nRBC: 0 % (ref 0.0–0.2)

## 2022-08-09 LAB — MAGNESIUM: Magnesium: 2.2 mg/dL (ref 1.7–2.4)

## 2022-08-09 MED ORDER — GUAIFENESIN ER 600 MG PO TB12
600.0000 mg | ORAL_TABLET | Freq: Two times a day (BID) | ORAL | Status: DC
  Administered 2022-08-09 – 2022-08-11 (×5): 600 mg via ORAL
  Filled 2022-08-09 (×5): qty 1

## 2022-08-09 MED ORDER — IPRATROPIUM-ALBUTEROL 0.5-2.5 (3) MG/3ML IN SOLN
3.0000 mL | Freq: Four times a day (QID) | RESPIRATORY_TRACT | Status: DC
  Administered 2022-08-09 – 2022-08-11 (×8): 3 mL via RESPIRATORY_TRACT
  Filled 2022-08-09 (×8): qty 3

## 2022-08-09 MED ORDER — ARFORMOTEROL TARTRATE 15 MCG/2ML IN NEBU
15.0000 ug | INHALATION_SOLUTION | Freq: Two times a day (BID) | RESPIRATORY_TRACT | Status: DC
  Administered 2022-08-09 – 2022-08-11 (×4): 15 ug via RESPIRATORY_TRACT
  Filled 2022-08-09 (×4): qty 2

## 2022-08-09 MED ORDER — ARFORMOTEROL TARTRATE 15 MCG/2ML IN NEBU: 15.0000 ug | INHALATION_SOLUTION | Freq: Two times a day (BID) | RESPIRATORY_TRACT | Status: DC

## 2022-08-09 NOTE — Progress Notes (Signed)
Physical Therapy Treatment Patient Details Name: Frank [name] MRN: [id number] DOB: 09/13/1955 Today's Date: 08/09/2022   History of Present Illness Patient is a 67 year old male who presented with SOB,and inability to pass urine.  Patient was admitted with COPD exacerbation, acute on chronic respiratory failure, AKI, and urinary retention. PMH: prostate cancer, BPH, UTI symptoms, cancer, nephrolithiasis.    PT Comments     Patient  resting in bed on 6 L PM HFNC , SPO2  93%. Decreased to 4 LPM with drop to 87%. Placed back on 6 LPM for ambulating x 40', SPo2 dropped to 78%. Seated rest  with return to 90%, Ambulated another  46' with drop to 84%. HR  max 114.    Patient continues to demonstrate  high O2 needs during activity- ^ L not keeping sats up  Recommendations for follow up therapy are one component of a multi-disciplinary discharge planning process, led by the attending physician.  Recommendations may be updated based on patient status, additional functional criteria and insurance authorization.  Follow Up Recommendations  Can patient physically be transported by private vehicle: Yes    Assistance Recommended at Discharge PRN  Patient can return home with the following Assistance with cooking/housework;Help with stairs or ramp for entrance   Equipment Recommendations  Rollator (4 wheels)    Recommendations for Other Services       Precautions / Restrictions Precautions Precautions: Fall Precaution Comments: monitor O2, foley     Mobility  Bed Mobility         Supine to sit: Independent     General bed mobility comments: increased time and use of rail.    Transfers Overall transfer level: Needs assistance Equipment used: Rolling walker (2 wheels) Transfers: Sit to/from Stand Sit to Stand: Supervision                Ambulation/Gait Ambulation/Gait assistance: Min guard Gait Distance (Feet): 40 Feet (x 2) Assistive device: Rolling walker (2  wheels) Gait Pattern/deviations: Step-through pattern, Decreased stride length Gait velocity: decreased     General Gait Details: SPO2 dropped to  78-80 on 6 LPM, recovered after 2 minutes seated rest, again dropped to 80   Stairs             Wheelchair Mobility    Modified Rankin (Stroke Patients Only)       Balance Overall balance assessment: Needs assistance   Sitting balance-Leahy Scale: Good     Standing balance support: No upper extremity supported Standing balance-Leahy Scale: Fair Standing balance comment: static fair                            Cognition Arousal/Alertness: Awake/alert Behavior During Therapy: WFL for tasks assessed/performed                                   General Comments: AxO x 4 very pleasant and willing.  Admits to "getting tired" and "I can't do much".        Exercises      General Comments        Pertinent Vitals/Pain Pain Assessment Pain Assessment: No/denies pain    Home Living                          Prior Function  PT Goals (current goals can now be found in the care plan section) Progress towards PT goals: Progressing toward goals    Frequency    Min 3X/week      PT Plan Current plan remains appropriate    Co-evaluation              AM-PAC PT "6 Clicks" Mobility   Outcome Measure  Help needed turning from your back to your side while in a flat bed without using bedrails?: None Help needed moving from lying on your back to sitting on the side of a flat bed without using bedrails?: None Help needed moving to and from a bed to a chair (including a wheelchair)?: A Little Help needed standing up from a chair using your arms (e.g., wheelchair or bedside chair)?: A Little Help needed to walk in hospital room?: A Little Help needed climbing 3-5 steps with a railing? : A Little 6 Click Score: 20    End of Session   Activity Tolerance: Patient  tolerated treatment well;Treatment limited secondary to medical complications (Comment) (SOB) Patient left: in chair;with call bell/phone within reach;with chair alarm set Nurse Communication: Mobility status PT Visit Diagnosis: Difficulty in walking, not elsewhere classified (R26.2)     Time: [id number] PT Time Calculation (min) (ACUTE ONLY): 24 min  Charges:  $Gait Training: 23-37 mins                     [place] Office [phone] Weekend [phone]    Claretha Cooper 08/09/2022, 2:19 PM

## 2022-08-09 NOTE — Progress Notes (Signed)
PROGRESS NOTE    Frank [name]  [id number] DOB: [birth year] DOA: 08/04/2022 PCP: Serita Butcher, MD   Brief Narrative: 67 year old with past medical history significant for chronic respiratory failure, severe emphysematous COPD, on 4 L of oxygen at home, prostate cancer, BPH presents 3/29 with inability to urinate over the last 3 days and progressive shortness of breath.  Presented hypoxic with oxygen saturations 79 on 4 L, leukocytosis white blood cell 17.  In AKI with a creatinine of 4.2.  Patient was admitted for obstructive uropathy, acute renal failure, acute on chronic respiratory failure secondary to COPD exacerbation.    Assessment & Plan:   Principal Problem:   COPD exacerbation Active Problems:   Acute on chronic respiratory failure   Pressure injury of skin   1-Acute on chronic respiratory failure with hypoxia Acute COPD exacerbation: -Continue with IV Solu-Medrol -Continue with Pulmicort, DuoNeb -Still requiring 6 L oxygen.  -Will add brovana, schedule Guaifenesin.   2-Acute renal failure secondary to obstructive uropathy BPH -Presented with a creatinine of 4.2.  Foley catheter placed in the ED.  Urology was consulted and recommended continue Foley catheter and outpatient follow-up in the clinic for voiding trial. -CT: Mild bilateral renal collecting system dilation, punctuate nonobstructing right-sided renal stone.  Increasing retroperitoneal stranding diffusely. -Renal ultrasound: Extrarenal pelvis noted to each kidney.  No separate collecting system dilation.  Parapelvic right-sided renal cyst.  -Creatinine has decreased from 4.2-0.7 -Continue with Foley catheter care -Continue with Flomax -Resolved.   3-History of prostate cancer: Follow-up with urology as an outpatient CT: Heterogeneous prostate with enlarged appearance of the left seminal vesicle. Please correlate for known history of prostate neoplasm. In addition there is increasing extent and  distribution of sclerotic bone metastases. A follow up bone scan could be considered when clinically appropriate. -Needs Follow up with urology   4-Weakness, Debility, Gait Disturbance.  Needs Home health PT     Pressure Injury 08/04/22 Buttocks Right;Left Stage 1 -  Intact skin with non-blanchable redness of a localized area usually over a bony prominence. (Active)  08/04/22 1840  Location: Buttocks  Location Orientation: Right;Left  Staging: Stage 1 -  Intact skin with non-blanchable redness of a localized area usually over a bony prominence.  Wound Description (Comments):   Present on Admission:   Dressing Type Foam - Lift dressing to assess site every shift 08/08/22 1901     Estimated body mass index is 22.65 kg/m as calculated from the following:   Height as of 01/11/22: 5\' 6"  (1.676 m).   Weight as of 07/17/22: 63.6 kg.   DVT prophylaxis: Heparin  Code Status: Full code Family Communication: Care discussed with patient.  Disposition Plan:  Status is: Inpatient Remains inpatient appropriate because: management of COPD, hypoxia.     Consultants:  Urology, Dr. Junious Silk -Pulmonology  Procedures:  Foley catheter placement 3/29  Antimicrobials:  Azithromycin.   Subjective: He is breathing ok, report cough. He is still requiring 6 L oxygen. He uses 4 L at home   Objective: Vitals:   08/08/22 1915 08/08/22 1929 08/08/22 1932 08/09/22 0517  BP: 135/76   131/71  Pulse: 99   84  Resp: 19   18  Temp: 98 F (36.7 C)   97.9 F (36.6 C)  TempSrc: Oral   Oral  SpO2: 94% 94% 94% 94%    Intake/Output Summary (Last 24 hours) at 08/09/2022 [id number] Last data filed at 08/09/2022 0500 Gross per 24 hour  Intake 240 ml  Output 1550  ml  Net -1310 ml   There were no vitals filed for this visit.  Examination:  General exam: Appears calm and comfortable  Respiratory system: BL ronchus.  Cardiovascular system: S1 & S2 heard, RRR.  Gastrointestinal system: Abdomen is  nondistended, soft and nontender. No organomegaly or masses felt. Normal bowel sounds heard. Central nervous system: Alert and oriented. No focal neurological deficits. Extremities: Symmetric 5 x 5 power.    Data Reviewed: I have personally reviewed following labs and imaging studies  CBC: Recent Labs  Lab 08/04/22 1353 08/04/22 1911 08/05/22 0539 08/06/22 0622 08/07/22 0810 08/09/22 0601  WBC 17.1* 13.5* 10.7* 11.8* 9.7 8.3  NEUTROABS 14.6* 13.1*  --  10.3*  --   --   HGB 13.5 12.9* 10.8* 10.9* 11.9* 12.2*  HCT 42.3 40.6 35.3* 35.1* 39.1 40.4  MCV 89.2 91.2 94.1 94.1 94.4 93.1  PLT 194 187 162 169 169 [id number]   Basic Metabolic Panel: Recent Labs  Lab 08/04/22 1353 08/04/22 1911 08/05/22 0539 08/06/22 0622 08/09/22 0601  NA 132* 137 140 140 141  K 4.9 4.3 4.7 4.2 4.6  CL 85* 96* 100 96* 93*  CO2 33* 31 30 35* 40*  GLUCOSE 111* 121* 92 124* 111*  BUN 61* 51* 40* 32* 28*  CREATININE 4.20* 2.54* 1.23 0.78 0.70  CALCIUM 9.6 8.9 8.4* 8.8* 9.0  MG  --  2.9*  --  2.1 2.2  PHOS  --  5.2*  --  2.8  --    GFR: CrCl cannot be calculated (Unknown ideal weight.). Liver Function Tests: Recent Labs  Lab 08/04/22 1911 08/06/22 0622  AST 27  --   ALT 15  --   ALKPHOS 61  --   BILITOT 0.7  --   PROT 6.9  --   ALBUMIN 3.5 3.0*   No results for input(s): "LIPASE", "AMYLASE" in the last 168 hours. No results for input(s): "AMMONIA" in the last 168 hours. Coagulation Profile: No results for input(s): "INR", "PROTIME" in the last 168 hours. Cardiac Enzymes: No results for input(s): "CKTOTAL", "CKMB", "CKMBINDEX", "TROPONINI" in the last 168 hours. BNP (last 3 results) No results for input(s): "PROBNP" in the last 8760 hours. HbA1C: No results for input(s): "HGBA1C" in the last 72 hours. CBG: No results for input(s): "GLUCAP" in the last 168 hours. Lipid Profile: No results for input(s): "CHOL", "HDL", "LDLCALC", "TRIG", "CHOLHDL", "LDLDIRECT" in the last 72 hours. Thyroid  Function Tests: No results for input(s): "TSH", "T4TOTAL", "FREET4", "T3FREE", "THYROIDAB" in the last 72 hours. Anemia Panel: No results for input(s): "VITAMINB12", "FOLATE", "FERRITIN", "TIBC", "IRON", "RETICCTPCT" in the last 72 hours. Sepsis Labs: No results for input(s): "PROCALCITON", "LATICACIDVEN" in the last 168 hours.  Recent Results (from the past 240 hour(s))  Respiratory (~20 pathogens) panel by PCR     Status: None   Collection Time: 08/04/22  6:56 PM   Specimen: Nasopharyngeal Swab; Respiratory  Result Value Ref Range Status   Adenovirus NOT DETECTED NOT DETECTED Final   Coronavirus 229E NOT DETECTED NOT DETECTED Final    Comment: (NOTE) The Coronavirus on the Respiratory Panel, DOES NOT test for the novel  Coronavirus (2019 nCoV)    Coronavirus HKU1 NOT DETECTED NOT DETECTED Final   Coronavirus NL63 NOT DETECTED NOT DETECTED Final   Coronavirus OC43 NOT DETECTED NOT DETECTED Final   Metapneumovirus NOT DETECTED NOT DETECTED Final   Rhinovirus / Enterovirus NOT DETECTED NOT DETECTED Final   Influenza A NOT DETECTED NOT DETECTED Final  Influenza B NOT DETECTED NOT DETECTED Final   Parainfluenza Virus 1 NOT DETECTED NOT DETECTED Final   Parainfluenza Virus 2 NOT DETECTED NOT DETECTED Final   Parainfluenza Virus 3 NOT DETECTED NOT DETECTED Final   Parainfluenza Virus 4 NOT DETECTED NOT DETECTED Final   Respiratory Syncytial Virus NOT DETECTED NOT DETECTED Final   Bordetella pertussis NOT DETECTED NOT DETECTED Final   Bordetella Parapertussis NOT DETECTED NOT DETECTED Final   Chlamydophila pneumoniae NOT DETECTED NOT DETECTED Final   Mycoplasma pneumoniae NOT DETECTED NOT DETECTED Final    Comment: Performed at [facility], [place] [address], [place], [place] [address]  Resp panel by RT-PCR (RSV, Flu A&B, Covid) Anterior Nasal Swab     Status: None   Collection Time: 08/04/22  6:56 PM   Specimen: Anterior Nasal Swab  Result Value Ref Range Status   SARS  Coronavirus 2 by RT PCR NEGATIVE NEGATIVE Final    Comment: (NOTE) SARS-CoV-2 target nucleic acids are NOT DETECTED.  The SARS-CoV-2 RNA is generally detectable in upper respiratory specimens during the acute phase of infection. The lowest concentration of SARS-CoV-2 viral copies this assay can detect is 138 copies/mL. A negative result does not preclude SARS-Cov-2 infection and should not be used as the sole basis for treatment or other patient management decisions. A negative result may occur with  improper specimen collection/handling, submission of specimen other than nasopharyngeal swab, presence of viral mutation(s) within the areas targeted by this assay, and inadequate number of viral copies(<138 copies/mL). A negative result must be combined with clinical observations, patient history, and epidemiological information. The expected result is Negative.  Fact Sheet for Patients:  EntrepreneurPulse.com.au  Fact Sheet for Healthcare Providers:  IncredibleEmployment.be  This test is no t yet approved or cleared by the [place] FDA and  has been authorized for detection and/or diagnosis of SARS-CoV-2 by FDA under an Emergency Use Authorization (EUA). This EUA will remain  in effect (meaning this test can be used) for the duration of the COVID-19 declaration under Section 564(b)(1) of the Act, 21 U.S.C.section 360bbb-3(b)(1), unless the authorization is terminated  or revoked sooner.       Influenza A by PCR NEGATIVE NEGATIVE Final   Influenza B by PCR NEGATIVE NEGATIVE Final    Comment: (NOTE) The Xpert Xpress SARS-CoV-2/FLU/RSV plus assay is intended as an aid in the diagnosis of influenza from Nasopharyngeal swab specimens and should not be used as a sole basis for treatment. Nasal washings and aspirates are unacceptable for Xpert Xpress SARS-CoV-2/FLU/RSV testing.  Fact Sheet for  Patients: EntrepreneurPulse.com.au  Fact Sheet for Healthcare Providers: IncredibleEmployment.be  This test is not yet approved or cleared by the [place] FDA and has been authorized for detection and/or diagnosis of SARS-CoV-2 by FDA under an Emergency Use Authorization (EUA). This EUA will remain in effect (meaning this test can be used) for the duration of the COVID-19 declaration under Section 564(b)(1) of the Act, 21 U.S.C. section 360bbb-3(b)(1), unless the authorization is terminated or revoked.     Resp Syncytial Virus by PCR NEGATIVE NEGATIVE Final    Comment: (NOTE) Fact Sheet for Patients: EntrepreneurPulse.com.au  Fact Sheet for Healthcare Providers: IncredibleEmployment.be  This test is not yet approved or cleared by the [place] FDA and has been authorized for detection and/or diagnosis of SARS-CoV-2 by FDA under an Emergency Use Authorization (EUA). This EUA will remain in effect (meaning this test can be used) for the duration of the COVID-19 declaration under  Section 564(b)(1) of the Act, 21 U.S.C. section 360bbb-3(b)(1), unless the authorization is terminated or revoked.  Performed at [facility], [place] [address], [place], [place] [address]          Radiology Studies: No results found.      Scheduled Meds:  azithromycin  250 mg Oral Daily   budesonide (PULMICORT) nebulizer solution  0.25 mg Nebulization BID   Chlorhexidine Gluconate Cloth  6 each Topical Daily   fluticasone  2 spray Each Nare BID   heparin injection (subcutaneous)  5,000 Units Subcutaneous Q8H   ipratropium-albuterol  3 mL Nebulization QID   loratadine  10 mg Oral Daily   methylPREDNISolone (SOLU-MEDROL) injection  40 mg Intravenous Q12H   tamsulosin  0.4 mg Oral Daily   Continuous Infusions:   LOS: 4 days    Time spent: 35 miutes    Chontel Warning A Zacharias Ridling, MD [facility]   If 7PM-7AM, please contact night-coverage [email or web address]  08/09/2022, 7:12 AM

## 2022-08-10 DIAGNOSIS — J441 Chronic obstructive pulmonary disease with (acute) exacerbation: Secondary | ICD-10-CM | POA: Diagnosis not present

## 2022-08-10 NOTE — Progress Notes (Signed)
PROGRESS NOTE    Frank [name]  [id number] DOB: [birth year] DOA: 08/04/2022 PCP: Serita Butcher, MD   Brief Narrative: 67 year old with past medical history significant for chronic respiratory failure, severe emphysematous COPD, on 4 L of oxygen at home, prostate cancer, BPH presents 3/29 with inability to urinate over the last 3 days and progressive shortness of breath.  Presented hypoxic with oxygen saturations 79 on 4 L, leukocytosis white blood cell 17.  In AKI with a creatinine of 4.2.  Patient was admitted for obstructive uropathy, acute renal failure, acute on chronic respiratory failure secondary to COPD exacerbation.    Assessment & Plan:   Principal Problem:   COPD exacerbation Active Problems:   Acute on chronic respiratory failure   Pressure injury of skin   1-Acute on chronic respiratory failure with hypoxia Acute COPD exacerbation: -Continue with IV Solu-Medrol -Continue with Pulmicort, DuoNeb -Oxygen requirement down to 5 L.  -Continue with brovana, schedule Guaifenesin.  Hopefully home tomorrow.   2-Acute renal failure secondary to obstructive uropathy BPH -Presented with a creatinine of 4.2.  Foley catheter placed in the ED.  Urology was consulted and recommended continue Foley catheter and outpatient follow-up in the clinic for voiding trial. -CT: Mild bilateral renal collecting system dilation, punctuate nonobstructing right-sided renal stone.  Increasing retroperitoneal stranding diffusely. -Renal ultrasound: Extrarenal pelvis noted to each kidney.  No separate collecting system dilation.  Parapelvic right-sided renal cyst.  -Creatinine has decreased from 4.2-0.7 -Continue with Foley catheter care -Continue with Flomax -Resolved.   3-History of prostate cancer: Follow-up with urology as an outpatient CT: Heterogeneous prostate with enlarged appearance of the left seminal vesicle. Please correlate for known history of prostate neoplasm. In addition  there is increasing extent and distribution of sclerotic bone metastases. A follow up bone scan could be considered when clinically appropriate. -Needs Follow up with urology   4-Weakness, Debility, Gait Disturbance.  Needs Home health PT     Pressure Injury 08/04/22 Buttocks Right;Left Stage 1 -  Intact skin with non-blanchable redness of a localized area usually over a bony prominence. (Active)  08/04/22 1840  Location: Buttocks  Location Orientation: Right;Left  Staging: Stage 1 -  Intact skin with non-blanchable redness of a localized area usually over a bony prominence.  Wound Description (Comments):   Present on Admission:   Dressing Type Foam - Lift dressing to assess site every shift 08/09/22 2100     Estimated body mass index is 22.63 kg/m as calculated from the following:   Height as of this encounter: 5\' 6"  (1.676 m).   Weight as of this encounter: 63.6 kg.   DVT prophylaxis: Heparin  Code Status: Full code Family Communication: Care discussed with patient.  Disposition Plan:  Status is: Inpatient Remains inpatient appropriate because: management of COPD, hypoxia. Plan for home 4/5 if oxygen requirement remain stable.     Consultants:  Urology, Dr. Junious Silk -Pulmonology  Procedures:  Foley catheter placement 3/29  Antimicrobials:  Azithromycin.   Subjective: He is breathing better.   Objective: Vitals:   08/10/22 0136 08/10/22 0455 08/10/22 0820 08/10/22 1341  BP:  132/69  123/72  Pulse:  88  78  Resp:  19  18  Temp:  98.4 F (36.9 C)  97.8 F (36.6 C)  TempSrc:  Oral  Oral  SpO2: 96% 96% 95% 99%  Weight:      Height:        Intake/Output Summary (Last 24 hours) at 08/10/2022 1343 Last data filed at  08/10/2022 0800 Gross per 24 hour  Intake 240 ml  Output 1000 ml  Net -760 ml    Filed Weights   08/09/22 2300  Weight: 63.6 kg    Examination:  General exam: NAD Respiratory system: BL Ronchus.  Cardiovascular system: S 1, S 2  RRR Gastrointestinal system: BS present, soft, nt Central nervous system: Alert. Follows command Extremities: No edema    Data Reviewed: I have personally reviewed following labs and imaging studies  CBC: Recent Labs  Lab 08/04/22 1353 08/04/22 1911 08/05/22 0539 08/06/22 0622 08/07/22 0810 08/09/22 0601  WBC 17.1* 13.5* 10.7* 11.8* 9.7 8.3  NEUTROABS 14.6* 13.1*  --  10.3*  --   --   HGB 13.5 12.9* 10.8* 10.9* 11.9* 12.2*  HCT 42.3 40.6 35.3* 35.1* 39.1 40.4  MCV 89.2 91.2 94.1 94.1 94.4 93.1  PLT 194 187 162 169 169 [id number]    Basic Metabolic Panel: Recent Labs  Lab 08/04/22 1353 08/04/22 1911 08/05/22 0539 08/06/22 0622 08/09/22 0601  NA 132* 137 140 140 141  K 4.9 4.3 4.7 4.2 4.6  CL 85* 96* 100 96* 93*  CO2 33* 31 30 35* 40*  GLUCOSE 111* 121* 92 124* 111*  BUN 61* 51* 40* 32* 28*  CREATININE 4.20* 2.54* 1.23 0.78 0.70  CALCIUM 9.6 8.9 8.4* 8.8* 9.0  MG  --  2.9*  --  2.1 2.2  PHOS  --  5.2*  --  2.8  --     GFR: Estimated Creatinine Clearance: 79.5 mL/min (by C-G formula based on SCr of 0.7 mg/dL). Liver Function Tests: Recent Labs  Lab 08/04/22 1911 08/06/22 0622  AST 27  --   ALT 15  --   ALKPHOS 61  --   BILITOT 0.7  --   PROT 6.9  --   ALBUMIN 3.5 3.0*    No results for input(s): "LIPASE", "AMYLASE" in the last 168 hours. No results for input(s): "AMMONIA" in the last 168 hours. Coagulation Profile: No results for input(s): "INR", "PROTIME" in the last 168 hours. Cardiac Enzymes: No results for input(s): "CKTOTAL", "CKMB", "CKMBINDEX", "TROPONINI" in the last 168 hours. BNP (last 3 results) No results for input(s): "PROBNP" in the last 8760 hours. HbA1C: No results for input(s): "HGBA1C" in the last 72 hours. CBG: No results for input(s): "GLUCAP" in the last 168 hours. Lipid Profile: No results for input(s): "CHOL", "HDL", "LDLCALC", "TRIG", "CHOLHDL", "LDLDIRECT" in the last 72 hours. Thyroid Function Tests: No results for input(s):  "TSH", "T4TOTAL", "FREET4", "T3FREE", "THYROIDAB" in the last 72 hours. Anemia Panel: No results for input(s): "VITAMINB12", "FOLATE", "FERRITIN", "TIBC", "IRON", "RETICCTPCT" in the last 72 hours. Sepsis Labs: No results for input(s): "PROCALCITON", "LATICACIDVEN" in the last 168 hours.  Recent Results (from the past 240 hour(s))  Respiratory (~20 pathogens) panel by PCR     Status: None   Collection Time: 08/04/22  6:56 PM   Specimen: Nasopharyngeal Swab; Respiratory  Result Value Ref Range Status   Adenovirus NOT DETECTED NOT DETECTED Final   Coronavirus 229E NOT DETECTED NOT DETECTED Final    Comment: (NOTE) The Coronavirus on the Respiratory Panel, DOES NOT test for the novel  Coronavirus (2019 nCoV)    Coronavirus HKU1 NOT DETECTED NOT DETECTED Final   Coronavirus NL63 NOT DETECTED NOT DETECTED Final   Coronavirus OC43 NOT DETECTED NOT DETECTED Final   Metapneumovirus NOT DETECTED NOT DETECTED Final   Rhinovirus / Enterovirus NOT DETECTED NOT DETECTED Final   Influenza A NOT DETECTED  NOT DETECTED Final   Influenza B NOT DETECTED NOT DETECTED Final   Parainfluenza Virus 1 NOT DETECTED NOT DETECTED Final   Parainfluenza Virus 2 NOT DETECTED NOT DETECTED Final   Parainfluenza Virus 3 NOT DETECTED NOT DETECTED Final   Parainfluenza Virus 4 NOT DETECTED NOT DETECTED Final   Respiratory Syncytial Virus NOT DETECTED NOT DETECTED Final   Bordetella pertussis NOT DETECTED NOT DETECTED Final   Bordetella Parapertussis NOT DETECTED NOT DETECTED Final   Chlamydophila pneumoniae NOT DETECTED NOT DETECTED Final   Mycoplasma pneumoniae NOT DETECTED NOT DETECTED Final    Comment: Performed at [facility], [place] [address] [place], [place] [address]  Resp panel by RT-PCR (RSV, Flu A&B, Covid) Anterior Nasal Swab     Status: None   Collection Time: 08/04/22  6:56 PM   Specimen: Anterior Nasal Swab  Result Value Ref Range Status   SARS Coronavirus 2 by RT PCR NEGATIVE NEGATIVE Final     Comment: (NOTE) SARS-CoV-2 target nucleic acids are NOT DETECTED.  The SARS-CoV-2 RNA is generally detectable in upper respiratory specimens during the acute phase of infection. The lowest concentration of SARS-CoV-2 viral copies this assay can detect is 138 copies/mL. A negative result does not preclude SARS-Cov-2 infection and should not be used as the sole basis for treatment or other patient management decisions. A negative result may occur with  improper specimen collection/handling, submission of specimen other than nasopharyngeal swab, presence of viral mutation(s) within the areas targeted by this assay, and inadequate number of viral copies(<138 copies/mL). A negative result must be combined with clinical observations, patient history, and epidemiological information. The expected result is Negative.  Fact Sheet for Patients:  EntrepreneurPulse.com.au  Fact Sheet for Healthcare Providers:  IncredibleEmployment.be  This test is no t yet approved or cleared by the [place] FDA and  has been authorized for detection and/or diagnosis of SARS-CoV-2 by FDA under an Emergency Use Authorization (EUA). This EUA will remain  in effect (meaning this test can be used) for the duration of the COVID-19 declaration under Section 564(b)(1) of the Act, 21 U.S.C.section 360bbb-3(b)(1), unless the authorization is terminated  or revoked sooner.       Influenza A by PCR NEGATIVE NEGATIVE Final   Influenza B by PCR NEGATIVE NEGATIVE Final    Comment: (NOTE) The Xpert Xpress SARS-CoV-2/FLU/RSV plus assay is intended as an aid in the diagnosis of influenza from Nasopharyngeal swab specimens and should not be used as a sole basis for treatment. Nasal washings and aspirates are unacceptable for Xpert Xpress SARS-CoV-2/FLU/RSV testing.  Fact Sheet for Patients: EntrepreneurPulse.com.au  Fact Sheet for Healthcare  Providers: IncredibleEmployment.be  This test is not yet approved or cleared by the [place] FDA and has been authorized for detection and/or diagnosis of SARS-CoV-2 by FDA under an Emergency Use Authorization (EUA). This EUA will remain in effect (meaning this test can be used) for the duration of the COVID-19 declaration under Section 564(b)(1) of the Act, 21 U.S.C. section 360bbb-3(b)(1), unless the authorization is terminated or revoked.     Resp Syncytial Virus by PCR NEGATIVE NEGATIVE Final    Comment: (NOTE) Fact Sheet for Patients: EntrepreneurPulse.com.au  Fact Sheet for Healthcare Providers: IncredibleEmployment.be  This test is not yet approved or cleared by the [place] FDA and has been authorized for detection and/or diagnosis of SARS-CoV-2 by FDA under an Emergency Use Authorization (EUA). This EUA will remain in effect (meaning this test can be used) for the duration  of the COVID-19 declaration under Section 564(b)(1) of the Act, 21 U.S.C. section 360bbb-3(b)(1), unless the authorization is terminated or revoked.  Performed at [facility], [place] [address] [place], [place] [address]          Radiology Studies: No results found.      Scheduled Meds:  arformoterol  15 mcg Nebulization BID   budesonide (PULMICORT) nebulizer solution  0.25 mg Nebulization BID   Chlorhexidine Gluconate Cloth  6 each Topical Daily   fluticasone  2 spray Each Nare BID   guaiFENesin  600 mg Oral BID   heparin injection (subcutaneous)  5,000 Units Subcutaneous Q8H   ipratropium-albuterol  3 mL Nebulization Q6H   loratadine  10 mg Oral Daily   methylPREDNISolone (SOLU-MEDROL) injection  40 mg Intravenous Q12H   tamsulosin  0.4 mg Oral Daily   Continuous Infusions:   LOS: 5 days    Time spent: 35 miutes    January Bergthold A Jeremiah Tarpley, MD [facility]   If 7PM-7AM, please contact  night-coverage [email or web address]  08/10/2022, 1:43 PM

## 2022-08-10 NOTE — Plan of Care (Signed)

## 2022-08-10 NOTE — TOC Progression Note (Addendum)
Transition of Care [facility](TOC) - Progression Note    Patient Details  Name: Frank [name] MRN: [id number] Date of Birth: Oct 31, 1955  Transition of Care [facility](TOC) CM/SW Contact  Otelia SanteeMeredith A Uniqua Kihn, LCSW Phone Number: 08/10/2022, 12:39 PM  Clinical Narrative:    Met with pt to discuss PT recommendation for a rollator. Pt shares that he has a RW and wheelchair at home. He reports using the RW while at home and the wheelchair when going to appointments. Pt received RW on 03/06/2020. As pt has received RW within the past 5 years insurance will not cover rollator. Pt's daughter will be private paying for rollator. Rollator ordered through [employer] and will be delivered to pt room today. Pt also screened for SDOH transportation concerns. Pt shares that his sister provides transportation to appointment. CSW shared information about calling insurance provider for possible transportation in the case sister is unable to transport.    Addendum: Pt's supply declined rollator be delivered by [employer] and plans to purchase rollator cheaper elsewhere.  Expected Discharge Plan: Home w Home Health Services Barriers to Discharge: Continued Medical Work up  Expected Discharge Plan and Services In-house Referral: Clinical Social Work   Post Acute Care Choice: Skilled Nursing Facility Living arrangements for the past 2 months: Single Family Home                 DME Arranged: N/A DME Agency: NA       HH Arranged: PT, OT, RN, Nurse's Aide [facility]: 08/08/22 Time HH Agency Contacted: [phone] [facility]: [facility]   Social Determinants of Health (SDOH) Interventions SDOH Screenings   Food Insecurity: No Food Insecurity (08/08/2022)  Housing: Low Risk  (08/08/2022)  Transportation Needs: Unmet Transportation Needs (08/08/2022)  Utilities: Not At Risk (08/08/2022)  Alcohol Screen: Low Risk  (07/17/2022)  Depression (PHQ2-9): Low Risk  (07/17/2022)   Financial Resource Strain: Low Risk  (07/17/2022)  Physical Activity: Inactive (07/17/2022)  Social Connections: Socially Isolated (07/17/2022)  Stress: No Stress Concern Present (07/17/2022)  Tobacco Use: Medium Risk (08/04/2022)    Readmission Risk Interventions    08/08/2022   10:30 AM  Readmission Risk Prevention Plan  Transportation Screening Complete  PCP or Specialist Appt within 5-7 Days Complete  Home Care Screening Complete  Medication Review (RN CM) Complete

## 2022-08-10 NOTE — Progress Notes (Signed)
Occupational Therapy Treatment Patient Details Name: Frank [name] MRN: [id number] DOB: 1955/07/09 Today's Date: 08/10/2022   History of present illness Patient is a 67 year old male who presented with SOB,and inability to pass urine.  Patient was admitted with COPD exacerbation, acute on chronic respiratory failure, AKI, and urinary retention. PMH: prostate cancer, BPH, UTI symptoms, cancer, nephrolithiasis.   OT comments  Patient with fair progress toward patient focused goals.  Patient continues to desaturate on 4L (baseline O2 requirements) with any mobility.  Patient down to 83% on 4L, and returned to 6L with rest break for O2 sats to hold steady at 89%.  Patient needing generalized Min Guard for in room mobility, and up to Roberta for ADL completion at a sit to stand level.  OT continues to be indicated in the acute setting to address deficits, with home based rehab post acute.     Recommendations for follow up therapy are one component of a multi-disciplinary discharge planning process, led by the attending physician.  Recommendations may be updated based on patient status, additional functional criteria and insurance authorization.    Assistance Recommended at Discharge Intermittent Supervision/Assistance  Patient can return home with the following  A little help with walking and/or transfers;Assistance with cooking/housework;Assist for transportation;Help with stairs or ramp for entrance;A little help with bathing/dressing/bathroom   Equipment Recommendations  None recommended by OT    Recommendations for Other Services      Precautions / Restrictions Precautions Precautions: Fall Precaution Comments: monitor O2, foley Restrictions Weight Bearing Restrictions: No       Mobility Bed Mobility Overal bed mobility: Needs Assistance Bed Mobility: Supine to Sit, Sit to Supine     Supine to sit: Independent Sit to supine: Independent        Transfers Overall transfer  level: Needs assistance Equipment used: Rolling walker (2 wheels) Transfers: Sit to/from Stand, Bed to chair/wheelchair/BSC Sit to Stand: Supervision     Step pivot transfers: Min guard           Balance Overall balance assessment: Needs assistance Sitting-balance support: Feet supported Sitting balance-Leahy Scale: Good     Standing balance support: Reliant on assistive device for balance Standing balance-Leahy Scale: Poor                             ADL either performed or assessed with clinical judgement   ADL       Grooming: Wash/dry face;Supervision/safety;Standing;Wash/dry hands               Lower Body Dressing: Min guard;Minimal assistance;Sit to/from stand   Toilet Transfer: Min guard;Rolling walker (2 wheels);Regular Toilet                  Extremity/Trunk Assessment Upper Extremity Assessment Upper Extremity Assessment: Overall WFL for tasks assessed   Lower Extremity Assessment Lower Extremity Assessment: Defer to PT evaluation                          Cognition Arousal/Alertness: Awake/alert Behavior During Therapy: WFL for tasks assessed/performed Overall Cognitive Status: Within Functional Limits for tasks assessed  Pertinent Vitals/ Pain       Pain Assessment Pain Assessment: No/denies pain Pain Intervention(s): Monitored during session                                                          Frequency  Min 2X/week        Progress Toward Goals  OT Goals(current goals can now be found in the care plan section)  Progress towards OT goals: Progressing toward goals  Acute Rehab OT Goals OT Goal Formulation: With patient Time For Goal Achievement: 08/20/22 Potential to Achieve Goals: [place] Discharge plan needs to be updated    Co-evaluation                 AM-PAC OT "6 Clicks"  Daily Activity     Outcome Measure   Help from another person eating meals?: None Help from another person taking care of personal grooming?: A Little Help from another person toileting, which includes using toliet, bedpan, or urinal?: A Little Help from another person bathing (including washing, rinsing, drying)?: A Little Help from another person to put on and taking off regular upper body clothing?: None Help from another person to put on and taking off regular lower body clothing?: A Little 6 Click Score: 20    End of Session Equipment Utilized During Treatment: Rolling walker (2 wheels);Oxygen  OT Visit Diagnosis: Unsteadiness on feet (R26.81);Other abnormalities of gait and mobility (R26.89);Muscle weakness (generalized) (M62.81)   Activity Tolerance Patient limited by fatigue   Patient Left in bed;with call bell/phone within reach   Nurse Communication Mobility status        Time: [id number] OT Time Calculation (min): 17 min  Charges: OT General Charges $OT Visit: 1 Visit OT Treatments $Self Care/Home Management : 8-22 mins  08/10/2022  RP, OTR/L  Acute Rehabilitation Services  Office:  [phone]   Metta Clines 08/10/2022, 1:55 PM

## 2022-08-10 NOTE — Care Management Important Message (Signed)
Important Message  Patient Details IM Letter given. Name: Frank [name] MRN: [id number] Date of Birth: 1955-06-14   Medicare Important Message Given:  Yes     [name] 08/10/2022, 10:48 AM

## 2022-08-11 DIAGNOSIS — J441 Chronic obstructive pulmonary disease with (acute) exacerbation: Secondary | ICD-10-CM | POA: Diagnosis not present

## 2022-08-11 MED ORDER — PREDNISONE 20 MG PO TABS: ORAL_TABLET | ORAL | 0 refills | Status: DC

## 2022-08-11 MED ORDER — IPRATROPIUM-ALBUTEROL 0.5-2.5 (3) MG/3ML IN SOLN: 3.0000 mL | Freq: Four times a day (QID) | RESPIRATORY_TRACT | 0 refills | Status: DC

## 2022-08-11 MED ORDER — ALBUTEROL SULFATE 0.63 MG/3ML IN NEBU: 1.0000 | INHALATION_SOLUTION | Freq: Four times a day (QID) | RESPIRATORY_TRACT | 0 refills | Status: DC | PRN

## 2022-08-11 MED ORDER — LORATADINE 10 MG PO TABS: 10.0000 mg | ORAL_TABLET | Freq: Every day | ORAL | 0 refills | Status: DC

## 2022-08-11 MED ORDER — GUAIFENESIN ER 600 MG PO TB12: 600.0000 mg | ORAL_TABLET | Freq: Two times a day (BID) | ORAL | 0 refills | Status: DC

## 2022-08-11 NOTE — Discharge Summary (Signed)
Physician Discharge Summary   Patient: RICHARDD JOYNER MRN: [id number] DOB: 08/24/1955  Admit date:     08/04/2022  Discharge date: 08/11/22  Discharge Physician: Alba Cory   PCP: Crissie Sickles, MD   Recommendations at discharge:    Follow up for further medical management of COPD>  Needs to follow up with urology for voiding trial.   Discharge Diagnoses: Principal Problem:   COPD exacerbation Active Problems:   Acute on chronic respiratory failure   Pressure injury of skin  Resolved Problems:   * No resolved hospital problems. 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  Presented hypoxic with oxygen saturations 79 on 4 L, leukocytosis white blood cell 17.  In AKI with a creatinine of 4.2.  Patient was admitted for obstructive uropathy, acute renal failure, acute on chronic respiratory failure secondary to COPD exacerbation.    Assessment and Plan: 1-Acute on chronic respiratory failure with hypoxia Acute COPD exacerbation: -Treated  with IV Solu-Medrol -Continue with Pulmicort, DuoNeb -Oxygen requirement down to 5 L.  -Continue with brovana, schedule Guaifenesin.  Improved, down to 4--5 L oxygen at rest. Stable to discharge home. Discharge on prednisone taper for several days, then resume chronic home dose prednisone.    2-Acute renal failure secondary to obstructive uropathy BPH -Presented with a creatinine of 4.2.  Foley catheter placed in the ED.  Urology was consulted and recommended continue Foley catheter and outpatient follow-up in the clinic for voiding trial. -CT: Mild bilateral renal collecting system dilation, punctuate nonobstructing right-sided renal stone.  Increasing retroperitoneal stranding diffusely. -Renal ultrasound:  Extrarenal pelvis noted to each kidney.  No separate collecting system dilation.  Parapelvic right-sided renal cyst.  -Creatinine has decreased from 4.2-0.7 -Continue with Foley catheter care -Continue with Flomax -Resolved.    3-History of prostate cancer: Follow-up with urology as an outpatient CT: Heterogeneous prostate with enlarged appearance of the left seminal vesicle. Please correlate for known history of prostate neoplasm. In addition there is increasing extent and distribution of sclerotic bone metastases. A follow up bone scan could be considered when clinically appropriate. -Needs Follow up with urology    4-Weakness, Debility, Gait Disturbance.  Needs Home health PT             Pressure Injury 08/04/22 Buttocks Right;Left Stage 1 -  Intact skin with non-blanchable redness of a localized area usually over a bony prominence. (Active)  08/04/22 1840  Location: Buttocks  Location Orientation: Right;Left  Staging: Stage 1 -  Intact skin with non-blanchable redness of a localized area usually over a bony prominence.  Wound Description (Comments):   Present on Admission:   Dressing Type Foam - Lift dressing to assess site every shift 08/09/22 2100             Consultants: Urology, Pulmonologist  Procedures performed: none  Disposition: Home Diet recommendation:  Discharge Diet Orders (From admission, onward)     Start     Ordered   08/11/22 0000  Diet - low sodium heart healthy        08/11/22 1118           Cardiac diet DISCHARGE MEDICATION: Allergies as of 08/11/2022   No Known Allergies      Medication List     STOP taking these  medications    oxyCODONE-acetaminophen 5-325 MG tablet Commonly known as: Percocet       TAKE these medications    albuterol 108 (90 Base) MCG/ACT inhaler Commonly known as: Ventolin HFA INHALE 2 PUFFS BY MOUTH EVERY 4 HOURS AS NEEDED FOR WHEEZING OR SHORTNESS OF BREATH What changed:  how much to take how to  take this when to take this reasons to take this additional instructions   albuterol 0.63 MG/3ML nebulizer solution Commonly known as: ACCUNEB Take 3 mLs (0.63 mg total) by nebulization every 6 (six) hours as needed for shortness of breath. USE 1 VIAL IN NEBULIZER EVERY 6 HOURS What changed:  how much to take how to take this when to take this reasons to take this   calcium-vitamin D 500-5 MG-MCG tablet Commonly known as: OSCAL WITH D Take 1 tablet by mouth daily with breakfast.   fluticasone 50 MCG/ACT nasal spray Commonly known as: FLONASE Place 1 spray into both nostrils daily.   guaiFENesin 600 MG 12 hr tablet Commonly known as: MUCINEX Take 1 tablet (600 mg total) by mouth 2 (two) times daily. What changed:  when to take this reasons to take this   ipratropium-albuterol 0.5-2.5 (3) MG/3ML Soln Commonly known as: DUONEB Take 3 mLs by nebulization every 6 (six) hours.   loratadine 10 MG tablet Commonly known as: CLARITIN Take 1 tablet (10 mg total) by mouth daily. Start taking on: August 12, 2022   OXYGEN Inhale 4 L/min into the lungs continuous.   predniSONE 20 MG tablet Commonly known as: DELTASONE Take 3 tablets for 3 days then 2 tablets for 3 days then 1 tablet for 3 days then resume home dose prednisone of 10 mg daily. What changed:  medication strength See the new instructions.   tamsulosin 0.4 MG Caps capsule Commonly known as: FLOMAX TAKE 1 CAPSULE(0.4 MG) BY MOUTH DAILY What changed: See the new instructions.   Trelegy Ellipta 100-62.5-25 MCG/ACT Aepb Generic drug: Fluticasone-Umeclidin-Vilant INHALE 1 PUFF INTO THE LUNGS DAILY What changed: how to take this               Durable Medical Equipment  (From admission, onward)           Start     Ordered   08/08/22 1353  For home use only DME oxygen  Once       Question Answer Comment  Length of Need Lifetime   Mode or (Route) Nasal cannula   Liters per Minute 6   Frequency  Continuous (stationary and portable oxygen unit needed)   Oxygen conserving device Yes   Oxygen delivery system Gas      08/08/22 1352            Follow-up Information     Sebastian AcheManny, Theodore, MD. Call.   Specialty: Urology Contact information: [address] [place] [address] [phone]         [facility].   Specialty: Home Health Services Why: [facility] will contact you at discharge to provide home health services Contact information: [address] [place] [address] [phone]                Discharge Exam: Ceasar MonsFiled Weights   08/09/22 2300  Weight: 63.6 kg   General; NAD  Condition at discharge: stable  The results of significant diagnostics from this hospitalization (including imaging, microbiology, ancillary and laboratory) are listed below for reference.   Imaging Studies: DG CHEST PORT 1  VIEW  Result Date: 08/05/2022 CLINICAL DATA:  Shortness of breath. EXAM: PORTABLE CHEST 1 VIEW COMPARISON:  Radiograph yesterday. FINDINGS: Chronic hyperinflation with apical predominant emphysema. Bronchovascular crowding at the lung bases likely due to emphysematous change. The heart is normal in size. Stable mediastinal contours. Aortic atherosclerosis. No pulmonary edema, pleural effusion or pneumothorax. Stable osseous structures. IMPRESSION: Emphysema without acute abnormality. Electronically Signed   By: Narda Rutherford M.D.   On: 08/05/2022 15:15   US RENAL  Result Date: 08/04/2022 CLINICAL DATA:  Acute kidney insufficiency EXAM: RENAL / URINARY TRACT ULTRASOUND COMPLETE COMPARISON:  CT 08/04/2022 earlier FINDINGS: Right Kidney: Renal measurements: 11.5 x 5.3 x 5.4 = volume: 172.8 mL. No collecting system dilatation perinephric fluid. There is a parapelvic right-sided renal cyst as seen on CT. This measures up to 2.7 cm by ultrasound. Extrarenal pelvis noted on the right which is more distended than on the prior CT scan. Left  Kidney: Renal measurements: 11.9 x 5.5 x 6.1 = volume: 209.4 mL. Extrarenal pelvis of the left kidney is also noted and similar to previous when adjusting for technique. No collecting system dilatation or perinephric fluid. Bladder: Bladder is contracted with a Foley catheter. Other: Study somewhat limited by breathing motion as per the sonographer in overlapping soft tissue IMPRESSION: Extrarenal pelvis noted to each kidney. No separate collecting system dilatation. Parapelvic right-sided renal cyst as seen on CT. Electronically Signed   By: Karen Kays M.D.   On: 08/04/2022 17:54   CT Renal Stone Study  Result Date: 08/04/2022 CLINICAL DATA:  Flank pain. Difficulty urinating. 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 RADIATION DOSE REDUCTION: This exam was performed according to the departmental dose-optimization program which includes automated exposure control, adjustment of the mA and/or kV according to patient size and/or use of iterative reconstruction technique. COMPARISON:  Whole-body bone scan 12/07/2021.  CT scan 12/06/2021 FINDINGS: Lower chest: Breathing motion identified. Advanced centrilobular emphysematous changes are seen along the lung bases. No pleural effusion. There is some subtle opacity dependently along the right lower lobe. Atelectasis is favored. Small hiatal hernia. Hepatobiliary: No focal liver abnormality is seen. No gallstones, gallbladder wall thickening, or biliary dilatation. Pancreas: Unremarkable. No pancreatic ductal dilatation or surrounding inflammatory changes. Spleen: Normal in size without focal abnormality.  Small splenule Adrenals/Urinary Tract: The adrenal glands are preserved. Perinephric stranding identified bilaterally, right-greater-than-left. Extrarenal pelvis noted  bilaterally with slight ectasia of the right renal collecting system greater than left but both ureters are ectatic down to the level of the bladder. The bladder is contracted with a Foley catheter. There are some calcifications seen within the bladder but near the margin of the right UVJ. Component of UVJ calcification on the right is possible. No ureteral stones otherwise identified. Punctate lower pole right-sided renal stone. Previously there were bladder stones. There is a calcification seen measuring 6 mm on series 2, image 78 just to the right of the course of the prosthetic urethra. This could be calcification with in the prostate. Stomach/Bowel: Normal retrocecal appendix. Moderate colonic stool. The bowel is nondilated. This includes stomach, small bowel and large bowel. Vascular/Lymphatic: Extensive vascular calcifications along the aorta and branch vessels. There is ectatic appearance to the celiac with diameter approaching 14 mm. Preserved IVC. No specific abnormal lymph node enlargement seen in the abdomen and pelvis. Reproductive: Enlarged  appearance of the left seminal vesicle but unchanged from prior. There is also ill-defined margin along the prostate. Please correlate with the history of prostate neoplasm Other: Scattered retroperitoneal stranding extending down from the kidneys to the pelvis. This has increased from the prior CT scan. Musculoskeletal: Curvature of the spine. Scattered degenerative changes of the spine and pelvis. There are bridging osteophytes along the sacroiliac joints. Trace anterolisthesis of L5 on S1 with pars defects. There are scattered areas of bony sclerosis seen throughout the skeleton consistent with known osseous metastatic disease. Compared to the prior CT scan these are increased in extent and distribution. Follow up bone scan as clinically appropriate IMPRESSION: Interval development of mild bilateral renal collecting system dilatation, right-greater-than-left down  to the bladder. There are some calcifications along the bladder on the right side near the margin of the right UVJ. Otherwise no ureteral stones. Punctate nonobstructing right-sided renal stone. Etiology of the dilatation is uncertain overall. Postcontrast study could be considered with delayed imaging as clinically appropriate. The bladder is contracted with a Foley catheter. Increasing retroperitoneal stranding diffusely. Heterogeneous prostate with enlarged appearance of the left seminal vesicle. Please correlate for known history of prostate neoplasm. In addition there is increasing extent and distribution of sclerotic bone metastases. A follow up bone scan could be considered when clinically appropriate. No bowel obstruction, free air. Normal appendix. Small hiatal hernia. Diffuse atherosclerotic changes. 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 Electronically Signed   By: Karen Kays M.D.   On: 08/04/2022 17:14   DG Chest Portable 1 View  Result Date: 08/04/2022 CLINICAL DATA:  Shortness of breath. Inability to urinate for 3 days. History of metastatic prostate cancer. EXAM: PORTABLE CHEST 1 VIEW COMPARISON:  Radiographs 03/31/2020 and 02/28/2020. FINDINGS: 1249 hours. The heart size and mediastinal contours are stable. There is aortic atherosclerosis. The lungs are hyperinflated but clear. No pleural effusion or pneumothorax. No acute osseous findings are evident. Telemetry leads overlie the chest. IMPRESSION: Chronic obstructive pulmonary disease. No evidence of acute cardiopulmonary process. Electronically Signed   By: Carey Bullocks M.D.   On: 08/04/2022 13:33    Microbiology: Results for orders placed or performed during the hospital encounter of 08/04/22  Respiratory (~20 pathogens) panel by PCR     Status: None   Collection Time: 08/04/22  6:56 PM   Specimen: Nasopharyngeal Swab; Respiratory  Result Value Ref Range Status   Adenovirus NOT DETECTED NOT DETECTED Final   Coronavirus  229E NOT DETECTED NOT DETECTED Final    Comment: (NOTE) The Coronavirus on the Respiratory Panel, DOES NOT test for the novel  Coronavirus (2019 nCoV)    Coronavirus HKU1 NOT DETECTED NOT DETECTED Final   Coronavirus NL63 NOT DETECTED NOT DETECTED Final   Coronavirus OC43 NOT DETECTED NOT DETECTED Final   Metapneumovirus NOT DETECTED NOT DETECTED Final   Rhinovirus / Enterovirus NOT DETECTED NOT DETECTED Final   Influenza A NOT DETECTED NOT DETECTED Final   Influenza B NOT DETECTED NOT DETECTED Final   Parainfluenza Virus 1 NOT DETECTED NOT DETECTED Final   Parainfluenza Virus 2 NOT DETECTED NOT DETECTED Final   Parainfluenza Virus 3 NOT DETECTED NOT DETECTED Final   Parainfluenza Virus 4 NOT DETECTED NOT DETECTED Final   Respiratory Syncytial Virus NOT DETECTED NOT DETECTED Final   Bordetella pertussis NOT DETECTED NOT DETECTED Final   Bordetella Parapertussis NOT DETECTED NOT DETECTED Final   Chlamydophila pneumoniae NOT DETECTED NOT DETECTED Final   Mycoplasma pneumoniae NOT DETECTED NOT  DETECTED Final    Comment: Performed at [facility], [address], [place], [place] [address]  Resp panel by RT-PCR (RSV, Flu A&B, Covid) Anterior Nasal Swab     Status: None   Collection Time: 08/04/22  6:56 PM   Specimen: Anterior Nasal Swab  Result Value Ref Range Status   SARS Coronavirus 2 by RT PCR NEGATIVE NEGATIVE Final    Comment: (NOTE) SARS-CoV-2 target nucleic acids are NOT DETECTED.  The SARS-CoV-2 RNA is generally detectable in upper respiratory specimens during the acute phase of infection. The lowest concentration of SARS-CoV-2 viral copies this assay can detect is 138 copies/mL. A negative result does not preclude SARS-Cov-2 infection and should not be used as the sole basis for treatment or other patient management decisions. A negative result may occur with  improper specimen collection/handling, submission of specimen other than nasopharyngeal swab, presence of  viral mutation(s) within the areas targeted by this assay, and inadequate number of viral copies(<138 copies/mL). A negative result must be combined with clinical observations, patient history, and epidemiological information. The expected result is Negative.  Fact Sheet for Patients:  BloggerCourse.com  Fact Sheet for Healthcare Providers:  SeriousBroker.it  This test is no t yet approved or cleared by the [place] FDA and  has been authorized for detection and/or diagnosis of SARS-CoV-2 by FDA under an Emergency Use Authorization (EUA). This EUA will remain  in effect (meaning this test can be used) for the duration of the COVID-19 declaration under Section 564(b)(1) of the Act, 21 U.S.C.section 360bbb-3(b)(1), unless the authorization is terminated  or revoked sooner.       Influenza A by PCR NEGATIVE NEGATIVE Final   Influenza B by PCR NEGATIVE NEGATIVE Final    Comment: (NOTE) The Xpert Xpress SARS-CoV-2/FLU/RSV plus assay is intended as an aid in the diagnosis of influenza from Nasopharyngeal swab specimens and should not be used as a sole basis for treatment. Nasal washings and aspirates are unacceptable for Xpert Xpress SARS-CoV-2/FLU/RSV testing.  Fact Sheet for Patients: BloggerCourse.com  Fact Sheet for Healthcare Providers: SeriousBroker.it  This test is not yet approved or cleared by the [place] FDA and has been authorized for detection and/or diagnosis of SARS-CoV-2 by FDA under an Emergency Use Authorization (EUA). This EUA will remain in effect (meaning this test can be used) for the duration of the COVID-19 declaration under Section 564(b)(1) of the Act, 21 U.S.C. section 360bbb-3(b)(1), unless the authorization is terminated or revoked.     Resp Syncytial Virus by PCR NEGATIVE NEGATIVE Final    Comment: (NOTE) Fact Sheet for  Patients: BloggerCourse.com  Fact Sheet for Healthcare Providers: SeriousBroker.it  This test is not yet approved or cleared by the [place] FDA and has been authorized for detection and/or diagnosis of SARS-CoV-2 by FDA under an Emergency Use Authorization (EUA). This EUA will remain in effect (meaning this test can be used) for the duration of the COVID-19 declaration under Section 564(b)(1) of the Act, 21 U.S.C. section 360bbb-3(b)(1), unless the authorization is terminated or revoked.  Performed at [facility], [address], [place], [place] [address]     Labs: CBC: Recent Labs  Lab 08/04/22 1353 08/04/22 1911 08/05/22 0539 08/06/22 0622 08/07/22 0810 08/09/22 0601  WBC 17.1* 13.5* 10.7* 11.8* 9.7 8.3  NEUTROABS 14.6* 13.1*  --  10.3*  --   --   HGB 13.5 12.9* 10.8* 10.9* 11.9* 12.2*  HCT 42.3 40.6 35.3* 35.1* 39.1 40.4  MCV 89.2 91.2 94.1 94.1 94.4 93.1  PLT 194 187 162 169 169 215   Basic Metabolic Panel: Recent Labs  Lab 08/04/22 1353 08/04/22 1911 08/05/22 0539 08/06/22 0622 08/09/22 0601  NA 132* 137 140 140 141  K 4.9 4.3 4.7 4.2 4.6  CL 85* 96* 100 96* 93*  CO2 33* 31 30 35* 40*  GLUCOSE 111* 121* 92 124* 111*  BUN 61* 51* 40* 32* 28*  CREATININE 4.20* 2.54* 1.23 0.78 0.70  CALCIUM 9.6 8.9 8.4* 8.8* 9.0  MG  --  2.9*  --  2.1 2.2  PHOS  --  5.2*  --  2.8  --    Liver Function Tests: Recent Labs  Lab 08/04/22 1911 08/06/22 0622  AST 27  --   ALT 15  --   ALKPHOS 61  --   BILITOT 0.7  --   PROT 6.9  --   ALBUMIN 3.5 3.0*   CBG: No results for input(s): "GLUCAP" in the last 168 hours.  Discharge time spent: greater than 30 minutes.  Signed: Alba Cory, MD [facility] 08/11/2022

## 2022-08-11 NOTE — Progress Notes (Addendum)
Physical Therapy Treatment Patient Details Name: Frank [name] MRN: [id number] DOB: 1955-08-18 Today's Date: 08/11/2022   History of Present Illness Patient is a 67 year old male who presented with SOB,and inability to pass urine.  Patient was admitted with COPD exacerbation, acute on chronic respiratory failure, AKI, and urinary retention. PMH: prostate cancer, BPH, UTI symptoms, cancer, nephrolithiasis.    PT Comments    General Comments: AxO x 4 very pleasant and willing.  Admits to "getting tired" and "I can't do much".  Lives home alone with "some" help from sister (grocceries/MD aapts) pt also receives Meals on Wheels 5 days a week. Introduce Rollator to pt.  Educated on use and features.  Instructed on safety use (brakes).  Assisted Pt OOB to amb in hallway with Rollator while monitoring oxygen sats.  Pt has been on oxygen "for years" and was using 4 lts prior to admit.  Pt was able to amb in hallway on 4 lts with avg sats 90%.  Pt does VERY well to cease activity when he feels SOB and self perform purse lip breathing.  Pt tolerated a functional distance of 60 feet with 2 standing rest breaks.   Pt plans to return home alone. with family support (sister as before)   SATURATION QUALIFICATIONS: (This note is used to comply with regulatory documentation for home oxygen)   Patient Saturations on Room Air at Rest =  unable to trial RA (Hx oxygen 4 lts prior to admit)   Patient Saturations on 4 lts at rest 94%   Patient Saturations on Room Air while Ambulating =  unable to trial RA   Patient Saturations on 4 Liters of oxygen while Ambulating 60 feet = avg 90%   Please briefly explain why patient needs home oxygen:  Pt requires increased supplemental oxygen of 4 lts to maintain therapeutic levels.  Which is is prior level.   Recommendations for follow up therapy are one component of a multi-disciplinary discharge planning process, led by the attending physician.  Recommendations may be  updated based on patient status, additional functional criteria and insurance authorization.  Follow Up Recommendations  Can patient physically be transported by private vehicle: Yes    Assistance Recommended at Discharge PRN  Patient can return home with the following Assistance with cooking/housework;Help with stairs or ramp for entrance   Equipment Recommendations  Rollator (4 wheels) (sister plans to private pay)    Recommendations for Other Services       Precautions / Restrictions Precautions Precautions: Fall Precaution Comments: monitor O2, foley Restrictions Weight Bearing Restrictions: No     Mobility  Bed Mobility Overal bed mobility: Needs Assistance Bed Mobility: Supine to Sit     Supine to sit: Modified independent (Device/Increase time)     General bed mobility comments: self able with increased time    Transfers Overall transfer level: Needs assistance Equipment used: Rollator (4 wheels) Transfers: Sit to/from Stand Sit to Stand: Supervision           General transfer comment: VC's for hand placement as well as increased time due to effort.    Ambulation/Gait Ambulation/Gait assistance: Supervision, Min guard Gait Distance (Feet): 60 Feet Assistive device: Rollator (4 wheels) Gait Pattern/deviations: Step-through pattern, Decreased stride length Gait velocity: decreased     General Gait Details: assisted with amb with Rollator with VC's on proper use and safety with turns.   Stairs             Psychologist, prison and probation services  Modified Rankin (Stroke Patients Only)       Balance                                            Cognition Arousal/Alertness: Awake/alert Behavior During Therapy: WFL for tasks assessed/performed Overall Cognitive Status: Within Functional Limits for tasks assessed                                 General Comments: AxO x 4 very pleasant and willing.  Admits to "getting tired"  and "I can't do much".  Lives home alone with "some" help from sister (grocceries/MD aapts) pt also receives Meals on Wheels 5 days a week.        Exercises      General Comments        Pertinent Vitals/Pain Pain Assessment Pain Assessment: No/denies pain    Home Living                          Prior Function            PT Goals (current goals can now be found in the care plan section) Progress towards PT goals: Progressing toward goals    Frequency    Min 3X/week      PT Plan Current plan remains appropriate    Co-evaluation              AM-PAC PT "6 Clicks" Mobility   Outcome Measure  Help needed turning from your back to your side while in a flat bed without using bedrails?: None Help needed moving from lying on your back to sitting on the side of a flat bed without using bedrails?: None Help needed moving to and from a bed to a chair (including a wheelchair)?: None Help needed standing up from a chair using your arms (e.g., wheelchair or bedside chair)?: None Help needed to walk in hospital room?: A Little Help needed climbing 3-5 steps with a railing? : A Little 6 Click Score: 22    End of Session Equipment Utilized During Treatment: Gait belt Activity Tolerance: Patient tolerated treatment well Patient left: in chair;with call bell/phone within reach;with chair alarm set Nurse Communication: Mobility status PT Visit Diagnosis: Difficulty in walking, not elsewhere classified (R26.2)     Time: 1050-1120 PT Time Calculation (min) (ACUTE ONLY): 30 min  Charges:  $Gait Training: 8-22 mins $Therapeutic Activity: 8-22 mins                     Felecia ShellingLori Ivor Kishi  PTA Acute  Rehabilitation Services Office M-F          [phone]

## 2022-08-11 NOTE — Plan of Care (Signed)

## 2022-08-11 NOTE — Progress Notes (Signed)
Per night shift RN, pt did not tolerate 4L of O2. Placed back on 5L O2 overnight.

## 2022-08-11 NOTE — TOC Transition Note (Signed)
Transition of Care [facility]) - CM/SW Discharge Note   Patient Details  Name: Frank [name] MRN: [id number] Date of Birth: August 01, 1955  [facility]) CM/SW Contact:  Otelia Santee, LCSW Phone Number: 08/11/2022, 12:06 PM   Clinical Narrative:    Pt is to go home w/ home health services through [facility]. [facility] plans to meet with this pt over the weekend to begin services.    Final next level of care: Home w Home Health Services Barriers to Discharge: Barriers Resolved   Patient Goals and CMS Choice CMS Medicare.gov Compare Post Acute Care list provided to:: Patient Choice offered to / list presented to : Patient, Sibling  Discharge Placement                         Discharge Plan and Services Additional resources added to the After Visit Summary for   In-house Referral: Clinical Social Work   Post Acute Care Choice: Skilled Nursing Facility          DME Arranged: N/A DME Agency: NA       HH Arranged: PT, OT, RN, Nurse's Aide HH Agency: [facility] Date [facility] Agency Contacted: 08/08/22 Time HH Agency Contacted: [phone] Representative spoke with at [facility] Agency: Cindie  Social Determinants of Health (SDOH) Interventions SDOH Screenings   Food Insecurity: No Food Insecurity (08/08/2022)  Housing: Low Risk  (08/08/2022)  Transportation Needs: Unmet Transportation Needs (08/08/2022)  Utilities: Not At Risk (08/08/2022)  Alcohol Screen: Low Risk  (07/17/2022)  Depression (PHQ2-9): Low Risk  (07/17/2022)  Financial Resource Strain: Low Risk  (07/17/2022)  Physical Activity: Inactive (07/17/2022)  Social Connections: Socially Isolated (07/17/2022)  Stress: No Stress Concern Present (07/17/2022)  Tobacco Use: Medium Risk (08/04/2022)     Readmission Risk Interventions    08/08/2022   10:30 AM  Readmission Risk Prevention Plan  Transportation Screening Complete  PCP or Specialist Appt within 5-7 Days Complete  Home Care Screening Complete  Medication Review  (RN CM) Complete

## 2022-08-13 DIAGNOSIS — Z7951 Long term (current) use of inhaled steroids: Secondary | ICD-10-CM | POA: Diagnosis not present

## 2022-08-13 DIAGNOSIS — N179 Acute kidney failure, unspecified: Secondary | ICD-10-CM | POA: Diagnosis not present

## 2022-08-13 DIAGNOSIS — Z7952 Long term (current) use of systemic steroids: Secondary | ICD-10-CM | POA: Diagnosis not present

## 2022-08-13 DIAGNOSIS — Z466 Encounter for fitting and adjustment of urinary device: Secondary | ICD-10-CM | POA: Diagnosis not present

## 2022-08-13 DIAGNOSIS — Z9981 Dependence on supplemental oxygen: Secondary | ICD-10-CM | POA: Diagnosis not present

## 2022-08-13 DIAGNOSIS — N138 Other obstructive and reflux uropathy: Secondary | ICD-10-CM | POA: Diagnosis not present

## 2022-08-13 DIAGNOSIS — Z8616 Personal history of COVID-19: Secondary | ICD-10-CM | POA: Diagnosis not present

## 2022-08-13 DIAGNOSIS — C61 Malignant neoplasm of prostate: Secondary | ICD-10-CM | POA: Diagnosis not present

## 2022-08-13 DIAGNOSIS — C7951 Secondary malignant neoplasm of bone: Secondary | ICD-10-CM | POA: Diagnosis not present

## 2022-08-13 DIAGNOSIS — N281 Cyst of kidney, acquired: Secondary | ICD-10-CM | POA: Diagnosis not present

## 2022-08-13 DIAGNOSIS — J439 Emphysema, unspecified: Secondary | ICD-10-CM | POA: Diagnosis not present

## 2022-08-13 DIAGNOSIS — Z9181 History of falling: Secondary | ICD-10-CM | POA: Diagnosis not present

## 2022-08-13 DIAGNOSIS — J441 Chronic obstructive pulmonary disease with (acute) exacerbation: Secondary | ICD-10-CM | POA: Diagnosis not present

## 2022-08-13 DIAGNOSIS — N401 Enlarged prostate with lower urinary tract symptoms: Secondary | ICD-10-CM | POA: Diagnosis not present

## 2022-08-13 DIAGNOSIS — J9621 Acute and chronic respiratory failure with hypoxia: Secondary | ICD-10-CM | POA: Diagnosis not present

## 2022-08-14 ENCOUNTER — Telehealth: Payer: Self-pay

## 2022-08-14 ENCOUNTER — Telehealth: Payer: Self-pay | Admitting: *Deleted

## 2022-08-14 DIAGNOSIS — C7951 Secondary malignant neoplasm of bone: Secondary | ICD-10-CM | POA: Diagnosis not present

## 2022-08-14 DIAGNOSIS — J441 Chronic obstructive pulmonary disease with (acute) exacerbation: Secondary | ICD-10-CM | POA: Diagnosis not present

## 2022-08-14 DIAGNOSIS — N401 Enlarged prostate with lower urinary tract symptoms: Secondary | ICD-10-CM | POA: Diagnosis not present

## 2022-08-14 DIAGNOSIS — Z9981 Dependence on supplemental oxygen: Secondary | ICD-10-CM | POA: Diagnosis not present

## 2022-08-14 DIAGNOSIS — N281 Cyst of kidney, acquired: Secondary | ICD-10-CM | POA: Diagnosis not present

## 2022-08-14 DIAGNOSIS — Z466 Encounter for fitting and adjustment of urinary device: Secondary | ICD-10-CM | POA: Diagnosis not present

## 2022-08-14 DIAGNOSIS — Z8616 Personal history of COVID-19: Secondary | ICD-10-CM | POA: Diagnosis not present

## 2022-08-14 DIAGNOSIS — Z7951 Long term (current) use of inhaled steroids: Secondary | ICD-10-CM | POA: Diagnosis not present

## 2022-08-14 DIAGNOSIS — N138 Other obstructive and reflux uropathy: Secondary | ICD-10-CM | POA: Diagnosis not present

## 2022-08-14 DIAGNOSIS — J9621 Acute and chronic respiratory failure with hypoxia: Secondary | ICD-10-CM | POA: Diagnosis not present

## 2022-08-14 DIAGNOSIS — N179 Acute kidney failure, unspecified: Secondary | ICD-10-CM | POA: Diagnosis not present

## 2022-08-14 DIAGNOSIS — Z9181 History of falling: Secondary | ICD-10-CM | POA: Diagnosis not present

## 2022-08-14 DIAGNOSIS — Z7952 Long term (current) use of systemic steroids: Secondary | ICD-10-CM | POA: Diagnosis not present

## 2022-08-14 DIAGNOSIS — C61 Malignant neoplasm of prostate: Secondary | ICD-10-CM | POA: Diagnosis not present

## 2022-08-14 DIAGNOSIS — J439 Emphysema, unspecified: Secondary | ICD-10-CM | POA: Diagnosis not present

## 2022-08-14 NOTE — Transitions of Care (Post Inpatient/ED Visit) (Signed)
   08/14/2022  Name: Frank [name] MRN: [id number] DOB: 1956/01/02  Today's TOC FU Call Status: Today's TOC FU Call Status:: Successful TOC FU Call Competed TOC FU Call Complete Date: 08/14/22  Transition Care Management Follow-up Telephone Call Date of Discharge: 08/11/22 Discharge Facility: [facility]) Type of Discharge: Inpatient Admission Primary Inpatient Discharge Diagnosis:: Acute on chronic respiratory failure with hypoxia How have you been since you were released from the hospital?: Better Any questions or concerns?: No  Items Reviewed: Did you receive and understand the discharge instructions provided?: Yes Medications obtained and verified?: Yes (Medications Reviewed) Any new allergies since your discharge?: No Dietary orders reviewed?: No Do you have support at home?: Yes People in Home: sibling(s) Name of Support/Comfort Primary Source: [facility] and Equipment/Supplies: Were Home Health Services Ordered?: Yes Name of Home Health Agency:: [facility] Has Agency set up a time to come to your home?: Yes First Home Health Visit Date: 08/14/22 Any new equipment or medical supplies ordered?: No  Functional Questionnaire: Do you need assistance with bathing/showering or dressing?: No Do you need assistance with meal preparation?: No Do you need assistance with eating?: No Do you have difficulty maintaining continence: No Do you need assistance with getting out of bed/getting out of a chair/moving?: No Do you have difficulty managing or taking your medications?: No  Follow up appointments reviewed: PCP Follow-up appointment confirmed?: No (Patient asked this writer to contact his Sister as she transports him to appts.  Spoke with [name] who refused to schedule, she said she would call after she gets [facility] visit dates.) MD Provider Line Number:[phone] Given: No Specialist Hospital Follow-up appointment confirmed?: NA Do you need transportation to your  follow-up appointment?: No Do you understand care options if your condition(s) worsen?: Yes-patient verbalized understanding  SDOH Interventions Today    Flowsheet Row Most Recent Value  SDOH Interventions   Food Insecurity Interventions Intervention Not Indicated  Housing Interventions Intervention Not Indicated  Utilities Interventions Intervention Not Indicated      Jodelle Gross, RN, BSN, CCM Care Management Coordinator [place] Phone: [phone]/Fax: [phone]

## 2022-08-14 NOTE — Telephone Encounter (Signed)
Call from [facility] RN with [facility] - stated pt was discharged from the hospital for COPD exacerbation. Requesting verbal order for "Nursing 2 times a week x 1 week;once a week x 3 weeks for COPD monitoring/education, and medication teaching; also HH aide once a week x 6 weeks".  VO given - sending to red team for approval or denial. Thanks

## 2022-08-16 DIAGNOSIS — J9621 Acute and chronic respiratory failure with hypoxia: Secondary | ICD-10-CM | POA: Diagnosis not present

## 2022-08-16 DIAGNOSIS — N138 Other obstructive and reflux uropathy: Secondary | ICD-10-CM | POA: Diagnosis not present

## 2022-08-16 DIAGNOSIS — Z7952 Long term (current) use of systemic steroids: Secondary | ICD-10-CM | POA: Diagnosis not present

## 2022-08-16 DIAGNOSIS — C61 Malignant neoplasm of prostate: Secondary | ICD-10-CM | POA: Diagnosis not present

## 2022-08-16 DIAGNOSIS — J441 Chronic obstructive pulmonary disease with (acute) exacerbation: Secondary | ICD-10-CM | POA: Diagnosis not present

## 2022-08-16 DIAGNOSIS — Z8616 Personal history of COVID-19: Secondary | ICD-10-CM | POA: Diagnosis not present

## 2022-08-16 DIAGNOSIS — Z466 Encounter for fitting and adjustment of urinary device: Secondary | ICD-10-CM | POA: Diagnosis not present

## 2022-08-16 DIAGNOSIS — N401 Enlarged prostate with lower urinary tract symptoms: Secondary | ICD-10-CM | POA: Diagnosis not present

## 2022-08-16 DIAGNOSIS — J439 Emphysema, unspecified: Secondary | ICD-10-CM | POA: Diagnosis not present

## 2022-08-16 DIAGNOSIS — Z9981 Dependence on supplemental oxygen: Secondary | ICD-10-CM | POA: Diagnosis not present

## 2022-08-16 DIAGNOSIS — C7951 Secondary malignant neoplasm of bone: Secondary | ICD-10-CM | POA: Diagnosis not present

## 2022-08-16 DIAGNOSIS — N281 Cyst of kidney, acquired: Secondary | ICD-10-CM | POA: Diagnosis not present

## 2022-08-16 DIAGNOSIS — N179 Acute kidney failure, unspecified: Secondary | ICD-10-CM | POA: Diagnosis not present

## 2022-08-16 DIAGNOSIS — Z7951 Long term (current) use of inhaled steroids: Secondary | ICD-10-CM | POA: Diagnosis not present

## 2022-08-16 DIAGNOSIS — Z9181 History of falling: Secondary | ICD-10-CM | POA: Diagnosis not present

## 2022-08-17 DIAGNOSIS — C7951 Secondary malignant neoplasm of bone: Secondary | ICD-10-CM | POA: Diagnosis not present

## 2022-08-17 DIAGNOSIS — N138 Other obstructive and reflux uropathy: Secondary | ICD-10-CM | POA: Diagnosis not present

## 2022-08-17 DIAGNOSIS — Z9981 Dependence on supplemental oxygen: Secondary | ICD-10-CM | POA: Diagnosis not present

## 2022-08-17 DIAGNOSIS — N281 Cyst of kidney, acquired: Secondary | ICD-10-CM | POA: Diagnosis not present

## 2022-08-17 DIAGNOSIS — Z7951 Long term (current) use of inhaled steroids: Secondary | ICD-10-CM | POA: Diagnosis not present

## 2022-08-17 DIAGNOSIS — Z466 Encounter for fitting and adjustment of urinary device: Secondary | ICD-10-CM | POA: Diagnosis not present

## 2022-08-17 DIAGNOSIS — N401 Enlarged prostate with lower urinary tract symptoms: Secondary | ICD-10-CM | POA: Diagnosis not present

## 2022-08-17 DIAGNOSIS — C61 Malignant neoplasm of prostate: Secondary | ICD-10-CM | POA: Diagnosis not present

## 2022-08-17 DIAGNOSIS — Z7952 Long term (current) use of systemic steroids: Secondary | ICD-10-CM | POA: Diagnosis not present

## 2022-08-17 DIAGNOSIS — Z9181 History of falling: Secondary | ICD-10-CM | POA: Diagnosis not present

## 2022-08-17 DIAGNOSIS — J9621 Acute and chronic respiratory failure with hypoxia: Secondary | ICD-10-CM | POA: Diagnosis not present

## 2022-08-17 DIAGNOSIS — N179 Acute kidney failure, unspecified: Secondary | ICD-10-CM | POA: Diagnosis not present

## 2022-08-17 DIAGNOSIS — J439 Emphysema, unspecified: Secondary | ICD-10-CM | POA: Diagnosis not present

## 2022-08-17 DIAGNOSIS — J441 Chronic obstructive pulmonary disease with (acute) exacerbation: Secondary | ICD-10-CM | POA: Diagnosis not present

## 2022-08-17 DIAGNOSIS — Z8616 Personal history of COVID-19: Secondary | ICD-10-CM | POA: Diagnosis not present

## 2022-08-18 ENCOUNTER — Telehealth: Payer: Self-pay | Admitting: Emergency Medicine

## 2022-08-18 DIAGNOSIS — N179 Acute kidney failure, unspecified: Secondary | ICD-10-CM | POA: Diagnosis not present

## 2022-08-18 DIAGNOSIS — J9621 Acute and chronic respiratory failure with hypoxia: Secondary | ICD-10-CM | POA: Diagnosis not present

## 2022-08-18 DIAGNOSIS — C7951 Secondary malignant neoplasm of bone: Secondary | ICD-10-CM | POA: Diagnosis not present

## 2022-08-18 DIAGNOSIS — Z466 Encounter for fitting and adjustment of urinary device: Secondary | ICD-10-CM | POA: Diagnosis not present

## 2022-08-18 DIAGNOSIS — J441 Chronic obstructive pulmonary disease with (acute) exacerbation: Secondary | ICD-10-CM | POA: Diagnosis not present

## 2022-08-18 DIAGNOSIS — Z9981 Dependence on supplemental oxygen: Secondary | ICD-10-CM | POA: Diagnosis not present

## 2022-08-18 DIAGNOSIS — Z8616 Personal history of COVID-19: Secondary | ICD-10-CM | POA: Diagnosis not present

## 2022-08-18 DIAGNOSIS — J439 Emphysema, unspecified: Secondary | ICD-10-CM | POA: Diagnosis not present

## 2022-08-18 DIAGNOSIS — Z9181 History of falling: Secondary | ICD-10-CM | POA: Diagnosis not present

## 2022-08-18 DIAGNOSIS — N138 Other obstructive and reflux uropathy: Secondary | ICD-10-CM | POA: Diagnosis not present

## 2022-08-18 DIAGNOSIS — N401 Enlarged prostate with lower urinary tract symptoms: Secondary | ICD-10-CM | POA: Diagnosis not present

## 2022-08-18 DIAGNOSIS — C61 Malignant neoplasm of prostate: Secondary | ICD-10-CM | POA: Diagnosis not present

## 2022-08-18 DIAGNOSIS — Z7952 Long term (current) use of systemic steroids: Secondary | ICD-10-CM | POA: Diagnosis not present

## 2022-08-18 DIAGNOSIS — N281 Cyst of kidney, acquired: Secondary | ICD-10-CM | POA: Diagnosis not present

## 2022-08-18 DIAGNOSIS — Z7951 Long term (current) use of inhaled steroids: Secondary | ICD-10-CM | POA: Diagnosis not present

## 2022-08-18 NOTE — Telephone Encounter (Signed)
Called and spoke with [place] with [facility].  He states that the patient has been using his nebulizer machine and his HR as been as high as 148 with exertion.  His oxygen saturations on 4L of oxygen drop to 80 and then come back to 92-93%.  He states the patient does not have any cough, fever or chills.  Advised I would call and speak with the patient as well.  Called and spoke with patient, he states he does cough at times and it is a dry cough.  He is more sob than what is normal for him.  His oxygen level was 90-91% at rest.  HR 115 at rest.  States BP is ok.  Patient states he has not noticed his HR up to 148.  He does not feel his heart is racing or pounding.  Using nebulizer (duoneb) every 6 hours.  He is using Trelegy inhaler and is on Prednisone 20 mg (taper) for 2 more days.  Then he will go back to 10 mg.  Denies any fever, chills or body aches.  He is not currently on any antibiotics.  Scheduled patient for OV on 09/06/2022 at 1:30 pm with Ames Dura NP as he is overdue for an OV.  Dr. Delton Coombes, please advise.  Thank you.  No Known Allergies    Assessment & Plan Note by Leslye Peer, MD at 07/26/2021 4:11 PM  Author: Leslye Peer, MD Author Type: Physician Filed: 07/26/2021  4:11 PM  Note Status: Written Cosign: Cosign Not Required Encounter Date: 07/26/2021  Problem: Allergic rhinitis  Editor: Leslye Peer, MD (Physician)             He has not been using his fluticasone nasal spray every day.  He has dry cough, posterior pharyngeal mucus.  He needs to be on the nasal steroid at least during the allergy season.  We discussed this today.        Assessment & Plan Note by Leslye Peer, MD at 07/26/2021 4:10 PM  Author: Leslye Peer, MD Author Type: Physician Filed: 07/26/2021  4:11 PM  Note Status: Written Cosign: Cosign Not Required Encounter Date: 07/26/2021  Problem: COPD (chronic obstructive pulmonary disease) [facility])  Editor: Leslye Peer, MD (Physician)              Disease on chronic prednisone, chronic oxygen.  Recent flare seems to have responded to prednisone.  Current dose 10 mg daily.  Plan to continue same regimen   Please continue Trelegy 1 inhalation once daily.  Rinse and gargle after using. Keep your albuterol available to use 2 puffs when needed for shortness of breath, chest tightness, wheezing. Continue prednisone 10 mg once daily Flu shot and COVID-19 vaccine are both up-to-date Continue your oxygen at 4 L/min Start using your fluticasone nasal spray 2 sprays each nostril every day during the spring allergy season. Follow with Dr Delton Coombes in 6 months or sooner if you have any problems        Patient Instructions by Leslye Peer, MD at 07/26/2021 3:45 PM  Author: Leslye Peer, MD Author Type: Physician Filed: 07/26/2021  4:04 PM  Note Status: Addendum Sebastian Ache: Cosign Not Required Encounter Date: 07/26/2021  Editor: Leslye Peer, MD (Physician)      Prior Versions: 1. Leslye Peer, MD (Physician) at 07/26/2021  4:04 PM - Signed  Please continue Trelegy 1 inhalation once daily.  Rinse and gargle after using. Keep your albuterol available to  use 2 puffs when needed for shortness of breath, chest tightness, wheezing. Continue prednisone 10 mg once daily Flu shot and COVID-19 vaccine are both up-to-date Continue your oxygen at 4 L/min Start using your fluticasone nasal spray 2 sprays each nostril every day during the spring allergy season. Follow with Dr Delton Coombes in 6 months or sooner if you have any problems         Orthostatic Vitals Recorded in This Encounter   07/26/2021 1540     Patient Position: Sitting  BP Location: Left Arm  Cuff Size: Normal   Instructions  Please continue Trelegy 1 inhalation once daily.  Rinse and gargle after using. Keep your albuterol available to use 2 puffs when needed for shortness of breath, chest tightness, wheezing. Continue prednisone 10 mg once daily Flu shot and COVID-19 vaccine are  both up-to-date Continue your oxygen at 4 L/min Start using your fluticasone nasal spray 2 sprays each nostril every day during the spring allergy season. Follow with Dr Delton Coombes in 6 months or sooner if you have any problems

## 2022-08-18 NOTE — Telephone Encounter (Signed)
Frank [name] states patient has rapid heartrate. Having symptom of shortness of breath. Using Nebulizer machine. Frank [name] phone number is [phone].

## 2022-08-18 NOTE — Telephone Encounter (Signed)
Agree with this plan. Nothing to add. If he declines in any way then he will have to be seen sooner in UC or ED

## 2022-08-18 NOTE — Telephone Encounter (Signed)
Spoke to pt and reiterated what Dr Delton Coombes stated pt verbalized understanding. Nothing further needed.

## 2022-08-21 DIAGNOSIS — N401 Enlarged prostate with lower urinary tract symptoms: Secondary | ICD-10-CM | POA: Diagnosis not present

## 2022-08-21 DIAGNOSIS — Z466 Encounter for fitting and adjustment of urinary device: Secondary | ICD-10-CM | POA: Diagnosis not present

## 2022-08-21 DIAGNOSIS — N179 Acute kidney failure, unspecified: Secondary | ICD-10-CM | POA: Diagnosis not present

## 2022-08-21 DIAGNOSIS — J9621 Acute and chronic respiratory failure with hypoxia: Secondary | ICD-10-CM | POA: Diagnosis not present

## 2022-08-21 DIAGNOSIS — C7951 Secondary malignant neoplasm of bone: Secondary | ICD-10-CM | POA: Diagnosis not present

## 2022-08-21 DIAGNOSIS — Z7952 Long term (current) use of systemic steroids: Secondary | ICD-10-CM | POA: Diagnosis not present

## 2022-08-21 DIAGNOSIS — Z7951 Long term (current) use of inhaled steroids: Secondary | ICD-10-CM | POA: Diagnosis not present

## 2022-08-21 DIAGNOSIS — Z9981 Dependence on supplemental oxygen: Secondary | ICD-10-CM | POA: Diagnosis not present

## 2022-08-21 DIAGNOSIS — N138 Other obstructive and reflux uropathy: Secondary | ICD-10-CM | POA: Diagnosis not present

## 2022-08-21 DIAGNOSIS — Z8616 Personal history of COVID-19: Secondary | ICD-10-CM | POA: Diagnosis not present

## 2022-08-21 DIAGNOSIS — Z9181 History of falling: Secondary | ICD-10-CM | POA: Diagnosis not present

## 2022-08-21 DIAGNOSIS — C61 Malignant neoplasm of prostate: Secondary | ICD-10-CM | POA: Diagnosis not present

## 2022-08-21 DIAGNOSIS — J439 Emphysema, unspecified: Secondary | ICD-10-CM | POA: Diagnosis not present

## 2022-08-21 DIAGNOSIS — N281 Cyst of kidney, acquired: Secondary | ICD-10-CM | POA: Diagnosis not present

## 2022-08-21 DIAGNOSIS — J441 Chronic obstructive pulmonary disease with (acute) exacerbation: Secondary | ICD-10-CM | POA: Diagnosis not present

## 2022-08-23 DIAGNOSIS — Z8616 Personal history of COVID-19: Secondary | ICD-10-CM | POA: Diagnosis not present

## 2022-08-23 DIAGNOSIS — J439 Emphysema, unspecified: Secondary | ICD-10-CM | POA: Diagnosis not present

## 2022-08-23 DIAGNOSIS — J441 Chronic obstructive pulmonary disease with (acute) exacerbation: Secondary | ICD-10-CM | POA: Diagnosis not present

## 2022-08-23 DIAGNOSIS — Z7952 Long term (current) use of systemic steroids: Secondary | ICD-10-CM | POA: Diagnosis not present

## 2022-08-23 DIAGNOSIS — N138 Other obstructive and reflux uropathy: Secondary | ICD-10-CM | POA: Diagnosis not present

## 2022-08-23 DIAGNOSIS — C7951 Secondary malignant neoplasm of bone: Secondary | ICD-10-CM | POA: Diagnosis not present

## 2022-08-23 DIAGNOSIS — N179 Acute kidney failure, unspecified: Secondary | ICD-10-CM | POA: Diagnosis not present

## 2022-08-23 DIAGNOSIS — C61 Malignant neoplasm of prostate: Secondary | ICD-10-CM | POA: Diagnosis not present

## 2022-08-23 DIAGNOSIS — Z7951 Long term (current) use of inhaled steroids: Secondary | ICD-10-CM | POA: Diagnosis not present

## 2022-08-23 DIAGNOSIS — N401 Enlarged prostate with lower urinary tract symptoms: Secondary | ICD-10-CM | POA: Diagnosis not present

## 2022-08-23 DIAGNOSIS — Z466 Encounter for fitting and adjustment of urinary device: Secondary | ICD-10-CM | POA: Diagnosis not present

## 2022-08-23 DIAGNOSIS — N281 Cyst of kidney, acquired: Secondary | ICD-10-CM | POA: Diagnosis not present

## 2022-08-23 DIAGNOSIS — Z9981 Dependence on supplemental oxygen: Secondary | ICD-10-CM | POA: Diagnosis not present

## 2022-08-23 DIAGNOSIS — J9621 Acute and chronic respiratory failure with hypoxia: Secondary | ICD-10-CM | POA: Diagnosis not present

## 2022-08-23 DIAGNOSIS — Z9181 History of falling: Secondary | ICD-10-CM | POA: Diagnosis not present

## 2022-08-24 DIAGNOSIS — C7951 Secondary malignant neoplasm of bone: Secondary | ICD-10-CM | POA: Diagnosis not present

## 2022-08-24 DIAGNOSIS — N281 Cyst of kidney, acquired: Secondary | ICD-10-CM | POA: Diagnosis not present

## 2022-08-24 DIAGNOSIS — Z8616 Personal history of COVID-19: Secondary | ICD-10-CM | POA: Diagnosis not present

## 2022-08-24 DIAGNOSIS — Z466 Encounter for fitting and adjustment of urinary device: Secondary | ICD-10-CM | POA: Diagnosis not present

## 2022-08-24 DIAGNOSIS — J439 Emphysema, unspecified: Secondary | ICD-10-CM | POA: Diagnosis not present

## 2022-08-24 DIAGNOSIS — J441 Chronic obstructive pulmonary disease with (acute) exacerbation: Secondary | ICD-10-CM | POA: Diagnosis not present

## 2022-08-24 DIAGNOSIS — J9621 Acute and chronic respiratory failure with hypoxia: Secondary | ICD-10-CM | POA: Diagnosis not present

## 2022-08-24 DIAGNOSIS — C61 Malignant neoplasm of prostate: Secondary | ICD-10-CM | POA: Diagnosis not present

## 2022-08-24 DIAGNOSIS — N138 Other obstructive and reflux uropathy: Secondary | ICD-10-CM | POA: Diagnosis not present

## 2022-08-24 DIAGNOSIS — Z7952 Long term (current) use of systemic steroids: Secondary | ICD-10-CM | POA: Diagnosis not present

## 2022-08-24 DIAGNOSIS — Z7951 Long term (current) use of inhaled steroids: Secondary | ICD-10-CM | POA: Diagnosis not present

## 2022-08-24 DIAGNOSIS — Z9981 Dependence on supplemental oxygen: Secondary | ICD-10-CM | POA: Diagnosis not present

## 2022-08-24 DIAGNOSIS — N179 Acute kidney failure, unspecified: Secondary | ICD-10-CM | POA: Diagnosis not present

## 2022-08-24 DIAGNOSIS — N401 Enlarged prostate with lower urinary tract symptoms: Secondary | ICD-10-CM | POA: Diagnosis not present

## 2022-08-24 DIAGNOSIS — Z9181 History of falling: Secondary | ICD-10-CM | POA: Diagnosis not present

## 2022-08-28 DIAGNOSIS — Z7951 Long term (current) use of inhaled steroids: Secondary | ICD-10-CM | POA: Diagnosis not present

## 2022-08-28 DIAGNOSIS — J9621 Acute and chronic respiratory failure with hypoxia: Secondary | ICD-10-CM | POA: Diagnosis not present

## 2022-08-28 DIAGNOSIS — N281 Cyst of kidney, acquired: Secondary | ICD-10-CM | POA: Diagnosis not present

## 2022-08-28 DIAGNOSIS — N138 Other obstructive and reflux uropathy: Secondary | ICD-10-CM | POA: Diagnosis not present

## 2022-08-28 DIAGNOSIS — J441 Chronic obstructive pulmonary disease with (acute) exacerbation: Secondary | ICD-10-CM | POA: Diagnosis not present

## 2022-08-28 DIAGNOSIS — Z466 Encounter for fitting and adjustment of urinary device: Secondary | ICD-10-CM | POA: Diagnosis not present

## 2022-08-28 DIAGNOSIS — Z7952 Long term (current) use of systemic steroids: Secondary | ICD-10-CM | POA: Diagnosis not present

## 2022-08-28 DIAGNOSIS — Z9181 History of falling: Secondary | ICD-10-CM | POA: Diagnosis not present

## 2022-08-28 DIAGNOSIS — Z9981 Dependence on supplemental oxygen: Secondary | ICD-10-CM | POA: Diagnosis not present

## 2022-08-28 DIAGNOSIS — C61 Malignant neoplasm of prostate: Secondary | ICD-10-CM | POA: Diagnosis not present

## 2022-08-28 DIAGNOSIS — J439 Emphysema, unspecified: Secondary | ICD-10-CM | POA: Diagnosis not present

## 2022-08-28 DIAGNOSIS — N179 Acute kidney failure, unspecified: Secondary | ICD-10-CM | POA: Diagnosis not present

## 2022-08-28 DIAGNOSIS — Z8616 Personal history of COVID-19: Secondary | ICD-10-CM | POA: Diagnosis not present

## 2022-08-28 DIAGNOSIS — N401 Enlarged prostate with lower urinary tract symptoms: Secondary | ICD-10-CM | POA: Diagnosis not present

## 2022-08-28 DIAGNOSIS — C7951 Secondary malignant neoplasm of bone: Secondary | ICD-10-CM | POA: Diagnosis not present

## 2022-08-29 ENCOUNTER — Other Ambulatory Visit: Payer: Self-pay | Admitting: Emergency Medicine

## 2022-08-31 DIAGNOSIS — C61 Malignant neoplasm of prostate: Secondary | ICD-10-CM | POA: Diagnosis not present

## 2022-08-31 DIAGNOSIS — R338 Other retention of urine: Secondary | ICD-10-CM | POA: Diagnosis not present

## 2022-08-31 DIAGNOSIS — N202 Calculus of kidney with calculus of ureter: Secondary | ICD-10-CM | POA: Diagnosis not present

## 2022-08-31 DIAGNOSIS — C7951 Secondary malignant neoplasm of bone: Secondary | ICD-10-CM | POA: Diagnosis not present

## 2022-09-01 ENCOUNTER — Other Ambulatory Visit: Payer: Self-pay | Admitting: Urology

## 2022-09-01 DIAGNOSIS — J441 Chronic obstructive pulmonary disease with (acute) exacerbation: Secondary | ICD-10-CM | POA: Diagnosis not present

## 2022-09-01 DIAGNOSIS — J439 Emphysema, unspecified: Secondary | ICD-10-CM | POA: Diagnosis not present

## 2022-09-01 DIAGNOSIS — C7951 Secondary malignant neoplasm of bone: Secondary | ICD-10-CM | POA: Diagnosis not present

## 2022-09-01 DIAGNOSIS — Z466 Encounter for fitting and adjustment of urinary device: Secondary | ICD-10-CM | POA: Diagnosis not present

## 2022-09-01 DIAGNOSIS — N281 Cyst of kidney, acquired: Secondary | ICD-10-CM | POA: Diagnosis not present

## 2022-09-01 DIAGNOSIS — Z8616 Personal history of COVID-19: Secondary | ICD-10-CM | POA: Diagnosis not present

## 2022-09-01 DIAGNOSIS — C61 Malignant neoplasm of prostate: Secondary | ICD-10-CM | POA: Diagnosis not present

## 2022-09-01 DIAGNOSIS — N179 Acute kidney failure, unspecified: Secondary | ICD-10-CM | POA: Diagnosis not present

## 2022-09-01 DIAGNOSIS — J9621 Acute and chronic respiratory failure with hypoxia: Secondary | ICD-10-CM | POA: Diagnosis not present

## 2022-09-01 DIAGNOSIS — Z9181 History of falling: Secondary | ICD-10-CM | POA: Diagnosis not present

## 2022-09-01 DIAGNOSIS — Z9981 Dependence on supplemental oxygen: Secondary | ICD-10-CM | POA: Diagnosis not present

## 2022-09-01 DIAGNOSIS — Z7952 Long term (current) use of systemic steroids: Secondary | ICD-10-CM | POA: Diagnosis not present

## 2022-09-01 DIAGNOSIS — N138 Other obstructive and reflux uropathy: Secondary | ICD-10-CM | POA: Diagnosis not present

## 2022-09-01 DIAGNOSIS — Z7951 Long term (current) use of inhaled steroids: Secondary | ICD-10-CM | POA: Diagnosis not present

## 2022-09-01 DIAGNOSIS — N401 Enlarged prostate with lower urinary tract symptoms: Secondary | ICD-10-CM | POA: Diagnosis not present

## 2022-09-02 DIAGNOSIS — J449 Chronic obstructive pulmonary disease, unspecified: Secondary | ICD-10-CM | POA: Diagnosis not present

## 2022-09-04 NOTE — Patient Instructions (Signed)
SURGICAL WAITING ROOM VISITATION  Patients having surgery or a procedure may have no more than 2 support people in the waiting area - these visitors may rotate.    Children under the age of 79 must have an adult with them who is not the patient.  Due to an increase in RSV and influenza rates and associated hospitalizations, children ages 53 and under may not visit patients in [facility] hospitals.  If the patient needs to stay at the hospital during part of their recovery, the visitor guidelines for inpatient rooms apply. Pre-op nurse will coordinate an appropriate time for 1 support person to accompany patient in pre-op.  This support person may not rotate.    Please refer to the [facility] website for the visitor guidelines for Inpatients (after your surgery is over and you are in a regular room).       Your procedure is scheduled on:  09/08/2022    Report to [facility] Main Entrance    Report to admitting at   100 pm    Call this number if you have problems the morning of surgery [phone]   Do not eat food :After Midnight.                             If you have questions, please contact your surgeon's office.     Oral Hygiene is also important to reduce your risk of infection.                                    Remember - BRUSH YOUR TEETH THE MORNING OF SURGERY WITH YOUR REGULAR TOOTHPASTE  DENTURES WILL BE REMOVED PRIOR TO SURGERY PLEASE DO NOT APPLY "Poly grip" OR ADHESIVES!!!   Do NOT smoke after Midnight   Take these medicines the morning of surgery with A SIP OF WATER:  nebulizer if needed, inhalers as usual and bring, flonase, claritin, flomax   DO NOT TAKE ANY ORAL DIABETIC MEDICATIONS DAY OF YOUR SURGERY  Bring CPAP mask and tubing day of surgery.                              You may not have any metal on your body including hair pins, jewelry, and body piercing             Do not wear make-up, lotions, powders, perfumes/cologne, or  deodorant  Do not wear nail polish including gel and S&S, artificial/acrylic nails, or any other type of covering on natural nails including finger and toenails. If you have artificial nails, gel coating, etc. that needs to be removed by a nail salon please have this removed prior to surgery or surgery may need to be canceled/ delayed if the surgeon/ anesthesia feels like they are unable to be safely monitored.   Do not shave  48 hours prior to surgery.               Men may shave face and neck.   Do not bring valuables to the hospital. [facility] IS NOT             RESPONSIBLE   FOR VALUABLES.   Contacts, glasses, dentures or bridgework may not be worn into surgery.   Bring small overnight bag day of surgery.   DO NOT BRING YOUR  [place]. PHARMACY WILL DISPENSE MEDICATIONS LISTED ON YOUR MEDICATION LIST TO YOU DURING YOUR ADMISSION [place]!    Patients discharged on the day of surgery will not be allowed to drive home.  Someone NEEDS to stay with you for the first 24 hours after anesthesia.   Special Instructions: Bring a copy of your healthcare power of attorney and living will documents the day of surgery if you haven't scanned them before.              Please read over the following fact sheets you were given: IF [place] [phone]   If you received a COVID test during your pre-op visit  it is requested that you wear a mask when out in public, stay away from anyone that may not be feeling well and notify your surgeon if you develop symptoms. If you test positive for Covid or have been in contact with anyone that has tested positive in the last 10 days please notify you surgeon.    [facility] - Preparing for Surgery Before surgery, you can play an important role.  Because skin is not sterile, your skin needs to be as free of germs as possible.  You can reduce the number of germs on your skin by  washing with CHG (chlorahexidine gluconate) soap before surgery.  CHG is an antiseptic cleaner which kills germs and bonds with the skin to continue killing germs even after washing. Please DO NOT use if you have an allergy to CHG or antibacterial soaps.  If your skin becomes reddened/irritated stop using the CHG and inform your nurse when you arrive at Short Stay. Do not shave (including legs and underarms) for at least 48 hours prior to the first CHG shower.  You may shave your face/neck. Please follow these instructions carefully:  1.  Shower with CHG Soap the night before surgery and the  morning of Surgery.  2.  If you choose to wash your hair, wash your hair first as usual with your  normal  shampoo.  3.  After you shampoo, rinse your hair and body thoroughly to remove the  shampoo.                           4.  Use CHG as you would any other liquid soap.  You can apply chg directly  to the skin and wash                       Gently with a scrungie or clean washcloth.  5.  Apply the CHG Soap to your body ONLY FROM THE NECK DOWN.   Do not use on face/ open                           Wound or open sores. Avoid contact with eyes, ears mouth and genitals (private parts).                       Wash face,  Genitals (private parts) with your normal soap.             6.  Wash thoroughly, paying special attention to the area where your surgery  will be performed.  7.  Thoroughly rinse your body with warm water from the neck down.  8.  DO NOT shower/wash  with your normal soap after using and rinsing off  the CHG Soap.                9.  Pat yourself dry with a clean towel.            10.  Wear clean pajamas.            11.  Place clean sheets on your bed the night of your first shower and do not  sleep with pets. Day of Surgery : Do not apply any lotions/deodorants the morning of surgery.  Please wear clean clothes to the hospital/surgery center.  FAILURE TO FOLLOW THESE INSTRUCTIONS MAY RESULT IN THE  CANCELLATION OF YOUR SURGERY PATIENT SIGNATURE_________________________________  NURSE SIGNATURE__________________________________  ________________________________________________________________________

## 2022-09-04 NOTE — Progress Notes (Signed)
Anesthesia Review:  PCP: Crissie Sickles 07/17/22 LOV  Cardiologist : Pulm- 08/18/22- Shortness of breath - telephone visit  Chest x-ray : 08/05/22- 1 view  EKG : 08/04/22  Echo : Stress test: Cardiac Cath :  Activity level:  Sleep Study/ CPAP : Fasting Blood Sugar :      / Checks Blood Sugar -- times a day:   Blood Thinner/ Instructions /Last Dose: ASA / Instructions/ Last Dose :    08/09/22- cbc and BMP  08/04/22- ED to Admit Urinary Retention

## 2022-09-05 ENCOUNTER — Encounter (HOSPITAL_COMMUNITY)
Admission: RE | Admit: 2022-09-05 | Discharge: 2022-09-05 | Disposition: A | Discharge status: Home / Self Care | Payer: Medicare Other | Source: Ambulatory Visit | Attending: Urology | Admitting: Urology

## 2022-09-05 ENCOUNTER — Other Ambulatory Visit: Payer: Self-pay

## 2022-09-05 ENCOUNTER — Encounter (HOSPITAL_COMMUNITY): Payer: Self-pay

## 2022-09-06 ENCOUNTER — Ambulatory Visit: Payer: Medicare Other | Admitting: Primary Care

## 2022-09-06 ENCOUNTER — Telehealth: Payer: Self-pay | Admitting: Primary Care

## 2022-09-06 DIAGNOSIS — N179 Acute kidney failure, unspecified: Secondary | ICD-10-CM | POA: Diagnosis not present

## 2022-09-06 DIAGNOSIS — J441 Chronic obstructive pulmonary disease with (acute) exacerbation: Secondary | ICD-10-CM | POA: Diagnosis not present

## 2022-09-06 DIAGNOSIS — N138 Other obstructive and reflux uropathy: Secondary | ICD-10-CM | POA: Diagnosis not present

## 2022-09-06 DIAGNOSIS — Z9181 History of falling: Secondary | ICD-10-CM | POA: Diagnosis not present

## 2022-09-06 DIAGNOSIS — N401 Enlarged prostate with lower urinary tract symptoms: Secondary | ICD-10-CM | POA: Diagnosis not present

## 2022-09-06 DIAGNOSIS — J439 Emphysema, unspecified: Secondary | ICD-10-CM | POA: Diagnosis not present

## 2022-09-06 DIAGNOSIS — Z466 Encounter for fitting and adjustment of urinary device: Secondary | ICD-10-CM | POA: Diagnosis not present

## 2022-09-06 DIAGNOSIS — Z7951 Long term (current) use of inhaled steroids: Secondary | ICD-10-CM | POA: Diagnosis not present

## 2022-09-06 DIAGNOSIS — C61 Malignant neoplasm of prostate: Secondary | ICD-10-CM | POA: Diagnosis not present

## 2022-09-06 DIAGNOSIS — Z7952 Long term (current) use of systemic steroids: Secondary | ICD-10-CM | POA: Diagnosis not present

## 2022-09-06 DIAGNOSIS — Z8616 Personal history of COVID-19: Secondary | ICD-10-CM | POA: Diagnosis not present

## 2022-09-06 DIAGNOSIS — Z9981 Dependence on supplemental oxygen: Secondary | ICD-10-CM | POA: Diagnosis not present

## 2022-09-06 DIAGNOSIS — C7951 Secondary malignant neoplasm of bone: Secondary | ICD-10-CM | POA: Diagnosis not present

## 2022-09-06 DIAGNOSIS — N281 Cyst of kidney, acquired: Secondary | ICD-10-CM | POA: Diagnosis not present

## 2022-09-06 DIAGNOSIS — J9621 Acute and chronic respiratory failure with hypoxia: Secondary | ICD-10-CM | POA: Diagnosis not present

## 2022-09-06 NOTE — Telephone Encounter (Signed)
Dr. Delton Coombes please advise Patient is scheduled to have TURP under spinal this Friday. Patient has not been seen in over a year I have tried to get him scheduled for clearance with [facility] she is the only provider with an opening tomorrow. Family states its to much a hassle with his oxygen for him to come in.  Dr. Delton Coombes can you please advise if patient needs to be seen for pulm clearance

## 2022-09-06 NOTE — Telephone Encounter (Signed)
Patient's relative is calling back and would like the nurse to call regarding the surgical clearance.  Please call to discuss further.

## 2022-09-06 NOTE — Telephone Encounter (Signed)
ATC X1 LVM for [facility] advised pt has been scheduled for clearance with TP tomorrow.

## 2022-09-06 NOTE — Telephone Encounter (Signed)
[facility] with [facility] [phone] calling for Surgical Clearance.   PT having surgery Friday. Surgeon is needing Pulm surgical clearance. They are fax'ing ppwk today. Can he get clearance by Fri or will they need to resched surgery.   If PT needs to be called call sister.   Pls call [place].

## 2022-09-06 NOTE — Telephone Encounter (Signed)
He does not need to come in. He is moderate to high risk for any surgical procedure. The fact that it is being done under spinal lowers his risk and is preferred. He can proceed with TURP as long as the benefits outweigh these risks and he is not having symptoms consistent with an acute flare of his COPD.

## 2022-09-06 NOTE — Telephone Encounter (Signed)
Clearance has been sent back to [facility]. NFN

## 2022-09-06 NOTE — Telephone Encounter (Signed)
Pt sister Jannifer Franklin is returning call back

## 2022-09-07 ENCOUNTER — Ambulatory Visit: Payer: Medicare Other | Admitting: Adult Health

## 2022-09-07 ENCOUNTER — Other Ambulatory Visit: Payer: Self-pay | Admitting: Urology

## 2022-09-07 NOTE — Progress Notes (Signed)
Anesthesia chart review   Case: [id number] Date/Time: 09/08/22 1445   Procedure: TRANSURETHRAL RESECTION OF THE PROSTATE (TURP) - 75 MINS   Anesthesia type: Spinal   Pre-op diagnosis: URINARY RETENTION, PROSTATE CANCER   Location: [facility]   Surgeons: Loletta Parish., MD       DISCUSSION: 67 year old former smoker with history of COPD, chronic hypoxemic respiratory failure will continue with 4 L O2 at home, prednisone 10 mg daily,, urinary retention, prostate cancer scheduled for above procedure 09/08/2022 with Dr. Sebastian Ache.  Patient with recent admission 3/29 to 08/11/2022 with COPD exacerbation.  Patient discharged with prednisone taper.  Acute renal failure secondary to obstructive uropathy during admission.  Creatinine is back to baseline.  Pulmonology clearance requested due to recent COPD exacerbation.  Per Dr. Delton Coombes, "He does not need to come in. He is moderate to high risk for any surgical procedure. The fact that it is being done under spinal lowers his risk and is preferred. He can proceed with TURP as long as the benefits outweigh these risks and he is not having symptoms consistent with an acute flare of his COPD." VS: There were no vitals taken for this visit.  PROVIDERS: Crissie Sickles, MD is PCP  Karna Christmas, MD is pulmonologist LABS: Labs reviewed: Acceptable for surgery. (all labs ordered are listed, but only abnormal results are displayed)  Labs Reviewed - No data to display   IMAGES:   EKG:   CV: Echo 06/06/2012  - Left ventricle: The cavity size was normal. Systolic    function was normal. The estimated ejection fraction was    in the range of 55% to 60%. Left ventricular diastolic    function parameters were normal.  - Pulmonary arteries: PA peak pressure: 44mm Hg (S).  Past Medical History:  Diagnosis Date   Arthritis    Cancer (HCC)    COPD (chronic obstructive pulmonary disease) (HCC) 06/05/2012   Dyspnea    Emphysema  06/07/2012   Heart murmur    hx of as baby   History of kidney stones    Hypoxemia 06/07/2012   Obstructive chronic bronchitis with exacerbation (HCC) 06/07/2012   Pneumonia    Tobacco abuse 06/05/2012    Past Surgical History:  Procedure Laterality Date   CATARACT EXTRACTION, BILATERAL     CYSTOSCOPY WITH LITHOLAPAXY N/A 01/13/2022   Procedure: CYSTOSCOPY WITH LITHOLAPAXY;  Surgeon: Sebastian Ache, MD;  Location: [facility];  Service: Urology;  Laterality: N/A;   CYSTOSCOPY/RETROGRADE/URETEROSCOPY/STONE EXTRACTION WITH BASKET Right 10/04/2016   Procedure: CYSTOSCOPY/RETROGRADE/URETEROSCOPY/STONE EXTRACTION WITH BASKET/ STENT;  Surgeon: Sebastian Ache, MD;  Location: [facility];  Service: Urology;  Laterality: Right;  With LASER   CYSTOSCOPY/URETEROSCOPY/HOLMIUM LASER/STENT PLACEMENT Right 01/13/2022   Procedure: CYSTOSCOPY/URETEROSCOPY/HOLMIUM LASER/STENT PLACEMENT;  Surgeon: Sebastian Ache, MD;  Location: [facility];  Service: Urology;  Laterality: Right;   EXTRACORPOREAL SHOCK WAVE LITHOTRIPSY Right 06/29/2016   Procedure: RIGHT EXTRACORPOREAL SHOCK WAVE LITHOTRIPSY (ESWL);  Surgeon: Crist Fat, MD;  Location: [facility];  Service: Urology;  Laterality: Right;    MEDICATIONS:  abiraterone acetate (ZYTIGA) 250 MG tablet   albuterol (ACCUNEB) 0.63 MG/3ML nebulizer solution   albuterol (VENTOLIN HFA) 108 (90 Base) MCG/ACT inhaler   Calcium Carb-Cholecalciferol (OYSTER SHELL CALCIUM/D3 PO)   cephALEXin (KEFLEX) 500 MG capsule   fluticasone (FLONASE) 50 MCG/ACT nasal spray   Fluticasone-Umeclidin-Vilant (TRELEGY ELLIPTA) 100-62.5-25 MCG/ACT AEPB   guaiFENesin (MUCINEX) 600 MG 12 hr tablet   ipratropium-albuterol (DUONEB) 0.5-2.5 (3) MG/3ML SOLN  loratadine (CLARITIN) 10 MG tablet   OXYGEN   predniSONE (DELTASONE) 20 MG tablet   tamsulosin (FLOMAX) 0.4 MG CAPS capsule   No current facility-administered medications for this encounter.    Jodell Cipro Ward, PA-C [facility]  [facility] [phone]

## 2022-09-07 NOTE — Anesthesia Preprocedure Evaluation (Addendum)
Anesthesia Evaluation  Patient identified by MRN, date of birth, ID band Patient awake    Reviewed: Allergy & Precautions, NPO status , Patient's Chart, lab work & pertinent test results  Airway Mallampati: II  TM Distance: >3 FB Neck ROM: Full    Dental no notable dental hx. (+) Poor Dentition, Chipped, Missing, Dental Advisory Given   Pulmonary shortness of breath, pneumonia, COPD,  COPD inhaler and oxygen dependent, former smoker   breath sounds clear to auscultation + decreased breath sounds      Cardiovascular negative cardio ROS Normal cardiovascular exam+ Valvular Problems/Murmurs  Rhythm:Regular Rate:Normal  Echo 06/06/2012  - Left ventricle: The cavity size was normal. Systolic    function was normal. The estimated ejection fraction was    in the range of 55% to 60%. Left ventricular diastolic    function parameters were normal.  - Pulmonary arteries: PA peak pressure: 44mm Hg (S).  Past Medical History:     Neuro/Psych negative neurological ROS  negative psych ROS   GI/Hepatic negative GI ROS, Neg liver ROS,,,  Endo/Other  negative endocrine ROS    Renal/GU negative Renal ROS  negative genitourinary   Musculoskeletal negative musculoskeletal ROS (+) Arthritis ,    Abdominal   Peds negative pediatric ROS (+)  Hematology negative hematology ROS (+)   Anesthesia Other Findings O2 sat 90% on 4LNC  Reproductive/Obstetrics negative OB ROS                             Anesthesia Physical Anesthesia Plan  ASA: 3  Anesthesia Plan: Spinal and MAC   Post-op Pain Management: Minimal or no pain anticipated   Induction: Intravenous  PONV Risk Score and Plan: 2 and Ondansetron, Dexamethasone and Treatment may vary due to age or medical condition  Airway Management Planned: LMA  Additional Equipment:   Intra-op Plan:   Post-operative Plan: Extubation in OR  Informed Consent: I  have reviewed the patients History and Physical, chart, labs and discussed the procedure including the risks, benefits and alternatives for the proposed anesthesia with the patient or authorized representative who has indicated his/her understanding and acceptance.     Dental advisory given  Plan Discussed with: CRNA and Surgeon  Anesthesia Plan Comments: (See PAT note 09/05/2022  DISCUSSION: 67 year old former smoker with history of COPD, chronic hypoxemic respiratory failure will continue with 4 L O2 at home, prednisone 10 mg daily,, urinary retention, prostate cancer scheduled for above procedure 09/08/2022 with Dr. Sebastian Ache.   Patient with recent admission 3/29 to 08/11/2022 with COPD exacerbation.  Patient discharged with prednisone taper.  Acute renal failure secondary to obstructive uropathy during admission.  Creatinine is back to baseline.   Pulmonology clearance requested due to recent COPD exacerbation.  Per Dr. Delton Coombes, "He does not need to come in. He is moderate to high risk for any surgical procedure. The fact that it is being done under spinal lowers his risk and is preferred. He can proceed with TURP as long as the benefits outweigh these risks and he is not having symptoms consistent with an acute flare of his COPD." )        Anesthesia Quick Evaluation

## 2022-09-08 ENCOUNTER — Encounter (HOSPITAL_COMMUNITY): Payer: Self-pay | Admitting: Urology

## 2022-09-08 ENCOUNTER — Inpatient Hospital Stay (HOSPITAL_COMMUNITY)
Admission: RE | Admit: 2022-09-08 | Discharge: 2022-09-13 | DRG: 713 | Disposition: A | Discharge status: Home Health | Payer: Medicare Other | Attending: Urology | Admitting: Urology

## 2022-09-08 ENCOUNTER — Ambulatory Visit (HOSPITAL_COMMUNITY): Payer: Medicare Other | Admitting: Physician Assistant

## 2022-09-08 ENCOUNTER — Ambulatory Visit (HOSPITAL_BASED_OUTPATIENT_CLINIC_OR_DEPARTMENT_OTHER): Payer: Medicare Other | Admitting: Anesthesiology

## 2022-09-08 ENCOUNTER — Encounter (HOSPITAL_COMMUNITY)
Admission: RE | Disposition: A | Discharge status: Home Health | Payer: Self-pay | Source: Home / Self Care | Attending: Urology

## 2022-09-08 DIAGNOSIS — Z87891 Personal history of nicotine dependence: Secondary | ICD-10-CM | POA: Diagnosis not present

## 2022-09-08 DIAGNOSIS — J189 Pneumonia, unspecified organism: Secondary | ICD-10-CM | POA: Diagnosis not present

## 2022-09-08 DIAGNOSIS — J431 Panlobular emphysema: Secondary | ICD-10-CM

## 2022-09-08 DIAGNOSIS — C7951 Secondary malignant neoplasm of bone: Secondary | ICD-10-CM | POA: Diagnosis not present

## 2022-09-08 DIAGNOSIS — Z7951 Long term (current) use of inhaled steroids: Secondary | ICD-10-CM

## 2022-09-08 DIAGNOSIS — Z79899 Other long term (current) drug therapy: Secondary | ICD-10-CM

## 2022-09-08 DIAGNOSIS — N401 Enlarged prostate with lower urinary tract symptoms: Secondary | ICD-10-CM | POA: Diagnosis present

## 2022-09-08 DIAGNOSIS — Z825 Family history of asthma and other chronic lower respiratory diseases: Secondary | ICD-10-CM | POA: Diagnosis not present

## 2022-09-08 DIAGNOSIS — R339 Retention of urine, unspecified: Principal | ICD-10-CM | POA: Diagnosis present

## 2022-09-08 DIAGNOSIS — Z8249 Family history of ischemic heart disease and other diseases of the circulatory system: Secondary | ICD-10-CM

## 2022-09-08 DIAGNOSIS — J90 Pleural effusion, not elsewhere classified: Secondary | ICD-10-CM | POA: Diagnosis not present

## 2022-09-08 DIAGNOSIS — Z9981 Dependence on supplemental oxygen: Secondary | ICD-10-CM

## 2022-09-08 DIAGNOSIS — J441 Chronic obstructive pulmonary disease with (acute) exacerbation: Secondary | ICD-10-CM | POA: Diagnosis present

## 2022-09-08 DIAGNOSIS — J439 Emphysema, unspecified: Secondary | ICD-10-CM | POA: Diagnosis present

## 2022-09-08 DIAGNOSIS — J449 Chronic obstructive pulmonary disease, unspecified: Secondary | ICD-10-CM

## 2022-09-08 DIAGNOSIS — J9621 Acute and chronic respiratory failure with hypoxia: Secondary | ICD-10-CM | POA: Diagnosis not present

## 2022-09-08 DIAGNOSIS — R338 Other retention of urine: Secondary | ICD-10-CM | POA: Diagnosis not present

## 2022-09-08 DIAGNOSIS — Z7952 Long term (current) use of systemic steroids: Secondary | ICD-10-CM | POA: Diagnosis not present

## 2022-09-08 DIAGNOSIS — J9611 Chronic respiratory failure with hypoxia: Secondary | ICD-10-CM

## 2022-09-08 DIAGNOSIS — L89311 Pressure ulcer of right buttock, stage 1: Secondary | ICD-10-CM | POA: Diagnosis not present

## 2022-09-08 DIAGNOSIS — L89321 Pressure ulcer of left buttock, stage 1: Secondary | ICD-10-CM | POA: Diagnosis present

## 2022-09-08 DIAGNOSIS — J9811 Atelectasis: Secondary | ICD-10-CM | POA: Diagnosis not present

## 2022-09-08 DIAGNOSIS — C61 Malignant neoplasm of prostate: Principal | ICD-10-CM | POA: Diagnosis present

## 2022-09-08 DIAGNOSIS — R0602 Shortness of breath: Secondary | ICD-10-CM | POA: Diagnosis not present

## 2022-09-08 DIAGNOSIS — R059 Cough, unspecified: Secondary | ICD-10-CM | POA: Diagnosis not present

## 2022-09-08 HISTORY — PX: TRANSURETHRAL RESECTION OF PROSTATE: SHX73

## 2022-09-08 LAB — HEMOGLOBIN AND HEMATOCRIT, BLOOD
HCT: 33 % — ABNORMAL LOW (ref 39.0–52.0)
Hemoglobin: 9.9 g/dL — ABNORMAL LOW (ref 13.0–17.0)

## 2022-09-08 SURGERY — TURP (TRANSURETHRAL RESECTION OF PROSTATE)
Anesthesia: Spinal

## 2022-09-08 MED ORDER — FLUTICASONE FUROATE-VILANTEROL 100-25 MCG/ACT IN AEPB
1.0000 | INHALATION_SPRAY | Freq: Every day | RESPIRATORY_TRACT | Status: DC
  Administered 2022-09-09 – 2022-09-13 (×5): 1 via RESPIRATORY_TRACT
  Filled 2022-09-08: qty 28

## 2022-09-08 MED ORDER — PHENYLEPHRINE HCL (PRESSORS) 10 MG/ML IV SOLN
INTRAVENOUS | Status: AC
  Filled 2022-09-08: qty 1

## 2022-09-08 MED ORDER — CHLORHEXIDINE GLUCONATE CLOTH 2 % EX PADS
6.0000 | MEDICATED_PAD | Freq: Every day | CUTANEOUS | Status: DC
  Administered 2022-09-09 – 2022-09-13 (×5): 6 via TOPICAL

## 2022-09-08 MED ORDER — OXYCODONE HCL 5 MG PO TABS: 5.0000 mg | ORAL_TABLET | Freq: Once | ORAL | Status: DC | PRN

## 2022-09-08 MED ORDER — IPRATROPIUM-ALBUTEROL 0.5-2.5 (3) MG/3ML IN SOLN
RESPIRATORY_TRACT | Status: AC
  Filled 2022-09-08: qty 3

## 2022-09-08 MED ORDER — HYDROMORPHONE HCL 1 MG/ML IJ SOLN
0.5000 mg | INTRAMUSCULAR | Status: DC | PRN
  Administered 2022-09-11: 0.5 mg via INTRAVENOUS
  Administered 2022-09-11: 1 mg via INTRAVENOUS
  Filled 2022-09-08 (×2): qty 1

## 2022-09-08 MED ORDER — MIDAZOLAM HCL 2 MG/2ML IJ SOLN
INTRAMUSCULAR | Status: AC
  Filled 2022-09-08: qty 2

## 2022-09-08 MED ORDER — MEPERIDINE HCL 50 MG/ML IJ SOLN: 6.2500 mg | INTRAMUSCULAR | Status: DC | PRN

## 2022-09-08 MED ORDER — ACETAMINOPHEN 325 MG PO TABS: 325.0000 mg | ORAL_TABLET | ORAL | Status: DC | PRN

## 2022-09-08 MED ORDER — DEXAMETHASONE SODIUM PHOSPHATE 10 MG/ML IJ SOLN
INTRAMUSCULAR | Status: AC
  Filled 2022-09-08: qty 1

## 2022-09-08 MED ORDER — SODIUM CHLORIDE 0.9 % IR SOLN
3000.0000 mL | Status: DC
  Administered 2022-09-08: 3000 mL

## 2022-09-08 MED ORDER — ALBUTEROL SULFATE 0.63 MG/3ML IN NEBU: 1.0000 | INHALATION_SOLUTION | Freq: Four times a day (QID) | RESPIRATORY_TRACT | Status: DC

## 2022-09-08 MED ORDER — SODIUM CHLORIDE 0.9 % IV SOLN: INTRAVENOUS | Status: DC

## 2022-09-08 MED ORDER — ONDANSETRON HCL 4 MG/2ML IJ SOLN
INTRAMUSCULAR | Status: DC | PRN
  Administered 2022-09-08: 4 mg via INTRAVENOUS

## 2022-09-08 MED ORDER — OXYCODONE HCL 5 MG/5ML PO SOLN: 5.0000 mg | Freq: Once | ORAL | Status: DC | PRN

## 2022-09-08 MED ORDER — PHENYLEPHRINE HCL-NACL 20-0.9 MG/250ML-% IV SOLN
INTRAVENOUS | Status: DC | PRN
  Administered 2022-09-08: 40 ug/min via INTRAVENOUS

## 2022-09-08 MED ORDER — SODIUM CHLORIDE 0.9 % IR SOLN
Status: DC | PRN
  Administered 2022-09-08: 15000 mL

## 2022-09-08 MED ORDER — PREDNISONE 5 MG PO TABS
10.0000 mg | ORAL_TABLET | Freq: Every day | ORAL | Status: DC
  Administered 2022-09-09 – 2022-09-10 (×2): 10 mg via ORAL
  Filled 2022-09-08 (×2): qty 2

## 2022-09-08 MED ORDER — ONDANSETRON HCL 4 MG/2ML IJ SOLN
INTRAMUSCULAR | Status: AC
  Filled 2022-09-08: qty 2

## 2022-09-08 MED ORDER — IPRATROPIUM-ALBUTEROL 0.5-2.5 (3) MG/3ML IN SOLN
3.0000 mL | Freq: Four times a day (QID) | RESPIRATORY_TRACT | Status: DC
  Administered 2022-09-08 – 2022-09-12 (×14): 3 mL via RESPIRATORY_TRACT
  Filled 2022-09-08 (×14): qty 3

## 2022-09-08 MED ORDER — LACTATED RINGERS IV SOLN: INTRAVENOUS | Status: DC

## 2022-09-08 MED ORDER — PROPOFOL 10 MG/ML IV BOLUS
INTRAVENOUS | Status: DC | PRN
  Administered 2022-09-08: 70 ug/kg/min via INTRAVENOUS

## 2022-09-08 MED ORDER — ACETAMINOPHEN 160 MG/5ML PO SOLN: 325.0000 mg | ORAL | Status: DC | PRN

## 2022-09-08 MED ORDER — OXYCODONE HCL 5 MG PO TABS: 5.0000 mg | ORAL_TABLET | ORAL | Status: DC | PRN

## 2022-09-08 MED ORDER — PHENYLEPHRINE HCL (PRESSORS) 10 MG/ML IV SOLN
INTRAVENOUS | Status: DC | PRN
  Administered 2022-09-08 (×2): 80 ug via INTRAVENOUS

## 2022-09-08 MED ORDER — LIDOCAINE HCL (PF) 2 % IJ SOLN
INTRAMUSCULAR | Status: DC | PRN
  Administered 2022-09-08: 60 mg via INTRADERMAL

## 2022-09-08 MED ORDER — FLUTICASONE PROPIONATE 50 MCG/ACT NA SUSP
1.0000 | Freq: Every day | NASAL | Status: DC | PRN
  Administered 2022-09-09: 1 via NASAL
  Filled 2022-09-08: qty 16

## 2022-09-08 MED ORDER — ONDANSETRON HCL 4 MG/2ML IJ SOLN: 4.0000 mg | Freq: Once | INTRAMUSCULAR | Status: DC | PRN

## 2022-09-08 MED ORDER — PROPOFOL 500 MG/50ML IV EMUL
INTRAVENOUS | Status: AC
  Filled 2022-09-08: qty 50

## 2022-09-08 MED ORDER — MIDAZOLAM HCL 5 MG/5ML IJ SOLN
INTRAMUSCULAR | Status: DC | PRN
  Administered 2022-09-08: 1 mg via INTRAVENOUS

## 2022-09-08 MED ORDER — LIDOCAINE HCL (PF) 2 % IJ SOLN
INTRAMUSCULAR | Status: AC
  Filled 2022-09-08: qty 5

## 2022-09-08 MED ORDER — PROPOFOL 10 MG/ML IV BOLUS
INTRAVENOUS | Status: AC
  Filled 2022-09-08: qty 20

## 2022-09-08 MED ORDER — LORATADINE 10 MG PO TABS
10.0000 mg | ORAL_TABLET | Freq: Every day | ORAL | Status: DC
  Administered 2022-09-09 – 2022-09-13 (×5): 10 mg via ORAL
  Filled 2022-09-08 (×5): qty 1

## 2022-09-08 MED ORDER — TRAMADOL HCL 50 MG PO TABS: 50.0000 mg | ORAL_TABLET | Freq: Four times a day (QID) | ORAL | 0 refills | Status: DC | PRN

## 2022-09-08 MED ORDER — SENNOSIDES-DOCUSATE SODIUM 8.6-50 MG PO TABS
1.0000 | ORAL_TABLET | Freq: Two times a day (BID) | ORAL | Status: DC
  Administered 2022-09-08 – 2022-09-13 (×10): 1 via ORAL
  Filled 2022-09-08 (×10): qty 1

## 2022-09-08 MED ORDER — DEXAMETHASONE SODIUM PHOSPHATE 10 MG/ML IJ SOLN
INTRAMUSCULAR | Status: DC | PRN
  Administered 2022-09-08: 8 mg via INTRAVENOUS

## 2022-09-08 MED ORDER — EPHEDRINE 5 MG/ML INJ
INTRAVENOUS | Status: AC
  Filled 2022-09-08: qty 5

## 2022-09-08 MED ORDER — ACETAMINOPHEN 500 MG PO TABS
1000.0000 mg | ORAL_TABLET | Freq: Four times a day (QID) | ORAL | Status: AC
  Administered 2022-09-08 – 2022-09-09 (×4): 1000 mg via ORAL
  Filled 2022-09-08 (×4): qty 2

## 2022-09-08 MED ORDER — GENTAMICIN SULFATE 40 MG/ML IJ SOLN
5.0000 mg/kg | INTRAVENOUS | Status: AC
  Administered 2022-09-08: 318 mg via INTRAVENOUS
  Filled 2022-09-08: qty 8

## 2022-09-08 MED ORDER — GUAIFENESIN ER 600 MG PO TB12
600.0000 mg | ORAL_TABLET | Freq: Two times a day (BID) | ORAL | Status: DC
  Administered 2022-09-08 – 2022-09-13 (×10): 600 mg via ORAL
  Filled 2022-09-08 (×10): qty 1

## 2022-09-08 MED ORDER — 0.9 % SODIUM CHLORIDE (POUR BTL) OPTIME
TOPICAL | Status: DC | PRN
  Administered 2022-09-08: 1000 mL

## 2022-09-08 MED ORDER — BUPIVACAINE IN DEXTROSE 0.75-8.25 % IT SOLN
INTRATHECAL | Status: DC | PRN
  Administered 2022-09-08: 1.4 mL via INTRATHECAL

## 2022-09-08 MED ORDER — UMECLIDINIUM BROMIDE 62.5 MCG/ACT IN AEPB
1.0000 | INHALATION_SPRAY | Freq: Every day | RESPIRATORY_TRACT | Status: DC
  Administered 2022-09-09 – 2022-09-13 (×5): 1 via RESPIRATORY_TRACT
  Filled 2022-09-08: qty 7

## 2022-09-08 MED ORDER — EPHEDRINE SULFATE (PRESSORS) 50 MG/ML IJ SOLN
INTRAMUSCULAR | Status: DC | PRN
  Administered 2022-09-08 (×3): 10 mg via INTRAVENOUS

## 2022-09-08 MED ORDER — FENTANYL CITRATE PF 50 MCG/ML IJ SOSY: 25.0000 ug | PREFILLED_SYRINGE | INTRAMUSCULAR | Status: DC | PRN

## 2022-09-08 SURGICAL SUPPLY — 23 items
BAG DRN RND TRDRP ANRFLXCHMBR (UROLOGICAL SUPPLIES) ×1
BAG URINE DRAIN 2000ML AR STRL (UROLOGICAL SUPPLIES) ×1 IMPLANT
BAG URO CATCHER STRL LF (MISCELLANEOUS) ×1 IMPLANT
CATH FOLEY 3WAY 30CC 24FR (CATHETERS) ×1
CATH URTH STD 24FR FL 3W 2 (CATHETERS) IMPLANT
DRAPE FOOT SWITCH (DRAPES) ×1 IMPLANT
ELECT REM PT RETURN 15FT ADLT (MISCELLANEOUS) IMPLANT
GLOVE SURG LX STRL 7.5 STRW (GLOVE) ×1 IMPLANT
GOWN SRG XL LVL 4 BRTHBL STRL (GOWNS) ×1 IMPLANT
GOWN STRL NON-REIN XL LVL4 (GOWNS) ×1
GUIDEWIRE STR DUAL SENSOR (WIRE) IMPLANT
HOLDER FOLEY CATH W/STRAP (MISCELLANEOUS) IMPLANT
IV CATH 14GX2 1/4 (CATHETERS) IMPLANT
KIT TURNOVER KIT A (KITS) IMPLANT
LOOP CUT BIPOLAR 24F LRG (ELECTROSURGICAL) IMPLANT
MANIFOLD NEPTUNE II (INSTRUMENTS) ×1 IMPLANT
PACK CYSTO (CUSTOM PROCEDURE TRAY) ×1 IMPLANT
SYR 30ML LL (SYRINGE) ×1 IMPLANT
SYR TOOMEY IRRIG 70ML (MISCELLANEOUS) ×1
SYRINGE TOOMEY IRRIG 70ML (MISCELLANEOUS) ×1 IMPLANT
TUBING CONNECTING 10 (TUBING) ×1 IMPLANT
TUBING UROLOGY SET (TUBING) ×1 IMPLANT
WATER STERILE IRR 500ML POUR (IV SOLUTION) IMPLANT

## 2022-09-08 NOTE — Discharge Instructions (Signed)
1 - You may have urinary urgency (bladder spasms) and bloody urine on / off for up to 2 weeks. This is normal. ° °2 - Call MD or go to ER for fever >102, severe pain / nausea / vomiting not relieved by medications, or acute change in medical status ° °

## 2022-09-08 NOTE — H&P (Signed)
Frank [name] is an 67 y.o. male.    Chief Complaint: Pre-Op Transurethral Resection of Prostate  HPI:   1 - Recurrent Urolithiasis -  2018 - Rt SWL for ureteral stone  2023 - Rt ureteroscopy / cystolithalopexy for incidental 9mm Rt ureteral + 2.5cm bladder stone   2 - Metastatic Prostate Cancer -  Initial DX: - 12/12 cores Grade 5 cancer by BX 11/2021 on eval PSA 378. TRUS 39mL with loss of lateral fat planes, no median.  Primary TX: Androgen deprivation (metastatic at DX)  Most Recent Restaging: 12/2021 CT, Bone Scan - vertebral, sacral mets, no adenopathy   Recent Course:  12/2021 - Eligard 45; 04/2022 PSA 1.32, CMP Nl, T <10  07/2022 - PSA 1.03, T 13, CMP Nl ==> Eligard 45   3 - Lower Urinary Tract Symptoms / Urinary Retention - progressive bother form mostly obstructive symptoms. Placed on tamsulosin by PCP 08/2021 with good symptom result DRE 08/2021 "60gm". UA 08/2021 normal. PVR 08/2021 " " significant elevation, then frank retention, now catheter dependant.   4 - Bone Metastasis - multifocal, but not large volume prostate cancer bone mets 12/2021.  Dental Clearance: Pending, has fair dentition, not currently established.  Current Regimen: Vit D + Ca, path to [facility]   PMH sig for COPD / O2 (follow [name] pulm), no CV disease. 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 His [name] is very involved. He lives by himself but does not drive. His PCP is with [facility].   Today "Frank [name]" is seen to proceed with TURP unde spinal. Has pulm clearance.     Past Medical History:  Diagnosis Date   Arthritis    Cancer (HCC)    COPD (chronic obstructive pulmonary disease) (HCC) 06/05/2012   Dyspnea    Emphysema 06/07/2012   Heart murmur    hx of as baby   History of kidney stones    Hypoxemia 06/07/2012   Obstructive chronic bronchitis with exacerbation (HCC) 06/07/2012   Pneumonia    Tobacco abuse 06/05/2012    Past Surgical History:  Procedure Laterality Date    CATARACT EXTRACTION, BILATERAL     CYSTOSCOPY WITH LITHOLAPAXY N/A 01/13/2022   Procedure: CYSTOSCOPY WITH LITHOLAPAXY;  Surgeon: Sebastian Ache, MD;  [facility];  Service: Urology;  Laterality: N/A;   CYSTOSCOPY/RETROGRADE/URETEROSCOPY/STONE EXTRACTION WITH BASKET Right 10/04/2016   Procedure: CYSTOSCOPY/RETROGRADE/URETEROSCOPY/STONE EXTRACTION WITH BASKET/ STENT;  Surgeon: Sebastian Ache, MD;  Location[facility];  Service: Urology;  Laterality: Right;  With LASER   CYSTOSCOPY/URETEROSCOPY/HOLMIUM LASER/STENT PLACEMENT Right 01/13/2022   Procedure: CYSTOSCOPY/URETEROSCOPY/HOLMIUM LASER/STENT PLACEMENT;  Surgeon: Sebastian Ache, MD;  Location: [facility];  Service: Urology;  Laterality: Right;   EXTRACORPOREAL SHOCK WAVE LITHOTRIPSY Right 06/29/2016   Procedure: RIGHT EXTRACORPOREAL SHOCK WAVE LITHOTRIPSY (ESWL);  Surgeon: Crist Fat, MD;  Location: [facility];  Service: Urology;  Laterality: Right;    Family History  Problem Relation Age of Onset   COPD Mother    Colon polyps Father    Heart attack Father 1   Colon polyps Sister    Social History:  reports that he quit smoking about 7 years ago. His smoking use included cigarettes. He has a 17.50 pack-year smoking history. He quit smokeless tobacco use about 54 years ago.  His smokeless tobacco use included chew. He reports that he does not drink alcohol and does not use drugs.  Allergies: No Known Allergies  No medications prior to admission.    No results found for this or  any previous visit (from the past 48 hour(s)). No results found.  Review of Systems  Constitutional:  Negative for chills and fever.  Genitourinary:  Positive for urgency.  All other systems reviewed and are negative.   There were no vitals taken for this visit. Physical Exam Vitals reviewed.  HENT:     Head: Normocephalic.  Eyes:     Pupils: Pupils are equal, round, and reactive to light.  Cardiovascular:     Rate and Rhythm: Normal rate.   Pulmonary:     Effort: Pulmonary effort is normal.     Comments: On [place] O2 with baseline slightly increased WOB Abdominal:     General: Abdomen is flat.  Genitourinary:    Comments: Catheter in place with non-foul urine.  Musculoskeletal:        General: Normal range of motion.     Cervical back: Normal range of motion.  Skin:    General: Skin is warm.  Neurological:     General: No focal deficit present.     Mental Status: He is alert.  Psychiatric:        Mood and Affect: Mood normal.      Assessment/Plan  Proceed as planned with TURP under spinal. Risks (including pulm failure), benefits, alternatives, expected peri-op course discussed previously and reiterated today including overnight observation and home with cathter in AM if things well.   Loletta Parish., MD 09/08/2022, 9:18 AM

## 2022-09-08 NOTE — Anesthesia Procedure Notes (Signed)
Spinal  Patient location during procedure: OR Start time: 09/08/2022 3:40 PM End time: 09/08/2022 3:45 PM Reason for block: surgical anesthesia Staffing Performed: anesthesiologist  Anesthesiologist: Gaynelle Adu, MD Performed by: Gaynelle Adu, MD Authorized by: Gaynelle Adu, MD   Preanesthetic Checklist Completed: patient identified, IV checked, risks and benefits discussed, surgical consent, monitors and equipment checked, pre-op evaluation and timeout performed Spinal Block Patient position: sitting Prep: DuraPrep Patient monitoring: cardiac monitor, continuous pulse ox and blood pressure Approach: midline Location: L3-4 Injection technique: single-shot Needle Needle type: Pencan  Needle gauge: 24 G Needle length: 9 cm Assessment Sensory level: T8 Events: CSF return Additional Notes Functioning IV was confirmed and monitors were applied. Sterile prep and drape, including hand hygiene and sterile gloves were used. The patient was positioned and the spine was prepped. The skin was anesthetized with lidocaine.  Free flow of clear CSF was obtained prior to injecting local anesthetic into the CSF.  The spinal needle aspirated freely following injection.  The needle was carefully withdrawn.  The patient tolerated the procedure well.

## 2022-09-08 NOTE — Brief Op Note (Signed)
09/08/2022  4:29 PM  [name]:  Frank [name]  67 y.o. male  PRE-OPERATIVE DIAGNOSIS:  URINARY RETENTION, PROSTATE CANCER  POST-OPERATIVE DIAGNOSIS:  URINARY RETENTION, PROSTATE CANCER  PROCEDURE:  Procedure(s) with comments: TRANSURETHRAL RESECTION OF THE PROSTATE (TURP) (N/A) - 75 MINS  SURGEON:  Surgeon(s) and Role:    * Frank [name], Frank [name]., MD - Primary  PHYSICIAN ASSISTANT:   ASSISTANTS: none   ANESTHESIA:   general  EBL:  20 mL   BLOOD ADMINISTERED:none  DRAINS:  79F 3 way foley to NS irrigation    LOCAL MEDICATIONS USED:  NONE  SPECIMEN:  Source of Specimen:  prostate chips  DISPOSITION OF SPECIMEN:  PATHOLOGY  COUNTS:  YES  TOURNIQUET:  * No tourniquets in log *  DICTATION: .Other Dictation: Dictation Number [id number]  PLAN OF CARE: Admit for overnight observation  [name] DISPOSITION:  PACU - hemodynamically stable.   Delay start of Pharmacological VTE agent (>24hrs) due to surgical blood loss or risk of bleeding: yes

## 2022-09-08 NOTE — Transfer of Care (Signed)
Immediate Anesthesia Transfer of Care Note  Patient: Frank [name]  Procedure(s) Performed: TRANSURETHRAL RESECTION OF THE PROSTATE (TURP)  Patient Location: PACU  Anesthesia Type:Spinal  Level of Consciousness: awake, alert , oriented, and patient cooperative  Airway & Oxygen Therapy: Patient Spontanous Breathing and Patient connected to face mask oxygen  Post-op Assessment: Report given to RN and Post -op Vital signs reviewed and stable  Post vital signs: Reviewed and stable  Last Vitals:  Vitals Value Taken Time  BP 103/57 09/08/22 1640  Temp    Pulse 94 09/08/22 1644  Resp 19 09/08/22 1644  SpO2 99 % 09/08/22 1644  Vitals shown include unvalidated device data.  Last Pain:  Vitals:   09/08/22 1306  TempSrc: Oral  PainSc:       Patients Stated Pain Goal: 4 (09/08/22 1305)  Complications: No notable events documented.

## 2022-09-08 NOTE — Anesthesia Postprocedure Evaluation (Signed)
Anesthesia Post Note  Patient: GERREL SALMELA  Procedure(s) Performed: TRANSURETHRAL RESECTION OF THE PROSTATE (TURP)     Patient location during evaluation: PACU Anesthesia Type: Spinal Level of consciousness: oriented and awake and alert Pain management: pain level controlled Vital Signs Assessment: post-procedure vital signs reviewed and stable Respiratory status: spontaneous breathing, respiratory function stable and patient connected to nasal cannula oxygen Cardiovascular status: blood pressure returned to baseline and stable Postop Assessment: no headache, no backache, no apparent nausea or vomiting, spinal receding and patient able to bend at knees Anesthetic complications: no  No notable events documented.  Last Vitals:  Vitals:   09/08/22 1645 09/08/22 1700  BP: 106/62 119/62  Pulse: 94 97  Resp: 19 (!) 22  Temp: 36.5 C   SpO2: 99% 94%    Last Pain:  Vitals:   09/08/22 1645  TempSrc:   PainSc: Asleep                 Saladin Petrelli,W. EDMOND

## 2022-09-08 NOTE — Op Note (Unsigned)
NAME: Frank [name], Frank [name] MEDICAL RECORD NO: [id number] ACCOUNT NO: [id number] DATE OF BIRTH: 04-22-1956 FACILITY: [facility] LOCATION: [id number] PHYSICIAN: Sebastian Ache, MD  Operative Report   PREOPERATIVE DIAGNOSES:  Metastatic prostate cancer, urinary retention.  PROCEDURE:  Transurethral resection of the prostate.  ESTIMATED BLOOD LOSS:  50 mL.  COMPLICATIONS:  None.  SPECIMEN:  Prostate chips for permanent pathology and foundation testing.  FINDINGS: 1.  Moderate prostatic hypertrophy.  Overall, prostate size not impressively large. 2.  Wide open fossa from the verumontanum to the bladder neck following transurethral resection of the prostate.  DRAINS:  24-French 3-way Foley catheter to normal saline irrigation, 30 mL water in the balloon.  INDICATIONS:  The patient is a pleasant, but quite unfortunate 67 year old man with history of severe COPD, on chronic oxygen, who was found to have widely metastatic prostate cancer and evaluation of PSA in the 300s.  He has been on androgen deprivation  as well as____ inhibitor for a relatively short time with good PSA response.  He has, however, developed worsening obstructive voiding and now frank urinary retention despite these medical maneuvers ____ to catheter free.  Given his prostate volume,  which is less than 100 grams, I felt that he most reliably he would achieve this would be a transurethral resection.  He presents for this today under spinal.  This is felt at least potentially disruptive way to his pulmonary function.  Informed consent  was obtained and placed in medical record.  PROCEDURE IN DETAIL:  The patient being Frank [name] and the procedure being transurethral resection of prostate under spinal was confirmed.  Procedure timeout was performed.  Intravenous antibiotics were administered.  Spinal anesthesia was  carefully induced by anesthesia team.  The patient was placed into a low lithotomy position.  Sterile field was created,  prepped and draped the patient's penis, perineum, and proximal thighs using iodine and his in situ Foley catheter was removed.  Next,  cystourethroscopy was performed using a 26-French resectoscope sheath with visual obturator.  Inspection of anterior and posterior urethra revealed some moderate kissing lobe prostatic hypertrophy.  Overall, prostate volume was not massive. Urinary  bladder was unremarkable.  Bilateral single ureteral orifices.  No evidence of stones, diverticula or papillary lesions.  Next, using bipolar energy, resectoscope loop, very careful resection was performed in a top down fashion, first at 12 o'clock  position from the bladder neck to the verumontanum, then at the left lobe from 12 o'clock to 6 o'clock position down to superficial fibromuscular stroma of the prostatic capsule, and then over the right side.  Exquisite care was taken to avoid  undermining of the bladder neck while resecting distal to the verumontanum.  Prostate chips were irrigated and set aside for permanent pathology with foundation testing and the entire prostate fossa was fulgurated in a descending spiral type fashion,  which resulted in acceptable hemostasis.  A sensor wire was advanced through the resectoscope sheath and a 24-French 3-way Foley catheter was placed over this to minimize trauma to the bladder neck, 30 mL water in the balloon and placed on gentle  inferior traction, and normal saline irrigation and catheter strap traction.  The procedure was terminated.  The patient tolerated procedure well, no periprocedural complications.  The patient taken to postanesthesia care unit in stable condition.  Plan  for observation admission, likely discharge him tomorrow pending his clinical status and trial of void in elective setting in several days.   NIK D: 09/08/2022 4:34:57 pm T:  09/08/2022 10:41:00 pm  JOB: [id number]/ [id number]

## 2022-09-09 ENCOUNTER — Other Ambulatory Visit: Payer: Self-pay

## 2022-09-09 ENCOUNTER — Encounter (HOSPITAL_COMMUNITY): Payer: Self-pay | Admitting: Urology

## 2022-09-09 ENCOUNTER — Observation Stay (HOSPITAL_COMMUNITY): Payer: Medicare Other

## 2022-09-09 DIAGNOSIS — R339 Retention of urine, unspecified: Secondary | ICD-10-CM | POA: Diagnosis not present

## 2022-09-09 DIAGNOSIS — R0602 Shortness of breath: Secondary | ICD-10-CM | POA: Diagnosis not present

## 2022-09-09 DIAGNOSIS — R059 Cough, unspecified: Secondary | ICD-10-CM | POA: Diagnosis not present

## 2022-09-09 LAB — HEMOGLOBIN AND HEMATOCRIT, BLOOD
HCT: 30.4 % — ABNORMAL LOW (ref 39.0–52.0)
Hemoglobin: 9.4 g/dL — ABNORMAL LOW (ref 13.0–17.0)

## 2022-09-09 MED ORDER — METHYLPREDNISOLONE SODIUM SUCC 125 MG IJ SOLR
125.0000 mg | Freq: Two times a day (BID) | INTRAMUSCULAR | Status: DC
  Administered 2022-09-10 (×2): 125 mg via INTRAVENOUS
  Filled 2022-09-09 (×2): qty 2

## 2022-09-09 MED ORDER — IPRATROPIUM-ALBUTEROL 0.5-2.5 (3) MG/3ML IN SOLN: 3.0000 mL | Freq: Four times a day (QID) | RESPIRATORY_TRACT | Status: DC

## 2022-09-09 MED ORDER — SALINE SPRAY 0.65 % NA SOLN
1.0000 | NASAL | Status: DC | PRN
  Administered 2022-09-09: 1 via NASAL
  Filled 2022-09-09 (×2): qty 44

## 2022-09-09 MED ORDER — ACETAMINOPHEN 325 MG PO TABS
650.0000 mg | ORAL_TABLET | Freq: Four times a day (QID) | ORAL | Status: DC | PRN
  Administered 2022-09-09: 650 mg via ORAL
  Filled 2022-09-09: qty 2

## 2022-09-09 NOTE — Progress Notes (Signed)
Late entry. The patient suddenly became short of breath, while the writer was in the room. 02 saturation in the low 80's pt using purse lip breathing to help catch his breath. Baseline oxygen 4 L at home. Amy RT made aware pt placed on high- flow 02 at this time. Pt's 02 after high flow remained in the mid 90's. Resting in bed, respirations even and unlabored no distress noted on exam. Plan of care ongoing.

## 2022-09-09 NOTE — Progress Notes (Signed)
   09/09/22 0916  Oxygen Therapy/Pulse Ox  O2 Device Nasal Cannula  O2 Therapy Oxygen humidified  O2 Flow Rate (L/min) 4 L/min (Changed to a regular nasal cannula @ 4 L, baseline: home 02.)  FiO2 (%) 36 %  SpO2 90 %  Safety Instructions Yes (Comment)

## 2022-09-09 NOTE — Progress Notes (Signed)
   09/09/22 1028  Oxygen Therapy/Pulse Ox  O2 Device HFNC (Changed back to HFNC @ 4 L.)  O2 Therapy Oxygen humidified  O2 Flow Rate (L/min) 4 L/min  FiO2 (%) 36 %  SpO2 96 %  Safety Instructions Yes (Comment)

## 2022-09-09 NOTE — Progress Notes (Signed)
Stopped bladder irrigation; doctor plan to discharge patient

## 2022-09-09 NOTE — Consult Note (Signed)
Initial Consultation Note   Patient: Frank [name] ZOX:[id number] DOB: March 25, 1956 PCP: Crissie Sickles, MD DOA: 09/08/2022 DOS: the patient was seen and examined on 09/09/2022 Primary service: Loletta Parish., MD  Referring physician: Dr. Brett Fairy, Urology Reason for consult: Acute on chronic hypoxic respiratory failure  Assessment/Plan: Assessment and Plan:  #1 acute on chronic hypoxic respiratory failure: Patient appears to have COPD exacerbation.  He has known history of COPD.  We will get chest x-ray to rule out pneumonia.  Patient will resume breathing treatments.  Initiate nebulizer treatments.  Initiate Solu-Medrol 125 mg twice daily.  #2 status post TURP: Continue per urology.  #3 history of prostate cancer: Continue per urology.      TRH will continue to follow the patient.  HPI: Frank [name] is a 67 y.o. male with past medical history of patient is 67 year old gentleman that was admitted with urology for TURP.  He has known history of COPD with chronic respiratory failure on 4 L of oxygen at home.  Patient came in today with significant shortness of breath after surgery.  He is currently requiring 8 L of oxygen with oxygen sats between 92 to 96%.  Patient is visibly wheezing.  He is taking breathing treatments at home.  Currently has an inhaler that he has tried.  Medicine consulted for medical management.  Chest x-ray has been ordered and currently pending..  Review of Systems: As mentioned in the history of present illness. All other systems reviewed and are negative. Past Medical History:  Diagnosis Date   Arthritis    Cancer (HCC)    COPD (chronic obstructive pulmonary disease) (HCC) 06/05/2012   Dyspnea    Emphysema 06/07/2012   Heart murmur    hx of as baby   History of kidney stones    Hypoxemia 06/07/2012   Obstructive chronic bronchitis with exacerbation (HCC) 06/07/2012   Pneumonia    Tobacco abuse 06/05/2012   Past Surgical History:  Procedure  Laterality Date   CATARACT EXTRACTION, BILATERAL     CYSTOSCOPY WITH LITHOLAPAXY N/A 01/13/2022   Procedure: CYSTOSCOPY WITH LITHOLAPAXY;  Surgeon: Sebastian Ache, MD;  Location: [facility];  Service: Urology;  Laterality: N/A;   CYSTOSCOPY/RETROGRADE/URETEROSCOPY/STONE EXTRACTION WITH BASKET Right 10/04/2016   Procedure: CYSTOSCOPY/RETROGRADE/URETEROSCOPY/STONE EXTRACTION WITH BASKET/ STENT;  Surgeon: Sebastian Ache, MD;  Location: [facility];  Service: Urology;  Laterality: Right;  With LASER   CYSTOSCOPY/URETEROSCOPY/HOLMIUM LASER/STENT PLACEMENT Right 01/13/2022   Procedure: CYSTOSCOPY/URETEROSCOPY/HOLMIUM LASER/STENT PLACEMENT;  Surgeon: Sebastian Ache, MD;  Location: [facility];  Service: Urology;  Laterality: Right;   EXTRACORPOREAL SHOCK WAVE LITHOTRIPSY Right 06/29/2016   Procedure: RIGHT EXTRACORPOREAL SHOCK WAVE LITHOTRIPSY (ESWL);  Surgeon: Crist Fat, MD;  Location: [facility];  Service: Urology;  Laterality: Right;   TRANSURETHRAL RESECTION OF PROSTATE N/A 09/08/2022   Procedure: TRANSURETHRAL RESECTION OF THE PROSTATE (TURP);  Surgeon: Loletta Parish., MD;  Location: [facility];  Service: Urology;  Laterality: N/A;  60 MINS   Social History:  reports that he quit smoking about 7 years ago. His smoking use included cigarettes. He has a 17.50 pack-year smoking history. He quit smokeless tobacco use about 54 years ago.  His smokeless tobacco use included chew. He reports that he does not drink alcohol and does not use drugs.  No Known Allergies  Family History  Problem Relation Age of Onset   COPD Mother    Colon polyps Father    Heart attack Father 41   Colon polyps Sister  Prior to Admission medications   Medication Sig Start Date End Date Taking? Authorizing Provider  abiraterone acetate (ZYTIGA) 250 MG tablet Take 1,000 mg by mouth daily. 08/08/22  Yes [provider]  albuterol (ACCUNEB) 0.63 MG/3ML nebulizer solution USE 1 VIAL VIA NEBULIZER EVERY 6 HOURS Patient taking  differently: Take 1 ampule by nebulization every 6 (six) hours. COPD 08/29/22  Yes Leslye Peer, MD  Calcium Carb-Cholecalciferol (OYSTER SHELL CALCIUM/D3 PO) Take 1 tablet by mouth daily with breakfast. 600 mg   Yes [provider]  fluticasone (FLONASE) 50 MCG/ACT nasal spray Place 1 spray into both nostrils daily. Patient taking differently: Place 1 spray into both nostrils 2 (two) times daily. 08/10/20  Yes Leslye Peer, MD  Fluticasone-Umeclidin-Vilant (TRELEGY ELLIPTA) 100-62.5-25 MCG/ACT AEPB INHALE 1 PUFF INTO THE LUNGS DAILY Patient taking differently: Take 1 puff by mouth daily. 07/03/22  Yes Luciano Cutter, MD  guaiFENesin (MUCINEX) 600 MG 12 hr tablet Take 1 tablet (600 mg total) by mouth 2 (two) times daily. 08/11/22  Yes Regalado, Belkys A, MD  ipratropium-albuterol (DUONEB) 0.5-2.5 (3) MG/3ML SOLN Take 3 mLs by nebulization every 6 (six) hours. Patient taking differently: Take 3 mLs by nebulization every 6 (six) hours. COPD 08/11/22  Yes Regalado, Belkys A, MD  loratadine (CLARITIN) 10 MG tablet Take 1 tablet (10 mg total) by mouth daily. 08/12/22  Yes Regalado, Belkys A, MD  OXYGEN Inhale 4-5 L/min into the lungs continuous.   Yes [provider]  predniSONE (DELTASONE) 20 MG tablet Take 3 tablets for 3 days then 2 tablets for 3 days then 1 tablet for 3 days then resume home dose prednisone of 10 mg daily. Patient taking differently: Take 10 mg by mouth daily with breakfast. 08/11/22  Yes Regalado, Belkys A, MD  tamsulosin (FLOMAX) 0.4 MG CAPS capsule TAKE 1 CAPSULE(0.4 MG) BY MOUTH DAILY Patient taking differently: Take 0.4 mg by mouth daily. 02/17/22  Yes Adron Bene, MD  traMADol (ULTRAM) 50 MG tablet Take 1-2 tablets (50-100 mg total) by mouth every 6 (six) hours as needed for moderate pain or severe pain (post-operatively). 09/08/22 09/08/23 Yes Manny, Delbert Phenix., MD  albuterol (VENTOLIN HFA) 108 (90 Base) MCG/ACT inhaler INHALE 2 PUFFS BY MOUTH EVERY 4 HOURS  AS NEEDED FOR WHEEZING OR SHORTNESS OF BREATH Patient taking differently: Inhale 1 puff into the lungs every 4 (four) hours as needed for shortness of breath (COPD). 07/03/22   Luciano Cutter, MD  cephALEXin (KEFLEX) 500 MG capsule Take 500 mg by mouth 2 (two) times daily. 08/31/22   [provider]    Physical Exam: Vitals:   09/09/22 2004 09/09/22 2040 09/09/22 2044 09/09/22 2135  BP: 124/61     Pulse: 81     Resp: 19     Temp: (!) 100.5 F (38.1 C)     TempSrc: Oral     SpO2: 93% (!) 87% 92% 90%  Weight:      Height:       Constitutional: NAD, calm, comfortable Eyes: PERRL, lids and conjunctivae normal ENMT: Mucous membranes are moist. Posterior pharynx clear of any exudate or lesions.Normal dentition.  Neck: normal, supple, no masses, no thyromegaly Respiratory: Decreased air entry bilaterally with marked expiratory wheezing,, no crackles. Normal respiratory effort. No accessory muscle use.  Cardiovascular: Regular rate and rhythm, no murmurs / rubs / gallops. No extremity edema. 2+ pedal pulses. No carotid bruits.  Abdomen: no tenderness, no masses palpated. No hepatosplenomegaly. Bowel sounds positive.  Musculoskeletal: Good range of motion, no joint swelling or tenderness, Skin: no rashes, lesions, ulcers. No induration Neurologic: CN 2-12 grossly intact. Sensation intact, DTR normal. Strength 5/5 in all 4.  Psychiatric: Normal judgment and insight. Alert and oriented x 3. Normal mood  Data Reviewed:   There are no new results to review at this time.    Family Communication: No family at bedside Primary team communication: [phone] Thank you very much for involving [place] in the care of your patient.  AuthorLonia Blood, MD 09/09/2022 11:19 PM  For on call review [email or web address].

## 2022-09-09 NOTE — Progress Notes (Signed)
Patient oxygen dropped to 80% on 4 liters nasal canula; placed patient on nonrebreather at 10 liters and oxygen increased to 96%; informed respiratory and she placed patient back on high flow at 4 liters; patient oxygen maintained at 92%; notified doctor of patient status; doctor will not discharge patient today as planned

## 2022-09-09 NOTE — Progress Notes (Signed)
  Transition of Care [facility]) Screening Note   Patient Details  Name: Frank [name] Date of Birth: May 09, 1955   Transition of Care [facility]) CM/SW Contact:    Otelia Santee, LCSW Phone Number: 09/09/2022, 10:52 AM    Transition of Care Department [facility]) has reviewed patient and no TOC needs have been identified at this time. We will continue to monitor patient advancement through interdisciplinary progression rounds. If new patient transition needs arise, please place a TOC consult.

## 2022-09-09 NOTE — Progress Notes (Addendum)
1 Day Post-Op  Subjective: Frank [name] is doing well POD #1 from a TURP for retention and a history of metastatic prostate cancer.  He has no complaints and his urine is clear.  Hgb is down some but there is no active bleeding.  O2 sat is 91% on 7l [place] ROS:  Review of Systems  All other systems reviewed and are negative.   Anti-infectives: Anti-infectives (From admission, onward)    Start     Dose/Rate Route Frequency Ordered Stop   09/08/22 1303  gentamicin (GARAMYCIN) 320 mg in dextrose 5 % 100 mL IVPB        5 mg/kg  63.6 kg 108 mL/hr over 60 Minutes Intravenous 30 min pre-op 09/08/22 1303 09/08/22 1601       Current Facility-Administered Medications  Medication Dose Route Frequency Provider Last Rate Last Admin   0.9 %  sodium chloride infusion   Intravenous Continuous Loletta Parish., MD 20 mL/hr at 09/08/22 1851 New Bag at 09/08/22 1851   acetaminophen (TYLENOL) tablet 1,000 mg  1,000 mg Oral Q6H Loletta Parish., MD   1,000 mg at 09/09/22 0355   Chlorhexidine Gluconate Cloth 2 % PADS 6 each  6 each Topical Q0600 Loletta Parish., MD   6 each at 09/09/22 0657   fluticasone (FLONASE) 50 MCG/ACT nasal spray 1 spray  1 spray Each Nare Daily PRN Loletta Parish., MD       fluticasone furoate-vilanterol (BREO ELLIPTA) 100-25 MCG/ACT 1 puff  1 puff Inhalation Daily Loletta Parish., MD   1 puff at 09/09/22 1610   And   umeclidinium bromide (INCRUSE ELLIPTA) 62.5 MCG/ACT 1 puff  1 puff Inhalation Daily Loletta Parish., MD   1 puff at 09/09/22 9604   guaiFENesin (MUCINEX) 12 hr tablet 600 mg  600 mg Oral BID Loletta Parish., MD   600 mg at 09/08/22 2207   HYDROmorphone (DILAUDID) injection 0.5-1 mg  0.5-1 mg Intravenous Q2H PRN Loletta Parish., MD       ipratropium-albuterol (DUONEB) 0.5-2.5 (3) MG/3ML nebulizer solution 3 mL  3 mL Nebulization Q6H Loletta Parish., MD   3 mL at 09/09/22 0809   loratadine (CLARITIN) tablet 10 mg  10 mg Oral  Daily Loletta Parish., MD       oxyCODONE (Oxy IR/ROXICODONE) immediate release tablet 5 mg  5 mg Oral Q4H PRN Loletta Parish., MD       predniSONE (DELTASONE) tablet 10 mg  10 mg Oral Q breakfast Loletta Parish., MD       senna-docusate (Senokot-S) tablet 1 tablet  1 tablet Oral BID Loletta Parish., MD   1 tablet at 09/08/22 2207   sodium chloride irrigation 0.9 % 3,000 mL  3,000 mL Irrigation Continuous Loletta Parish., MD   3,000 mL at 09/08/22 2209     Objective: Vital signs in last 24 hours: Temp:  [97.6 F (36.4 C)-98.4 F (36.9 C)] 98 F (36.7 C) (05/04 5409) Pulse Rate:  [72-106] 72 (05/04 0613) Resp:  [13-22] 20 (05/04 0613) BP: (100-148)/(57-75) 101/62 (05/04 0613) SpO2:  [87 %-99 %] 91 % (05/04 0809) Weight:  [40.2 kg] 40.2 kg (05/03 1305)  Intake/Output from previous day: 05/03 0701 - 05/04 0700 In: 3807.3 [I.V.:600; IV Piggyback:107.3] Out: 5920 [Urine:5900; Blood:20] Intake/Output this shift: Total I/O In: 360 [P.O.:360] Out: -    Physical Exam Vitals reviewed.  Constitutional:  Appearance: Normal appearance.  Genitourinary:    Comments: Urine clear in foley tubing.  Neurological:     Mental Status: He is alert.     Lab Results:  Recent Labs    09/08/22 1707  HGB 9.9*  HCT 33.0*   BMET No results for input(s): "NA", "K", "CL", "CO2", "GLUCOSE", "BUN", "CREATININE", "CALCIUM" in the last 72 hours. PT/INR No results for input(s): "LABPROT", "INR" in the last 72 hours. ABG No results for input(s): "PHART", "HCO3" in the last 72 hours.  Invalid input(s): "PCO2", "PO2"  Studies/Results: No results found.   Assessment and Plan: Prostate cancer with retention.  He is doing well s/p TURP and his urine is clear on minimal CBI.  His Hgb is 9.9 but there is no active bleeding.  He will be discharged today after O2 weaned to baseline.        LOS: 0 days    Frank [name] 5/4/2024Patient ID: Frank [name], male    DOB: 09-17-1955, 67 y.o.   MRN: [id number]

## 2022-09-10 DIAGNOSIS — Z9981 Dependence on supplemental oxygen: Secondary | ICD-10-CM | POA: Diagnosis not present

## 2022-09-10 DIAGNOSIS — C61 Malignant neoplasm of prostate: Secondary | ICD-10-CM | POA: Diagnosis present

## 2022-09-10 DIAGNOSIS — Z79899 Other long term (current) drug therapy: Secondary | ICD-10-CM | POA: Diagnosis not present

## 2022-09-10 DIAGNOSIS — R339 Retention of urine, unspecified: Secondary | ICD-10-CM | POA: Diagnosis present

## 2022-09-10 DIAGNOSIS — N401 Enlarged prostate with lower urinary tract symptoms: Secondary | ICD-10-CM | POA: Diagnosis present

## 2022-09-10 DIAGNOSIS — Z7951 Long term (current) use of inhaled steroids: Secondary | ICD-10-CM | POA: Diagnosis not present

## 2022-09-10 DIAGNOSIS — J9621 Acute and chronic respiratory failure with hypoxia: Secondary | ICD-10-CM | POA: Diagnosis present

## 2022-09-10 DIAGNOSIS — J441 Chronic obstructive pulmonary disease with (acute) exacerbation: Secondary | ICD-10-CM | POA: Diagnosis present

## 2022-09-10 DIAGNOSIS — Z7952 Long term (current) use of systemic steroids: Secondary | ICD-10-CM | POA: Diagnosis not present

## 2022-09-10 DIAGNOSIS — Z87891 Personal history of nicotine dependence: Secondary | ICD-10-CM | POA: Diagnosis not present

## 2022-09-10 DIAGNOSIS — L89321 Pressure ulcer of left buttock, stage 1: Secondary | ICD-10-CM | POA: Diagnosis present

## 2022-09-10 DIAGNOSIS — L89311 Pressure ulcer of right buttock, stage 1: Secondary | ICD-10-CM | POA: Diagnosis present

## 2022-09-10 DIAGNOSIS — R338 Other retention of urine: Secondary | ICD-10-CM | POA: Diagnosis present

## 2022-09-10 DIAGNOSIS — J9611 Chronic respiratory failure with hypoxia: Secondary | ICD-10-CM | POA: Diagnosis not present

## 2022-09-10 DIAGNOSIS — Z8249 Family history of ischemic heart disease and other diseases of the circulatory system: Secondary | ICD-10-CM | POA: Diagnosis not present

## 2022-09-10 DIAGNOSIS — Z825 Family history of asthma and other chronic lower respiratory diseases: Secondary | ICD-10-CM | POA: Diagnosis not present

## 2022-09-10 DIAGNOSIS — J439 Emphysema, unspecified: Secondary | ICD-10-CM | POA: Diagnosis present

## 2022-09-10 DIAGNOSIS — C7951 Secondary malignant neoplasm of bone: Secondary | ICD-10-CM | POA: Diagnosis present

## 2022-09-10 LAB — BASIC METABOLIC PANEL
Anion gap: 9 (ref 5–15)
BUN: 25 mg/dL — ABNORMAL HIGH (ref 8–23)
CO2: 34 mmol/L — ABNORMAL HIGH (ref 22–32)
Calcium: 8.7 mg/dL — ABNORMAL LOW (ref 8.9–10.3)
Chloride: 94 mmol/L — ABNORMAL LOW (ref 98–111)
Creatinine, Ser: 0.7 mg/dL (ref 0.61–1.24)
GFR, Estimated: >60 mL/min (ref >60)
Glucose, Bld: 237 mg/dL — ABNORMAL HIGH (ref 70–99)
Potassium: 3.9 mmol/L (ref 3.5–5.1)
Sodium: 137 mmol/L (ref 135–145)

## 2022-09-10 LAB — MAGNESIUM: Magnesium: 2 mg/dL (ref 1.7–2.4)

## 2022-09-10 LAB — PHOSPHORUS: Phosphorus: 2.6 mg/dL (ref 2.5–4.6)

## 2022-09-10 MED ORDER — PREDNISONE 20 MG PO TABS: 20.0000 mg | ORAL_TABLET | Freq: Every day | ORAL | Status: DC

## 2022-09-10 MED ORDER — METHYLPREDNISOLONE SODIUM SUCC 40 MG IJ SOLR
40.0000 mg | Freq: Every day | INTRAMUSCULAR | Status: AC
  Administered 2022-09-12: 40 mg via INTRAVENOUS
  Filled 2022-09-10: qty 1

## 2022-09-10 MED ORDER — PREDNISONE 20 MG PO TABS
40.0000 mg | ORAL_TABLET | Freq: Every day | ORAL | Status: DC
  Administered 2022-09-13: 40 mg via ORAL
  Filled 2022-09-10: qty 2

## 2022-09-10 MED ORDER — METHYLPREDNISOLONE SODIUM SUCC 125 MG IJ SOLR: 125.0000 mg | Freq: Two times a day (BID) | INTRAMUSCULAR | Status: DC

## 2022-09-10 MED ORDER — PREDNISONE 5 MG PO TABS: 10.0000 mg | ORAL_TABLET | Freq: Every day | ORAL | Status: DC

## 2022-09-10 MED ORDER — BENZONATATE 100 MG PO CAPS
100.0000 mg | ORAL_CAPSULE | Freq: Once | ORAL | Status: AC
  Administered 2022-09-10: 100 mg via ORAL
  Filled 2022-09-10: qty 1

## 2022-09-10 MED ORDER — PREDNISONE 5 MG PO TABS: 30.0000 mg | ORAL_TABLET | Freq: Every day | ORAL | Status: DC

## 2022-09-10 MED ORDER — METHYLPREDNISOLONE SODIUM SUCC 125 MG IJ SOLR
125.0000 mg | Freq: Two times a day (BID) | INTRAMUSCULAR | Status: AC
  Administered 2022-09-10: 125 mg via INTRAVENOUS
  Filled 2022-09-10: qty 2

## 2022-09-10 MED ORDER — ENOXAPARIN SODIUM 40 MG/0.4ML IJ SOSY
40.0000 mg | PREFILLED_SYRINGE | Freq: Every evening | INTRAMUSCULAR | Status: DC
  Administered 2022-09-10: 40 mg via SUBCUTANEOUS
  Filled 2022-09-10: qty 0.4

## 2022-09-10 MED ORDER — IPRATROPIUM-ALBUTEROL 0.5-2.5 (3) MG/3ML IN SOLN
3.0000 mL | Freq: Four times a day (QID) | RESPIRATORY_TRACT | Status: DC | PRN
  Administered 2022-09-10: 3 mL via RESPIRATORY_TRACT

## 2022-09-10 MED ORDER — METHYLPREDNISOLONE SODIUM SUCC 40 MG IJ SOLR
40.0000 mg | Freq: Two times a day (BID) | INTRAMUSCULAR | Status: AC
  Administered 2022-09-11 (×2): 40 mg via INTRAVENOUS
  Filled 2022-09-10 (×2): qty 1

## 2022-09-10 MED ORDER — ENSURE ENLIVE PO LIQD
237.0000 mL | Freq: Three times a day (TID) | ORAL | Status: DC
  Administered 2022-09-10 – 2022-09-12 (×7): 237 mL via ORAL

## 2022-09-10 NOTE — Progress Notes (Signed)
Patient was on 4 liters nasal canula with oxygen saturation at 89%; patient stood up to sit in chair and oxygen saturation decreased to 79%; increased nasal canula to 10 liters and sat patient down in chair; patient maintained oxygen saturation at 83% for a few minutes; decreased patient nasal canula to 6 liters and patient maintained 90%; currently patient is on 6 liters nasal canula and oxygen saturation is 84% while sitting in chair; notified physician

## 2022-09-10 NOTE — TOC Transition Note (Signed)
Transition of Care [facility]) - CM/SW Discharge Note   Patient Details  Name: Frank [name] MRN: [id number] Date of Birth: 1956-05-07  Transition of Care [facility]) CM/SW Contact:  Lanier Clam, RN Phone Number: 09/10/2022, 9:43 AM   Clinical Narrative: Active w/[facility] home 02-has travel tank. Has own transport home. No further CM needs.      Final next level of care: Home/Self Care Barriers to Discharge: No Barriers Identified   Patient Goals and CMS Choice      Discharge Placement                         Discharge Plan and Services Additional resources added to the After Visit Summary for     Discharge Planning Services: CM Consult                                 Social Determinants of Health (SDOH) Interventions SDOH Screenings   Food Insecurity: No Food Insecurity (09/09/2022)  Housing: Low Risk  (09/09/2022)  Transportation Needs: No Transportation Needs (09/09/2022)  Recent Concern: Transportation Needs - Unmet Transportation Needs (08/08/2022)  Utilities: Not At Risk (09/09/2022)  Alcohol Screen: Low Risk  (07/17/2022)  Depression (PHQ2-9): Low Risk  (07/17/2022)  Financial Resource Strain: Low Risk  (07/17/2022)  Physical Activity: Inactive (07/17/2022)  Social Connections: Socially Isolated (07/17/2022)  Stress: No Stress Concern Present (07/17/2022)  Tobacco Use: Medium Risk (09/09/2022)     Readmission Risk Interventions    08/08/2022   10:30 AM  Readmission Risk Prevention Plan  Transportation Screening Complete  PCP or Specialist Appt within 5-7 Days Complete  Home Care Screening Complete  Medication Review (RN CM) Complete

## 2022-09-10 NOTE — Progress Notes (Signed)
   09/10/22 0843  Oxygen Therapy/Pulse Ox  O2 Device Nasal Cannula  O2 Therapy (S)  Oxygen humidified (Weaned to regular nasal cannula, pulse ox low alarm set at 88%.)  O2 Flow Rate (L/min) 4 L/min  FiO2 (%) 36 %  SpO2 92 %  Safety Instructions Yes (Comment)

## 2022-09-10 NOTE — Progress Notes (Signed)
[facility] Progress Note  Patient: Frank [name]    WJX:[id number]  DOA: 09/08/2022     Date of Service: the patient was seen and examined on 09/10/2022  No chief complaint on file.  Brief hospital course: Frank [name] is a 67 y.o. male with past medical history of patient is 67 year old gentleman that was admitted with urology for TURP.  He has known history of COPD with chronic respiratory failure on 4 L of oxygen at home.  Patient came in today with significant shortness of breath after surgery.  He is currently requiring 8 L of oxygen with oxygen sats between 92 to 96%.  Patient is visibly wheezing.  He is taking breathing treatments at home.  Currently has an inhaler that he has tried.  Medicine consulted for medical management.  Chest x-ray No active disease.     Assessment and Plan: #1 acute on chronic hypoxic respiratory failure: Patient appears to have COPD exacerbation.  He has known history of COPD.  CXR no active disease. Continue Solu-Medrol 125 mg every 12 hourly, taper gradually tomorrow and transition to oral prednisone. Continued Breo Ellipta inhaler, Incruse Ellipta inhaler, DuoNeb every 6 hourly scheduled.  Continue Mucinex 600 twice daily, Claritin 10 mg p.o. daily Patient spiked fever 100.5 last night around 8 PM, continue to monitor temperature curve, order blood culture if patient spikes fever again. Check renal functions and we will do CTA chest to rule out PE    #2 status post TURP: Continue per urology.   #3 history of prostate cancer: Continue per urology.   Body mass index is 14.3 kg/m.  Interventions:    Pressure Injury 08/04/22 Buttocks Right;Left Stage 1 -  Intact skin with non-blanchable redness of a localized area usually over a bony prominence. (Active)  08/04/22 1840  Location: Buttocks  Location Orientation: Right;Left  Staging: Stage 1 -  Intact skin with non-blanchable redness of a localized area usually over a bony prominence.  Wound  Description (Comments):   Present on Admission:      Diet: Regular diet with Ensure supplemental protein DVT Prophylaxis: Subcutaneous Lovenox   Advance goals of care discussion: Full code  Family Communication: family was present at bedside, at the time of interview.  The pt provided permission to discuss medical plan with the family. Opportunity was given to ask question and all questions were answered satisfactorily.   Disposition:  Pt is from Home, admitted with urinary retention, Foley catheter inserted.  Respiratory failure, still has respiratory failure, which precludes a safe discharge. Discharge to home, when clinically stable.  Subjective: No significant events overnight, patient is still very short of breath and O2 sats drop with movement to very short distance.  Patient denies any chest pain or palpitations, no any other active issues.  Physical Exam: General: NAD, lying comfortably Appear in no distress, affect appropriate Eyes: PERRLA ENT: Oral Mucosa Clear, moist  Neck: no JVD,  Cardiovascular: S1 and S2 Present, no Murmur,  Respiratory: good respiratory effort, Bilateral Air entry equal and Decreased due to emphysema, no Crackles, no wheezes Abdomen: Bowel Sound present, Soft and no tenderness,  Skin: no rashes Extremities: no Pedal edema, no calf tenderness Neurologic: without any new focal findings Gait not checked due to patient safety concerns  Vitals:   09/10/22 0843 09/10/22 1000 09/10/22 1034 09/10/22 1257  BP:    130/68  Pulse:    93  Resp:    16  Temp:    98.1 F (36.7 C)  TempSrc:    Oral  SpO2: 92% (!) 84% (!) 89% 92%  Weight:      Height:        Intake/Output Summary (Last 24 hours) at 09/10/2022 1450 Last data filed at 09/10/2022 1410 Gross per 24 hour  Intake 1080 ml  Output 1950 ml  Net -870 ml   Filed Weights   09/08/22 1305  Weight: 40.2 kg    Data Reviewed: I have personally reviewed and interpreted daily labs, tele strips,  imagings as discussed above. I reviewed all nursing notes, pharmacy notes, vitals, pertinent old records I have discussed plan of care as described above with RN and patient/family.  CBC: Recent Labs  Lab 09/08/22 1707 09/09/22 1556  HGB 9.9* 9.4*  HCT 33.0* 30.4*   Basic Metabolic Panel: No results for input(s): "NA", "K", "CL", "CO2", "GLUCOSE", "BUN", "CREATININE", "CALCIUM", "MG", "PHOS" in the last 168 hours.  Studies: DG Chest Port 1 View  Result Date: 09/09/2022 CLINICAL DATA:  Oxygen desaturation.  Shortness of breath, cough EXAM: PORTABLE CHEST 1 VIEW COMPARISON:  08/05/2022 FINDINGS: Heart and mediastinal contours within normal limits. Aortic atherosclerosis. Stable mild hyperinflation. No confluent opacities or effusions. No acute bony abnormality. IMPRESSION: No active disease. Electronically Signed   By: Charlett Nose M.D.   On: 09/09/2022 23:09    Scheduled Meds:  Chlorhexidine Gluconate Cloth  6 each Topical Q0600   fluticasone furoate-vilanterol  1 puff Inhalation Daily   And   umeclidinium bromide  1 puff Inhalation Daily   guaiFENesin  600 mg Oral BID   ipratropium-albuterol  3 mL Nebulization Q6H   loratadine  10 mg Oral Daily   methylPREDNISolone (SOLU-MEDROL) injection  125 mg Intravenous Q12H   [START ON 09/11/2022] methylPREDNISolone (SOLU-MEDROL) injection  40 mg Intravenous Q12H   Followed by   Melene Muller ON 09/12/2022] methylPREDNISolone (SOLU-MEDROL) injection  40 mg Intravenous Daily   Followed by   Melene Muller ON 09/13/2022] predniSONE  40 mg Oral Q breakfast   Followed by   Melene Muller ON 09/15/2022] predniSONE  30 mg Oral Q breakfast   Followed by   Melene Muller ON 09/18/2022] predniSONE  20 mg Oral Q breakfast   Followed by   Melene Muller ON 09/21/2022] predniSONE  10 mg Oral Q breakfast   [START ON 09/22/2022] predniSONE  10 mg Oral Q breakfast   senna-docusate  1 tablet Oral BID   Continuous Infusions:  sodium chloride 20 mL/hr at 09/08/22 1851   PRN Meds: acetaminophen,  fluticasone, HYDROmorphone (DILAUDID) injection, ipratropium-albuterol, oxyCODONE, sodium chloride  Time spent: 50 minutes  Author: Gillis Santa. MD [facility] 09/10/2022 2:50 PM  To reach On-call, see care teams to locate the attending and reach out to them via [email or web address]. If 7PM-7AM, please contact night-coverage If you still have difficulty reaching the attending provider, please page the [facility] (Director on Call) for [facility] on amion for assistance.

## 2022-09-10 NOTE — Progress Notes (Signed)
2 Days Post-Op  Subjective: Frank [name] is POD #2  from a TURP for retention and a history of metastatic prostate cancer.  He had some SOB last night and I was contacted by the nurses for some difficulty keeping his sats up.  Hospitalist consult was placed and he was seen by Dr. Mikeal Hawthorne.  Breathing treatments were resumed and he was given solumedrol. He is doing better this morning with an O2 sat of 97 on HFNC.  He is tolerating the foley and the urine is clear but his Hgb has trended down to 9.4.   ROS:  Review of Systems  Respiratory:  Positive for shortness of breath and wheezing.   All other systems reviewed and are negative.   Anti-infectives: Anti-infectives (From admission, onward)    Start     Dose/Rate Route Frequency Ordered Stop   09/08/22 1303  gentamicin (GARAMYCIN) 320 mg in dextrose 5 % 100 mL IVPB        5 mg/kg  63.6 kg 108 mL/hr over 60 Minutes Intravenous 30 min pre-op 09/08/22 1303 09/08/22 1601       Current Facility-Administered Medications  Medication Dose Route Frequency Provider Last Rate Last Admin   0.9 %  sodium chloride infusion   Intravenous Continuous Berneice Heinrich Delbert Phenix., MD 20 mL/hr at 09/08/22 1851 New Bag at 09/08/22 1851   acetaminophen (TYLENOL) tablet 650 mg  650 mg Oral Q6H PRN [name], MD   650 mg at 09/09/22 2158   Chlorhexidine Gluconate Cloth 2 % PADS 6 each  6 each Topical Q0600 Loletta Parish., MD   6 each at 09/10/22 0552   fluticasone (FLONASE) 50 MCG/ACT nasal spray 1 spray  1 spray Each Nare Daily PRN Loletta Parish., MD   1 spray at 09/09/22 1702   fluticasone furoate-vilanterol (BREO ELLIPTA) 100-25 MCG/ACT 1 puff  1 puff Inhalation Daily Loletta Parish., MD   1 puff at 09/10/22 0631   And   umeclidinium bromide (INCRUSE ELLIPTA) 62.5 MCG/ACT 1 puff  1 puff Inhalation Daily Loletta Parish., MD   1 puff at 09/10/22 1610   guaiFENesin (MUCINEX) 12 hr tablet 600 mg  600 mg Oral BID Loletta Parish., MD   600 mg  at 09/09/22 2139   HYDROmorphone (DILAUDID) injection 0.5-1 mg  0.5-1 mg Intravenous Q2H PRN Loletta Parish., MD       ipratropium-albuterol (DUONEB) 0.5-2.5 (3) MG/3ML nebulizer solution 3 mL  3 mL Nebulization Q6H Loletta Parish., MD   3 mL at 09/10/22 9604   ipratropium-albuterol (DUONEB) 0.5-2.5 (3) MG/3ML nebulizer solution 3 mL  3 mL Nebulization Q6H PRN Loletta Parish., MD       loratadine (CLARITIN) tablet 10 mg  10 mg Oral Daily Loletta Parish., MD   10 mg at 09/09/22 1026   methylPREDNISolone sodium succinate (SOLU-MEDROL) 125 mg/2 mL injection 125 mg  125 mg Intravenous Q12H Earlie Lou L, MD   125 mg at 09/10/22 0030   oxyCODONE (Oxy IR/ROXICODONE) immediate release tablet 5 mg  5 mg Oral Q4H PRN Loletta Parish., MD       predniSONE (DELTASONE) tablet 10 mg  10 mg Oral Q breakfast Loletta Parish., MD   10 mg at 09/09/22 1026   senna-docusate (Senokot-S) tablet 1 tablet  1 tablet Oral BID Loletta Parish., MD   1 tablet at 09/09/22 2139   sodium chloride (  OCEAN) 0.65 % nasal spray 1 spray  1 spray Each Nare PRN [name], MD   1 spray at 09/09/22 2241     Objective: Vital signs in last 24 hours: Temp:  [98.1 F (36.7 C)-100.5 F (38.1 C)] 98.9 F (37.2 C) (05/05 0533) Pulse Rate:  [74-85] 80 (05/05 0533) Resp:  [18-22] 22 (05/05 0533) BP: (103-125)/(61-78) 125/78 (05/05 0533) SpO2:  [85 %-100 %] 97 % (05/05 0632) FiO2 (%):  [36 %] 36 % (05/04 1028)  Intake/Output from previous day: 05/04 0701 - 05/05 0700 In: 1080 [P.O.:1080] Out: 1450 [Urine:1450] Intake/Output this shift: No intake/output data recorded.   Physical Exam Vitals reviewed.  Constitutional:      Appearance: Normal appearance.  Cardiovascular:     Rate and Rhythm: Normal rate and regular rhythm.  Pulmonary:     Effort: No respiratory distress.     Breath sounds: Wheezing present.  Genitourinary:    Comments: Urine clear in foley tubing.  Neurological:      Mental Status: He is alert.     Lab Results:  Recent Labs    09/08/22 1707 09/09/22 1556  HGB 9.9* 9.4*  HCT 33.0* 30.4*    BMET No results for input(s): "NA", "K", "CL", "CO2", "GLUCOSE", "BUN", "CREATININE", "CALCIUM" in the last 72 hours. PT/INR No results for input(s): "LABPROT", "INR" in the last 72 hours. ABG No results for input(s): "PHART", "HCO3" in the last 72 hours.  Invalid input(s): "PCO2", "PO2"  Studies/Results: DG Chest Port 1 View  Result Date: 09/09/2022 CLINICAL DATA:  Oxygen desaturation.  Shortness of breath, cough EXAM: PORTABLE CHEST 1 VIEW COMPARISON:  08/05/2022 FINDINGS: Heart and mediastinal contours within normal limits. Aortic atherosclerosis. Stable mild hyperinflation. No confluent opacities or effusions. No acute bony abnormality. IMPRESSION: No active disease. Electronically Signed   By: Charlett Nose M.D.   On: 09/09/2022 23:09     Assessment and Plan: Prostate cancer with retention.  He is doing well s/p TURP and his urine is clear on minimal CBI.  His Hgb is 9.4 but there is no active bleeding.    COPD.  His oxygenation has improved with the steroid and breathing treatments.  He will need to be weaned to a lower O2 requirement prior to discharge.  Awaiting further recommendations from the hospitalist service.       LOS: 0 days    [name] 5/5/2024Patient ID: Frank [name], male   DOB: 1955/08/19, 67 y.o.   MRN: [id number] [name], male   DOB: April 08, 1956, 67 y.o.   MRN: [id number]

## 2022-09-11 ENCOUNTER — Encounter (HOSPITAL_COMMUNITY): Payer: Self-pay | Admitting: Urology

## 2022-09-11 ENCOUNTER — Inpatient Hospital Stay (HOSPITAL_COMMUNITY): Payer: Medicare Other

## 2022-09-11 DIAGNOSIS — R339 Retention of urine, unspecified: Secondary | ICD-10-CM | POA: Diagnosis not present

## 2022-09-11 LAB — CBC
HCT: 34 % — ABNORMAL LOW (ref 39.0–52.0)
Hemoglobin: 10.7 g/dL — ABNORMAL LOW (ref 13.0–17.0)
MCH: 28.8 pg (ref 26.0–34.0)
MCHC: 31.5 g/dL (ref 30.0–36.0)
MCV: 91.4 fL (ref 80.0–100.0)
Platelets: 313 10*3/uL (ref 150–400)
RBC: 3.72 MIL/uL — ABNORMAL LOW (ref 4.22–5.81)
RDW: 12.5 % (ref 11.5–15.5)
WBC: 12.2 10*3/uL — ABNORMAL HIGH (ref 4.0–10.5)
nRBC: 0 % (ref 0.0–0.2)

## 2022-09-11 LAB — BASIC METABOLIC PANEL
Anion gap: 6 (ref 5–15)
BUN: 24 mg/dL — ABNORMAL HIGH (ref 8–23)
CO2: 37 mmol/L — ABNORMAL HIGH (ref 22–32)
Calcium: 9 mg/dL (ref 8.9–10.3)
Chloride: 92 mmol/L — ABNORMAL LOW (ref 98–111)
Creatinine, Ser: 0.61 mg/dL (ref 0.61–1.24)
GFR, Estimated: >60 mL/min (ref >60)
Glucose, Bld: 122 mg/dL — ABNORMAL HIGH (ref 70–99)
Potassium: 3.7 mmol/L (ref 3.5–5.1)
Sodium: 135 mmol/L (ref 135–145)

## 2022-09-11 LAB — PHOSPHORUS: Phosphorus: 2.6 mg/dL (ref 2.5–4.6)

## 2022-09-11 LAB — MAGNESIUM: Magnesium: 2 mg/dL (ref 1.7–2.4)

## 2022-09-11 MED ORDER — ENOXAPARIN SODIUM 30 MG/0.3ML IJ SOSY
30.0000 mg | PREFILLED_SYRINGE | Freq: Every evening | INTRAMUSCULAR | Status: DC
  Administered 2022-09-11 – 2022-09-12 (×2): 30 mg via SUBCUTANEOUS
  Filled 2022-09-11 (×2): qty 0.3

## 2022-09-11 MED ORDER — ALUM & MAG HYDROXIDE-SIMETH 200-200-20 MG/5ML PO SUSP
30.0000 mL | Freq: Four times a day (QID) | ORAL | Status: DC | PRN
  Administered 2022-09-11: 30 mL via ORAL
  Filled 2022-09-11: qty 30

## 2022-09-11 MED ORDER — IOHEXOL 350 MG/ML SOLN
100.0000 mL | Freq: Once | INTRAVENOUS | Status: AC | PRN
  Administered 2022-09-11: 75 mL via INTRAVENOUS

## 2022-09-11 MED ORDER — SODIUM CHLORIDE (PF) 0.9 % IJ SOLN
INTRAMUSCULAR | Status: AC
  Filled 2022-09-11: qty 50

## 2022-09-11 MED ORDER — CALCIUM CARBONATE ANTACID 500 MG PO CHEW
1.0000 | CHEWABLE_TABLET | Freq: Once | ORAL | Status: AC
  Administered 2022-09-11: 200 mg via ORAL
  Filled 2022-09-11: qty 1

## 2022-09-11 NOTE — Progress Notes (Signed)
Patient despite multiple attempts has been unable to void post foley removal. Per Dr. Berneice Heinrich 18 french Coude Catheter inserted with immediate return of 950 yellow urine. Patient tolerated procedure well.

## 2022-09-11 NOTE — Progress Notes (Signed)
Dr. Berneice Heinrich given update regarding voiding. Continue at this time to monitor Patient and continue to assist with Patient to void. Patient updated

## 2022-09-11 NOTE — Progress Notes (Signed)
Patient post op TURP. Patient's foley removed early AM and Patient has not voided post foley removal and bladder scan with results of 801. MD paged

## 2022-09-11 NOTE — Progress Notes (Signed)
PROGRESS NOTE    Frank [name]  GQQ:[id number] DOB: 1955/07/12 DOA: 09/08/2022 PCP: Crissie Sickles, MD    Brief Narrative:  67 year old with history of COPD and chronic hypoxemic respite failure on 4 L oxygen at home, he was admitted to urology service for TURP.  After procedure he had significant shortness of breath and increasing oxygen requirement hence admitted to the hospital and medicine consulted.  He does have baseline shortness of breath with exertion and chronic wheezing.  He is optimized on COPD medications at home.   Assessment & Plan:   Acute on chronic hypoxemic respiratory failure due to COPD exacerbation. Aggressive bronchodilator therapy, IV steroids taper to oral steroids, inhalational steroids, scheduled and as needed bronchodilators, deep breathing exercises, incentive spirometry, chest physiotherapy.  Mobilize in the hallway. Antibiotics currently not indicated. Supplemental oxygen to keep saturations more than 90%. Due to significant symptoms, CT angiogram ordered.  Will follow-up.  Prostate cancer status post TURP: Voiding trial today.  Surgically stable as per surgery.  Patient with some clinical improvement today.  Will continue respiratory treatment today.  Likely transition home tomorrow if adequate improvement.  Thank you for involving [place] in this patient's care.  Will continue to follow-up until he is stabilized and able to go home.   DVT prophylaxis: enoxaparin (LOVENOX) injection 30 mg Start: 09/11/22 1800 SCDs Start: 09/08/22 1811   Code Status: Full code Family Communication: None at the bedside Disposition Plan: Status is: Inpatient Remains inpatient appropriate because: Significant shortness of breath     Consultants:  TRH  Procedures:  TURP 5/3, catheter removed 5/6  Antimicrobials:  None   Subjective: Patient seen in the morning rounds.  Feels much better than yesterday.  Still significant wheezing on mobility. Remains  afebrile.  Objective: Vitals:   09/10/22 2119 09/11/22 0239 09/11/22 0424 09/11/22 0834  BP:   (!) 144/84   Pulse:   75   Resp:      Temp:   98.4 F (36.9 C)   TempSrc:   Oral   SpO2: 92% 93% 93% 92%  Weight:      Height:        Intake/Output Summary (Last 24 hours) at 09/11/2022 1146 Last data filed at 09/11/2022 0600 Gross per 24 hour  Intake 480 ml  Output 2650 ml  Net -2170 ml   Filed Weights   09/08/22 1305  Weight: 40.2 kg    Examination:  General: Chronically sick looking but not in any distress.  Able to talk in complete sentences. Cardiovascular: S1-S2 normal.  Regular rate rhythm. Respiratory: Bilateral clear.  Occasional scattered expiratory wheezes. Gastrointestinal: Soft.  Nontender.  Bowel sound present. Ext: No edema or swelling. Neuro: Intact.      Data Reviewed: I have personally reviewed following labs and imaging studies  CBC: Recent Labs  Lab 09/08/22 1707 09/09/22 1556 09/11/22 0541  WBC  --   --  12.2*  HGB 9.9* 9.4* 10.7*  HCT 33.0* 30.4* 34.0*  MCV  --   --  91.4  PLT  --   --  313   Basic Metabolic Panel: Recent Labs  Lab 09/10/22 1512 09/11/22 0541  NA 137 135  K 3.9 3.7  CL 94* 92*  CO2 34* 37*  GLUCOSE 237* 122*  BUN 25* 24*  CREATININE 0.70 0.61  CALCIUM 8.7* 9.0  MG 2.0 2.0  PHOS 2.6 2.6   GFR: Estimated Creatinine Clearance: 50.3 mL/min (by C-G formula based on SCr of 0.61 mg/dL). Liver Function  Tests: No results for input(s): "AST", "ALT", "ALKPHOS", "BILITOT", "PROT", "ALBUMIN" in the last 168 hours. No results for input(s): "LIPASE", "AMYLASE" in the last 168 hours. No results for input(s): "AMMONIA" in the last 168 hours. Coagulation Profile: No results for input(s): "INR", "PROTIME" in the last 168 hours. Cardiac Enzymes: No results for input(s): "CKTOTAL", "CKMB", "CKMBINDEX", "TROPONINI" in the last 168 hours. BNP (last 3 results) No results for input(s): "PROBNP" in the last 8760 hours. HbA1C: No  results for input(s): "HGBA1C" in the last 72 hours. CBG: No results for input(s): "GLUCAP" in the last 168 hours. Lipid Profile: No results for input(s): "CHOL", "HDL", "LDLCALC", "TRIG", "CHOLHDL", "LDLDIRECT" in the last 72 hours. Thyroid Function Tests: No results for input(s): "TSH", "T4TOTAL", "FREET4", "T3FREE", "THYROIDAB" in the last 72 hours. Anemia Panel: No results for input(s): "VITAMINB12", "FOLATE", "FERRITIN", "TIBC", "IRON", "RETICCTPCT" in the last 72 hours. Sepsis Labs: No results for input(s): "PROCALCITON", "LATICACIDVEN" in the last 168 hours.  No results found for this or any previous visit (from the past 240 hour(s)).       Radiology Studies: CT Angio Chest Pulmonary Embolism (PE) W or WO Contrast  Result Date: 09/11/2022 CLINICAL DATA:  67 year old male with shortness of breath. EXAM: CT ANGIOGRAPHY CHEST WITH CONTRAST TECHNIQUE: Multidetector CT imaging of the chest was performed using the standard protocol during bolus administration of intravenous contrast. Multiplanar CT image reconstructions and MIPs were obtained to evaluate the vascular anatomy. RADIATION DOSE REDUCTION: This exam was performed according to the departmental dose-optimization program which includes automated exposure control, adjustment of the mA and/or kV according to patient size and/or use of iterative reconstruction technique. CONTRAST:  75mL OMNIPAQUE IOHEXOL 350 MG/ML SOLN COMPARISON:  Portable chest x-ray 09/09/2022. CT Abdomen and Pelvis 08/04/2022. FINDINGS: Cardiovascular: Good contrast bolus timing in the pulmonary arterial tree. No focal filling defect identified in the pulmonary arteries to suggest acute pulmonary embolism. Calcified coronary artery plaque and/or stents. Calcified aortic atherosclerosis. Cardiac size remains within normal limits. No pericardial effusion. Mediastinum/Nodes: Fluid-filled esophagus throughout the chest, mildly dilated. No superimposed mediastinal mass or  lymphadenopathy. Lungs/Pleura: Moderate to severe emphysema in all lobes. Trace retained secretions in the airways with some major airway atelectatic changes. Retained secretions in the left mainstem bronchus. Partially opacified lower lobe airways greater on the right side (series 6, image 107). New confluent but enhancing bilateral costophrenic angle opacity more resembles atelectasis than pneumonia. Trace new pleural effusions since March. Upper Abdomen: Fluid-filled gastroesophageal junction with dilated phrenic ampulla or small gastric hiatal hernia. Air-fluid level in the stomach. However, the partially visible duodenal bulb might not be dilated. No superimposed free air or free fluid in the upper abdomen. Stable and negative visible other upper abdominal viscera. Musculoskeletal: Numerous small sclerotic foci scattered in the spine, and a larger 2 cm heterogeneous sclerotic lesion in the posterior right T7 body. The patient has a history of metastatic prostate cancer. Similar scattered sclerotic rib metastases. Rib motion artifact. No acute rib or other pathologic fracture is identified. Review of the MIP images confirms the above findings. IMPRESSION: 1. No evidence of acute pulmonary embolus. 2. Fluid-filled and mildly dilated Esophagus and visible Stomach. Query bowel obstruction in the abdomen. No upper abdominal free air or free fluid identified. 3. Severe Emphysema (ICD10-J43.9). Trace bilateral pleural effusions. Retained or aspirated secretions in some of the lower lobe airways. But dependent lower lobe opacity more resembles atelectasis than pneumonia. 4. Widely scattered sclerotic bone metastases. 5.  Aortic Atherosclerosis (ICD10-I70.0). Electronically  Signed   By: Odessa Fleming M.D.   On: 09/11/2022 10:52   DG Chest Port 1 View  Result Date: 09/09/2022 CLINICAL DATA:  Oxygen desaturation.  Shortness of breath, cough EXAM: PORTABLE CHEST 1 VIEW COMPARISON:  08/05/2022 FINDINGS: Heart and mediastinal  contours within normal limits. Aortic atherosclerosis. Stable mild hyperinflation. No confluent opacities or effusions. No acute bony abnormality. IMPRESSION: No active disease. Electronically Signed   By: Charlett Nose M.D.   On: 09/09/2022 23:09        Scheduled Meds:  Chlorhexidine Gluconate Cloth  6 each Topical Q0600   enoxaparin (LOVENOX) injection  30 mg Subcutaneous QPM   feeding supplement  237 mL Oral TID BM   fluticasone furoate-vilanterol  1 puff Inhalation Daily   And   umeclidinium bromide  1 puff Inhalation Daily   guaiFENesin  600 mg Oral BID   ipratropium-albuterol  3 mL Nebulization Q6H   loratadine  10 mg Oral Daily   methylPREDNISolone (SOLU-MEDROL) injection  40 mg Intravenous Q12H   Followed by   Melene Muller ON 09/12/2022] methylPREDNISolone (SOLU-MEDROL) injection  40 mg Intravenous Daily   Followed by   Melene Muller ON 09/13/2022] predniSONE  40 mg Oral Q breakfast   Followed by   Melene Muller ON 09/15/2022] predniSONE  30 mg Oral Q breakfast   Followed by   Melene Muller ON 09/18/2022] predniSONE  20 mg Oral Q breakfast   Followed by   Melene Muller ON 09/21/2022] predniSONE  10 mg Oral Q breakfast   [START ON 09/22/2022] predniSONE  10 mg Oral Q breakfast   senna-docusate  1 tablet Oral BID   Continuous Infusions:  sodium chloride 20 mL/hr at 09/08/22 1851     LOS: 1 day    Time spent: 35 minutes    Dorcas Carrow, MD [facility] Pager [phone]

## 2022-09-11 NOTE — Progress Notes (Signed)
3 Days Post-Op   Subjective/Chief Complaint:  1 - Urinary Retention / Metastatic Prostate Cancer - s/p TURP under spinal 09/08/22 for med-refracotry retention. Path pending. Catheter removed 5/6.   2 - Acute on Chronic Respiratory Failure - on 4LO2 at baseline. Incresed O2 req post-op despite essentilaly nil fluid use and no intubation / airway manipulation. Hospitalsit consult 5/4 and treating as COPD exacerbation with steroid taper.  Today "Frank [name]" is feeling improved from . He states less SOB. NO fevers. Foley removed this AM according to plan.    Objective: Vital signs in last 24 hours: Temp:  [98 F (36.7 C)-98.4 F (36.9 C)] 98.4 F (36.9 C) (05/06 0424) Pulse Rate:  [75-93] 75 (05/06 0424) Resp:  [16] 16 (05/05 1945) BP: (130-144)/(68-84) 144/84 (05/06 0424) SpO2:  [84 %-93 %] 93 % (05/06 0424) FiO2 (%):  [36 %] 36 % (05/05 0843) Last BM Date : 09/11/22  Intake/Output from previous day: 05/05 0701 - 05/06 0700 In: 840 [P.O.:840] Out: 2650 [Urine:2650] Intake/Output this shift: No intake/output data recorded.  NAD on 6LO2 RRR Stable / baselien level of increased WOB. No rales SNTND Foley out Minimal LE edema  Lab Results:  Recent Labs    09/09/22 1556 09/11/22 0541  WBC  --  12.2*  HGB 9.4* 10.7*  HCT 30.4* 34.0*  PLT  --  313   BMET Recent Labs    09/10/22 1512 09/11/22 0541  NA 137 135  K 3.9 3.7  CL 94* 92*  CO2 34* 37*  GLUCOSE 237* 122*  BUN 25* 24*  CREATININE 0.70 0.61  CALCIUM 8.7* 9.0   PT/INR No results for input(s): "LABPROT", "INR" in the last 72 hours. ABG No results for input(s): "PHART", "HCO3" in the last 72 hours.  Invalid input(s): "PCO2", "PO2"  Studies/Results: DG Chest Port 1 View  Result Date: 09/09/2022 CLINICAL DATA:  Oxygen desaturation.  Shortness of breath, cough EXAM: PORTABLE CHEST 1 VIEW COMPARISON:  08/05/2022 FINDINGS: Heart and mediastinal contours within normal limits. Aortic atherosclerosis. Stable mild  hyperinflation. No confluent opacities or effusions. No acute bony abnormality. IMPRESSION: No active disease. Electronically Signed   By: Charlett Nose M.D.   On: 09/09/2022 23:09    Anti-infectives: Anti-infectives (From admission, onward)    Start     Dose/Rate Route Frequency Ordered Stop   09/08/22 1303  gentamicin (GARAMYCIN) 320 mg in dextrose 5 % 100 mL IVPB        5 mg/kg  63.6 kg 108 mL/hr over 60 Minutes Intravenous 30 min pre-op 09/08/22 1303 09/08/22 1601       Assessment/Plan:  Trial of void (catheter out) s/p TURP.   Appreciate hospitalist team help with COPD exacerbation. This is really central issue keeping him in house at this point and appears to be trending better.     Loletta Parish. 09/11/2022

## 2022-09-12 DIAGNOSIS — C7951 Secondary malignant neoplasm of bone: Secondary | ICD-10-CM

## 2022-09-12 DIAGNOSIS — C61 Malignant neoplasm of prostate: Secondary | ICD-10-CM

## 2022-09-12 DIAGNOSIS — Z9981 Dependence on supplemental oxygen: Secondary | ICD-10-CM

## 2022-09-12 DIAGNOSIS — R339 Retention of urine, unspecified: Secondary | ICD-10-CM | POA: Diagnosis not present

## 2022-09-12 DIAGNOSIS — J9611 Chronic respiratory failure with hypoxia: Secondary | ICD-10-CM | POA: Diagnosis not present

## 2022-09-12 LAB — SURGICAL PATHOLOGY

## 2022-09-12 MED ORDER — IPRATROPIUM-ALBUTEROL 0.5-2.5 (3) MG/3ML IN SOLN: 3.0000 mL | Freq: Two times a day (BID) | RESPIRATORY_TRACT | Status: DC

## 2022-09-12 MED ORDER — IPRATROPIUM-ALBUTEROL 0.5-2.5 (3) MG/3ML IN SOLN
3.0000 mL | Freq: Four times a day (QID) | RESPIRATORY_TRACT | Status: DC
  Administered 2022-09-12 – 2022-09-13 (×5): 3 mL via RESPIRATORY_TRACT
  Filled 2022-09-12 (×6): qty 3

## 2022-09-12 NOTE — Progress Notes (Signed)
PROGRESS NOTE    Frank [name]  ZOX:[id number] DOB: Sep 19, 1955 DOA: 09/08/2022 PCP: Crissie Sickles, MD    Brief Narrative:  67 year old with history of COPD and chronic hypoxemic respite failure on 4 L oxygen at home, he was admitted to urology service for TURP.  After procedure he had significant shortness of breath and increasing oxygen requirement hence admitted to the hospital and medicine consulted.  He does have baseline shortness of breath with exertion and chronic wheezing.  He is optimized on COPD medications at home.   Assessment & Plan:   Acute on chronic hypoxemic respiratory failure due to COPD exacerbation. Aggressive bronchodilator therapy, IV steroids to oral steroids taper, inhalational steroids, scheduled and as needed bronchodilators, deep breathing exercises, incentive spirometry, chest physiotherapy.  Mobilize in the hallway. Antibiotics currently not indicated. Supplemental oxygen to keep saturations more than 90%. Due to significant symptoms, CT angiogram ordered that is negative for pulmonary embolism and shows significant COPD changes. Patient does have advanced COPD, he may need more oxygen at home.  Will need to requalify him for higher capacity concentrator.  Prostate cancer status post TURP: Failed voiding trial.  Foley reinserted.  Possible home on Foley catheter.  Patient with some clinical improvement today.  Will continue respiratory treatment today.  Likely transition home tomorrow if adequate improvement. Will explore need for higher capacity concentrator.  Thank you for involving [place] in this patient's care.  Will continue to follow-up until he is stabilized and able to go home.   DVT prophylaxis: enoxaparin (LOVENOX) injection 30 mg Start: 09/11/22 1800 SCDs Start: 09/08/22 1811   Code Status: Full code Family Communication: None at the bedside Disposition Plan: Status is: Inpatient Remains inpatient appropriate because: Significant shortness of  breath     Consultants:  TRH  Procedures:  TURP 5/3,   Antimicrobials:  None   Subjective:  Patient seen and examined in the morning rounds.  He had to have a catheter replaced.  Breathing is still a struggle especially with mobility.  He lives at home by himself.  His concentrator only delivers 5 L of oxygen and he uses 10 feet long cord.   Objective: Vitals:   09/12/22 0450 09/12/22 0600 09/12/22 0616 09/12/22 0810  BP: 126/74     Pulse: 83     Resp:      Temp: 98.2 F (36.8 C)     TempSrc: Oral     SpO2: 94% (!) 80% 94% 94%  Weight:      Height:        Intake/Output Summary (Last 24 hours) at 09/12/2022 1023 Last data filed at 09/12/2022 0647 Gross per 24 hour  Intake 717 ml  Output 2100 ml  Net -1383 ml    Filed Weights   09/08/22 1305  Weight: 40.2 kg    Examination:  General: Chronically sick looking but not in any distress.  Mild distress on conversation. Cardiovascular: S1-S2 normal.  Regular rate rhythm. Respiratory: Bilateral clear.  Occasional scattered expiratory wheezes.  Currently on 6 L oxygen. SpO2: 94 % O2 Flow Rate (L/min): 6 L/min FiO2 (%): 36 %  Gastrointestinal: Soft.  Nontender.  Bowel sound present. Ext: No edema or swelling. Neuro: Intact.      Data Reviewed: I have personally reviewed following labs and imaging studies  CBC: Recent Labs  Lab 09/08/22 1707 09/09/22 1556 09/11/22 0541  WBC  --   --  12.2*  HGB 9.9* 9.4* 10.7*  HCT 33.0* 30.4* 34.0*  MCV  --   --  91.4  PLT  --   --  313    Basic Metabolic Panel: Recent Labs  Lab 09/10/22 1512 09/11/22 0541  NA 137 135  K 3.9 3.7  CL 94* 92*  CO2 34* 37*  GLUCOSE 237* 122*  BUN 25* 24*  CREATININE 0.70 0.61  CALCIUM 8.7* 9.0  MG 2.0 2.0  PHOS 2.6 2.6    GFR: Estimated Creatinine Clearance: 50.3 mL/min (by C-G formula based on SCr of 0.61 mg/dL). Liver Function Tests: No results for input(s): "AST", "ALT", "ALKPHOS", "BILITOT", "PROT", "ALBUMIN" in the  last 168 hours. No results for input(s): "LIPASE", "AMYLASE" in the last 168 hours. No results for input(s): "AMMONIA" in the last 168 hours. Coagulation Profile: No results for input(s): "INR", "PROTIME" in the last 168 hours. Cardiac Enzymes: No results for input(s): "CKTOTAL", "CKMB", "CKMBINDEX", "TROPONINI" in the last 168 hours. BNP (last 3 results) No results for input(s): "PROBNP" in the last 8760 hours. HbA1C: No results for input(s): "HGBA1C" in the last 72 hours. CBG: No results for input(s): "GLUCAP" in the last 168 hours. Lipid Profile: No results for input(s): "CHOL", "HDL", "LDLCALC", "TRIG", "CHOLHDL", "LDLDIRECT" in the last 72 hours. Thyroid Function Tests: No results for input(s): "TSH", "T4TOTAL", "FREET4", "T3FREE", "THYROIDAB" in the last 72 hours. Anemia Panel: No results for input(s): "VITAMINB12", "FOLATE", "FERRITIN", "TIBC", "IRON", "RETICCTPCT" in the last 72 hours. Sepsis Labs: No results for input(s): "PROCALCITON", "LATICACIDVEN" in the last 168 hours.  No results found for this or any previous visit (from the past 240 hour(s)).       Radiology Studies: CT Angio Chest Pulmonary Embolism (PE) W or WO Contrast  Result Date: 09/11/2022 CLINICAL DATA:  67 year old male with shortness of breath. EXAM: CT ANGIOGRAPHY CHEST WITH CONTRAST TECHNIQUE: Multidetector CT imaging of the chest was performed using the standard protocol during bolus administration of intravenous contrast. Multiplanar CT image reconstructions and MIPs were obtained to evaluate the vascular anatomy. RADIATION DOSE REDUCTION: This exam was performed according to the departmental dose-optimization program which includes automated exposure control, adjustment of the mA and/or kV according to patient size and/or use of iterative reconstruction technique. CONTRAST:  75mL OMNIPAQUE IOHEXOL 350 MG/ML SOLN COMPARISON:  Portable chest x-ray 09/09/2022. CT Abdomen and Pelvis 08/04/2022. FINDINGS:  Cardiovascular: Good contrast bolus timing in the pulmonary arterial tree. No focal filling defect identified in the pulmonary arteries to suggest acute pulmonary embolism. Calcified coronary artery plaque and/or stents. Calcified aortic atherosclerosis. Cardiac size remains within normal limits. No pericardial effusion. Mediastinum/Nodes: Fluid-filled esophagus throughout the chest, mildly dilated. No superimposed mediastinal mass or lymphadenopathy. Lungs/Pleura: Moderate to severe emphysema in all lobes. Trace retained secretions in the airways with some major airway atelectatic changes. Retained secretions in the left mainstem bronchus. Partially opacified lower lobe airways greater on the right side (series 6, image 107). New confluent but enhancing bilateral costophrenic angle opacity more resembles atelectasis than pneumonia. Trace new pleural effusions since March. Upper Abdomen: Fluid-filled gastroesophageal junction with dilated phrenic ampulla or small gastric hiatal hernia. Air-fluid level in the stomach. However, the partially visible duodenal bulb might not be dilated. No superimposed free air or free fluid in the upper abdomen. Stable and negative visible other upper abdominal viscera. Musculoskeletal: Numerous small sclerotic foci scattered in the spine, and a larger 2 cm heterogeneous sclerotic lesion in the posterior right T7 body. The patient has a history of metastatic prostate cancer. Similar scattered sclerotic rib metastases. Rib motion artifact. No acute rib or other pathologic fracture  is identified. Review of the MIP images confirms the above findings. IMPRESSION: 1. No evidence of acute pulmonary embolus. 2. Fluid-filled and mildly dilated Esophagus and visible Stomach. Query bowel obstruction in the abdomen. No upper abdominal free air or free fluid identified. 3. Severe Emphysema (ICD10-J43.9). Trace bilateral pleural effusions. Retained or aspirated secretions in some of the lower lobe  airways. But dependent lower lobe opacity more resembles atelectasis than pneumonia. 4. Widely scattered sclerotic bone metastases. 5.  Aortic Atherosclerosis (ICD10-I70.0). Electronically Signed   By: Odessa Fleming M.D.   On: 09/11/2022 10:52        Scheduled Meds:  Chlorhexidine Gluconate Cloth  6 each Topical Q0600   enoxaparin (LOVENOX) injection  30 mg Subcutaneous QPM   feeding supplement  237 mL Oral TID BM   fluticasone furoate-vilanterol  1 puff Inhalation Daily   And   umeclidinium bromide  1 puff Inhalation Daily   guaiFENesin  600 mg Oral BID   ipratropium-albuterol  3 mL Nebulization Q6H   loratadine  10 mg Oral Daily   methylPREDNISolone (SOLU-MEDROL) injection  40 mg Intravenous Daily   Followed by   Melene Muller ON 09/13/2022] predniSONE  40 mg Oral Q breakfast   Followed by   Melene Muller ON 09/15/2022] predniSONE  30 mg Oral Q breakfast   Followed by   Melene Muller ON 09/18/2022] predniSONE  20 mg Oral Q breakfast   Followed by   Melene Muller ON 09/21/2022] predniSONE  10 mg Oral Q breakfast   [START ON 09/22/2022] predniSONE  10 mg Oral Q breakfast   senna-docusate  1 tablet Oral BID   Continuous Infusions:  sodium chloride 20 mL/hr at 09/08/22 1851     LOS: 2 days    Time spent: 35 minutes    Dorcas Carrow, MD [facility] Pager [phone]

## 2022-09-12 NOTE — TOC Progression Note (Signed)
Transition of Care [facility]) - Progression Note    Patient Details  Name: Frank [name] MRN: [id number] Date of Birth: [birth year]  Transition of Care [facility]) CM/SW Contact  Natanya Holecek, Olegario Messier, RN Phone Number: 09/12/2022, 10:33 AM  Clinical Narrative:May need increase in home 02 d/t requirements. [employer] rep Barbara Cower already following-awaiting 02 sats, & home 02 order prior delivering appropriate home 02 travel tank to rm prior d/c. Continue to monitor.       Expected Discharge Plan: Home/Self Care Barriers to Discharge: No Barriers Identified  Expected Discharge Plan and Services   Discharge Planning Services: CM Consult     Expected Discharge Date: 09/09/22                                     Social Determinants of Health (SDOH) Interventions SDOH Screenings   Food Insecurity: No Food Insecurity (09/09/2022)  Housing: Low Risk  (09/09/2022)  Transportation Needs: No Transportation Needs (09/09/2022)  Recent Concern: Transportation Needs - Unmet Transportation Needs (08/08/2022)  Utilities: Not At Risk (09/09/2022)  Alcohol Screen: Low Risk  (07/17/2022)  Depression (PHQ2-9): Low Risk  (07/17/2022)  Financial Resource Strain: Low Risk  (07/17/2022)  Physical Activity: Inactive (07/17/2022)  Social Connections: Socially Isolated (07/17/2022)  Stress: No Stress Concern Present (07/17/2022)  Tobacco Use: Medium Risk (09/11/2022)    Readmission Risk Interventions    09/11/2022   12:32 PM 08/08/2022   10:30 AM  Readmission Risk Prevention Plan  Transportation Screening Complete Complete  PCP or Specialist Appt within 5-7 Days  Complete  PCP or Specialist Appt within 3-5 Days Complete   Home Care Screening  Complete  Medication Review (RN CM)  Complete  HRI or Home Care Consult Complete   Social Work Consult for Recovery Care Planning/Counseling Complete   Palliative Care Screening Not Applicable   Medication Review Oceanographer) Complete

## 2022-09-12 NOTE — Progress Notes (Signed)
   09/12/22 0600  Oxygen Therapy  SpO2 (!) 80 %  O2 Device Nasal Cannula  O2 Flow Rate (L/min) 6 L/min  Patient Activity (if Appropriate) Ambulating  Pulse Oximetry Type Continuous   Assisted patient to walk in the hallway. No complaints of dizziness and pain. SOB noted. Returned to room and made comfortable in the bed. Placed back to HFNC at 6 lpm. O2 sat checked thereafter. O2 sat- 94 %. Will continue to monitor.

## 2022-09-12 NOTE — Progress Notes (Addendum)
4 Days Post-Op Subjective: Frank [name]. Pt still struggling with pulmonary issues.   Objective: Vital signs in last 24 hours: Temp:  [97.4 F (36.3 C)-98.8 F (37.1 C)] 98.2 F (36.8 C) (05/07 0450) Pulse Rate:  [74-100] 83 (05/07 0450) Resp:  [20] 20 (05/06 1251) BP: (126-160)/(64-91) 126/74 (05/07 0450) SpO2:  [80 %-96 %] 94 % (05/07 0810)  Intake/Output from previous day: 05/06 0701 - 05/07 0700 In: 957 [P.O.:957] Out: 2100 [Urine:2100]  Intake/Output this shift: No intake/output data recorded.  Physical Exam:  General: Alert and oriented CV: No cyanosis Lungs: equal chest rise Abdomen: Soft, NTND, no rebound or guarding Skin: Gu: foley draining clear yellow urine  Lab Results: Recent Labs    09/09/22 1556 09/11/22 0541  HGB 9.4* 10.7*  HCT 30.4* 34.0*   BMET Recent Labs    09/10/22 1512 09/11/22 0541  NA 137 135  K 3.9 3.7  CL 94* 92*  CO2 34* 37*  GLUCOSE 237* 122*  BUN 25* 24*  CREATININE 0.70 0.61  CALCIUM 8.7* 9.0     Studies/Results: CT Angio Chest Pulmonary Embolism (PE) W or WO Contrast  Result Date: 09/11/2022 CLINICAL DATA:  67 year old male with shortness of breath. EXAM: CT ANGIOGRAPHY CHEST WITH CONTRAST TECHNIQUE: Multidetector CT imaging of the chest was performed using the standard protocol during bolus administration of intravenous contrast. Multiplanar CT image reconstructions and MIPs were obtained to evaluate the vascular anatomy. RADIATION DOSE REDUCTION: This exam was performed according to the departmental dose-optimization program which includes automated exposure control, adjustment of the mA and/or kV according to patient size and/or use of iterative reconstruction technique. CONTRAST:  75mL OMNIPAQUE IOHEXOL 350 MG/ML SOLN COMPARISON:  Portable chest x-ray 09/09/2022. CT Abdomen and Pelvis 08/04/2022. FINDINGS: Cardiovascular: Good contrast bolus timing in the pulmonary arterial tree. No focal filling defect identified in the pulmonary  arteries to suggest acute pulmonary embolism. Calcified coronary artery plaque and/or stents. Calcified aortic atherosclerosis. Cardiac size remains within normal limits. No pericardial effusion. Mediastinum/Nodes: Fluid-filled esophagus throughout the chest, mildly dilated. No superimposed mediastinal mass or lymphadenopathy. Lungs/Pleura: Moderate to severe emphysema in all lobes. Trace retained secretions in the airways with some major airway atelectatic changes. Retained secretions in the left mainstem bronchus. Partially opacified lower lobe airways greater on the right side (series 6, image 107). New confluent but enhancing bilateral costophrenic angle opacity more resembles atelectasis than pneumonia. Trace new pleural effusions since March. Upper Abdomen: Fluid-filled gastroesophageal junction with dilated phrenic ampulla or small gastric hiatal hernia. Air-fluid level in the stomach. However, the partially visible duodenal bulb might not be dilated. No superimposed free air or free fluid in the upper abdomen. Stable and negative visible other upper abdominal viscera. Musculoskeletal: Numerous small sclerotic foci scattered in the spine, and a larger 2 cm heterogeneous sclerotic lesion in the posterior right T7 body. The patient has a history of metastatic prostate cancer. Similar scattered sclerotic rib metastases. Rib motion artifact. No acute rib or other pathologic fracture is identified. Review of the MIP images confirms the above findings. IMPRESSION: 1. No evidence of acute pulmonary embolus. 2. Fluid-filled and mildly dilated Esophagus and visible Stomach. Query bowel obstruction in the abdomen. No upper abdominal free air or free fluid identified. 3. Severe Emphysema (ICD10-J43.9). Trace bilateral pleural effusions. Retained or aspirated secretions in some of the lower lobe airways. But dependent lower lobe opacity more resembles atelectasis than pneumonia. 4. Widely scattered sclerotic bone  metastases. 5.  Aortic Atherosclerosis (ICD10-I70.0). Electronically Signed   By:  Odessa Fleming M.D.   On: 09/11/2022 10:52    Assessment/Plan: Urinary Retention / Metastatic Prostate Cancer -  s/p TURP under spinal 09/08/22 for med-refracotry retention. Path pending. Catheter removed 5/6.  Failed voiding trial.  Replaced Foley.  2 - Acute on Chronic Respiratory Failure -  on 4LO2 at baseline.  On 6L [place] this morning.  Pursed lip breathing.  CT angio showed no evidence of pulmonary embolism.  Diffuse emphysema with signs of atelectasis at bases.  Incentive spirometer was located across the room.  Reviewed appropriate usage.  Present max volume 500 mL.  Patient will use multiple times per hour and recommend up in chair if not sleeping.  1 more day of IV Solu-Medrol and then transitioning into oral prednisone taper per hospitalist. Appreciate assistance.   The only PFT available was collected 10 years ago and at that time showed moderate obstructive ventilatory defect with evidence of air trapping and reduced diffusion capacity. Considering his level of emphysema and signs of severe obstruction, he may not have the peak inspiratory force required for good lung deposition of dry powdered inhalers.  He may benefit from transition to nebulized treatment.    LOS: 2 days   Elmon Kirschner, NP [facility] Pager: [phone]  09/12/2022, 8:53 AM    Pt seen and examined. Agree with Cameron's plan. Greatly appreciate hosp team comanagement of substantial pulm comorbidity.   Hopefully DC later this week with higher capacity O2 generator for home.

## 2022-09-13 ENCOUNTER — Telehealth: Payer: Self-pay | Admitting: Emergency Medicine

## 2022-09-13 DIAGNOSIS — C7951 Secondary malignant neoplasm of bone: Secondary | ICD-10-CM | POA: Diagnosis not present

## 2022-09-13 DIAGNOSIS — R339 Retention of urine, unspecified: Secondary | ICD-10-CM | POA: Diagnosis not present

## 2022-09-13 DIAGNOSIS — J9611 Chronic respiratory failure with hypoxia: Secondary | ICD-10-CM

## 2022-09-13 DIAGNOSIS — C61 Malignant neoplasm of prostate: Secondary | ICD-10-CM | POA: Diagnosis not present

## 2022-09-13 DIAGNOSIS — J449 Chronic obstructive pulmonary disease, unspecified: Secondary | ICD-10-CM

## 2022-09-13 MED ORDER — PREDNISONE 10 MG PO TABS: 30.0000 mg | ORAL_TABLET | Freq: Every day | ORAL | 0 refills | Status: DC

## 2022-09-13 MED ORDER — PREDNISONE 20 MG PO TABS: 40.0000 mg | ORAL_TABLET | Freq: Every day | ORAL | 0 refills | Status: DC

## 2022-09-13 MED ORDER — PREDNISONE 20 MG PO TABS: 20.0000 mg | ORAL_TABLET | Freq: Every day | ORAL | 0 refills | Status: DC

## 2022-09-13 NOTE — TOC Transition Note (Signed)
Transition of Care [facility]) - CM/SW Discharge Note   Patient Details  Name: Frank [name] MRN: [id number] Date of Birth: 1956/03/04  Transition of Care [facility]) CM/SW Contact:  Lavenia Atlas, RN Phone Number: 09/13/2022, 6:58 PM   Clinical Narrative:   This RNCM received a secure chat from [employer] regarding patient needing home oxygen concentrator to complete discharge home. This RNCM spoke with patient who is an existing client with [employer]. Per chart review patient's oxygen demand has increased to 6L requiring a new/larger concentrator.  This RNCM spoke with Barbara Cower with [employer] who reports a 10L concentrator will be delivered to patient's home tonight, patient does have a portable tank at bedside. This RNCM notified patient, RN of [employer]'s plan to deliver the 10 L concentrator.   No additional TOC needs at this time.     Final next level of care: Home/Self Care Barriers to Discharge: No Barriers Identified   Patient Goals and CMS Choice CMS Medicare.gov Compare Post Acute Care list provided to:: Patient Choice offered to / list presented to : Patient  Discharge Placement                         Discharge Plan and Services Additional resources added to the After Visit Summary for     Discharge Planning Services: CM Consult            DME Arranged: Oxygen DME Agency: [employer] Date DME Agency Contacted: 09/13/22 Time DME Agency Contacted: [phone] Representative spoke with at DME Agency: Barbara Cower            Social Determinants of Health (SDOH) Interventions SDOH Screenings   Food Insecurity: No Food Insecurity (09/09/2022)  Housing: Low Risk  (09/09/2022)  Transportation Needs: No Transportation Needs (09/09/2022)  Recent Concern: Transportation Needs - Unmet Transportation Needs (08/08/2022)  Utilities: Not At Risk (09/09/2022)  Alcohol Screen: Low Risk  (07/17/2022)  Depression (PHQ2-9): Low Risk  (07/17/2022)  Financial Resource Strain: Low Risk  (07/17/2022)  Physical  Activity: Inactive (07/17/2022)  Social Connections: Socially Isolated (07/17/2022)  Stress: No Stress Concern Present (07/17/2022)  Tobacco Use: Medium Risk (09/11/2022)     Readmission Risk Interventions    09/11/2022   12:32 PM 08/08/2022   10:30 AM  Readmission Risk Prevention Plan  Transportation Screening Complete Complete  PCP or Specialist Appt within 5-7 Days  Complete  PCP or Specialist Appt within 3-5 Days Complete   Home Care Screening  Complete  Medication Review (RN CM)  Complete  HRI or Home Care Consult Complete   Social Work Consult for Recovery Care Planning/Counseling Complete   Palliative Care Screening Not Applicable   Medication Review Oceanographer) Complete

## 2022-09-13 NOTE — Telephone Encounter (Signed)
Mr Perkey needs to be referred to palliative care. I am willing to be the attending of record.  The diagnoses are end-stage COPD and chronic respiratory failure with hypoxia.   Thank you

## 2022-09-13 NOTE — Care Management Important Message (Signed)
Important Message  Patient Details IM Letter given Name: Frank [name] MRN: [id number] Date of Birth: 07/28/1955   Medicare Important Message Given:  Yes     Caren Macadam 09/13/2022, 10:48 AM

## 2022-09-13 NOTE — Discharge Summary (Signed)
Frank [name] is a 67 year old male presenting to [facility] for scheduled transurethral resection of prostate scheduled with Dr. Berneice Heinrich.  PMH significant for metastatic prostate cancer, recurrent urolithiasis, lower urinary tract symptoms, urinary retention, and bone metastasis.  Pathology report for prostate chips was for prostatic adenocarcinoma.    His surgery was completed without complication and he was discharged to the floor for labs and monitoring.  His bleeding had resolved by day 1.    Unfortunately his very decompensated pulmonary status kept him in hospital for an additional 5 days.  The hospital service continued nebulizers, steroid taper, and aggressive pulmonary toilet, eventually getting patient to the point of ambulating without desaturation.  A more powerful oxygen concentrator was ordered from home with direction to follow-up closely with his pulmonologist.  On assessment this morning he was alert, oriented, and had much more energy than he had in days past.  He was eager to discharge home. He will follow-up closely for voiding trial and to discuss adenocarcinoma.  Frank Scarce, NP [facility] [phone]  Vitals:   09/13/22 0851 09/13/22 1258  BP:  131/69  Pulse:  (!) 104  Resp:  20  Temp:  98.4 F (36.9 C)  SpO2: 93% 94%   Allergies as of 09/13/2022   No Known Allergies      Medication List     STOP taking these medications    cephALEXin 500 MG capsule Commonly known as: KEFLEX   tamsulosin 0.4 MG Caps capsule Commonly known as: FLOMAX       TAKE these medications    abiraterone acetate 250 MG tablet Commonly known as: ZYTIGA Take 1,000 mg by mouth daily.   albuterol 108 (90 Base) MCG/ACT inhaler Commonly known as: Ventolin HFA INHALE 2 PUFFS BY MOUTH EVERY 4 HOURS AS NEEDED FOR WHEEZING OR SHORTNESS OF BREATH What changed:  how much to take how to take this when to take this reasons to take this additional instructions    albuterol 0.63 MG/3ML nebulizer solution Commonly known as: ACCUNEB USE 1 VIAL VIA NEBULIZER EVERY 6 HOURS What changed: See the new instructions.   fluticasone 50 MCG/ACT nasal spray Commonly known as: FLONASE Place 1 spray into both nostrils daily. What changed: when to take this   guaiFENesin 600 MG 12 hr tablet Commonly known as: MUCINEX Take 1 tablet (600 mg total) by mouth 2 (two) times daily.   ipratropium-albuterol 0.5-2.5 (3) MG/3ML Soln Commonly known as: DUONEB Take 3 mLs by nebulization every 6 (six) hours. What changed: additional instructions   loratadine 10 MG tablet Commonly known as: CLARITIN Take 1 tablet (10 mg total) by mouth daily.   OXYGEN Inhale 4-5 L/min into the lungs continuous.   OYSTER SHELL CALCIUM/D3 PO Take 1 tablet by mouth daily with breakfast. 600 mg   predniSONE 20 MG tablet Commonly known as: DELTASONE Take 3 tablets for 3 days then 2 tablets for 3 days then 1 tablet for 3 days then resume home dose prednisone of 10 mg daily. What changed:  how much to take how to take this when to take this additional instructions   predniSONE 20 MG tablet Commonly known as: DELTASONE Take 2 tablets (40 mg total) by mouth daily with breakfast for 1 day. Start taking on: Sep 14, 2022 What changed: You were already taking a medication with the same name, and this prescription was added. Make sure you understand how and when to take each.   predniSONE 10 MG tablet Commonly  known as: DELTASONE Take 3 tablets (30 mg total) by mouth daily with breakfast for 3 doses. Start taking on: Sep 15, 2022 What changed: You were already taking a medication with the same name, and this prescription was added. Make sure you understand how and when to take each.   predniSONE 20 MG tablet Commonly known as: DELTASONE Take 1 tablet (20 mg total) by mouth daily with breakfast for 3 doses. Start taking on: Sep 18, 2022 What changed: You were already taking a  medication with the same name, and this prescription was added. Make sure you understand how and when to take each.   traMADol 50 MG tablet Commonly known as: Ultram Take 1-2 tablets (50-100 mg total) by mouth every 6 (six) hours as needed for moderate pain or severe pain (post-operatively).   Trelegy Ellipta 100-62.5-25 MCG/ACT Aepb Generic drug: Fluticasone-Umeclidin-Vilant INHALE 1 PUFF INTO THE LUNGS DAILY What changed: how to take this               Durable Medical Equipment  (From admission, onward)           Start     Ordered   09/13/22 1056  For home use only DME oxygen  Once       Question Answer Comment  Length of Need Lifetime   Mode or (Route) Nasal cannula   Liters per Minute 6   Frequency Continuous (stationary and portable oxygen unit needed)   Oxygen conserving device Yes   Oxygen delivery system Gas      09/13/22 1055

## 2022-09-13 NOTE — TOC Progression Note (Addendum)
Transition of Care [facility]) - Progression Note    Patient Details  Name: Frank [name] MRN: [id number] Date of Birth: Jun 25, 1955  Transition of Care [facility]) CM/SW Contact  Howell Rucks, RN Phone Number: 09/13/2022, 9:08 AM  Clinical Narrative: Frances [facility]urbish rep on site- [place] [facility] [place], confirmed pt on service with [facility] PT prior to admit. Team/MD notified. Will continue to follow  - 11:22am  Met with pt at bedside, confirmed he has Home PT and will like to continue. HH PTFrances Furbish) order entered- MD to sign and place face to face. Home DME per pt- w/c, walker, potty chair, shower chair. Pt reports he lives alone, has support from his sister who will provide transport. Home Palliative Medicine referral placed with [facility]- rep Shanita. [employer] [facility]- rep Barbara Cower, to deliver home 02 to room prior to discharge. Will continue to follow.        Expected Discharge Plan: Home/Self Care Barriers to Discharge: No Barriers Identified  Expected Discharge Plan and Services   Discharge Planning Services: CM Consult     Expected Discharge Date: 09/09/22                                     Social Determinants of Health (SDOH) Interventions SDOH Screenings   Food Insecurity: No Food Insecurity (09/09/2022)  Housing: Low Risk  (09/09/2022)  Transportation Needs: No Transportation Needs (09/09/2022)  Recent Concern: Transportation Needs - Unmet Transportation Needs (08/08/2022)  Utilities: Not At Risk (09/09/2022)  Alcohol Screen: Low Risk  (07/17/2022)  Depression (PHQ2-9): Low Risk  (07/17/2022)  Financial Resource Strain: Low Risk  (07/17/2022)  Physical Activity: Inactive (07/17/2022)  Social Connections: Socially Isolated (07/17/2022)  Stress: No Stress Concern Present (07/17/2022)  Tobacco Use: Medium Risk (09/11/2022)    Readmission Risk Interventions    09/11/2022   12:32 PM 08/08/2022   10:30 AM  Readmission Risk Prevention Plan  Transportation Screening Complete Complete  PCP or Specialist Appt  within 5-7 Days  Complete  PCP or Specialist Appt within 3-5 Days Complete   Home Care Screening  Complete  Medication Review (RN CM)  Complete  HRI or Home Care Consult Complete   Social Work Consult for Recovery Care Planning/Counseling Complete   Palliative Care Screening Not Applicable   Medication Review Oceanographer) Complete

## 2022-09-13 NOTE — Progress Notes (Signed)
PROGRESS NOTE    Frank [name]  AVW:[id number] DOB: Dec 06, 1955 DOA: 09/08/2022 PCP: Crissie Sickles, MD    Brief Narrative:  67 year old with history of COPD and chronic hypoxemic respite failure on 4 L oxygen at home, he was admitted to urology service for TURP.  After procedure he had significant shortness of breath and increasing oxygen requirement hence admitted to the hospital and medicine consulted.  He does have baseline shortness of breath with exertion and chronic wheezing.  He is optimized on COPD medications at home.   Assessment & Plan:   Acute on chronic hypoxemic respiratory failure due to COPD exacerbation. Aggressive bronchodilator therapy, IV steroids to oral steroids taper, inhalational steroids, scheduled and as needed bronchodilators, deep breathing exercises, incentive spirometry, chest physiotherapy.  Mobilize in the hallway. Antibiotics currently not indicated. Supplemental oxygen to keep saturations more than 90%. Due to significant symptoms, CT angiogram ordered that is negative for pulmonary embolism and shows significant COPD changes. Patient does have advanced COPD, he may need more oxygen at home. He will be able to go home today if able to acquire bigger oxygen concentrator.  Prostate cancer status post TURP: Failed voiding trial.  Foley reinserted.  Possible home on Foley catheter.  Urology to schedule follow-up.  With some clinical improvement.  Will discharge with prednisone taper and he will resume his chronic prednisone therapy. I discussed with the patient about palliative care consultation, he may qualify for home hospice care for his COPD.  He is agreeable to palliative care referral as outpatient.   Thank you for involving [place] in this patient's care.  Medication reconciliation done.  He is able to go home today if able to arrange oxygen.  Communicated with primary team.  Will sign off once patient is discharged.   DVT prophylaxis: enoxaparin  (LOVENOX) injection 30 mg Start: 09/11/22 1800 SCDs Start: 09/08/22 1811   Code Status: Full code Family Communication: Sister on the phone. Disposition Plan: Status is: Inpatient Remains inpatient appropriate because: Possible discharge today.     Consultants:  TRH  Procedures:  TURP 5/3,   Antimicrobials:  None   Subjective:  Patient seen and examined.  He has some shortness of breath but he tells me this is his baseline.  Currently on 5 L oxygen at rest.  Sister on the phone.  Patient has all the devices that he needs at home including nebulizers.  Objective: Vitals:   09/12/22 2027 09/12/22 2144 09/13/22 0531 09/13/22 0851  BP: 126/80 121/80 115/72   Pulse: (!) 102 98 75   Resp: 19 20 18    Temp: 98.8 F (37.1 C) 98.2 F (36.8 C) 98.1 F (36.7 C)   TempSrc: Oral Oral Oral   SpO2: 95% 94% 97% 93%  Weight:      Height:        Intake/Output Summary (Last 24 hours) at 09/13/2022 1149 Last data filed at 09/13/2022 1000 Gross per 24 hour  Intake 1160 ml  Output 1325 ml  Net -165 ml    Filed Weights   09/08/22 1305  Weight: 40.2 kg    Examination:  General: Chronically sick looking but not in any distress.  Able to talk in complete sentences. Cardiovascular: S1-S2 normal.  Regular rate rhythm. Respiratory: Bilateral clear.  Occasional scattered expiratory wheezes.  Currently on 5 L oxygen. SpO2: 93 % O2 Flow Rate (L/min): 5 L/min FiO2 (%): 36 %  Gastrointestinal: Soft.  Nontender.  Bowel sound present. Ext: No edema or swelling. Neuro: Intact.  Foley catheter with clear urine.      Data Reviewed: I have personally reviewed following labs and imaging studies  CBC: Recent Labs  Lab 09/08/22 1707 09/09/22 1556 09/11/22 0541  WBC  --   --  12.2*  HGB 9.9* 9.4* 10.7*  HCT 33.0* 30.4* 34.0*  MCV  --   --  91.4  PLT  --   --  313    Basic Metabolic Panel: Recent Labs  Lab 09/10/22 1512 09/11/22 0541  NA 137 135  K 3.9 3.7  CL 94* 92*  CO2  34* 37*  GLUCOSE 237* 122*  BUN 25* 24*  CREATININE 0.70 0.61  CALCIUM 8.7* 9.0  MG 2.0 2.0  PHOS 2.6 2.6    GFR: Estimated Creatinine Clearance: 50.3 mL/min (by C-G formula based on SCr of 0.61 mg/dL). Liver Function Tests: No results for input(s): "AST", "ALT", "ALKPHOS", "BILITOT", "PROT", "ALBUMIN" in the last 168 hours. No results for input(s): "LIPASE", "AMYLASE" in the last 168 hours. No results for input(s): "AMMONIA" in the last 168 hours. Coagulation Profile: No results for input(s): "INR", "PROTIME" in the last 168 hours. Cardiac Enzymes: No results for input(s): "CKTOTAL", "CKMB", "CKMBINDEX", "TROPONINI" in the last 168 hours. BNP (last 3 results) No results for input(s): "PROBNP" in the last 8760 hours. HbA1C: No results for input(s): "HGBA1C" in the last 72 hours. CBG: No results for input(s): "GLUCAP" in the last 168 hours. Lipid Profile: No results for input(s): "CHOL", "HDL", "LDLCALC", "TRIG", "CHOLHDL", "LDLDIRECT" in the last 72 hours. Thyroid Function Tests: No results for input(s): "TSH", "T4TOTAL", "FREET4", "T3FREE", "THYROIDAB" in the last 72 hours. Anemia Panel: No results for input(s): "VITAMINB12", "FOLATE", "FERRITIN", "TIBC", "IRON", "RETICCTPCT" in the last 72 hours. Sepsis Labs: No results for input(s): "PROCALCITON", "LATICACIDVEN" in the last 168 hours.  No results found for this or any previous visit (from the past 240 hour(s)).       Radiology Studies: No results found.      Scheduled Meds:  Chlorhexidine Gluconate Cloth  6 each Topical Q0600   enoxaparin (LOVENOX) injection  30 mg Subcutaneous QPM   feeding supplement  237 mL Oral TID BM   fluticasone furoate-vilanterol  1 puff Inhalation Daily   And   umeclidinium bromide  1 puff Inhalation Daily   guaiFENesin  600 mg Oral BID   ipratropium-albuterol  3 mL Nebulization Q6H   loratadine  10 mg Oral Daily   [START ON 09/22/2022] predniSONE  10 mg Oral Q breakfast    predniSONE  40 mg Oral Q breakfast   Followed by   Melene Muller ON 09/15/2022] predniSONE  30 mg Oral Q breakfast   Followed by   Melene Muller ON 09/18/2022] predniSONE  20 mg Oral Q breakfast   Followed by   Melene Muller ON 09/21/2022] predniSONE  10 mg Oral Q breakfast   senna-docusate  1 tablet Oral BID   Continuous Infusions:  sodium chloride 20 mL/hr at 09/08/22 1851     LOS: 3 days    Time spent: 35 minutes    Dorcas Carrow, MD [facility] Pager [phone]

## 2022-09-13 NOTE — Consult Note (Signed)
   [facility] Inpatient Consult   09/13/2022  FIRAS REEM 10-26-1955 [id number]  [facility] [[facility]]  Accountable Care Organization [ACO] Patient: Frank [name] [place] Medicare  *[facility] Liaison remote coverage review for patient admitted to [facility]    Primary Care Provider:  Crissie Sickles, MD with [facility] Internal Medicine which is listed to provide the transition of care follow up   Patient screened for less than 30 days readmission hospitalization with noted high risk score for unplanned readmission risk and to assess for potential [facility]  [[facility]] Care Management service needs for post hospital transition for care coordination.  Review of patient's electronic medical record reveals patient is for home with [facility].  Spoke with the patient at the bedside phone and explained reason for call, he wanted his sister to speak as well, HIPAA verified, and spoke with [name], on DPR regarding post hospital follow up call from [facility] team. They agreed to [facility] follow up.  Plan:  Referral request for community care coordination: referral requested for follow up  Of note, [facility] Care Management/Population Health does not replace or interfere with any arrangements made by the Inpatient Transition of Care team.  For questions contact:   Charlesetta Shanks, RN BSN CCM [facility]  [phone] business mobile phone Toll free office [phone]  *Concierge Line  [phone] Fax number: [phone] [email or web address]

## 2022-09-13 NOTE — Progress Notes (Signed)
[facility] ([facility]) Hospital Liaison Note  Notified by TOC manager of patient/family request for [facility] palliative services at home after discharge.   [facility] hospital liaison will follow patient for discharge disposition.   Please call with any hospice or outpatient palliative care related questions.   Thank you for the opportunity to participate in this patient's care.   Shanita Wicker, LCSW [facility] Hospital Liaison [phone]  

## 2022-09-14 ENCOUNTER — Telehealth: Payer: Self-pay | Admitting: *Deleted

## 2022-09-14 ENCOUNTER — Other Ambulatory Visit: Payer: Self-pay

## 2022-09-14 ENCOUNTER — Telehealth: Payer: Self-pay

## 2022-09-14 DIAGNOSIS — Z515 Encounter for palliative care: Secondary | ICD-10-CM

## 2022-09-14 NOTE — Progress Notes (Signed)
  Care Coordination  Outreach Note  09/14/2022 Name: Frank [name] MRN: [id number] DOB: 1955-10-16   Care Coordination Outreach Attempts: An unsuccessful telephone outreach was attempted today to offer the patient information about available care coordination services.  Follow Up Plan:  Additional outreach attempts will be made to offer the patient care coordination information and services.   Encounter Outcome:  No Answer  Gwenevere Ghazi  Care Coordination Care Guide  Direct Dial: [phone]

## 2022-09-14 NOTE — Transitions of Care (Post Inpatient/ED Visit) (Signed)
09/14/2022  Name: Frank [name] MRN: [id number] DOB: [birth year]  Today's TOC FU Call Status: Today's TOC FU Call Status:: Successful TOC FU Call Competed TOC FU Call Complete Date: 09/14/22  Transition Care Management Follow-up Telephone Call Date of Discharge: 09/13/22 Discharge Facility: [facility]) Type of Discharge: Inpatient Admission Primary Inpatient Discharge Diagnosis:: Prostate Cancer-TURP How have you been since you were released from the hospital?: Better (Patient notes he feels very good today, ate well and is drinking fluids.) Any questions or concerns?: No  Items Reviewed: Did you receive and understand the discharge instructions provided?: Yes Medications obtained,verified, and reconciled?: Yes (Medications Reviewed) Any new allergies since your discharge?: No Dietary orders reviewed?: Yes Type of Diet Ordered:: Regular Do you have support at home?: Yes People in Home: sibling(s) Name of Support/Comfort Primary Source: Frank [name]  Medications Reviewed Today: Medications Reviewed Today     Reviewed by Jodelle Gross, RN (Case Manager) on 09/14/22 at [phone]  Med List Status: <None>   Medication Order Taking? Sig Documenting Provider Last Dose Status Informant  abiraterone acetate (ZYTIGA) 250 MG tablet [id number] No Take 1,000 mg by mouth daily. [provider] 09/08/2022 Active Spouse/Significant Other  albuterol (ACCUNEB) 0.63 MG/3ML nebulizer solution [id number] No USE 1 VIAL VIA NEBULIZER EVERY 6 HOURS  Patient taking differently: Take 1 ampule by nebulization every 6 (six) hours. COPD   Leslye Peer, MD 09/08/2022 Active Spouse/Significant Other  albuterol (VENTOLIN HFA) 108 (90 Base) MCG/ACT inhaler [id number] No INHALE 2 PUFFS BY MOUTH EVERY 4 HOURS AS NEEDED FOR WHEEZING OR SHORTNESS OF BREATH  Patient taking differently: Inhale 1 puff into the lungs every 4 (four) hours as needed for shortness of breath (COPD).   Luciano Cutter, MD Past Week  Active Spouse/Significant Other  Calcium Carb-Cholecalciferol (OYSTER SHELL CALCIUM/D3 PO) [id number] No Take 1 tablet by mouth daily with breakfast. 600 mg [provider] 08/05/2022 Active Spouse/Significant Other  fluticasone (FLONASE) 50 MCG/ACT nasal spray [id number] No Place 1 spray into both nostrils daily.  Patient taking differently: Place 1 spray into both nostrils 2 (two) times daily.   Leslye Peer, MD 08/05/2022 Active Spouse/Significant Other  Fluticasone-Umeclidin-Vilant (TRELEGY ELLIPTA) 100-62.5-25 MCG/ACT AEPB [id number] No INHALE 1 PUFF INTO THE LUNGS DAILY  Patient taking differently: Take 1 puff by mouth daily.   Luciano Cutter, MD 09/08/2022 Active Spouse/Significant Other  guaiFENesin (MUCINEX) 600 MG 12 hr tablet [id number]  Take 1 tablet (600 mg total) by mouth 2 (two) times daily. Regalado, Belkys A, MD  Active Spouse/Significant Other  ipratropium-albuterol (DUONEB) 0.5-2.5 (3) MG/3ML SOLN [id number]  Take 3 mLs by nebulization every 6 (six) hours.  Patient taking differently: Take 3 mLs by nebulization every 6 (six) hours. COPD   Regalado, Belkys A, MD  Active Spouse/Significant Other  loratadine (CLARITIN) 10 MG tablet [id number] No Take 1 tablet (10 mg total) by mouth daily. Alba Cory, MD 09/08/2022 Active Spouse/Significant Other  OXYGEN [id number] No Inhale 4-5 L/min into the lungs continuous. [provider] 09/08/2022 Active Spouse/Significant Other  predniSONE (DELTASONE) 10 MG tablet [id number]  Take 3 tablets (30 mg total) by mouth daily with breakfast for 3 doses. Dorcas Carrow, MD  Active   predniSONE (DELTASONE) 20 MG tablet [id number] No Take 3 tablets for 3 days then 2 tablets for 3 days then 1 tablet for 3 days then resume home dose prednisone of 10 mg daily.  Patient taking differently: Take 10 mg by mouth daily with breakfast.   Regalado,  Belkys A, MD 09/08/2022 Active Spouse/Significant Other  predniSONE (DELTASONE) 20 MG tablet [id number]   Take 2 tablets (40 mg total) by mouth daily with breakfast for 1 day. Dorcas Carrow, MD  Active   predniSONE (DELTASONE) 20 MG tablet [id number]  Take 1 tablet (20 mg total) by mouth daily with breakfast for 3 doses. Dorcas Carrow, MD  Active   traMADol Janean Sark) 50 MG tablet [id number]  Take 1-2 tablets (50-100 mg total) by mouth every 6 (six) hours as needed for moderate pain or severe pain (post-operatively). Loletta Parish., MD  Active             Home Care and Equipment/Supplies: Were Home Health Services Ordered?: Yes Name of Home Health Agency:: [facility] Has Agency set up a time to come to your home?: No EMR reviewed for Home Health Orders:  (Patient is a current patient prior to hospitalization) Any new equipment or medical supplies ordered?: Yes Name of Medical supply agency?: [employer]- O2 concentrator that goes up to 10L/M Were you able to get the equipment/medical supplies?: Yes Do you have any questions related to the use of the equipment/supplies?: No  Functional Questionnaire: Do you need assistance with bathing/showering or dressing?: No Do you need assistance with meal preparation?: No Do you need assistance with eating?: No Do you have difficulty maintaining continence: No Do you need assistance with getting out of bed/getting out of a chair/moving?: No Do you have difficulty managing or taking your medications?: No  Follow up appointments reviewed: PCP Follow-up appointment confirmed?: NA Specialist Hospital Follow-up appointment confirmed?: No Reason Specialist Follow-Up Not Confirmed: Patient has Specialist Provider Number and will Call for Appointment (Sister to call as she transports patient to appointments) Do you need transportation to your follow-up appointment?: No Do you understand care options if your condition(s) worsen?: Yes-patient verbalized understanding  SDOH Interventions Today    Flowsheet Row Most Recent Value  SDOH Interventions   Food  Insecurity Interventions Intervention Not Indicated  Housing Interventions Intervention Not Indicated  Utilities Interventions Intervention Not Indicated  Financial Strain Interventions Intervention Not Indicated      Interventions Today    Flowsheet Row Most Recent Value  Chronic Disease   Chronic disease during today's visit Chronic Obstructive Pulmonary Disease (COPD), Other  [Prostate Cancer/TURP]       TOC Interventions Today    Flowsheet Row Most Recent Value  TOC Interventions   TOC Interventions Discussed/Reviewed TOC Interventions Discussed, Contacted Home Health RN/OT/PT      Jodelle Gross, RN, BSN, CCM Care Management Coordinator [place] Phone: [phone]/Fax: [phone]

## 2022-09-14 NOTE — Telephone Encounter (Signed)
Spoke with pt letting him know the info per Dr. Delton Coombes and he verbalized understanding stating he was fine with order being placed for palliative care. Order has been placed. Nothing further needed.

## 2022-09-15 ENCOUNTER — Telehealth: Payer: Self-pay | Admitting: Emergency Medicine

## 2022-09-15 DIAGNOSIS — Z9181 History of falling: Secondary | ICD-10-CM | POA: Diagnosis not present

## 2022-09-15 DIAGNOSIS — C61 Malignant neoplasm of prostate: Secondary | ICD-10-CM | POA: Diagnosis not present

## 2022-09-15 DIAGNOSIS — N281 Cyst of kidney, acquired: Secondary | ICD-10-CM | POA: Diagnosis not present

## 2022-09-15 DIAGNOSIS — Z466 Encounter for fitting and adjustment of urinary device: Secondary | ICD-10-CM | POA: Diagnosis not present

## 2022-09-15 DIAGNOSIS — J441 Chronic obstructive pulmonary disease with (acute) exacerbation: Secondary | ICD-10-CM | POA: Diagnosis not present

## 2022-09-15 DIAGNOSIS — N401 Enlarged prostate with lower urinary tract symptoms: Secondary | ICD-10-CM | POA: Diagnosis not present

## 2022-09-15 DIAGNOSIS — Z9981 Dependence on supplemental oxygen: Secondary | ICD-10-CM | POA: Diagnosis not present

## 2022-09-15 DIAGNOSIS — Z7951 Long term (current) use of inhaled steroids: Secondary | ICD-10-CM | POA: Diagnosis not present

## 2022-09-15 DIAGNOSIS — Z7952 Long term (current) use of systemic steroids: Secondary | ICD-10-CM | POA: Diagnosis not present

## 2022-09-15 DIAGNOSIS — J9621 Acute and chronic respiratory failure with hypoxia: Secondary | ICD-10-CM | POA: Diagnosis not present

## 2022-09-15 DIAGNOSIS — J439 Emphysema, unspecified: Secondary | ICD-10-CM | POA: Diagnosis not present

## 2022-09-15 DIAGNOSIS — C7951 Secondary malignant neoplasm of bone: Secondary | ICD-10-CM | POA: Diagnosis not present

## 2022-09-15 DIAGNOSIS — N179 Acute kidney failure, unspecified: Secondary | ICD-10-CM | POA: Diagnosis not present

## 2022-09-15 DIAGNOSIS — N138 Other obstructive and reflux uropathy: Secondary | ICD-10-CM | POA: Diagnosis not present

## 2022-09-15 DIAGNOSIS — Z8616 Personal history of COVID-19: Secondary | ICD-10-CM | POA: Diagnosis not present

## 2022-09-15 NOTE — Progress Notes (Signed)
  Care Coordination   Note   09/15/2022 Name: HAROLDO ARNWINE MRN: [id number] DOB: Sep 06, 1955  QUSAY KOVACK is a 67 y.o. year old male who sees Crissie Sickles, MD for primary care. I reached out to [name] by phone today to offer care coordination services.  Mr. Matsuda was given information about Care Coordination services today including:   The Care Coordination services include support from the care team which includes your Nurse Coordinator, Clinical Social Worker, or Pharmacist.  The Care Coordination team is here to help remove barriers to the health concerns and goals most important to you. Care Coordination services are voluntary, and the patient may decline or stop services at any time by request to their care team member.   Care Coordination Consent Status: Patient sister [name] agreed to services and verbal consent obtained.   Follow up plan:  Telephone appointment with care coordination team member scheduled for:  09/20/22  Encounter Outcome:  Pt. Scheduled  [facility] Coordination Care Guide  Direct Dial: [phone]

## 2022-09-15 NOTE — Telephone Encounter (Signed)
Frank [name] states patient needs bigger oxygen tanks. Patient uses [facility]. Frank [name] phone number is [phone].

## 2022-09-15 NOTE — Progress Notes (Signed)
COMMUNITY PALLIATIVE CARE SW NOTE  PATIENT NAME: Frank [name] DOB: Sep 17, 1955 MRN: [id number]  PRIMARY CARE PROVIDER: Crissie Sickles, MD  RESPONSIBLE PARTY:  Acct ID - Guarantor Home Phone Work Phone Relationship Acct Type  [id number] BRAXTIN, FILIPOWICZ* [id number]  Self P/F     [address], [place], [place] [address]   Initial Palliative Care Encounter/Clinical Social Work  [employer] SW completed an initial telephonic encounter with patient's sister-Gloria. SW provided education to her regarding the palliative care program, role in patient's care, visit frequency and contact information. [name] provided an initial verbal consent to services and brief status update on patient.  She report that patient's intake is fair, where he is eating about 1-2 meals per day. He continues to have SOB despite her o2 being increased to 5 L. He utilizes a walker to ambulate. His sister is currently assisting him with personal care. He is continent of bowel and bladder.  SW scheduled a follow-up palliative care visit with patient for 09/25/22 @ 1pm.    Social History   Tobacco Use   Smoking status: Former    Packs/day: 0.50    Years: 35.00    Additional pack years: 0.00    Total pack years: 17.50    Types: Cigarettes    Quit date: 05/11/2015    Years since quitting: 7.3   Smokeless tobacco: Former    Types: Chew    Quit date: 05/08/1968  Substance Use Topics   Alcohol use: No    CODE STATUS: To be assessed ADVANCED DIRECTIVES: To be assessed MOST FORM COMPLETE:  To be assessed HOSPICE EDUCATION PROVIDED: Yes  Duration of encounter and documentation: 30 minutes  [employer], LCSW

## 2022-09-19 DIAGNOSIS — C7951 Secondary malignant neoplasm of bone: Secondary | ICD-10-CM | POA: Diagnosis not present

## 2022-09-19 DIAGNOSIS — Z9981 Dependence on supplemental oxygen: Secondary | ICD-10-CM | POA: Diagnosis not present

## 2022-09-19 DIAGNOSIS — J441 Chronic obstructive pulmonary disease with (acute) exacerbation: Secondary | ICD-10-CM | POA: Diagnosis not present

## 2022-09-19 DIAGNOSIS — Z7951 Long term (current) use of inhaled steroids: Secondary | ICD-10-CM | POA: Diagnosis not present

## 2022-09-19 DIAGNOSIS — Z9181 History of falling: Secondary | ICD-10-CM | POA: Diagnosis not present

## 2022-09-19 DIAGNOSIS — C61 Malignant neoplasm of prostate: Secondary | ICD-10-CM | POA: Diagnosis not present

## 2022-09-19 DIAGNOSIS — Z466 Encounter for fitting and adjustment of urinary device: Secondary | ICD-10-CM | POA: Diagnosis not present

## 2022-09-19 DIAGNOSIS — N179 Acute kidney failure, unspecified: Secondary | ICD-10-CM | POA: Diagnosis not present

## 2022-09-19 DIAGNOSIS — N138 Other obstructive and reflux uropathy: Secondary | ICD-10-CM | POA: Diagnosis not present

## 2022-09-19 DIAGNOSIS — Z7952 Long term (current) use of systemic steroids: Secondary | ICD-10-CM | POA: Diagnosis not present

## 2022-09-19 DIAGNOSIS — J9621 Acute and chronic respiratory failure with hypoxia: Secondary | ICD-10-CM | POA: Diagnosis not present

## 2022-09-19 DIAGNOSIS — J439 Emphysema, unspecified: Secondary | ICD-10-CM | POA: Diagnosis not present

## 2022-09-19 DIAGNOSIS — N281 Cyst of kidney, acquired: Secondary | ICD-10-CM | POA: Diagnosis not present

## 2022-09-19 DIAGNOSIS — Z8616 Personal history of COVID-19: Secondary | ICD-10-CM | POA: Diagnosis not present

## 2022-09-19 DIAGNOSIS — N401 Enlarged prostate with lower urinary tract symptoms: Secondary | ICD-10-CM | POA: Diagnosis not present

## 2022-09-19 NOTE — Telephone Encounter (Signed)
Spoke with April from [facility]. She advises patient needs bigger 02 tanks. He currently has the smaller portable tanks. April has spoken with [facility] and they just need an order for patient to receive bigger tanks.  Dr. Delton Coombes please advise

## 2022-09-19 NOTE — Telephone Encounter (Signed)
Ok to order whatever size tanks the patient needs

## 2022-09-20 ENCOUNTER — Telehealth: Payer: Self-pay

## 2022-09-20 ENCOUNTER — Other Ambulatory Visit: Payer: Self-pay

## 2022-09-20 DIAGNOSIS — J449 Chronic obstructive pulmonary disease, unspecified: Secondary | ICD-10-CM

## 2022-09-20 DIAGNOSIS — C61 Malignant neoplasm of prostate: Secondary | ICD-10-CM | POA: Diagnosis not present

## 2022-09-20 DIAGNOSIS — J9611 Chronic respiratory failure with hypoxia: Secondary | ICD-10-CM

## 2022-09-20 NOTE — Telephone Encounter (Signed)
Order has been placed for larger o2 tanks. NFN

## 2022-09-20 NOTE — Patient Outreach (Signed)
  Care Coordination   09/20/2022 Name: Frank [name] MRN: [id number] DOB: Oct 31, 1955   Care Coordination Outreach Attempts:  An unsuccessful telephone outreach was attempted today to offer the patient information about available care coordination services.  Follow Up Plan:  Additional outreach attempts will be made to offer the patient care coordination information and services.   Encounter Outcome:  No Answer   Care Coordination Interventions:  No, not indicated    Juanell Fairly RN, BSN, [facility] Care Coordinator [facility]   Phone: [phone]

## 2022-09-21 ENCOUNTER — Encounter (HOSPITAL_COMMUNITY): Payer: Self-pay | Admitting: Urology

## 2022-09-21 DIAGNOSIS — J439 Emphysema, unspecified: Secondary | ICD-10-CM | POA: Diagnosis not present

## 2022-09-21 DIAGNOSIS — Z7951 Long term (current) use of inhaled steroids: Secondary | ICD-10-CM | POA: Diagnosis not present

## 2022-09-21 DIAGNOSIS — N281 Cyst of kidney, acquired: Secondary | ICD-10-CM | POA: Diagnosis not present

## 2022-09-21 DIAGNOSIS — C61 Malignant neoplasm of prostate: Secondary | ICD-10-CM | POA: Diagnosis not present

## 2022-09-21 DIAGNOSIS — N401 Enlarged prostate with lower urinary tract symptoms: Secondary | ICD-10-CM | POA: Diagnosis not present

## 2022-09-21 DIAGNOSIS — Z8616 Personal history of COVID-19: Secondary | ICD-10-CM | POA: Diagnosis not present

## 2022-09-21 DIAGNOSIS — N138 Other obstructive and reflux uropathy: Secondary | ICD-10-CM | POA: Diagnosis not present

## 2022-09-21 DIAGNOSIS — N179 Acute kidney failure, unspecified: Secondary | ICD-10-CM | POA: Diagnosis not present

## 2022-09-21 DIAGNOSIS — C7951 Secondary malignant neoplasm of bone: Secondary | ICD-10-CM | POA: Diagnosis not present

## 2022-09-21 DIAGNOSIS — Z9181 History of falling: Secondary | ICD-10-CM | POA: Diagnosis not present

## 2022-09-21 DIAGNOSIS — Z7952 Long term (current) use of systemic steroids: Secondary | ICD-10-CM | POA: Diagnosis not present

## 2022-09-21 DIAGNOSIS — J441 Chronic obstructive pulmonary disease with (acute) exacerbation: Secondary | ICD-10-CM | POA: Diagnosis not present

## 2022-09-21 DIAGNOSIS — J9621 Acute and chronic respiratory failure with hypoxia: Secondary | ICD-10-CM | POA: Diagnosis not present

## 2022-09-21 DIAGNOSIS — Z466 Encounter for fitting and adjustment of urinary device: Secondary | ICD-10-CM | POA: Diagnosis not present

## 2022-09-21 DIAGNOSIS — Z9981 Dependence on supplemental oxygen: Secondary | ICD-10-CM | POA: Diagnosis not present

## 2022-09-25 ENCOUNTER — Other Ambulatory Visit: Payer: Self-pay

## 2022-09-25 ENCOUNTER — Encounter (HOSPITAL_COMMUNITY): Payer: Self-pay | Admitting: Internal Medicine

## 2022-09-25 ENCOUNTER — Inpatient Hospital Stay (HOSPITAL_COMMUNITY)
Admission: EM | Admit: 2022-09-25 | Discharge: 2022-09-30 | DRG: 871 | Disposition: A | Discharge status: Home / Self Care | Payer: Medicare Other | Attending: Internal Medicine | Admitting: Internal Medicine

## 2022-09-25 ENCOUNTER — Emergency Department (HOSPITAL_COMMUNITY): Payer: Medicare Other

## 2022-09-25 DIAGNOSIS — J9621 Acute and chronic respiratory failure with hypoxia: Secondary | ICD-10-CM | POA: Diagnosis present

## 2022-09-25 DIAGNOSIS — R000 Tachycardia, unspecified: Secondary | ICD-10-CM | POA: Diagnosis not present

## 2022-09-25 DIAGNOSIS — R0689 Other abnormalities of breathing: Secondary | ICD-10-CM | POA: Diagnosis not present

## 2022-09-25 DIAGNOSIS — Z825 Family history of asthma and other chronic lower respiratory diseases: Secondary | ICD-10-CM

## 2022-09-25 DIAGNOSIS — R319 Hematuria, unspecified: Secondary | ICD-10-CM | POA: Diagnosis present

## 2022-09-25 DIAGNOSIS — C61 Malignant neoplasm of prostate: Secondary | ICD-10-CM | POA: Diagnosis not present

## 2022-09-25 DIAGNOSIS — R9431 Abnormal electrocardiogram [ECG] [EKG]: Secondary | ICD-10-CM | POA: Diagnosis not present

## 2022-09-25 DIAGNOSIS — J440 Chronic obstructive pulmonary disease with acute lower respiratory infection: Secondary | ICD-10-CM | POA: Diagnosis present

## 2022-09-25 DIAGNOSIS — Z8616 Personal history of COVID-19: Secondary | ICD-10-CM

## 2022-09-25 DIAGNOSIS — J449 Chronic obstructive pulmonary disease, unspecified: Secondary | ICD-10-CM | POA: Diagnosis not present

## 2022-09-25 DIAGNOSIS — I4510 Unspecified right bundle-branch block: Secondary | ICD-10-CM | POA: Diagnosis not present

## 2022-09-25 DIAGNOSIS — D62 Acute posthemorrhagic anemia: Secondary | ICD-10-CM | POA: Diagnosis not present

## 2022-09-25 DIAGNOSIS — J9611 Chronic respiratory failure with hypoxia: Secondary | ICD-10-CM

## 2022-09-25 DIAGNOSIS — Z9981 Dependence on supplemental oxygen: Secondary | ICD-10-CM | POA: Diagnosis not present

## 2022-09-25 DIAGNOSIS — Z79899 Other long term (current) drug therapy: Secondary | ICD-10-CM | POA: Diagnosis not present

## 2022-09-25 DIAGNOSIS — A419 Sepsis, unspecified organism: Principal | ICD-10-CM | POA: Diagnosis present

## 2022-09-25 DIAGNOSIS — K029 Dental caries, unspecified: Secondary | ICD-10-CM | POA: Diagnosis present

## 2022-09-25 DIAGNOSIS — N401 Enlarged prostate with lower urinary tract symptoms: Secondary | ICD-10-CM | POA: Diagnosis not present

## 2022-09-25 DIAGNOSIS — R338 Other retention of urine: Secondary | ICD-10-CM | POA: Diagnosis not present

## 2022-09-25 DIAGNOSIS — R109 Unspecified abdominal pain: Secondary | ICD-10-CM | POA: Diagnosis not present

## 2022-09-25 DIAGNOSIS — T839XXA Unspecified complication of genitourinary prosthetic device, implant and graft, initial encounter: Secondary | ICD-10-CM

## 2022-09-25 DIAGNOSIS — C7951 Secondary malignant neoplasm of bone: Secondary | ICD-10-CM | POA: Diagnosis present

## 2022-09-25 DIAGNOSIS — Z7951 Long term (current) use of inhaled steroids: Secondary | ICD-10-CM | POA: Diagnosis not present

## 2022-09-25 DIAGNOSIS — J189 Pneumonia, unspecified organism: Secondary | ICD-10-CM | POA: Diagnosis not present

## 2022-09-25 DIAGNOSIS — J690 Pneumonitis due to inhalation of food and vomit: Secondary | ICD-10-CM | POA: Diagnosis present

## 2022-09-25 DIAGNOSIS — J9622 Acute and chronic respiratory failure with hypercapnia: Secondary | ICD-10-CM | POA: Diagnosis present

## 2022-09-25 DIAGNOSIS — R079 Chest pain, unspecified: Secondary | ICD-10-CM | POA: Diagnosis not present

## 2022-09-25 DIAGNOSIS — Z9079 Acquired absence of other genital organ(s): Secondary | ICD-10-CM

## 2022-09-25 DIAGNOSIS — Z8249 Family history of ischemic heart disease and other diseases of the circulatory system: Secondary | ICD-10-CM

## 2022-09-25 DIAGNOSIS — N201 Calculus of ureter: Secondary | ICD-10-CM | POA: Diagnosis not present

## 2022-09-25 DIAGNOSIS — T83091A Other mechanical complication of indwelling urethral catheter, initial encounter: Secondary | ICD-10-CM | POA: Diagnosis not present

## 2022-09-25 DIAGNOSIS — R0902 Hypoxemia: Secondary | ICD-10-CM | POA: Diagnosis not present

## 2022-09-25 DIAGNOSIS — J168 Pneumonia due to other specified infectious organisms: Secondary | ICD-10-CM | POA: Diagnosis not present

## 2022-09-25 DIAGNOSIS — R310 Gross hematuria: Secondary | ICD-10-CM | POA: Diagnosis not present

## 2022-09-25 DIAGNOSIS — R7989 Other specified abnormal findings of blood chemistry: Secondary | ICD-10-CM | POA: Diagnosis not present

## 2022-09-25 DIAGNOSIS — J439 Emphysema, unspecified: Secondary | ICD-10-CM | POA: Diagnosis not present

## 2022-09-25 DIAGNOSIS — Z87891 Personal history of nicotine dependence: Secondary | ICD-10-CM | POA: Diagnosis not present

## 2022-09-25 LAB — TROPONIN I (HIGH SENSITIVITY)
Troponin I (High Sensitivity): 10 ng/L (ref <18)
Troponin I (High Sensitivity): 9 ng/L (ref <18)

## 2022-09-25 LAB — CBC WITH DIFFERENTIAL/PLATELET
Abs Immature Granulocytes: 0.31 10*3/uL — ABNORMAL HIGH (ref 0.00–0.07)
Basophils Absolute: 0.2 10*3/uL — ABNORMAL HIGH (ref 0.0–0.1)
Basophils Relative: 1 %
Eosinophils Absolute: 0.2 10*3/uL (ref 0.0–0.5)
Eosinophils Relative: 1 %
HCT: 37.1 % — ABNORMAL LOW (ref 39.0–52.0)
Hemoglobin: 11.2 g/dL — ABNORMAL LOW (ref 13.0–17.0)
Immature Granulocytes: 2 %
Lymphocytes Relative: 8 %
Lymphs Abs: 1.5 10*3/uL (ref 0.7–4.0)
MCH: 28.6 pg (ref 26.0–34.0)
MCHC: 30.2 g/dL (ref 30.0–36.0)
MCV: 94.9 fL (ref 80.0–100.0)
Monocytes Absolute: 1.8 10*3/uL — ABNORMAL HIGH (ref 0.1–1.0)
Monocytes Relative: 9 %
Neutro Abs: 15.8 10*3/uL — ABNORMAL HIGH (ref 1.7–7.7)
Neutrophils Relative %: 79 %
Platelets: 242 10*3/uL (ref 150–400)
RBC: 3.91 MIL/uL — ABNORMAL LOW (ref 4.22–5.81)
RDW: 12.9 % (ref 11.5–15.5)
WBC: 19.7 10*3/uL — ABNORMAL HIGH (ref 4.0–10.5)
nRBC: 0 % (ref 0.0–0.2)

## 2022-09-25 LAB — URINALYSIS, ROUTINE W REFLEX MICROSCOPIC
Bacteria, UA: NONE SEEN
RBC / HPF: >50 RBC/hpf (ref 0–5)

## 2022-09-25 LAB — TYPE AND SCREEN
ABO/RH(D): O POS
Antibody Screen: NEGATIVE

## 2022-09-25 LAB — COMPREHENSIVE METABOLIC PANEL
ALT: 15 U/L (ref 0–44)
AST: 17 U/L (ref 15–41)
Albumin: 3.2 g/dL — ABNORMAL LOW (ref 3.5–5.0)
Alkaline Phosphatase: 78 U/L (ref 38–126)
Anion gap: 9 (ref 5–15)
BUN: 26 mg/dL — ABNORMAL HIGH (ref 8–23)
CO2: 37 mmol/L — ABNORMAL HIGH (ref 22–32)
Calcium: 9.1 mg/dL (ref 8.9–10.3)
Chloride: 91 mmol/L — ABNORMAL LOW (ref 98–111)
Creatinine, Ser: 0.69 mg/dL (ref 0.61–1.24)
GFR, Estimated: >60 mL/min (ref >60)
Glucose, Bld: 149 mg/dL — ABNORMAL HIGH (ref 70–99)
Potassium: 3.8 mmol/L (ref 3.5–5.1)
Sodium: 137 mmol/L (ref 135–145)
Total Bilirubin: 0.6 mg/dL (ref 0.3–1.2)
Total Protein: 6.9 g/dL (ref 6.5–8.1)

## 2022-09-25 LAB — D-DIMER, QUANTITATIVE: D-Dimer, Quant: 1.59 ug/mL-FEU — ABNORMAL HIGH (ref 0.00–0.50)

## 2022-09-25 LAB — ABO/RH: ABO/RH(D): O POS

## 2022-09-25 LAB — HEMOGLOBIN AND HEMATOCRIT, BLOOD
HCT: 30.2 % — ABNORMAL LOW (ref 39.0–52.0)
Hemoglobin: 9.4 g/dL — ABNORMAL LOW (ref 13.0–17.0)

## 2022-09-25 LAB — LIPASE, BLOOD: Lipase: 35 U/L (ref 11–51)

## 2022-09-25 LAB — LACTIC ACID, PLASMA: Lactic Acid, Venous: 1 mmol/L (ref 0.5–1.9)

## 2022-09-25 LAB — PROCALCITONIN: Procalcitonin: 0.26 ng/mL

## 2022-09-25 MED ORDER — IPRATROPIUM-ALBUTEROL 0.5-2.5 (3) MG/3ML IN SOLN
3.0000 mL | Freq: Four times a day (QID) | RESPIRATORY_TRACT | Status: DC
  Administered 2022-09-25 – 2022-09-27 (×7): 3 mL via RESPIRATORY_TRACT
  Filled 2022-09-25 (×7): qty 3

## 2022-09-25 MED ORDER — ACETAMINOPHEN 650 MG RE SUPP: 650.0000 mg | Freq: Four times a day (QID) | RECTAL | Status: DC | PRN

## 2022-09-25 MED ORDER — ONDANSETRON HCL 4 MG PO TABS: 4.0000 mg | ORAL_TABLET | Freq: Four times a day (QID) | ORAL | Status: DC | PRN

## 2022-09-25 MED ORDER — SODIUM CHLORIDE 0.9 % IR SOLN
3000.0000 mL | Status: DC
  Administered 2022-09-25 – 2022-09-26 (×7): 3000 mL

## 2022-09-25 MED ORDER — PREDNISONE 5 MG PO TABS
10.0000 mg | ORAL_TABLET | Freq: Every day | ORAL | Status: DC
  Administered 2022-09-26 – 2022-09-30 (×5): 10 mg via ORAL
  Filled 2022-09-25 (×5): qty 2

## 2022-09-25 MED ORDER — VANCOMYCIN HCL IN DEXTROSE 1-5 GM/200ML-% IV SOLN
1000.0000 mg | Freq: Two times a day (BID) | INTRAVENOUS | Status: DC
  Administered 2022-09-26: 1000 mg via INTRAVENOUS
  Filled 2022-09-25: qty 200

## 2022-09-25 MED ORDER — ONDANSETRON HCL 4 MG/2ML IJ SOLN: 4.0000 mg | Freq: Four times a day (QID) | INTRAMUSCULAR | Status: DC | PRN

## 2022-09-25 MED ORDER — SODIUM CHLORIDE 0.9 % IV BOLUS
500.0000 mL | Freq: Once | INTRAVENOUS | Status: AC
  Administered 2022-09-25: 500 mL via INTRAVENOUS

## 2022-09-25 MED ORDER — IOHEXOL 350 MG/ML SOLN
100.0000 mL | Freq: Once | INTRAVENOUS | Status: AC | PRN
  Administered 2022-09-25: 100 mL via INTRAVENOUS

## 2022-09-25 MED ORDER — ACETAMINOPHEN 325 MG PO TABS
650.0000 mg | ORAL_TABLET | Freq: Four times a day (QID) | ORAL | Status: DC | PRN
  Administered 2022-09-28: 650 mg via ORAL
  Filled 2022-09-25: qty 2

## 2022-09-25 MED ORDER — SODIUM CHLORIDE 0.9 % IV SOLN
500.0000 mg | INTRAVENOUS | Status: DC
  Administered 2022-09-25: 500 mg via INTRAVENOUS
  Filled 2022-09-25: qty 5

## 2022-09-25 MED ORDER — SODIUM CHLORIDE 0.9 % IV SOLN
2.0000 g | Freq: Once | INTRAVENOUS | Status: AC
  Administered 2022-09-25: 2 g via INTRAVENOUS
  Filled 2022-09-25: qty 12.5

## 2022-09-25 MED ORDER — SODIUM CHLORIDE 0.9 % IV SOLN
1000.0000 mL | INTRAVENOUS | Status: DC
  Administered 2022-09-25: 1000 mL via INTRAVENOUS

## 2022-09-25 MED ORDER — SODIUM CHLORIDE 0.9 % IV SOLN
2.0000 g | INTRAVENOUS | Status: AC
  Administered 2022-09-25 – 2022-09-29 (×5): 2 g via INTRAVENOUS
  Filled 2022-09-25 (×5): qty 20

## 2022-09-25 MED ORDER — VANCOMYCIN HCL IN DEXTROSE 1-5 GM/200ML-% IV SOLN
1000.0000 mg | Freq: Once | INTRAVENOUS | Status: AC
  Administered 2022-09-25: 1000 mg via INTRAVENOUS
  Filled 2022-09-25: qty 200

## 2022-09-25 MED ORDER — BUDESONIDE 0.5 MG/2ML IN SUSP
0.5000 mg | Freq: Two times a day (BID) | RESPIRATORY_TRACT | Status: DC
  Administered 2022-09-25 – 2022-09-30 (×10): 0.5 mg via RESPIRATORY_TRACT
  Filled 2022-09-25 (×10): qty 2

## 2022-09-25 MED ORDER — SODIUM CHLORIDE 0.9 % IV BOLUS (SEPSIS)
500.0000 mL | Freq: Once | INTRAVENOUS | Status: AC
  Administered 2022-09-25: 500 mL via INTRAVENOUS

## 2022-09-25 MED ORDER — ALBUTEROL SULFATE (2.5 MG/3ML) 0.083% IN NEBU
2.5000 mg | INHALATION_SOLUTION | RESPIRATORY_TRACT | Status: DC | PRN
  Administered 2022-09-26 – 2022-09-28 (×2): 2.5 mg via RESPIRATORY_TRACT
  Filled 2022-09-25 (×2): qty 3

## 2022-09-25 MED ORDER — SODIUM CHLORIDE 0.9 % IV SOLN
1000.0000 mL | INTRAVENOUS | Status: DC
  Administered 2022-09-25 – 2022-09-27 (×3): 1000 mL via INTRAVENOUS

## 2022-09-25 MED ORDER — ABIRATERONE ACETATE 250 MG PO TABS
1000.0000 mg | ORAL_TABLET | Freq: Every day | ORAL | Status: DC
  Administered 2022-09-27 – 2022-09-30 (×4): 1000 mg via ORAL

## 2022-09-25 NOTE — Progress Notes (Signed)
A consult was received from an ED physician for vancomycin per pharmacy dosing.  The patient's profile has been reviewed for ht/wt/allergies/indication/available labs.    A one time order has been placed for vancomycin 1000 mg IV.    Further antibiotics/pharmacy consults should be ordered by admitting physician if indicated.                       Thank you, Lynden Ang, PharmD, BCPS 09/25/2022  2:30 PM

## 2022-09-25 NOTE — ED Provider Notes (Signed)
[place] EMERGENCY DEPARTMENT AT [facility] Provider Note   CSN: [id number] Arrival date & time: 09/25/22  4098     History  Chief Complaint  Patient presents with   Hematuria    Frank [name] is a 67 y.o. male.   Hematuria   Patient has a history of COPD emphysema, kidney stones, lung cancer, metastatic prostate cancer indwelling Foley catheter, prostatic hypertrophy who presents to the ED with complaints of flank pain and blood in his urine.  Patient states he has had a catheter for a while now.  He said it off and on associated with prostate enlargement and urinary retention.  He was recently in the hospital on May 3 for urinary retention.  Patient has an indwelling Foley catheter.  He had a TURP procedure on May 3.  Patient was discharged on May 8.  Patient states the blood in his urine had decreased but he started noticed it again this morning.  Patient also feels like it may not be draining as well as it was initially.  He is also been having some pain in his left chest and flank area.Patient denies feeling more short of breath than usual.  He is chronically on oxygen at 5 L.  No fevers or chills no coughing    Home Medications Prior to Admission medications   Medication Sig Start Date End Date Taking? Authorizing Provider  abiraterone acetate (ZYTIGA) 250 MG tablet Take 1,000 mg by mouth daily. 08/08/22  Yes [provider]  albuterol (ACCUNEB) 0.63 MG/3ML nebulizer solution USE 1 VIAL VIA NEBULIZER EVERY 6 HOURS Patient taking differently: Take 1 ampule by nebulization every 6 (six) hours. COPD 08/29/22  Yes [name], Les Pou, MD  albuterol (VENTOLIN HFA) 108 (90 Base) MCG/ACT inhaler INHALE 2 PUFFS BY MOUTH EVERY 4 HOURS AS NEEDED FOR WHEEZING OR SHORTNESS OF BREATH Patient taking differently: Inhale 1 puff into the lungs every 4 (four) hours as needed for shortness of breath (COPD). 07/03/22  Yes Luciano Cutter, MD  Calcium Carb-Cholecalciferol (OYSTER  SHELL CALCIUM/D3 PO) Take 1 tablet by mouth daily with breakfast. 600 mg   Yes [provider]  fluticasone (FLONASE) 50 MCG/ACT nasal spray Place 1 spray into both nostrils daily. Patient taking differently: Place 1 spray into both nostrils 2 (two) times daily. 08/10/20  Yes Leslye Peer, MD  Fluticasone-Umeclidin-Vilant (TRELEGY ELLIPTA) 100-62.5-25 MCG/ACT AEPB INHALE 1 PUFF INTO THE LUNGS DAILY Patient taking differently: Take 1 puff by mouth daily. 07/03/22  Yes Luciano Cutter, MD  loratadine (CLARITIN) 10 MG tablet Take 1 tablet (10 mg total) by mouth daily. 08/12/22  Yes Regalado, Belkys A, MD  guaiFENesin (MUCINEX) 600 MG 12 hr tablet Take 1 tablet (600 mg total) by mouth 2 (two) times daily. Patient not taking: Reported on 09/25/2022 08/11/22   Regalado, Keirah Konitzer Billings A, MD  ipratropium-albuterol (DUONEB) 0.5-2.5 (3) MG/3ML SOLN Take 3 mLs by nebulization every 6 (six) hours. Patient taking differently: Take 3 mLs by nebulization every 6 (six) hours. COPD 08/11/22   Regalado, Belkys A, MD  OXYGEN Inhale 4-5 L/min into the lungs continuous.    [provider]  predniSONE (DELTASONE) 10 MG tablet Take 3 tablets (30 mg total) by mouth daily with breakfast for 3 doses. Patient not taking: Reported on 09/25/2022 09/15/22 09/18/22  Dorcas Carrow, MD  predniSONE (DELTASONE) 20 MG tablet Take 3 tablets for 3 days then 2 tablets for 3 days then 1 tablet for 3 days then resume home  dose prednisone of 10 mg daily. Patient not taking: Reported on 09/25/2022 08/11/22   Regalado, Vinal Rosengrant Billings A, MD  predniSONE (DELTASONE) 20 MG tablet Take 2 tablets (40 mg total) by mouth daily with breakfast for 1 day. Patient not taking: Reported on 09/25/2022 09/14/22 09/15/22  Dorcas Carrow, MD  predniSONE (DELTASONE) 20 MG tablet Take 1 tablet (20 mg total) by mouth daily with breakfast for 3 doses. Patient not taking: Reported on 09/25/2022 09/18/22 09/21/22  Dorcas Carrow, MD  traMADol (ULTRAM) 50 MG tablet Take 1-2  tablets (50-100 mg total) by mouth every 6 (six) hours as needed for moderate pain or severe pain (post-operatively). Patient not taking: Reported on 09/25/2022 09/08/22 09/08/23  Loletta Parish., MD      Allergies    Patient has no known allergies.    Review of Systems   Review of Systems  Genitourinary:  Positive for hematuria.    Physical Exam Updated Vital Signs BP (!) 127/90   Pulse (!) 119   Temp 98.9 F (37.2 C) (Oral)   Resp (!) 27   SpO2 91%  Physical Exam Vitals and nursing note reviewed.  Constitutional:      General: He is not in acute distress.    Appearance: He is well-developed. He is ill-appearing.  HENT:     Head: Normocephalic and atraumatic.     Right Ear: External ear normal.     Left Ear: External ear normal.  Eyes:     General: No scleral icterus.       Right eye: No discharge.        Left eye: No discharge.     Conjunctiva/sclera: Conjunctivae normal.  Neck:     Trachea: No tracheal deviation.  Cardiovascular:     Rate and Rhythm: Regular rhythm. Tachycardia present.  Pulmonary:     Effort: Pulmonary effort is normal. No respiratory distress.     Breath sounds: Normal breath sounds. Decreased air movement present. No stridor.     Comments: Decreased lung sounds bilaterally Abdominal:     General: Abdomen is flat. Bowel sounds are normal. There is no distension.     Palpations: Abdomen is soft. There is no mass.     Tenderness: There is no abdominal tenderness. There is left CVA tenderness. There is no guarding or rebound.  Genitourinary:    Comments: Foley catheter with bloody appearing urine, no gross clots noted Musculoskeletal:        General: No tenderness or deformity.     Cervical back: Neck supple.  Skin:    General: Skin is warm and dry.     Findings: No rash.  Neurological:     General: No focal deficit present.     Mental Status: He is alert.     Cranial Nerves: No cranial nerve deficit, dysarthria or facial asymmetry.      Sensory: No sensory deficit.     Motor: No abnormal muscle tone or seizure activity.     Coordination: Coordination normal.  Psychiatric:        Mood and Affect: Mood normal.     ED Results / Procedures / Treatments   Labs (all labs ordered are listed, but only abnormal results are displayed) Labs Reviewed  COMPREHENSIVE METABOLIC PANEL - Abnormal; Notable for the following components:      Result Value   Chloride 91 (*)    CO2 37 (*)    Glucose, Bld 149 (*)    BUN 26 (*)  Albumin 3.2 (*)    All other components within normal limits  CBC WITH DIFFERENTIAL/PLATELET - Abnormal; Notable for the following components:   WBC 19.7 (*)    RBC 3.91 (*)    Hemoglobin 11.2 (*)    HCT 37.1 (*)    Neutro Abs 15.8 (*)    Monocytes Absolute 1.8 (*)    Basophils Absolute 0.2 (*)    Abs Immature Granulocytes 0.31 (*)    All other components within normal limits  URINALYSIS, ROUTINE W REFLEX MICROSCOPIC - Abnormal; Notable for the following components:   Color, Urine RED (*)    APPearance TURBID (*)    Glucose, UA   (*)    Value: TEST NOT REPORTED DUE TO COLOR INTERFERENCE OF URINE PIGMENT   Hgb urine dipstick   (*)    Value: TEST NOT REPORTED DUE TO COLOR INTERFERENCE OF URINE PIGMENT   Bilirubin Urine   (*)    Value: TEST NOT REPORTED DUE TO COLOR INTERFERENCE OF URINE PIGMENT   Ketones, ur   (*)    Value: TEST NOT REPORTED DUE TO COLOR INTERFERENCE OF URINE PIGMENT   Protein, ur   (*)    Value: TEST NOT REPORTED DUE TO COLOR INTERFERENCE OF URINE PIGMENT   Nitrite   (*)    Value: TEST NOT REPORTED DUE TO COLOR INTERFERENCE OF URINE PIGMENT   Leukocytes,Ua   (*)    Value: TEST NOT REPORTED DUE TO COLOR INTERFERENCE OF URINE PIGMENT   All other components within normal limits  D-DIMER, QUANTITATIVE - Abnormal; Notable for the following components:   D-Dimer, Quant 1.59 (*)    All other components within normal limits  CULTURE, BLOOD (ROUTINE X 2)  CULTURE, BLOOD (ROUTINE X 2)   LIPASE, BLOOD  LACTIC ACID, PLASMA  LACTIC ACID, PLASMA  TROPONIN I (HIGH SENSITIVITY)  TROPONIN I (HIGH SENSITIVITY)    EKG None  Radiology CT Angio Chest PE W and/or Wo Contrast  Result Date: 09/25/2022 CLINICAL DATA:  Positive D-dimer. Hematuria. Shortness of breath at baseline. EXAM: CT ANGIOGRAPHY CHEST CT ABDOMEN AND PELVIS WITH CONTRAST TECHNIQUE: Multidetector CT imaging of the chest was performed using the standard protocol during bolus administration of intravenous contrast. Multiplanar CT image reconstructions and MIPs were obtained to evaluate the vascular anatomy. Multidetector CT imaging of the abdomen and pelvis was performed using the standard protocol during bolus administration of intravenous contrast. RADIATION DOSE REDUCTION: This exam was performed according to the departmental dose-optimization program which includes automated exposure control, adjustment of the mA and/or kV according to patient size and/or use of iterative reconstruction technique. CONTRAST:  OMNIPAQUE IOHEXOL 350 MG/ML SOLN COMPARISON:  CT chest 09/11/2022 Renal stone protocol CT 08/04/2022 FINDINGS: CTA CHEST FINDINGS Cardiovascular: Heart size within normal limits. Coronary artery calcifications are present. No acute abnormality of the thoracic aorta. Calcified atheromatous plaque seen scattered throughout the aortic arch and descending thoracic aorta. No pulmonary artery embolism. Mediastinum/Nodes: No enlarged mediastinal, hilar, or axillary lymph nodes. Thyroid gland, trachea, and esophagus demonstrate no significant findings. Lungs/Pleura: Severe emphysematous changes the lungs. Nodular airspace opacities again seen at the lung bases, similar to prior examination from 09/11/2022. Interval resolution of trace bilateral pleural effusions. Debris noted within the right mainstem bronchus and bronchus intermedius. Debris also noted within the left main bronchus extending into the lower lobe bronchus.  Musculoskeletal: Diffuse osseous metastatic disease again seen. Review of the MIP images confirms the above findings. CT ABDOMEN and PELVIS FINDINGS Hepatobiliary: No  focal liver abnormality is seen. No gallstones, gallbladder wall thickening, or biliary dilatation. Pancreas: Unremarkable. No pancreatic ductal dilatation or surrounding inflammatory changes. Spleen: Normal in size without focal abnormality. Adrenals/Urinary Tract: Adrenal glands are normal. 2.1 cm right lower pole and 1.2 cm left upper pole simple cysts do not require dedicated imaging follow-up. No hydronephrosis or hydroureter. Hyperdense content of the bladder most likely due to hemorrhagic blood products. Air within the bladder lumen is likely iatrogenic given presence of Foley catheter. Stomach/Bowel: Small hiatal hernia. No bowel dilatation to indicate ileus or obstruction. Normal appendix. Vascular/Lymphatic: No enlarged abdominal or pelvic lymph nodes. Atheromatous calcified plaque seen throughout the abdominal aorta and visualized arteries. Reproductive: The prostate is not well delineated and may be surgically absent. It is difficult to delineate between the borders of the bladder and prostate. Other: Small umbilical hernia containing short segment of nondilated bowel loop. Bilateral fat containing inguinal hernias, larger on the left. Musculoskeletal: No acute osseous abnormality. 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 Review of the MIP images confirms the above findings. IMPRESSION: 1. No pulmonary artery embolism. 2. Severe emphysematous changes of the lungs. 3. Nodular airspace opacities at the lung bases, similar to prior examination from 09/11/2022, and likely infectious/inflammatory in etiology. Findings May be the result of recurrent aspiration. 4. Debris noted within the right mainstem bronchus and bronchus intermedius.  Debris also noted within the left main bronchus extending into the lower lobe bronchus. 5. Interval development of hyperdense content of the bladder most likely due to hemorrhagic blood products. Prostate is not well delineated. 6. 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 Emphysema (ICD10-J43.9). Electronically Signed   By: Acquanetta Belling M.D.   On: 09/25/2022 13:22   CT ABDOMEN PELVIS W CONTRAST  Result Date: 09/25/2022 CLINICAL DATA:  Positive D-dimer. Hematuria. Shortness of breath at baseline. EXAM: CT ANGIOGRAPHY CHEST CT ABDOMEN AND PELVIS WITH CONTRAST TECHNIQUE: Multidetector CT imaging of the chest was performed using the standard protocol during bolus administration of intravenous contrast. Multiplanar CT image reconstructions and MIPs were obtained to evaluate the vascular anatomy. Multidetector CT imaging of the abdomen and pelvis was performed using the standard protocol during bolus administration of intravenous contrast. RADIATION DOSE REDUCTION: This exam was performed according to the departmental dose-optimization program which includes automated exposure control, adjustment of the mA and/or kV according to patient size and/or use of iterative reconstruction technique. CONTRAST:  OMNIPAQUE IOHEXOL 350 MG/ML SOLN COMPARISON:  CT chest 09/11/2022 Renal stone protocol CT 08/04/2022 FINDINGS: CTA CHEST FINDINGS Cardiovascular: Heart size within normal limits. Coronary artery calcifications are present. No acute abnormality of the thoracic aorta. Calcified atheromatous plaque seen scattered throughout the aortic arch and descending thoracic aorta. No pulmonary artery embolism. Mediastinum/Nodes: No enlarged mediastinal, hilar, or axillary lymph nodes. Thyroid gland, trachea, and esophagus demonstrate no significant findings. Lungs/Pleura: Severe emphysematous changes the lungs. Nodular airspace opacities again seen at the lung bases, similar to prior examination from 09/11/2022.  Interval resolution of trace bilateral pleural effusions. Debris noted within the right mainstem bronchus and bronchus intermedius. Debris also noted within the left main bronchus extending into the lower lobe bronchus. Musculoskeletal: Diffuse osseous metastatic disease again seen. Review of the MIP images confirms the above findings. CT ABDOMEN and PELVIS FINDINGS Hepatobiliary: No focal liver abnormality is seen. No gallstones, gallbladder wall thickening, or biliary dilatation. Pancreas: Unremarkable. No pancreatic ductal dilatation or surrounding inflammatory changes. Spleen: Normal  in size without focal abnormality. Adrenals/Urinary Tract: Adrenal glands are normal. 2.1 cm right lower pole and 1.2 cm left upper pole simple cysts do not require dedicated imaging follow-up. No hydronephrosis or hydroureter. Hyperdense content of the bladder most likely due to hemorrhagic blood products. Air within the bladder lumen is likely iatrogenic given presence of Foley catheter. Stomach/Bowel: Small hiatal hernia. No bowel dilatation to indicate ileus or obstruction. Normal appendix. Vascular/Lymphatic: No enlarged abdominal or pelvic lymph nodes. Atheromatous calcified plaque seen throughout the abdominal aorta and visualized arteries. Reproductive: The prostate is not well delineated and may be surgically absent. It is difficult to delineate between the borders of the bladder and prostate. Other: Small umbilical hernia containing short segment of nondilated bowel loop. Bilateral fat containing inguinal hernias, larger on the left. Musculoskeletal: No acute osseous abnormality. 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 Review of the MIP images confirms the above findings. IMPRESSION: 1. No pulmonary artery embolism. 2. Severe emphysematous changes of the lungs. 3. Nodular airspace opacities at the lung bases, similar  to prior examination from 09/11/2022, and likely infectious/inflammatory in etiology. Findings May be the result of recurrent aspiration. 4. Debris noted within the right mainstem bronchus and bronchus intermedius. Debris also noted within the left main bronchus extending into the lower lobe bronchus. 5. Interval development of hyperdense content of the bladder most likely due to hemorrhagic blood products. Prostate is not well delineated. 6. 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 Emphysema (ICD10-J43.9). Electronically Signed   By: Acquanetta Belling M.D.   On: 09/25/2022 13:22   DG Chest Portable 1 View  Result Date: 09/25/2022 CLINICAL DATA:  Tachycardia, flank pain and hematuria. EXAM: PORTABLE CHEST 1 VIEW COMPARISON:  09/09/2022 FINDINGS: The heart size and mediastinal contours are within normal limits. Stable advanced emphysematous lung disease. There is no evidence of pulmonary edema, consolidation, pneumothorax or pleural fluid. The visualized skeletal structures are unremarkable. IMPRESSION: Stable advanced emphysematous lung disease. No acute findings. Electronically Signed   By: Irish Lack M.D.   On: 09/25/2022 10:19    Procedures .Critical Care  Performed by: Linwood Dibbles, MD Authorized by: Linwood Dibbles, MD   Critical care provider statement:    Critical care time (minutes):  30   Critical care was time spent personally by me on the following activities:  Development of treatment plan with patient or surrogate, discussions with consultants, evaluation of patient's response to treatment, examination of patient, ordering and review of laboratory studies, ordering and review of radiographic studies, ordering and performing treatments and interventions, pulse oximetry, re-evaluation of patient's condition and review of old charts     Medications Ordered in ED Medications  sodium chloride 0.9 % bolus 500 mL (0 mLs Intravenous Stopped 09/25/22 1337)    Followed by  0.9 %  sodium  chloride infusion (1,000 mLs Intravenous New Bag/Given 09/25/22 1030)  ceFEPIme (MAXIPIME) 2 g in sodium chloride 0.9 % 100 mL IVPB (has no administration in time range)  sodium chloride 0.9 % bolus 500 mL (has no administration in time range)  vancomycin (VANCOCIN) IVPB 1000 mg/200 mL premix (has no administration in time range)  iohexol (OMNIPAQUE) 350 MG/ML injection 100 mL (100 mLs Intravenous Contrast Given 09/25/22 1234)    ED Course/ Medical Decision Making/ A&P Clinical Course as of 09/25/22 1444  Mon Sep 25, 2022  1111 D-dimer, quantitative(!) D-dimer elevated 1.59 [JK]  1111 CBC with Diff(!) Leukocytosis  noted white count of 19.7 [JK]  1111 Chest x-ray shows stable emphysematous changes [JK]  1408 CT angiogram does not show any evidence of pulmonary embolism.  Persistent nodular airspace opacities noted on CT scan similar to May 6.  Debris the bladder noted to be likely blood products [JK]    Clinical Course User Index [JK] Linwood Dibbles, MD                             Medical Decision Making Problems Addressed: Complication of Foley catheter, initial encounter [facility]): acute illness or injury that poses a threat to life or bodily functions Gross hematuria: acute illness or injury that poses a threat to life or bodily functions Pneumonia due to infectious organism, unspecified laterality, unspecified part of lung: acute illness or injury that poses a threat to life or bodily functions  Amount and/or Complexity of Data Reviewed Labs: ordered. Decision-making details documented in ED Course. Radiology: ordered and independent interpretation performed.  Risk Prescription drug management. Decision regarding hospitalization.   Patient presented to the ED for evaluation of hematuria.  Patient has history of metastatic prostate cancer.  Has an indwelling Foley catheter.  Patient had recurrence of his hematuria.  Was a component of Foley catheter dysfunction.  Patient's catheter was  not draining properly it was replaced and a three-way catheter was inserted.  Patient had continuous bladder irrigation and he has noted some improvement in his discomfort.  Patient was noted to be persistently tachycardic.  Initially thought it could be related to his pain and discomfort from the Foley but it has persisted despite improvement in his bladder discomfort.  Patient had a CT angiogram to evaluate for the possibility of pulmonary embolism.  I also included his abdomen considering the flank discomfort he had.  CT scan does not show evidence of PE or acute pathology in his abdomen other than the debris noted in his bladder.  Patient does have a leukocytosis, he states he is not currently on steroids.  No signs of myocardial ischemia.  I suspect his inflammatory changes in the lungs could be a recurrent lung infection.  This may be contributing to his leukocytosis and tachycardia.  Will add on lactic acid and blood cultures.  Patient started on IV antibiotics.  I discussed the case with Dr. Robb Matar regarding admission and further treatment        Final Clinical Impression(s) / ED Diagnoses Final diagnoses:  Pneumonia due to infectious organism, unspecified laterality, unspecified part of lung  Gross hematuria  Complication of Foley catheter, initial encounter [facility])    Rx / DC Orders ED Discharge Orders     None         Linwood Dibbles, MD 09/25/22 1444

## 2022-09-25 NOTE — ED Triage Notes (Signed)
Pt BIBA with c/o hematuria. Went to urology last week, was advised hematuria is normal now. Family still concerned with hematuria so they called EMS. SOB at baseline when walking to EMS truck. Usually uses an inhaler after walking. Given a neb treatment en route. On 5L [place] at home.  HR 120 after duoneb 97% on duoneb

## 2022-09-25 NOTE — H&P (Signed)
History and Physical    Patient: Frank [name] HYQ:[id number] DOB: [birth year] DOA: 09/25/2022 DOS: the patient was seen and examined on 09/25/2022 PCP: Crissie Sickles, MD  Patient coming from: Home  Chief Complaint:  Chief Complaint  Patient presents with   Hematuria   HPI: Frank [name] is a 67 y.o. male with medical history significant of osteoarthritis, COPD/emphysema with chronic respiratory failure with hypoxia on home oxygen at 5 LPM via [place], nephrolithiasis, history of pneumonia, tobacco abuse, COVID-19, diverticulosis, colon polyps, prostate cancer metastatic to bone, urinary retention with indwelling Foley catheter, who underwent TURP on 09/08/2022 with Dr. Berneice Heinrich with pathology report showing prostatic adenocarcinoma.  Postprocedure, he had to stay in the hospital for an additional 5 days due to decompensated respiratory status requiring beta agonist nebs, glucocorticoids and aggressive pulmonary toilet.  He returns today due to hematuria, but was noticed to be persistently tachycardic and tachypneic in the emergency department.  He stated just over the weekend and his urine was of a lemonade color and nonbloody.  No flank pain, dysuria or frequency.  He denied fever, chills, rhinorrhea, sore throat or hemoptysis.  No chest pain, palpitations, diaphoresis, PND, orthopnea or recent pitting edema of the lower extremities.  No abdominal pain, nausea, emesis, diarrhea, constipation, melena or hematochezia. No polyuria, polydipsia, polyphagia or blurred vision.   ED course: Initial vital signs were temperature 99.1 F, pulse 123, respirations 22, BP 128/76 and O2 sat 94% on 4 LPM via nasal cannula.  He received 500 mL NS bolus, cefepime 2 g and vancomycin 1000 mg IVPB.  A three-way catheter has been placed.  I have ordered continuous bladder irrigation.  Lab work: CBC showed a white count of 19.7 with 79% neutrophils, hemoglobin 11.3 g/dL and platelets 952.  D-dimer was 1.59.  Troponin x 2,  lipase and lactic acid were normal.  CMP showed sodium 137, potassium 3.9, chloride 91 and CO2 37 mmol/L.  Glucose 149 and BUN 26 mg/dL, albumin 3.2 g/dL.  The rest of the hepatic functions, creatinine and calcium were normal.  Imaging: Portable 1 view chest radiograph with stable advanced emphysematous lung disease.  No acute findings.  CTA chest with no PE.  Severe emphysematous changes of the lungs.  There is nodular airspace opacities at the lung bases similar to prior examination from 09/11/2022.  Findings may be the result of recurrent aspiration.  Debris is noted within the right mainstem bronchus and bronchus intermedius.  Also noted within the left main bronchus extending into the lower lobe bronchus.  CT abdomen/pelvis with contrast showing that there is interval development hyperdense content of the bladder most likely due to hemorrhagic blood products.  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  There is a small umbilical hernia containing short segment of nondilated bowel loops.  Bilateral fat-containing inguinal hernias, larger on the left.   Review of Systems: As mentioned in the history of present illness. All other systems reviewed and are negative. Past Medical History:  Diagnosis Date   Arthritis    Cancer (HCC)    COPD (chronic obstructive pulmonary disease) (HCC) 06/05/2012   Dyspnea    Emphysema 06/07/2012   Heart murmur    hx of as baby   History of kidney stones    Hypoxemia 06/07/2012   Obstructive chronic bronchitis with exacerbation (HCC) 06/07/2012   Pneumonia    Tobacco abuse 06/05/2012   Past Surgical History:  Procedure Laterality Date   CATARACT EXTRACTION, BILATERAL     CYSTOSCOPY WITH  LITHOLAPAXY N/A 01/13/2022   Procedure: CYSTOSCOPY WITH LITHOLAPAXY;  Surgeon: Sebastian Ache, MD;  Location: [facility];  Service: Urology;  Laterality: N/A;   CYSTOSCOPY/RETROGRADE/URETEROSCOPY/STONE EXTRACTION WITH BASKET Right 10/04/2016   Procedure:  CYSTOSCOPY/RETROGRADE/URETEROSCOPY/STONE EXTRACTION WITH BASKET/ STENT;  Surgeon: Sebastian Ache, MD;  Location: [facility];  Service: Urology;  Laterality: Right;  With LASER   CYSTOSCOPY/URETEROSCOPY/HOLMIUM LASER/STENT PLACEMENT Right 01/13/2022   Procedure: CYSTOSCOPY/URETEROSCOPY/HOLMIUM LASER/STENT PLACEMENT;  Surgeon: Sebastian Ache, MD;  Location: [facility];  Service: Urology;  Laterality: Right;   EXTRACORPOREAL SHOCK WAVE LITHOTRIPSY Right 06/29/2016   Procedure: RIGHT EXTRACORPOREAL SHOCK WAVE LITHOTRIPSY (ESWL);  Surgeon: Crist Fat, MD;  Location: [facility];  Service: Urology;  Laterality: Right;   TRANSURETHRAL RESECTION OF PROSTATE N/A 09/08/2022   Procedure: TRANSURETHRAL RESECTION OF THE PROSTATE (TURP);  Surgeon: Loletta Parish., MD;  Location: [facility];  Service: Urology;  Laterality: N/A;  74 MINS   Social History:  reports that he quit smoking about 7 years ago. His smoking use included cigarettes. He has a 17.50 pack-year smoking history. He quit smokeless tobacco use about 54 years ago.  His smokeless tobacco use included chew. He reports that he does not drink alcohol and does not use drugs.  No Known Allergies  Family History  Problem Relation Age of Onset   COPD Mother    Colon polyps Father    Heart attack Father 72   Colon polyps Sister     Prior to Admission medications   Medication Sig Start Date End Date Taking? Authorizing Provider  abiraterone acetate (ZYTIGA) 250 MG tablet Take 1,000 mg by mouth daily. 08/08/22  Yes [provider]  albuterol (ACCUNEB) 0.63 MG/3ML nebulizer solution USE 1 VIAL VIA NEBULIZER EVERY 6 HOURS Patient taking differently: Take 1 ampule by nebulization every 6 (six) hours. COPD 08/29/22  Yes [name], Les Pou, MD  albuterol (VENTOLIN HFA) 108 (90 Base) MCG/ACT inhaler INHALE 2 PUFFS BY MOUTH EVERY 4 HOURS AS NEEDED FOR WHEEZING OR SHORTNESS OF BREATH Patient taking differently: Inhale 1 puff into the lungs every 4 (four) hours  as needed for shortness of breath (COPD). 07/03/22  Yes Luciano Cutter, MD  Calcium Carb-Cholecalciferol (OYSTER SHELL CALCIUM/D3 PO) Take 1 tablet by mouth daily with breakfast. 600 mg   Yes [provider]  fluticasone (FLONASE) 50 MCG/ACT nasal spray Place 1 spray into both nostrils daily. Patient taking differently: Place 1 spray into both nostrils 2 (two) times daily. 08/10/20  Yes Leslye Peer, MD  Fluticasone-Umeclidin-Vilant (TRELEGY ELLIPTA) 100-62.5-25 MCG/ACT AEPB INHALE 1 PUFF INTO THE LUNGS DAILY Patient taking differently: Take 1 puff by mouth daily. 07/03/22  Yes Luciano Cutter, MD  loratadine (CLARITIN) 10 MG tablet Take 1 tablet (10 mg total) by mouth daily. 08/12/22  Yes Regalado, Belkys A, MD  guaiFENesin (MUCINEX) 600 MG 12 hr tablet Take 1 tablet (600 mg total) by mouth 2 (two) times daily. Patient not taking: Reported on 09/25/2022 08/11/22   Regalado, Jon Billings A, MD  ipratropium-albuterol (DUONEB) 0.5-2.5 (3) MG/3ML SOLN Take 3 mLs by nebulization every 6 (six) hours. Patient taking differently: Take 3 mLs by nebulization every 6 (six) hours. COPD 08/11/22   Regalado, Belkys A, MD  OXYGEN Inhale 4-5 L/min into the lungs continuous.    [provider]  predniSONE (DELTASONE) 10 MG tablet Take 3 tablets (30 mg total) by mouth daily with breakfast for 3 doses. Patient not taking: Reported on 09/25/2022 09/15/22 09/18/22  Dorcas Carrow, MD  predniSONE (  DELTASONE) 20 MG tablet Take 3 tablets for 3 days then 2 tablets for 3 days then 1 tablet for 3 days then resume home dose prednisone of 10 mg daily. Patient not taking: Reported on 09/25/2022 08/11/22   Regalado, Jon Billings A, MD  predniSONE (DELTASONE) 20 MG tablet Take 2 tablets (40 mg total) by mouth daily with breakfast for 1 day. Patient not taking: Reported on 09/25/2022 09/14/22 09/15/22  Dorcas Carrow, MD  predniSONE (DELTASONE) 20 MG tablet Take 1 tablet (20 mg total) by mouth daily with breakfast for 3 doses. Patient  not taking: Reported on 09/25/2022 09/18/22 09/21/22  Dorcas Carrow, MD  traMADol (ULTRAM) 50 MG tablet Take 1-2 tablets (50-100 mg total) by mouth every 6 (six) hours as needed for moderate pain or severe pain (post-operatively). Patient not taking: Reported on 09/25/2022 09/08/22 09/08/23  Loletta Parish., MD    Physical Exam: Vitals:   09/25/22 1230 09/25/22 1309 09/25/22 1330 09/25/22 1430  BP: 123/62  (!) 127/90 (!) 108/58  Pulse: (!) 123  (!) 119 (!) 113  Resp: (!) 21  (!) 27 (!) 29  Temp:  98.9 F (37.2 C)    TempSrc:  Oral    SpO2: 91%  91% 93%   Physical Exam Constitutional:      General: He is awake.     Appearance: He is ill-appearing.     Interventions: Nasal cannula in place.  HENT:     Head: Normocephalic.     Nose: No rhinorrhea.     Mouth/Throat:     Mouth: Mucous membranes are dry.     Dentition: Abnormal dentition. Dental caries and gum lesions present.  Eyes:     General: No scleral icterus.    Pupils: Pupils are equal, round, and reactive to light.  Neck:     Vascular: No JVD.  Cardiovascular:     Rate and Rhythm: Regular rhythm. Tachycardia present. Occasional Extrasystoles are present.    Heart sounds: S1 normal and S2 normal.  Pulmonary:     Effort: Pulmonary effort is normal.     Breath sounds: Normal breath sounds.  Abdominal:     General: Bowel sounds are normal.     Palpations: Abdomen is soft.  Musculoskeletal:     Cervical back: Neck supple.     Right lower leg: No edema.     Left lower leg: No edema.  Skin:    General: Skin is warm and dry.  Neurological:     General: No focal deficit present.     Mental Status: He is alert and oriented to person, place, and time.  Psychiatric:        Mood and Affect: Mood normal.        Behavior: Behavior normal. Behavior is cooperative.   Data Reviewed:  There are no new results to review at this time.  Vent. rate 115 BPM PR interval 138 ms QRS duration 115 ms QT/QTcB 334/462 ms P-R-T axes  70 -54 37 Sinus tachycardia Right bundle branch block  Assessment and Plan: Principal Problem:   Acute blood loss anemia (ABLA) Due to:   Hematuria In the setting of to:   Prostate cancer metastatic to bone (HCC)  Admit to PCU/inpatient. Continue IV fluids NS at 75 mL/h. Started on CIB. Check type and screen. Monitor H&H. Transfuse as needed. On ceftriaxone for pneumonia. Continue Zytiga 1000 mg p.o. daily. Continue prednisone 10 mg p.o. daily. Briefly discussed with urology (Dr. Berneice Heinrich). Notify urology if bleeding persist.  Active Problems:   Acute on chronic respiratory failure with hypoxia and hypercapnia (HCC) In the setting of:   COPD (chronic obstructive pulmonary disease) (HCC) With superimposed:   Multifocal pneumonia  Continue supplemental oxygen. Scheduled and as needed bronchodilators. Continue ceftriaxone 1 g IVPB daily. Continue azithromycin 500 mg IVPB daily. Vancomycin per pharmacy. Nasal swab for MRSA PCR. Check strep pneumoniae urinary antigen. Check sputum Gram stain, culture and sensitivity. Follow-up blood culture and sensitivity. Follow-up CBC and chemistry in the morning.    Advance Care Planning:   Code Status: Full Code   Consults:   Family Communication: His wife was at bedside.  Severity of Illness: The appropriate patient status for this patient is OBSERVATION. Observation status is judged to be reasonable and necessary in order to provide the required intensity of service to ensure the patient's safety. The patient's presenting symptoms, physical exam findings, and initial radiographic and laboratory data in the context of their medical condition is felt to place them at decreased risk for further clinical deterioration. Furthermore, it is anticipated that the patient will be medically stable for discharge from the hospital within 2 midnights of admission.   Author: Bobette Mo, MD 09/25/2022 3:01 PM  For on call review  [email or web address].   This document was prepared using Dragon voice recognition software and may contain some unintended transcription errors.

## 2022-09-25 NOTE — Progress Notes (Signed)
Pharmacy Antibiotic Note  Frank [name] is a 67 y.o. male admitted on 09/25/2022 with pneumonia.  Pharmacy has been consulted for vancomycin dosing.  In ED, vancomycin 1000 mg IV x 1 administered  Plan: Continue vancomycin 1000 mg IV every 12 hours (Goal AUC 400-500, eAUC 541.2, SCr used: rounded up to 0.8) Monitor clinical progress, renal function, vancomycin levels as indicated F/U C&S, abx deescalation / LOT   Height: 5\' 8"  (172.7 cm) (Per Pt) Weight: 68.1 kg (150 lb 2.1 oz) IBW/kg (Calculated) : 68.4  Temp (24hrs), Avg:99 F (37.2 C), Min:98.9 F (37.2 C), Max:99.1 F (37.3 C)  Recent Labs  Lab 09/25/22 1029 09/25/22 1710  WBC 19.7*  --   CREATININE 0.69  --   LATICACIDVEN  --  1.0    Estimated Creatinine Clearance: 85.1 mL/min (by C-G formula based on SCr of 0.69 mg/dL).    No Known Allergies  Antimicrobials this admission: 5/20 cefepime x 1 5/20 azithromycin >>  5/20 ceftriaxone >>  5/20 vancomycin>>  Microbiology results: 5/20 BCx: ordered 5/20 MRSA PCR: needs to be collected  Thank you for allowing pharmacy to be a part of this patient's care.  Selinda Eon, PharmD, BCPS Clinical Pharmacist [place] Please utilize Amion for appropriate phone number to reach the unit pharmacist [facility]) 09/25/2022 7:21 PM

## 2022-09-26 ENCOUNTER — Inpatient Hospital Stay (HOSPITAL_COMMUNITY): Payer: Medicare Other

## 2022-09-26 DIAGNOSIS — R9431 Abnormal electrocardiogram [ECG] [EKG]: Secondary | ICD-10-CM

## 2022-09-26 DIAGNOSIS — T839XXA Unspecified complication of genitourinary prosthetic device, implant and graft, initial encounter: Secondary | ICD-10-CM | POA: Diagnosis not present

## 2022-09-26 DIAGNOSIS — J189 Pneumonia, unspecified organism: Secondary | ICD-10-CM | POA: Diagnosis not present

## 2022-09-26 LAB — ECHOCARDIOGRAM COMPLETE
AR max vel: 3.36 cm2
AV Area VTI: 2.74 cm2
AV Area mean vel: 2.98 cm2
AV Mean grad: 4 mmHg
AV Peak grad: 6.7 mmHg
Ao pk vel: 1.29 m/s
Area-P 1/2: 2.73 cm2
Height: 68 in
S' Lateral: 3.3 cm
Weight: 2402.13 oz

## 2022-09-26 LAB — CBC
HCT: 26.4 % — ABNORMAL LOW (ref 39.0–52.0)
Hemoglobin: 8 g/dL — ABNORMAL LOW (ref 13.0–17.0)
MCH: 28.8 pg (ref 26.0–34.0)
MCHC: 30.3 g/dL (ref 30.0–36.0)
MCV: 95 fL (ref 80.0–100.0)
Platelets: 174 10*3/uL (ref 150–400)
RBC: 2.78 MIL/uL — ABNORMAL LOW (ref 4.22–5.81)
RDW: 13.2 % (ref 11.5–15.5)
WBC: 14.7 10*3/uL — ABNORMAL HIGH (ref 4.0–10.5)
nRBC: 0 % (ref 0.0–0.2)

## 2022-09-26 LAB — COMPREHENSIVE METABOLIC PANEL
ALT: 12 U/L (ref 0–44)
AST: 14 U/L — ABNORMAL LOW (ref 15–41)
Albumin: 2.4 g/dL — ABNORMAL LOW (ref 3.5–5.0)
Alkaline Phosphatase: 56 U/L (ref 38–126)
Anion gap: 5 (ref 5–15)
BUN: 20 mg/dL (ref 8–23)
CO2: 36 mmol/L — ABNORMAL HIGH (ref 22–32)
Calcium: 8.1 mg/dL — ABNORMAL LOW (ref 8.9–10.3)
Chloride: 95 mmol/L — ABNORMAL LOW (ref 98–111)
Creatinine, Ser: 0.66 mg/dL (ref 0.61–1.24)
GFR, Estimated: >60 mL/min (ref >60)
Glucose, Bld: 99 mg/dL (ref 70–99)
Potassium: 3.7 mmol/L (ref 3.5–5.1)
Sodium: 136 mmol/L (ref 135–145)
Total Bilirubin: 0.9 mg/dL (ref 0.3–1.2)
Total Protein: 5.3 g/dL — ABNORMAL LOW (ref 6.5–8.1)

## 2022-09-26 LAB — PROCALCITONIN: Procalcitonin: 0.48 ng/mL

## 2022-09-26 LAB — HEMOGLOBIN AND HEMATOCRIT, BLOOD
HCT: 27.2 % — ABNORMAL LOW (ref 39.0–52.0)
Hemoglobin: 8.4 g/dL — ABNORMAL LOW (ref 13.0–17.0)

## 2022-09-26 LAB — MRSA NEXT GEN BY PCR, NASAL: MRSA by PCR Next Gen: NOT DETECTED

## 2022-09-26 LAB — STREP PNEUMONIAE URINARY ANTIGEN: Strep Pneumo Urinary Antigen: NEGATIVE

## 2022-09-26 LAB — CULTURE, BLOOD (ROUTINE X 2): Culture: NO GROWTH

## 2022-09-26 MED ORDER — AZITHROMYCIN 500 MG PO TABS
500.0000 mg | ORAL_TABLET | Freq: Every day | ORAL | Status: AC
  Administered 2022-09-26 – 2022-09-29 (×4): 500 mg via ORAL
  Filled 2022-09-26 (×4): qty 1

## 2022-09-26 MED ORDER — CHLORHEXIDINE GLUCONATE CLOTH 2 % EX PADS
6.0000 | MEDICATED_PAD | Freq: Every day | CUTANEOUS | Status: DC
  Administered 2022-09-26 – 2022-09-29 (×4): 6 via TOPICAL

## 2022-09-26 MED ORDER — ALUM & MAG HYDROXIDE-SIMETH 200-200-20 MG/5ML PO SUSP
15.0000 mL | Freq: Four times a day (QID) | ORAL | Status: DC | PRN
  Administered 2022-09-26 – 2022-09-28 (×2): 15 mL via ORAL
  Filled 2022-09-26 (×2): qty 30

## 2022-09-26 MED ORDER — SALINE SPRAY 0.65 % NA SOLN
1.0000 | NASAL | Status: DC | PRN
  Administered 2022-09-26: 1 via NASAL
  Filled 2022-09-26: qty 44

## 2022-09-26 NOTE — Progress Notes (Signed)
  Progress Note   Patient: Frank [name] NWG:[id number] DOB: [birth year] DOA: 09/25/2022     1 DOS: the patient was seen and examined on 09/26/2022   Brief hospital course: 67 y.o. 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 Berneice Heinrich with pathology report showing prostatic adenocarcinoma.  Postprocedure, he had to stay in the hospital for an additional 5 days due to decompensated respiratory status requiring beta agonist nebs, glucocorticoids and aggressive pulmonary toilet.  He returns today due to hematuria, but was noticed to be persistently tachycardic and tachypneic in the emergency department.  He stated just over the weekend and his urine was of a lemonade color and nonbloody.  No flank pain, dysuria or frequency.  He denied fever, chills, rhinorrhea, sore throat or hemoptysis.  No chest pain, palpitations, diaphoresis, PND, orthopnea or recent pitting edema of the lower extremities.  No abdominal pain, nausea, emesis, diarrhea, constipation, melena or hematochezia. No polyuria, polydipsia, polyphagia or blurred vision.   Assessment and Plan: Principal Problem:   Acute blood loss anemia (ABLA) Due to:   Hematuria In the setting of to:   Prostate cancer metastatic to bone (HCC)  -Pt is continued on CBI at time of presentation -Urine remains bloody -Follow hgb trends and transfuse as needed -Continued on ceftriaxone for pneumonia. -Continue Zytiga 1000 mg p.o. daily. -Urology is following for CBI   Active Problems:   Acute on chronic respiratory failure with hypoxia and hypercapnia (HCC) In the setting of:   COPD (chronic obstructive pulmonary disease) (HCC) With  superimposed:   Multifocal pneumonia  Continue supplemental oxygen. Scheduled and as needed bronchodilators. -cont daily prednisone Continue ceftriaxone and azithro Urine strep pneumo neg -WBC 14.7 this AM -Follow CBC trends      Subjective: Reports breathing better today. Still having bloody appearing urine. Denies abd pains  Physical Exam: Vitals:   09/26/22 0515 09/26/22 0722 09/26/22 1206 09/26/22 1311  BP: 112/63   116/63  Pulse: 95   99  Resp: 18   (!) 22  Temp: 98.5 F (36.9 C)   98.4 F (36.9 C)  TempSrc: Oral   Oral  SpO2: 91% 93% 90% 90%  Weight:      Height:       General exam: Awake, laying in bed, in nad Respiratory system: Normal respiratory effort, no wheezing Cardiovascular system: regular rate, s1, s2 Gastrointestinal system: Soft, nondistended, positive BS Central nervous system: CN2-12 grossly intact, strength intact Extremities: Perfused, no clubbing Skin: Normal skin turgor, no notable skin lesions seen Psychiatry: Mood normal // no visual hallucinations   Data Reviewed:  Labs reviewed:Na 136, K 3.7, Cr 0.66, Hgb 8.4   Family Communication: Pt in room, family not at bedside  Disposition: Status is: Inpatient Remains inpatient appropriate because: Severity of illness  Planned Discharge Destination: Home    Author: Rickey Barbara, MD 09/26/2022 2:34 PM  For on call review [email or web address].

## 2022-09-26 NOTE — Progress Notes (Addendum)
Catheter hand irrigated multiple times with NS.  700 output returned with multiple large and small clots.  Uriology PA notified and en route to bedside

## 2022-09-26 NOTE — Evaluation (Signed)
Physical Therapy Evaluation Patient Details Name: Frank [name] MRN: [id number] DOB: 1955-11-04 Today's Date: 09/26/2022  History of Present Illness  67 y.o. male adm with ABLA, hematuria. PMH: osteoarthritis, COPD/emphysema with chronic respiratory failure with hypoxia on home oxygen at 5 LPM via [place], nephrolithiasis, pneumonia, tobacco abuse, COVID-19, diverticulosis, colon polyps, prostate cancer metastatic to bone, urinary retention with indwelling Foley catheter, TURP on 09/08/2022 with Dr. Berneice Heinrich- post op complication of decompensated respiratory status, stay prolonged ~5d;  Clinical Impression  Pt admitted with above diagnosis.  Pt agreeable to light activity-transferred to chair with min/guard -supervision; dyspneic at rest, demonstrates independence with pursed lip breathing, cues for proper posture, shoulder depression;  pt sats decr to 86% with EOB, 87% after transfer, incr to 90% after 3 minute rest--on 6L HFNC throughout (had breathing tx just prior to PT session) Pt will likely benefit from resuming prior therapy in home setting. Continue PT in acute setting.    Pt currently with functional limitations due to the deficits listed below (see PT Problem List). Pt will benefit from acute skilled PT to increase their independence and safety with mobility to allow discharge.          Recommendations for follow up therapy are one component of a multi-disciplinary discharge planning process, led by the attending physician.  Recommendations may be updated based on patient status, additional functional criteria and insurance authorization.  Follow Up Recommendations       Assistance Recommended at Discharge Intermittent Supervision/Assistance  Patient can return home with the following  A little help with bathing/dressing/bathroom;Assistance with cooking/housework;Assist for transportation;Help with stairs or ramp for entrance    Equipment Recommendations None recommended by PT   Recommendations for Other Services       Functional Status Assessment Patient has had a recent decline in their functional status and demonstrates the ability to make significant improvements in function in a reasonable and predictable amount of time.     Precautions / Restrictions Precautions Precautions: Fall Precaution Comments: monitor O2 Restrictions Weight Bearing Restrictions: No      Mobility  Bed Mobility Overal bed mobility: Needs Assistance Bed Mobility: Supine to Sit     Supine to sit: Supervision     General bed mobility comments: for safety, lines    Transfers Overall transfer level: Needs assistance Equipment used: Rolling walker (2 wheels) Transfers: Sit to/from Stand, Bed to chair/wheelchair/BSC Sit to Stand: Min guard   Step pivot transfers: Min guard       General transfer comment: for safety and line management; pt sats decr to 86% with EOB, 87% after transfer, incr to 90% after 3 minute rest--on 6L HFNC throughout (had breathing tx just prior to PT session)    Ambulation/Gait               General Gait Details: deferred d/t DOE  Careers information officer    Modified Rankin (Stroke Patients Only)       Balance Overall balance assessment: Needs assistance Sitting-balance support: No upper extremity supported, Feet supported Sitting balance-Leahy Scale: Good       Standing balance-Leahy Scale: Fair Standing balance comment: able to perform stand pivot transfer without UE support                             Pertinent Vitals/Pain Pain Assessment Pain Assessment: No/denies pain    Home Living Family/patient  expects to be discharged to:: Private residence Living Arrangements: Alone Available Help at Discharge: Family;Available PRN/intermittently Type of Home: House Home Access: Level entry       Home Layout: Two level;Able to live on main level with bedroom/bathroom Home Equipment: Cane -  single point;Rolling Walker (2 wheels);Shower seat Additional Comments: uses 5L/min O2 at home    Prior Function Prior Level of Function : Independent/Modified Independent             Mobility Comments: working with HHPT prior to current admission, activity ltd d/t DOE/SOB, overall amb with RW household distances       Hand Dominance        Extremity/Trunk Assessment   Upper Extremity Assessment Upper Extremity Assessment: Overall WFL for tasks assessed    Lower Extremity Assessment Lower Extremity Assessment: Overall WFL for tasks assessed       Communication   Communication: Other (comment) (ltd by dyspnea)  Cognition Arousal/Alertness: Awake/alert Behavior During Therapy: WFL for tasks assessed/performed, Flat affect Overall Cognitive Status: Within Functional Limits for tasks assessed                                          General Comments      Exercises General Exercises - Lower Extremity Ankle Circles/Pumps: AROM, Both, 10 reps Long Arc Quad: AROM, Both, 10 reps   Assessment/Plan    PT Assessment Patient needs continued PT services  PT Problem List Cardiopulmonary status limiting activity;Decreased mobility;Decreased activity tolerance;Decreased balance       PT Treatment Interventions DME instruction;Therapeutic exercise;Gait training;Functional mobility training;Therapeutic activities;Patient/family education    PT Goals (Current goals can be found in the Care Plan section)  Acute Rehab PT Goals Patient Stated Goal: none stated PT Goal Formulation: With patient Time For Goal Achievement: 10/10/22 Potential to Achieve Goals: Good    Frequency Min 3X/week     Co-evaluation               AM-PAC PT "6 Clicks" Mobility  Outcome Measure Help needed turning from your back to your side while in a flat bed without using bedrails?: A Little Help needed moving from lying on your back to sitting on the side of a flat bed  without using bedrails?: A Little Help needed moving to and from a bed to a chair (including a wheelchair)?: A Little Help needed standing up from a chair using your arms (e.g., wheelchair or bedside chair)?: A Little Help needed to walk in hospital room?: A Little Help needed climbing 3-5 steps with a railing? : A Lot 6 Click Score: 17    End of Session Equipment Utilized During Treatment: Gait belt;Oxygen Activity Tolerance: Patient limited by fatigue;Patient tolerated treatment well Patient left: in chair;with call bell/phone within reach;with chair alarm set;with family/visitor present Nurse Communication: Mobility status PT Visit Diagnosis: Other abnormalities of gait and mobility (R26.89);Difficulty in walking, not elsewhere classified (R26.2)    Time: 1538-1600 PT Time Calculation (min) (ACUTE ONLY): 22 min   Charges:   PT Evaluation $PT Eval Low Complexity: 1 Low          Zlaty Alexa, PT  Acute Rehab Dept ([facility]) [phone]  09/26/2022   [facility] 09/26/2022, 4:08 PM

## 2022-09-26 NOTE — Progress Notes (Signed)
Echocardiogram 2D Echocardiogram has been performed.  Blair Hailey 09/26/2022, 9:11 AM

## 2022-09-26 NOTE — Progress Notes (Signed)
Urology Consult  Referring physician: Dr. Rhona Leavens Reason for referral: hematuria, CBI  Chief Complaint: Hematuria  History of Present Illness: Frank [name] is a 67 year old male known to our practice.  He was recently discharged on 09/13/2022 from [facility] following a transurethral prostate resection with Dr. Berneice Heinrich, and a protracted stay due to his significantly diminished lung function.  Patient reports that he had not had any difficulty with hematuria up until yesterday morning.  Unclear if Foley was pulled traumatically and unable to determine if an infectious source was present.  On my arrival patient was alert, oriented, and in no distress.  He was not uncomfortable and amenable to the plan listed below.  Past Medical History:  Diagnosis Date   Arthritis    Cancer (HCC)    COPD (chronic obstructive pulmonary disease) (HCC) 06/05/2012   COVID-19 02/28/2020   Diverticulosis of colon 06/09/2016   Dyspnea    Emphysema 06/07/2012   Heart murmur    hx of as baby   History of colonic polyps 06/17/2014   History of kidney stones    Hypoxemia 06/07/2012   Obstructive chronic bronchitis with exacerbation (HCC) 06/07/2012   Pneumonia    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   Tobacco abuse 06/05/2012   Past Surgical History:  Procedure Laterality Date   CATARACT EXTRACTION, BILATERAL     CYSTOSCOPY WITH LITHOLAPAXY N/A 01/13/2022   Procedure: CYSTOSCOPY WITH LITHOLAPAXY;  Surgeon: Sebastian Ache, MD;  Location: [facility];  Service: Urology;  Laterality: N/A;   CYSTOSCOPY/RETROGRADE/URETEROSCOPY/STONE EXTRACTION WITH BASKET Right 10/04/2016   Procedure: CYSTOSCOPY/RETROGRADE/URETEROSCOPY/STONE EXTRACTION WITH BASKET/ STENT;  Surgeon: Sebastian Ache, MD;  Location: [facility];  Service: Urology;  Laterality: Right;  With LASER   CYSTOSCOPY/URETEROSCOPY/HOLMIUM LASER/STENT PLACEMENT Right 01/13/2022   Procedure: CYSTOSCOPY/URETEROSCOPY/HOLMIUM LASER/STENT PLACEMENT;  Surgeon:  Sebastian Ache, MD;  Location: [facility];  Service: Urology;  Laterality: Right;   EXTRACORPOREAL SHOCK WAVE LITHOTRIPSY Right 06/29/2016   Procedure: RIGHT EXTRACORPOREAL SHOCK WAVE LITHOTRIPSY (ESWL);  Surgeon: Crist Fat, MD;  Location: [facility];  Service: Urology;  Laterality: Right;   TRANSURETHRAL RESECTION OF PROSTATE N/A 09/08/2022   Procedure: TRANSURETHRAL RESECTION OF THE PROSTATE (TURP);  Surgeon: Loletta Parish., MD;  Location: [facility];  Service: Urology;  Laterality: N/A;  75 MINS    Medications: I have reviewed the patient's current medications. Allergies: No Known Allergies  Family History  Problem Relation Age of Onset   COPD Mother    Colon polyps Father    Heart attack Father 33   Colon polyps Sister    Social History:  reports that he quit smoking about 7 years ago. His smoking use included cigarettes. He has a 17.50 pack-year smoking history. He quit smokeless tobacco use about 54 years ago.  His smokeless tobacco use included chew. He reports that he does not drink alcohol and does not use drugs.  ROS: All systems are reviewed and negative except as noted. Hematuria  Physical Exam:  Vital signs in last 24 hours: Temp:  [98.5 F (36.9 C)-99.5 F (37.5 C)] 98.5 F (36.9 C) (05/21 0515) Pulse Rate:  [95-123] 95 (05/21 0515) Resp:  [18-29] 18 (05/21 0515) BP: (108-127)/(56-90) 112/63 (05/21 0515) SpO2:  [88 %-96 %] 93 % (05/21 0722) FiO2 (%):  [36 %-40 %] 40 % (05/21 0722) Weight:  [68.1 kg] 68.1 kg (05/20 1813)  Cardiovascular: Skin warm; not flushed Respiratory: Breaths quiet; no shortness of breath Abdomen: No masses, soft, no guarding or rebound  Neurological: Normal sensation to touch Musculoskeletal: Normal motor function arms and legs Skin: No rashes Genitourinary: Three-way 20 F hematuria catheter in place draining pink lemonade colored urine on slow/medium gtt.  Laboratory Data:  Results for orders placed or performed during the hospital  encounter of 09/25/22 (from the past 72 hour(s))  Comprehensive metabolic panel     Status: Abnormal   Collection Time: 09/25/22 10:29 AM  Result Value Ref Range   Sodium 137 135 - 145 mmol/L   Potassium 3.8 3.5 - 5.1 mmol/L   Chloride 91 (L) 98 - 111 mmol/L   CO2 37 (H) 22 - 32 mmol/L   Glucose, Bld 149 (H) 70 - 99 mg/dL    Comment: Glucose reference range applies only to samples taken after fasting for at least 8 hours.   BUN 26 (H) 8 - 23 mg/dL   Creatinine, Ser 8.29 0.61 - 1.24 mg/dL   Calcium 9.1 8.9 - 56.2 mg/dL   Total Protein 6.9 6.5 - 8.1 g/dL   Albumin 3.2 (L) 3.5 - 5.0 g/dL   AST 17 15 - 41 U/L   ALT 15 0 - 44 U/L   Alkaline Phosphatase 78 38 - 126 U/L   Total Bilirubin 0.6 0.3 - 1.2 mg/dL   GFR, Estimated >13 >08 mL/min    Comment: (NOTE) Calculated using the CKD-EPI Creatinine Equation (2021)    Anion gap 9 5 - 15    Comment: Performed at [facility], [address], [place], [place] [address]  Lipase, blood     Status: None   Collection Time: 09/25/22 10:29 AM  Result Value Ref Range   Lipase 35 11 - 51 U/L    Comment: Performed at [facility], [address] [place], [place] [address]  CBC with Diff     Status: Abnormal   Collection Time: 09/25/22 10:29 AM  Result Value Ref Range   WBC 19.7 (H) 4.0 - 10.5 K/uL   RBC 3.91 (L) 4.22 - 5.81 MIL/uL   Hemoglobin 11.2 (L) 13.0 - 17.0 g/dL   HCT 52.8 (L) 41.3 - 24.4 %   MCV 94.9 80.0 - 100.0 fL   MCH 28.6 26.0 - 34.0 pg   MCHC 30.2 30.0 - 36.0 g/dL   RDW 01.0 27.2 - 53.6 %   Platelets 242 150 - 400 K/uL   nRBC 0.0 0.0 - 0.2 %   Neutrophils Relative % 79 %   Neutro Abs 15.8 (H) 1.7 - 7.7 K/uL   Lymphocytes Relative 8 %   Lymphs Abs 1.5 0.7 - 4.0 K/uL   Monocytes Relative 9 %   Monocytes Absolute 1.8 (H) 0.1 - 1.0 K/uL   Eosinophils Relative 1 %   Eosinophils Absolute 0.2 0.0 - 0.5 K/uL   Basophils Relative 1 %   Basophils Absolute 0.2 (H) 0.0 - 0.1 K/uL   Immature  Granulocytes 2 %   Abs Immature Granulocytes 0.31 (H) 0.00 - 0.07 K/uL    Comment: Performed at [facility], [address] [place], [place] [address]  Urinalysis, Routine w reflex microscopic -Urine, Catheterized; Indwelling urinary catheter     Status: Abnormal   Collection Time: 09/25/22 10:29 AM  Result Value Ref Range   Color, Urine RED (A) YELLOW    Comment: BIOCHEMICALS MAY BE AFFECTED BY COLOR   APPearance TURBID (A) CLEAR   Specific Gravity, Urine  1.005 - 1.030    TEST NOT REPORTED DUE TO COLOR INTERFERENCE OF URINE  PIGMENT   pH  5.0 - 8.0    TEST NOT REPORTED DUE TO COLOR INTERFERENCE OF URINE PIGMENT   Glucose, UA (A) NEGATIVE mg/dL    TEST NOT REPORTED DUE TO COLOR INTERFERENCE OF URINE PIGMENT   Hgb urine dipstick (A) NEGATIVE    TEST NOT REPORTED DUE TO COLOR INTERFERENCE OF URINE PIGMENT   Bilirubin Urine (A) NEGATIVE    TEST NOT REPORTED DUE TO COLOR INTERFERENCE OF URINE PIGMENT   Ketones, ur (A) NEGATIVE mg/dL    TEST NOT REPORTED DUE TO COLOR INTERFERENCE OF URINE PIGMENT   Protein, ur (A) NEGATIVE mg/dL    TEST NOT REPORTED DUE TO COLOR INTERFERENCE OF URINE PIGMENT   Nitrite (A) NEGATIVE    TEST NOT REPORTED DUE TO COLOR INTERFERENCE OF URINE PIGMENT   Leukocytes,Ua (A) NEGATIVE    TEST NOT REPORTED DUE TO COLOR INTERFERENCE OF URINE PIGMENT   RBC / HPF >50 0 - 5 RBC/hpf   WBC, UA 0-5 0 - 5 WBC/hpf   Bacteria, UA NONE SEEN NONE SEEN   Squamous Epithelial / HPF 0-5 0 - 5 /HPF   WBC Clumps PRESENT     Comment: Performed at [facility], [address] [place], [place] [address]  Troponin I (High Sensitivity)     Status: None   Collection Time: 09/25/22 10:29 AM  Result Value Ref Range   Troponin I (High Sensitivity) 10 <18 ng/L    Comment: (NOTE) Elevated high sensitivity troponin I (hsTnI) values and significant  changes across serial measurements may suggest ACS but many other  chronic and acute conditions are  known to elevate hsTnI results.  Refer to the "Links" section for chest pain algorithms and additional  guidance. Performed at [facility], [address], [place], [place] [address]   D-dimer, quantitative     Status: Abnormal   Collection Time: 09/25/22 10:29 AM  Result Value Ref Range   D-Dimer, Quant 1.59 (H) 0.00 - 0.50 ug/mL-FEU    Comment: (NOTE) At the manufacturer cut-off value of 0.5 g/mL FEU, this assay has a negative predictive value of 95-100%.This assay is intended for use in conjunction with a clinical pretest probability (PTP) assessment model to exclude pulmonary embolism (PE) and deep venous thrombosis (DVT) in outpatients suspected of PE or DVT. Results should be correlated with clinical presentation. Performed at [facility], [address], [place], [place] [address]   Strep pneumoniae urinary antigen     Status: None   Collection Time: 09/25/22 10:29 AM  Result Value Ref Range   Strep Pneumo Urinary Antigen NEGATIVE NEGATIVE    Comment:        Infection due to S. pneumoniae cannot be absolutely ruled out since the antigen present may be below the detection limit of the test. Performed at [facility], [address], [place], [place] [address]   Troponin I (High Sensitivity)     Status: None   Collection Time: 09/25/22  1:22 PM  Result Value Ref Range   Troponin I (High Sensitivity) 9 <18 ng/L    Comment: (NOTE) Elevated high sensitivity troponin I (hsTnI) values and significant  changes across serial measurements may suggest ACS but many other  chronic and acute conditions are known to elevate hsTnI results.  Refer to the "Links" section for chest pain algorithms and additional  guidance. Performed at [facility], [address] [place], [place] [address]   Procalcitonin  Status: None   Collection Time: 09/25/22  1:22 PM  Result Value Ref Range   Procalcitonin 0.26 ng/mL     Comment:        Interpretation: PCT (Procalcitonin) <= 0.5 ng/mL: Systemic infection (sepsis) is not likely. Local bacterial infection is possible. (NOTE)       Sepsis PCT Algorithm           Lower Respiratory Tract                                      Infection PCT Algorithm    ----------------------------     ----------------------------         PCT < 0.25 ng/mL                PCT < 0.10 ng/mL          Strongly encourage             Strongly discourage   discontinuation of antibiotics    initiation of antibiotics    ----------------------------     -----------------------------       PCT 0.25 - 0.50 ng/mL            PCT 0.10 - 0.25 ng/mL               OR       >80% decrease in PCT            Discourage initiation of                                            antibiotics      Encourage discontinuation           of antibiotics    ----------------------------     -----------------------------         PCT >= 0.50 ng/mL              PCT 0.26 - 0.50 ng/mL               AND        <80% decrease in PCT             Encourage initiation of                                             antibiotics       Encourage continuation           of antibiotics    ----------------------------     -----------------------------        PCT >= 0.50 ng/mL                  PCT > 0.50 ng/mL               AND         increase in PCT                  Strongly encourage                                      initiation of antibiotics  Strongly encourage escalation           of antibiotics                                     -----------------------------                                           PCT <= 0.25 ng/mL                                                 OR                                        > 80% decrease in PCT                                      Discontinue / Do not initiate                                             antibiotics  Performed at [facility], [address] [place], [place] [address]   Blood culture (routine x 2)     Status: None (Preliminary result)   Collection Time: 09/25/22  2:55 PM   Specimen: BLOOD RIGHT FOREARM  Result Value Ref Range   Specimen Description      BLOOD RIGHT FOREARM Performed at [facility], [address] [place], [place] [address]    Special Requests      BOTTLES DRAWN AEROBIC AND ANAEROBIC Blood Culture results may not be optimal due to an excessive volume of blood received in culture bottles Performed at [facility], [address] [place], [place] [address]    Culture      NO GROWTH < 24 HOURS Performed at [facility], [address], [place], [place] [address]    Report Status PENDING   Blood culture (routine x 2)     Status: None (Preliminary result)   Collection Time: 09/25/22  3:07 PM   Specimen: BLOOD RIGHT HAND  Result Value Ref Range   Specimen Description      BLOOD RIGHT HAND Performed at [facility], [address] [place], [place] [address]    Special Requests      BOTTLES DRAWN AEROBIC AND ANAEROBIC Blood Culture results may not be optimal due to an excessive volume of blood received in culture bottles Performed at [facility], [address], [place], [place] [address]    Culture      NO GROWTH < 24 HOURS Performed at [facility], [address] [place], [place] [address]    Report Status PENDING   Lactic acid, plasma     Status: None   Collection Time: 09/25/22  5:10 PM  Result Value Ref Range   Lactic Acid, Venous 1.0 0.5 -  1.9 mmol/L    Comment: Performed at [facility], [address], [place], [place] [address]  Type and screen [facility]     Status: None   Collection Time: 09/25/22  6:58 PM  Result Value Ref Range   ABO/RH(D) O POS    Antibody Screen NEG    Sample Expiration      09/28/2022,2359 Performed at [facility],  [address], [place], [place] [address]   Hemoglobin and hematocrit, blood     Status: Abnormal   Collection Time: 09/25/22  7:11 PM  Result Value Ref Range   Hemoglobin 9.4 (L) 13.0 - 17.0 g/dL   HCT 38.7 (L) 56.4 - 33.2 %    Comment: Performed at [facility], [address] [place], [place] [address]  ABO/Rh     Status: None   Collection Time: 09/25/22  8:20 PM  Result Value Ref Range   ABO/RH(D)      O POS Performed at [facility], [address] [place], [place] [address]   MRSA Next Gen by PCR, Nasal     Status: None   Collection Time: 09/25/22 10:41 PM   Specimen: Nasal Mucosa; Nasal Swab  Result Value Ref Range   MRSA by PCR Next Gen NOT DETECTED NOT DETECTED    Comment: (NOTE) The GeneXpert MRSA Assay (FDA approved for NASAL specimens only), is one component of a comprehensive MRSA colonization surveillance program. It is not intended to diagnose MRSA infection nor to guide or monitor treatment for MRSA infections. Test performance is not FDA approved in patients less than 40 years old. Performed at [facility], [address], [place], [place] [address]   CBC     Status: Abnormal   Collection Time: 09/26/22  5:25 AM  Result Value Ref Range   WBC 14.7 (H) 4.0 - 10.5 K/uL   RBC 2.78 (L) 4.22 - 5.81 MIL/uL   Hemoglobin 8.0 (L) 13.0 - 17.0 g/dL   HCT 01.0 (L) 93.2 - 35.5 %   MCV 95.0 80.0 - 100.0 fL   MCH 28.8 26.0 - 34.0 pg   MCHC 30.3 30.0 - 36.0 g/dL   RDW 73.2 20.2 - 54.2 %   Platelets 174 150 - 400 K/uL   nRBC 0.0 0.0 - 0.2 %    Comment: Performed at [facility], [address], [place], [place] [address]  Comprehensive metabolic panel     Status: Abnormal   Collection Time: 09/26/22  5:25 AM  Result Value Ref Range   Sodium 136 135 - 145 mmol/L   Potassium 3.7 3.5 - 5.1 mmol/L   Chloride 95 (L) 98 - 111 mmol/L   CO2 36 (H) 22 - 32 mmol/L   Glucose, Bld 99 70 - 99 mg/dL     Comment: Glucose reference range applies only to samples taken after fasting for at least 8 hours.   BUN 20 8 - 23 mg/dL   Creatinine, Ser 7.62 0.61 - 1.24 mg/dL   Calcium 8.1 (L) 8.9 - 10.3 mg/dL   Total Protein 5.3 (L) 6.5 - 8.1 g/dL   Albumin 2.4 (L) 3.5 - 5.0 g/dL   AST 14 (L) 15 - 41 U/L   ALT 12 0 - 44 U/L   Alkaline Phosphatase 56 38 - 126 U/L   Total Bilirubin 0.9 0.3 - 1.2 mg/dL   GFR, Estimated >83 >15 mL/min    Comment: (NOTE) Calculated  using the CKD-EPI Creatinine Equation (2021)    Anion gap 5 5 - 15    Comment: Performed at [facility], [address], [place], [place] [address]  Procalcitonin     Status: None   Collection Time: 09/26/22  5:25 AM  Result Value Ref Range   Procalcitonin 0.48 ng/mL    Comment:        Interpretation: PCT (Procalcitonin) <= 0.5 ng/mL: Systemic infection (sepsis) is not likely. Local bacterial infection is possible. (NOTE)       Sepsis PCT Algorithm           Lower Respiratory Tract                                      Infection PCT Algorithm    ----------------------------     ----------------------------         PCT < 0.25 ng/mL                PCT < 0.10 ng/mL          Strongly encourage             Strongly discourage   discontinuation of antibiotics    initiation of antibiotics    ----------------------------     -----------------------------       PCT 0.25 - 0.50 ng/mL            PCT 0.10 - 0.25 ng/mL               OR       >80% decrease in PCT            Discourage initiation of                                            antibiotics      Encourage discontinuation           of antibiotics    ----------------------------     -----------------------------         PCT >= 0.50 ng/mL              PCT 0.26 - 0.50 ng/mL               AND        <80% decrease in PCT             Encourage initiation of                                             antibiotics       Encourage continuation           of  antibiotics    ----------------------------     -----------------------------        PCT >= 0.50 ng/mL                  PCT > 0.50 ng/mL               AND         increase in PCT                  Strongly encourage  initiation of antibiotics    Strongly encourage escalation           of antibiotics                                     -----------------------------                                           PCT <= 0.25 ng/mL                                                 OR                                        > 80% decrease in PCT                                      Discontinue / Do not initiate                                             antibiotics  Performed at [facility], [address] [place], [place] [address]    Recent Results (from the past 240 hour(s))  Blood culture (routine x 2)     Status: None (Preliminary result)   Collection Time: 09/25/22  2:55 PM   Specimen: BLOOD RIGHT FOREARM  Result Value Ref Range Status   Specimen Description   Final    BLOOD RIGHT FOREARM Performed at [facility], [address], [place], [place] [address]    Special Requests   Final    BOTTLES DRAWN AEROBIC AND ANAEROBIC Blood Culture results may not be optimal due to an excessive volume of blood received in culture bottles Performed at [facility], [address], [place], [place] [address]    Culture   Final    NO GROWTH < 24 HOURS Performed at [facility], [address] [place], [place] [address]    Report Status PENDING  Incomplete  Blood culture (routine x 2)     Status: None (Preliminary result)   Collection Time: 09/25/22  3:07 PM   Specimen: BLOOD RIGHT HAND  Result Value Ref Range Status   Specimen Description   Final    BLOOD RIGHT HAND Performed at [facility], [address] [place], [place] [address]    Special Requests   Final    BOTTLES  DRAWN AEROBIC AND ANAEROBIC Blood Culture results may not be optimal due to an excessive volume of blood received in culture bottles Performed at [facility], [address] [place], [place] [address]    Culture   Final    NO GROWTH < 24 HOURS Performed at [facility], [address], [place], [place] [address]    Report Status PENDING  Incomplete  MRSA Next Gen by  PCR, Nasal     Status: None   Collection Time: 09/25/22 10:41 PM   Specimen: Nasal Mucosa; Nasal Swab  Result Value Ref Range Status   MRSA by PCR Next Gen NOT DETECTED NOT DETECTED Final    Comment: (NOTE) The GeneXpert MRSA Assay (FDA approved for NASAL specimens only), is one component of a comprehensive MRSA colonization surveillance program. It is not intended to diagnose MRSA infection nor to guide or monitor treatment for MRSA infections. Test performance is not FDA approved in patients less than 72 years old. Performed at [facility], [address] [place], [place] [address]    Creatinine: Recent Labs    09/25/22 1029 09/26/22 0525  CREATININE 0.69 0.66    Imaging: CT A/P shows likely blood products and bladder  Impression/Assessment:  Patient had hematuria catheter placed with CBI initiation prior to my seeing him.  There was no clot accumulation in tubing and fluid was medium pink on low drip rate.  Raised slightly today on rounds.  Plan:  Daily CBC Renal function at baseline, WNL Urinalysis with color interference on almost all metrics.  Would recommend repeat urinalysis and culture after a couple of days of CBI to tease out hemorrhagic cystitis, but patient has been on broad-spectrum antibiotics and this would be low yield at this point. Will continue CBI although the short-term and we will discuss possible cystoscopy with fulguration with Dr. Berneice Heinrich if he fails to improve or acutely worsens in the coming days. Continue Melanie Crazier  Odella Appelhans 09/26/2022, 10:22 AM  Pager: [phone]

## 2022-09-26 NOTE — TOC Initial Note (Signed)
Transition of Care [facility]) - Initial/Assessment Note    Patient Details  Name: Frank [name] MRN: [id number] Date of Birth: 1955/12/09  Transition of Care [facility]) CM/SW Contact:    Lanier Clam, RN Phone Number: 09/26/2022, 10:59 AM  Clinical Narrative: Active w/[facility] rep [place]-HHRN/PT/OT;Active [facility] home 02-has travel tank. Family has own transport home. PT cons.                Expected Discharge Plan: Home w Home Health Services Barriers to Discharge: Continued Medical Work up   Patient Goals and CMS Choice Patient states their goals for this hospitalization and ongoing recovery are:: Home CMS Medicare.gov Compare Post Acute Care list provided to:: Patient Represenative (must comment) Choice offered to / list presented to : Sibling [place] ownership interest in [facility].provided to:: Patient    Expected Discharge Plan and Services   Discharge Planning Services: CM Consult Post Acute Care Choice: Durable Medical Equipment, Home Health (Active [facility]-HHRN/PT/OT;[facility]-home 02 has travel tank)                             HH Arranged: RN, PT, OT HH Agency: [facility] Date [facility] Agency Contacted: 09/26/22 Time HH Agency Contacted: [phone] Representative spoke with at [facility] Agency: Kandee Keen  Prior Living Arrangements/Services   Lives with:: Self Patient language and need for interpreter reviewed:: Yes Do you feel safe going back to the place where you live?: Yes      Need for Family Participation in Patient Care: Yes (Comment) Care giver support system in place?: Yes (comment) Current home services: DME, Home PT, Home RN, Home OT (Active [facility] HHRN/PT/OT;Active [facility]-home 02) Criminal Activity/Legal Involvement Pertinent to Current Situation/Hospitalization: No - Comment as needed  Activities of Daily Living Home Assistive Devices/Equipment: Walker (specify type) ADL Screening (condition at time of admission) Patient's cognitive  ability adequate to safely complete daily activities?: Yes Is the patient deaf or have difficulty hearing?: No Does the patient have difficulty seeing, even when wearing glasses/contacts?: No Does the patient have difficulty concentrating, remembering, or making decisions?: No Patient able to express need for assistance with ADLs?: Yes Does the patient have difficulty dressing or bathing?: No Independently performs ADLs?: Yes (appropriate for developmental age) Does the patient have difficulty walking or climbing stairs?: No Weakness of Legs: None Weakness of Arms/Hands: None  Permission Sought/Granted Permission sought to share information with : Case Manager Permission granted to share information with : Yes, Verbal Permission Granted  Share Information with NAME: Case manager           Emotional Assessment Appearance:: Appears stated age Attitude/Demeanor/Rapport: Gracious   Orientation: : Oriented to Self, Oriented to Place, Oriented to  Time, Oriented to Situation Alcohol / Substance Use: Not Applicable Psych Involvement: No (comment)  Admission diagnosis:  Gross hematuria [R31.0] Hematuria [R31.9] Complication of Foley catheter, initial encounter (HCC) [J19.9XXA] Pneumonia due to infectious organism, unspecified laterality, unspecified part of lung [J18.9] Patient Active Problem List   Diagnosis Date Noted   Hematuria 09/25/2022   Acute blood loss anemia (ABLA) 09/25/2022   Multifocal pneumonia 09/25/2022   Urinary retention 09/08/2022   Acute on chronic respiratory failure with hypoxia and hypercapnia (HCC) 08/05/2022   Pressure injury of skin 08/05/2022   COPD exacerbation (HCC) 08/04/2022   Insomnia 11/15/2020   COVID-19 02/28/2020   Physical deconditioning 08/29/2019   Decreased appetite 07/08/2019   Healthcare maintenance 07/08/2019   Allergic rhinitis 05/30/2017  COPD (chronic obstructive pulmonary disease) (HCC) 11/27/2016   Advance care planning  10/23/2016   Diverticulosis of colon 06/09/2016   History of colonic polyps 06/17/2014   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   Chronic respiratory failure with hypoxia, on home O2 therapy (HCC) 06/05/2012   PCP:  Crissie Sickles, MD Pharmacy:   [id number] - [place], [place] - [address] AT [facility] OF [address] [place] [address] Phone: [phone] Fax: [phone]     Social Determinants of Health (SDOH) Social History: SDOH Screenings   Food Insecurity: No Food Insecurity (09/25/2022)  Housing: Low Risk  (09/25/2022)  Transportation Needs: No Transportation Needs (09/25/2022)  Recent Concern: Transportation Needs - Unmet Transportation Needs (08/08/2022)  Utilities: Not At Risk (09/25/2022)  Alcohol Screen: Low Risk  (07/17/2022)  Depression (PHQ2-9): Low Risk  (07/17/2022)  Financial Resource Strain: Low Risk  (09/14/2022)  Physical Activity: Inactive (07/17/2022)  Social Connections: Socially Isolated (07/17/2022)  Stress: No Stress Concern Present (07/17/2022)  Tobacco Use: Medium Risk (09/25/2022)   SDOH Interventions:     Readmission Risk Interventions    09/26/2022   10:49 AM 09/11/2022   12:32 PM 08/08/2022   10:30 AM  Readmission Risk Prevention Plan  Transportation Screening Complete Complete Complete  PCP or Specialist Appt within 5-7 Days   Complete  PCP or Specialist Appt within 3-5 Days Complete Complete   Home Care Screening   Complete  Medication Review (RN CM)   Complete  HRI or Home Care Consult Complete Complete   Social Work Consult for Recovery Care Planning/Counseling Complete Complete   Palliative Care Screening Not Applicable Not Applicable   Medication Review [employer])  Complete

## 2022-09-26 NOTE — Hospital Course (Addendum)
Frank [name] is a 67 y.o. male with medical history significant of osteoarthritis, COPD/emphysema with chronic respiratory failure with hypoxia on home oxygen at 5 LPM via [place], nephrolithiasis, history of pneumonia, tobacco abuse, COVID-19, diverticulosis, colon polyps, prostate cancer metastatic to bone, urinary retention with indwelling Foley catheter, who underwent TURP on 09/08/2022 with Dr. Berneice Heinrich with pathology report showing prostatic adenocarcinoma.   Post procedure, he required further hospitalization 5 more days for decompensated respiratory failure.  He responded to breathing treatments, steroids, and aggressive pulmonary toileting. He returned to the hospital after developing hematuria.  He was also noted to be tachycardic and tachypneic in the ER. Urology was also consulted on admission.  He was initiated on CBI.  His hematuria resolved and he also underwent a voiding trial on 09/29/2022.  He did well on voiding trial and was able to be discharged home without further need for Foley.  He will follow-up outpatient with urology also.

## 2022-09-27 DIAGNOSIS — J9622 Acute and chronic respiratory failure with hypercapnia: Secondary | ICD-10-CM | POA: Diagnosis not present

## 2022-09-27 DIAGNOSIS — R310 Gross hematuria: Secondary | ICD-10-CM | POA: Diagnosis not present

## 2022-09-27 DIAGNOSIS — D62 Acute posthemorrhagic anemia: Secondary | ICD-10-CM | POA: Diagnosis not present

## 2022-09-27 DIAGNOSIS — J9621 Acute and chronic respiratory failure with hypoxia: Secondary | ICD-10-CM

## 2022-09-27 LAB — COMPREHENSIVE METABOLIC PANEL
ALT: 13 U/L (ref 0–44)
AST: 13 U/L — ABNORMAL LOW (ref 15–41)
Albumin: 2.3 g/dL — ABNORMAL LOW (ref 3.5–5.0)
Alkaline Phosphatase: 56 U/L (ref 38–126)
Anion gap: 5 (ref 5–15)
BUN: 17 mg/dL (ref 8–23)
CO2: 38 mmol/L — ABNORMAL HIGH (ref 22–32)
Calcium: 8.2 mg/dL — ABNORMAL LOW (ref 8.9–10.3)
Chloride: 99 mmol/L (ref 98–111)
Creatinine, Ser: 0.64 mg/dL (ref 0.61–1.24)
GFR, Estimated: >60 mL/min (ref >60)
Glucose, Bld: 112 mg/dL — ABNORMAL HIGH (ref 70–99)
Potassium: 3.7 mmol/L (ref 3.5–5.1)
Sodium: 142 mmol/L (ref 135–145)
Total Bilirubin: 0.1 mg/dL — ABNORMAL LOW (ref 0.3–1.2)
Total Protein: 5.6 g/dL — ABNORMAL LOW (ref 6.5–8.1)

## 2022-09-27 LAB — CBC
HCT: 25.2 % — ABNORMAL LOW (ref 39.0–52.0)
Hemoglobin: 7.6 g/dL — ABNORMAL LOW (ref 13.0–17.0)
MCH: 28.8 pg (ref 26.0–34.0)
MCHC: 30.2 g/dL (ref 30.0–36.0)
MCV: 95.5 fL (ref 80.0–100.0)
Platelets: 161 10*3/uL (ref 150–400)
RBC: 2.64 MIL/uL — ABNORMAL LOW (ref 4.22–5.81)
RDW: 13.1 % (ref 11.5–15.5)
WBC: 9.7 10*3/uL (ref 4.0–10.5)
nRBC: 0 % (ref 0.0–0.2)

## 2022-09-27 MED ORDER — IPRATROPIUM-ALBUTEROL 0.5-2.5 (3) MG/3ML IN SOLN
3.0000 mL | Freq: Three times a day (TID) | RESPIRATORY_TRACT | Status: DC
  Administered 2022-09-27 – 2022-09-30 (×9): 3 mL via RESPIRATORY_TRACT
  Filled 2022-09-27 (×9): qty 3

## 2022-09-27 NOTE — Progress Notes (Signed)
Progress Note    Frank [name]   UJW:[id number]  DOB: [birth year]  DOA: 09/25/2022     2 PCP: Frank Sickles, MD  Initial CC: hematuria  Hospital Course: Frank [name] is a 67 y.o. male with medical history significant of osteoarthritis, COPD/emphysema with chronic respiratory failure with hypoxia on home oxygen at 5 LPM via [place], nephrolithiasis, history of pneumonia, tobacco abuse, COVID-19, diverticulosis, colon polyps, prostate cancer metastatic to bone, urinary retention with indwelling Foley catheter, who underwent TURP on 09/08/2022 with Dr. Berneice Heinrich with pathology report showing prostatic adenocarcinoma.   Post procedure, he required further hospitalization 5 more days for decompensated respiratory failure.  He responded to breathing treatments, steroids, and aggressive pulmonary toileting. He returned to the hospital after developing hematuria.  He was also noted to be tachycardic and tachypneic in the ER. Urology was also consulted on admission.  He was initiated on CBI.  Interval History:  No events overnight but when seen this morning he was short of breath prior to attempting mobility.  He was eventually able to transfer to the chair but did become hypoxic but recovered.   Assessment and Plan:  Acute blood loss anemia (ABLA) Hematuria Prostate cancer metastatic to bone (HCC)  -Pt is continued on CBI at time of presentation - continue trenging Hgb and monitoring foley output -Continue Zytiga 1000 mg p.o. daily. -Urology is following   Acute on chronic respiratory failure with hypoxia and hypercapnia (HCC) COPD (chronic obstructive pulmonary disease) (HCC) Multifocal pneumonia  Continue supplemental oxygen. Scheduled and as needed bronchodilators. -cont daily prednisone Continue ceftriaxone and azithro Urine strep pneumo neg  Old records reviewed in assessment of this patient  Antimicrobials: Azithro 5/20 >> current  Rocephin 5/20 >> current   DVT prophylaxis:  SCDs  Start: 09/25/22 1540   Code Status:   Code Status: Full Code  Mobility Assessment (last 72 hours)     Mobility Assessment     Row Name 09/27/22 0918 09/26/22 2007 09/26/22 1606 09/26/22 0720 09/25/22 2000   Does patient have an order for bedrest or is patient medically unstable No - Continue assessment No - Continue assessment -- No - Continue assessment No - Continue assessment   What is the highest level of mobility based on the progressive mobility assessment? Level 4 (Walks with assist in room) - Balance while marching in place and cannot step forward and back - Complete Level 3 (Stands with assist) - Balance while standing  and cannot march in place Level 4 (Walks with assist in room) - Balance while marching in place and cannot step forward and back - Complete Level 4 (Walks with assist in room) - Balance while marching in place and cannot step forward and back - Complete Level 3 (Stands with assist) - Balance while standing  and cannot march in place   Is the above level different from baseline mobility prior to current illness? Yes - Recommend PT order Yes - Recommend PT order -- -- Yes - Recommend PT order    Row Name 09/25/22 1645           Does patient have an order for bedrest or is patient medically unstable No - Continue assessment       What is the highest level of mobility based on the progressive mobility assessment? Level 4 (Walks with assist in room) - Balance while marching in place and cannot step forward and back - Complete  Barriers to discharge: none Disposition Plan:  Home Status is: Inpt  Objective: Blood pressure 123/68, pulse 92, temperature 98.7 F (37.1 C), temperature source Oral, resp. rate 20, height 5\' 8"  (1.727 m), weight 68.1 kg, SpO2 94 %.  Examination:  Physical Exam Constitutional:      General: He is not in acute distress. HENT:     Head: Normocephalic and atraumatic.     Mouth/Throat:     Mouth: Mucous membranes are  moist.  Eyes:     Extraocular Movements: Extraocular movements intact.  Cardiovascular:     Rate and Rhythm: Normal rate and regular rhythm.  Pulmonary:     Effort: Pulmonary effort is normal. No respiratory distress.  Abdominal:     General: Bowel sounds are normal. There is no distension.     Palpations: Abdomen is soft.     Tenderness: There is no abdominal tenderness.  Genitourinary:    Comments: Hematuria noted in foley bag Musculoskeletal:        General: Normal range of motion.     Cervical back: Normal range of motion and neck supple.  Skin:    General: Skin is warm.  Neurological:     General: No focal deficit present.     Mental Status: He is alert.  Psychiatric:        Mood and Affect: Mood normal.      Consultants:  Urology  Procedures:    Data Reviewed: Results for orders placed or performed during the hospital encounter of 09/25/22 (from the past 24 hour(s))  Comprehensive metabolic panel     Status: Abnormal   Collection Time: 09/27/22  5:21 AM  Result Value Ref Range   Sodium 142 135 - 145 mmol/L   Potassium 3.7 3.5 - 5.1 mmol/L   Chloride 99 98 - 111 mmol/L   CO2 38 (H) 22 - 32 mmol/L   Glucose, Bld 112 (H) 70 - 99 mg/dL   BUN 17 8 - 23 mg/dL   Creatinine, Ser 0.98 0.61 - 1.24 mg/dL   Calcium 8.2 (L) 8.9 - 10.3 mg/dL   Total Protein 5.6 (L) 6.5 - 8.1 g/dL   Albumin 2.3 (L) 3.5 - 5.0 g/dL   AST 13 (L) 15 - 41 U/L   ALT 13 0 - 44 U/L   Alkaline Phosphatase 56 38 - 126 U/L   Total Bilirubin 0.1 (L) 0.3 - 1.2 mg/dL   GFR, Estimated >11 >91 mL/min   Anion gap 5 5 - 15  CBC     Status: Abnormal   Collection Time: 09/27/22  5:21 AM  Result Value Ref Range   WBC 9.7 4.0 - 10.5 K/uL   RBC 2.64 (L) 4.22 - 5.81 MIL/uL   Hemoglobin 7.6 (L) 13.0 - 17.0 g/dL   HCT 47.8 (L) 29.5 - 62.1 %   MCV 95.5 80.0 - 100.0 fL   MCH 28.8 26.0 - 34.0 pg   MCHC 30.2 30.0 - 36.0 g/dL   RDW 30.8 65.7 - 84.6 %   Platelets 161 150 - 400 K/uL   nRBC 0.0 0.0 - 0.2 %     I have reviewed pertinent nursing notes, vitals, labs, and images as necessary. I have ordered labwork to follow up on as indicated.  I have reviewed the last notes from staff over past 24 hours. I have discussed patient's care plan and test results with nursing staff, CM/SW, and other staff as appropriate.  Time spent: Greater than 50% of the 55 minute visit  was spent in counseling/coordination of care for the patient as laid out in the A&P.   LOS: 2 days   Lewie Chamber, MD [facility] 09/27/2022, 2:11 PM

## 2022-09-27 NOTE — Progress Notes (Signed)
Urology Progress Note  History of Present Illness: Frank [name] is a 67 year old male known to our practice.  He was recently discharged on 09/13/2022 from [facility] following a transurethral prostate resection with Dr. Berneice Heinrich, and a protracted stay due to his significantly diminished lung function.  Patient reports that he had not had any difficulty with hematuria up until yesterday morning.  Unclear if Foley was pulled traumatically and unable to determine if an infectious source was present.  On my arrival patient was alert, oriented, and in no distress.  He was not uncomfortable and amenable to the plan listed below.  5/22: Up in chair having lunch on afternoon rounds.  He was accompanied by his sister.  CBI had been stopped on morning rounds. Relatively dark again by around 2pm. Restarted at slow gtt with instructions for nursing to recheck and titrate in 15 mins.   Past Medical History:  Diagnosis Date   Arthritis    Cancer (HCC)    COPD (chronic obstructive pulmonary disease) (HCC) 06/05/2012   COVID-19 02/28/2020   Diverticulosis of colon 06/09/2016   Dyspnea    Emphysema 06/07/2012   Heart murmur    hx of as baby   History of colonic polyps 06/17/2014   History of kidney stones    Hypoxemia 06/07/2012   Obstructive chronic bronchitis with exacerbation (HCC) 06/07/2012   Pneumonia    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   Tobacco abuse 06/05/2012   Past Surgical History:  Procedure Laterality Date   CATARACT EXTRACTION, BILATERAL     CYSTOSCOPY WITH LITHOLAPAXY N/A 01/13/2022   Procedure: CYSTOSCOPY WITH LITHOLAPAXY;  Surgeon: Sebastian Ache, MD;  Location: [facility];  Service: Urology;  Laterality: N/A;   CYSTOSCOPY/RETROGRADE/URETEROSCOPY/STONE EXTRACTION WITH BASKET Right 10/04/2016   Procedure: CYSTOSCOPY/RETROGRADE/URETEROSCOPY/STONE EXTRACTION WITH BASKET/ STENT;  Surgeon: Sebastian Ache, MD;  Location: [facility];  Service: Urology;  Laterality: Right;   With LASER   CYSTOSCOPY/URETEROSCOPY/HOLMIUM LASER/STENT PLACEMENT Right 01/13/2022   Procedure: CYSTOSCOPY/URETEROSCOPY/HOLMIUM LASER/STENT PLACEMENT;  Surgeon: Sebastian Ache, MD;  Location: [facility];  Service: Urology;  Laterality: Right;   EXTRACORPOREAL SHOCK WAVE LITHOTRIPSY Right 06/29/2016   Procedure: RIGHT EXTRACORPOREAL SHOCK WAVE LITHOTRIPSY (ESWL);  Surgeon: Crist Fat, MD;  Location: [facility];  Service: Urology;  Laterality: Right;   TRANSURETHRAL RESECTION OF PROSTATE N/A 09/08/2022   Procedure: TRANSURETHRAL RESECTION OF THE PROSTATE (TURP);  Surgeon: Loletta Parish., MD;  Location: [facility];  Service: Urology;  Laterality: N/A;  75 MINS    Medications: I have reviewed the patient's current medications. Allergies: No Known Allergies  Family History  Problem Relation Age of Onset   COPD Mother    Colon polyps Father    Heart attack Father 19   Colon polyps Sister    Social History:  reports that he quit smoking about 7 years ago. His smoking use included cigarettes. He has a 17.50 pack-year smoking history. He quit smokeless tobacco use about 54 years ago.  His smokeless tobacco use included chew. He reports that he does not drink alcohol and does not use drugs.  ROS: All systems are reviewed and negative except as noted. Hematuria  Physical Exam:  Vital signs in last 24 hours: Temp:  [98 F (36.7 C)-98.7 F (37.1 C)] 98.7 F (37.1 C) (05/22 1257) Pulse Rate:  [91-94] 92 (05/22 1257) Resp:  [20] 20 (05/22 1257) BP: (115-129)/(62-70) 123/68 (05/22 1257) SpO2:  [88 %-99 %] 94 % (05/22 1409) FiO2 (%):  [40 %]  40 % (05/21 1532)  Cardiovascular: Skin warm; not flushed Respiratory: Breaths quiet; no shortness of breath Abdomen: No masses, soft, no guarding or rebound Neurological: Normal sensation to touch Musculoskeletal: Normal motor function arms and legs Skin: No rashes Genitourinary: Three-way 20 F hematuria catheter in place draining lear medium red urine  on slow gtt.  Laboratory Data:  Results for orders placed or performed during the hospital encounter of 09/25/22 (from the past 72 hour(s))  Comprehensive metabolic panel     Status: Abnormal   Collection Time: 09/25/22 10:29 AM  Result Value Ref Range   Sodium 137 135 - 145 mmol/L   Potassium 3.8 3.5 - 5.1 mmol/L   Chloride 91 (L) 98 - 111 mmol/L   CO2 37 (H) 22 - 32 mmol/L   Glucose, Bld 149 (H) 70 - 99 mg/dL    Comment: Glucose reference range applies only to samples taken after fasting for at least 8 hours.   BUN 26 (H) 8 - 23 mg/dL   Creatinine, Ser 8.11 0.61 - 1.24 mg/dL   Calcium 9.1 8.9 - 91.4 mg/dL   Total Protein 6.9 6.5 - 8.1 g/dL   Albumin 3.2 (L) 3.5 - 5.0 g/dL   AST 17 15 - 41 U/L   ALT 15 0 - 44 U/L   Alkaline Phosphatase 78 38 - 126 U/L   Total Bilirubin 0.6 0.3 - 1.2 mg/dL   GFR, Estimated >78 >29 mL/min    Comment: (NOTE) Calculated using the CKD-EPI Creatinine Equation (2021)    Anion gap 9 5 - 15    Comment: Performed at [facility], [address], [place], [place] [address]  Lipase, blood     Status: None   Collection Time: 09/25/22 10:29 AM  Result Value Ref Range   Lipase 35 11 - 51 U/L    Comment: Performed at [facility], [address], [place], [place] [address]  CBC with Diff     Status: Abnormal   Collection Time: 09/25/22 10:29 AM  Result Value Ref Range   WBC 19.7 (H) 4.0 - 10.5 K/uL   RBC 3.91 (L) 4.22 - 5.81 MIL/uL   Hemoglobin 11.2 (L) 13.0 - 17.0 g/dL   HCT 84.6 (L) 96.2 - 95.2 %   MCV 94.9 80.0 - 100.0 fL   MCH 28.6 26.0 - 34.0 pg   MCHC 30.2 30.0 - 36.0 g/dL   RDW 84.1 32.4 - 40.1 %   Platelets 242 150 - 400 K/uL   nRBC 0.0 0.0 - 0.2 %   Neutrophils Relative % 79 %   Neutro Abs 15.8 (H) 1.7 - 7.7 K/uL   Lymphocytes Relative 8 %   Lymphs Abs 1.5 0.7 - 4.0 K/uL   Monocytes Relative 9 %   Monocytes Absolute 1.8 (H) 0.1 - 1.0 K/uL   Eosinophils Relative 1 %   Eosinophils Absolute 0.2 0.0 -  0.5 K/uL   Basophils Relative 1 %   Basophils Absolute 0.2 (H) 0.0 - 0.1 K/uL   Immature Granulocytes 2 %   Abs Immature Granulocytes 0.31 (H) 0.00 - 0.07 K/uL    Comment: Performed at [facility], [address], [place], [place] [address]  Urinalysis, Routine w reflex microscopic -Urine, Catheterized; Indwelling urinary catheter     Status: Abnormal   Collection Time: 09/25/22 10:29 AM  Result Value Ref Range   Color, Urine RED (A) YELLOW    Comment: BIOCHEMICALS MAY BE AFFECTED BY COLOR  APPearance TURBID (A) CLEAR   Specific Gravity, Urine  1.005 - 1.030    TEST NOT REPORTED DUE TO COLOR INTERFERENCE OF URINE PIGMENT   pH  5.0 - 8.0    TEST NOT REPORTED DUE TO COLOR INTERFERENCE OF URINE PIGMENT   Glucose, UA (A) NEGATIVE mg/dL    TEST NOT REPORTED DUE TO COLOR INTERFERENCE OF URINE PIGMENT   Hgb urine dipstick (A) NEGATIVE    TEST NOT REPORTED DUE TO COLOR INTERFERENCE OF URINE PIGMENT   Bilirubin Urine (A) NEGATIVE    TEST NOT REPORTED DUE TO COLOR INTERFERENCE OF URINE PIGMENT   Ketones, ur (A) NEGATIVE mg/dL    TEST NOT REPORTED DUE TO COLOR INTERFERENCE OF URINE PIGMENT   Protein, ur (A) NEGATIVE mg/dL    TEST NOT REPORTED DUE TO COLOR INTERFERENCE OF URINE PIGMENT   Nitrite (A) NEGATIVE    TEST NOT REPORTED DUE TO COLOR INTERFERENCE OF URINE PIGMENT   Leukocytes,Ua (A) NEGATIVE    TEST NOT REPORTED DUE TO COLOR INTERFERENCE OF URINE PIGMENT   RBC / HPF >50 0 - 5 RBC/hpf   WBC, UA 0-5 0 - 5 WBC/hpf   Bacteria, UA NONE SEEN NONE SEEN   Squamous Epithelial / HPF 0-5 0 - 5 /HPF   WBC Clumps PRESENT     Comment: Performed at [facility], [address], [place], [place] [address]  Troponin I (High Sensitivity)     Status: None   Collection Time: 09/25/22 10:29 AM  Result Value Ref Range   Troponin I (High Sensitivity) 10 <18 ng/L    Comment: (NOTE) Elevated high sensitivity troponin I (hsTnI) values and significant  changes  across serial measurements may suggest ACS but many other  chronic and acute conditions are known to elevate hsTnI results.  Refer to the "Links" section for chest pain algorithms and additional  guidance. Performed at [facility], [address] [place], [place] [address]   D-dimer, quantitative     Status: Abnormal   Collection Time: 09/25/22 10:29 AM  Result Value Ref Range   D-Dimer, Quant 1.59 (H) 0.00 - 0.50 ug/mL-FEU    Comment: (NOTE) At the manufacturer cut-off value of 0.5 g/mL FEU, this assay has a negative predictive value of 95-100%.This assay is intended for use in conjunction with a clinical pretest probability (PTP) assessment model to exclude pulmonary embolism (PE) and deep venous thrombosis (DVT) in outpatients suspected of PE or DVT. Results should be correlated with clinical presentation. Performed at [facility], [address] [place], [place] [address]   Strep pneumoniae urinary antigen     Status: None   Collection Time: 09/25/22 10:29 AM  Result Value Ref Range   Strep Pneumo Urinary Antigen NEGATIVE NEGATIVE    Comment:        Infection due to S. pneumoniae cannot be absolutely ruled out since the antigen present may be below the detection limit of the test. Performed at [facility], [address], [place], [place] [address]   Troponin I (High Sensitivity)     Status: None   Collection Time: 09/25/22  1:22 PM  Result Value Ref Range   Troponin I (High Sensitivity) 9 <18 ng/L    Comment: (NOTE) Elevated high sensitivity troponin I (hsTnI) values and significant  changes across serial measurements may suggest ACS but many other  chronic and acute conditions are known to elevate hsTnI results.  Refer to the "Links" section  for chest pain algorithms and additional  guidance. Performed at [facility], [address] [place], [place] [address]   Procalcitonin     Status: None    Collection Time: 09/25/22  1:22 PM  Result Value Ref Range   Procalcitonin 0.26 ng/mL    Comment:        Interpretation: PCT (Procalcitonin) <= 0.5 ng/mL: Systemic infection (sepsis) is not likely. Local bacterial infection is possible. (NOTE)       Sepsis PCT Algorithm           Lower Respiratory Tract                                      Infection PCT Algorithm    ----------------------------     ----------------------------         PCT < 0.25 ng/mL                PCT < 0.10 ng/mL          Strongly encourage             Strongly discourage   discontinuation of antibiotics    initiation of antibiotics    ----------------------------     -----------------------------       PCT 0.25 - 0.50 ng/mL            PCT 0.10 - 0.25 ng/mL               OR       >80% decrease in PCT            Discourage initiation of                                            antibiotics      Encourage discontinuation           of antibiotics    ----------------------------     -----------------------------         PCT >= 0.50 ng/mL              PCT 0.26 - 0.50 ng/mL               AND        <80% decrease in PCT             Encourage initiation of                                             antibiotics       Encourage continuation           of antibiotics    ----------------------------     -----------------------------        PCT >= 0.50 ng/mL                  PCT > 0.50 ng/mL               AND         increase in PCT                  Strongly encourage  initiation of antibiotics    Strongly encourage escalation           of antibiotics                                     -----------------------------                                           PCT <= 0.25 ng/mL                                                 OR                                        > 80% decrease in PCT                                      Discontinue / Do not initiate                                              antibiotics  Performed at [facility], [address] [place], [place] [address]   Blood culture (routine x 2)     Status: None (Preliminary result)   Collection Time: 09/25/22  2:55 PM   Specimen: BLOOD RIGHT FOREARM  Result Value Ref Range   Specimen Description      BLOOD RIGHT FOREARM Performed at [facility], [address] [place], [place] [address]    Special Requests      BOTTLES DRAWN AEROBIC AND ANAEROBIC Blood Culture results may not be optimal due to an excessive volume of blood received in culture bottles Performed at [facility], [address] [place], [place] [address]    Culture      NO GROWTH 2 DAYS Performed at [facility], [address], [place], [place] [address]    Report Status PENDING   Blood culture (routine x 2)     Status: None (Preliminary result)   Collection Time: 09/25/22  3:07 PM   Specimen: BLOOD RIGHT HAND  Result Value Ref Range   Specimen Description      BLOOD RIGHT HAND Performed at [facility], [address], [place], [place] [address]    Special Requests      BOTTLES DRAWN AEROBIC AND ANAEROBIC Blood Culture results may not be optimal due to an excessive volume of blood received in culture bottles Performed at [facility], [address] [place], [place] [address]    Culture      NO GROWTH 2 DAYS Performed at [facility], [address] [place], [place] [address]    Report Status PENDING   Lactic acid, plasma     Status: None   Collection Time: 09/25/22  5:10 PM  Result Value Ref Range   Lactic  Acid, Venous 1.0 0.5 - 1.9 mmol/L    Comment: Performed at [facility], [address] [place], [place] [address]  Type and screen [facility]     Status: None   Collection Time: 09/25/22  6:58 PM  Result Value Ref Range   ABO/RH(D) O POS    Antibody Screen NEG     Sample Expiration      09/28/2022,2359 Performed at [facility], [address], [place], [place] [address]   Hemoglobin and hematocrit, blood     Status: Abnormal   Collection Time: 09/25/22  7:11 PM  Result Value Ref Range   Hemoglobin 9.4 (L) 13.0 - 17.0 g/dL   HCT 29.5 (L) 62.1 - 30.8 %    Comment: Performed at [facility], [address], [place], [place] [address]  ABO/Rh     Status: None   Collection Time: 09/25/22  8:20 PM  Result Value Ref Range   ABO/RH(D)      O POS Performed at [facility], [address], [place], [place] [address]   MRSA Next Gen by PCR, Nasal     Status: None   Collection Time: 09/25/22 10:41 PM   Specimen: Nasal Mucosa; Nasal Swab  Result Value Ref Range   MRSA by PCR Next Gen NOT DETECTED NOT DETECTED    Comment: (NOTE) The GeneXpert MRSA Assay (FDA approved for NASAL specimens only), is one component of a comprehensive MRSA colonization surveillance program. It is not intended to diagnose MRSA infection nor to guide or monitor treatment for MRSA infections. Test performance is not FDA approved in patients less than 64 years old. Performed at [facility], [address], [place], [place] [address]   CBC     Status: Abnormal   Collection Time: 09/26/22  5:25 AM  Result Value Ref Range   WBC 14.7 (H) 4.0 - 10.5 K/uL   RBC 2.78 (L) 4.22 - 5.81 MIL/uL   Hemoglobin 8.0 (L) 13.0 - 17.0 g/dL   HCT 32.4 (L) 40.1 - 02.7 %   MCV 95.0 80.0 - 100.0 fL   MCH 28.8 26.0 - 34.0 pg   MCHC 30.3 30.0 - 36.0 g/dL   RDW 25.3 66.4 - 40.3 %   Platelets 174 150 - 400 K/uL   nRBC 0.0 0.0 - 0.2 %    Comment: Performed at [facility], [address] [place], [place] [address]  Comprehensive metabolic panel     Status: Abnormal   Collection Time: 09/26/22  5:25 AM  Result Value Ref Range   Sodium 136 135 - 145 mmol/L   Potassium 3.7 3.5 - 5.1 mmol/L   Chloride  95 (L) 98 - 111 mmol/L   CO2 36 (H) 22 - 32 mmol/L   Glucose, Bld 99 70 - 99 mg/dL    Comment: Glucose reference range applies only to samples taken after fasting for at least 8 hours.   BUN 20 8 - 23 mg/dL   Creatinine, Ser 9.56 0.61 - 1.24 mg/dL   Calcium 8.1 (L) 8.9 - 10.3 mg/dL   Total Protein 5.3 (L) 6.5 - 8.1 g/dL   Albumin 2.4 (L) 3.5 - 5.0 g/dL   AST 14 (L) 15 - 41 U/L   ALT 12 0 - 44 U/L   Alkaline Phosphatase 56 38 - 126 U/L   Total Bilirubin 0.9 0.3 - 1.2 mg/dL   GFR, Estimated >38 >75 mL/min  Comment: (NOTE) Calculated using the CKD-EPI Creatinine Equation (2021)    Anion gap 5 5 - 15    Comment: Performed at [facility], [address], [place], [place] [address]  Procalcitonin     Status: None   Collection Time: 09/26/22  5:25 AM  Result Value Ref Range   Procalcitonin 0.48 ng/mL    Comment:        Interpretation: PCT (Procalcitonin) <= 0.5 ng/mL: Systemic infection (sepsis) is not likely. Local bacterial infection is possible. (NOTE)       Sepsis PCT Algorithm           Lower Respiratory Tract                                      Infection PCT Algorithm    ----------------------------     ----------------------------         PCT < 0.25 ng/mL                PCT < 0.10 ng/mL          Strongly encourage             Strongly discourage   discontinuation of antibiotics    initiation of antibiotics    ----------------------------     -----------------------------       PCT 0.25 - 0.50 ng/mL            PCT 0.10 - 0.25 ng/mL               OR       >80% decrease in PCT            Discourage initiation of                                            antibiotics      Encourage discontinuation           of antibiotics    ----------------------------     -----------------------------         PCT >= 0.50 ng/mL              PCT 0.26 - 0.50 ng/mL               AND        <80% decrease in PCT             Encourage initiation of                                              antibiotics       Encourage continuation           of antibiotics    ----------------------------     -----------------------------        PCT >= 0.50 ng/mL                  PCT > 0.50 ng/mL               AND         increase in PCT                  Strongly encourage  initiation of antibiotics    Strongly encourage escalation           of antibiotics                                     -----------------------------                                           PCT <= 0.25 ng/mL                                                 OR                                        > 80% decrease in PCT                                      Discontinue / Do not initiate                                             antibiotics  Performed at [facility], [address], [place], [place] [address]   Hemoglobin and hematocrit, blood     Status: Abnormal   Collection Time: 09/26/22 12:20 PM  Result Value Ref Range   Hemoglobin 8.4 (L) 13.0 - 17.0 g/dL   HCT 60.4 (L) 54.0 - 98.1 %    Comment: Performed at [facility], [address] [place], [place] [address]  Comprehensive metabolic panel     Status: Abnormal   Collection Time: 09/27/22  5:21 AM  Result Value Ref Range   Sodium 142 135 - 145 mmol/L   Potassium 3.7 3.5 - 5.1 mmol/L   Chloride 99 98 - 111 mmol/L   CO2 38 (H) 22 - 32 mmol/L   Glucose, Bld 112 (H) 70 - 99 mg/dL    Comment: Glucose reference range applies only to samples taken after fasting for at least 8 hours.   BUN 17 8 - 23 mg/dL   Creatinine, Ser 8.29 0.61 - 1.24 mg/dL   Calcium 8.2 (L) 8.9 - 10.3 mg/dL   Total Protein 5.6 (L) 6.5 - 8.1 g/dL   Albumin 2.3 (L) 3.5 - 5.0 g/dL   AST 13 (L) 15 - 41 U/L   ALT 13 0 - 44 U/L   Alkaline Phosphatase 56 38 - 126 U/L   Total Bilirubin 0.1 (L) 0.3 - 1.2 mg/dL   GFR, Estimated >56 >21 mL/min    Comment: (NOTE) Calculated using the CKD-EPI  Creatinine Equation (2021)    Anion gap 5 5 - 15    Comment: Performed at [facility], [address] [place], [place] [address]  CBC     Status: Abnormal   Collection Time: 09/27/22  5:21 AM  Result Value Ref Range   WBC 9.7 4.0 - 10.5 K/uL  RBC 2.64 (L) 4.22 - 5.81 MIL/uL   Hemoglobin 7.6 (L) 13.0 - 17.0 g/dL   HCT 16.1 (L) 09.6 - 04.5 %   MCV 95.5 80.0 - 100.0 fL   MCH 28.8 26.0 - 34.0 pg   MCHC 30.2 30.0 - 36.0 g/dL   RDW 40.9 81.1 - 91.4 %   Platelets 161 150 - 400 K/uL   nRBC 0.0 0.0 - 0.2 %    Comment: Performed at [facility], [address] [place], [place] [address]   Recent Results (from the past 240 hour(s))  Blood culture (routine x 2)     Status: None (Preliminary result)   Collection Time: 09/25/22  2:55 PM   Specimen: BLOOD RIGHT FOREARM  Result Value Ref Range Status   Specimen Description   Final    BLOOD RIGHT FOREARM Performed at [facility], [address] [place], [place] [address]    Special Requests   Final    BOTTLES DRAWN AEROBIC AND ANAEROBIC Blood Culture results may not be optimal due to an excessive volume of blood received in culture bottles Performed at [facility], [address] [place], [place] [address]    Culture   Final    NO GROWTH 2 DAYS Performed at [facility], [address] [place], [place] [address]    Report Status PENDING  Incomplete  Blood culture (routine x 2)     Status: None (Preliminary result)   Collection Time: 09/25/22  3:07 PM   Specimen: BLOOD RIGHT HAND  Result Value Ref Range Status   Specimen Description   Final    BLOOD RIGHT HAND Performed at [facility], [address] [place], [place] [address]    Special Requests   Final    BOTTLES DRAWN AEROBIC AND ANAEROBIC Blood Culture results may not be optimal due to an excessive volume of blood received in culture bottles Performed at [facility], [address], [place], [place] [address]    Culture   Final    NO GROWTH 2 DAYS Performed at [facility], [address], [place], [place] [address]    Report Status PENDING  Incomplete  MRSA Next Gen by PCR, Nasal     Status: None   Collection Time: 09/25/22 10:41 PM   Specimen: Nasal Mucosa; Nasal Swab  Result Value Ref Range Status   MRSA by PCR Next Gen NOT DETECTED NOT DETECTED Final    Comment: (NOTE) The GeneXpert MRSA Assay (FDA approved for NASAL specimens only), is one component of a comprehensive MRSA colonization surveillance program. It is not intended to diagnose MRSA infection nor to guide or monitor treatment for MRSA infections. Test performance is not FDA approved in patients less than 61 years old. Performed at [facility], [address], [place], [place] [address]    Creatinine: Recent Labs    09/25/22 1029 09/26/22 0525 09/27/22 0521  CREATININE 0.69 0.66 0.64    Imaging: CT A/P shows likely blood products and bladder   Assessment/Plan:  Daily CBC  Renal function at baseline, WNL  Urinalysis with color interference on almost all metrics.  Would recommend repeat urinalysis and culture after a couple of days of CBI to tease out hemorrhagic cystitis, but patient has been on broad-spectrum antibiotics and this would be low yield at this point.  Stopped and ultimately had to restart CBI today.  Will continue low GTT for  the time being and give him 24 more hours.  Failing improvement by that point we will make him n.p.o. at midnight for cystoscopy with fulguration sometime Friday.  Continue Zytiga  Case and plan reviewed with Dr. Milton Ferguson Samatha Anspach 09/27/2022, 3:30 PM  Pager: [phone]

## 2022-09-27 NOTE — Progress Notes (Signed)
Mobility Specialist - Progress Note   09/27/22 1034  Oxygen Therapy  O2 Device Nasal Cannula  O2 Flow Rate (L/min) 5 L/min  Mobility  Activity Transferred from bed to chair  Level of Assistance Standby assist, set-up cues, supervision of patient - no hands on  Assistive Device Front wheel walker  Distance Ambulated (ft) 2 ft  Activity Response Tolerated well  Mobility Referral Yes  $Mobility charge 1 Mobility  Mobility Specialist Start Time (ACUTE ONLY) 1008  Mobility Specialist Stop Time (ACUTE ONLY) 1033  Mobility Specialist Time Calculation (min) (ACUTE ONLY) 25 min   Pt received in bed and agreeable to transfer to recliner. Once pt sat up he desat to 77%. Ecouraged pursed lip breaths allowing O2 to come back up to 94%. MD in room at the time. Pt to recliner after session with all needs met.    Pre-mobility: 96 HR, 77% SpO2 During mobility: 88 HR, 94% SpO2 Post-mobility: 98 HR, 88% SPO2  Chief Technology Officer

## 2022-09-28 DIAGNOSIS — J9622 Acute and chronic respiratory failure with hypercapnia: Secondary | ICD-10-CM | POA: Diagnosis not present

## 2022-09-28 DIAGNOSIS — R310 Gross hematuria: Secondary | ICD-10-CM | POA: Diagnosis not present

## 2022-09-28 DIAGNOSIS — J9621 Acute and chronic respiratory failure with hypoxia: Secondary | ICD-10-CM | POA: Diagnosis not present

## 2022-09-28 DIAGNOSIS — D62 Acute posthemorrhagic anemia: Secondary | ICD-10-CM | POA: Diagnosis not present

## 2022-09-28 LAB — CBC WITH DIFFERENTIAL/PLATELET
Abs Immature Granulocytes: 0.07 10*3/uL (ref 0.00–0.07)
Basophils Absolute: 0.1 10*3/uL (ref 0.0–0.1)
Basophils Relative: 1 %
Eosinophils Absolute: 0.2 10*3/uL (ref 0.0–0.5)
Eosinophils Relative: 2 %
HCT: 24.7 % — ABNORMAL LOW (ref 39.0–52.0)
Hemoglobin: 7.6 g/dL — ABNORMAL LOW (ref 13.0–17.0)
Immature Granulocytes: 1 %
Lymphocytes Relative: 17 %
Lymphs Abs: 1.4 10*3/uL (ref 0.7–4.0)
MCH: 29.2 pg (ref 26.0–34.0)
MCHC: 30.8 g/dL (ref 30.0–36.0)
MCV: 95 fL (ref 80.0–100.0)
Monocytes Absolute: 0.7 10*3/uL (ref 0.1–1.0)
Monocytes Relative: 8 %
Neutro Abs: 5.9 10*3/uL (ref 1.7–7.7)
Neutrophils Relative %: 71 %
Platelets: 185 10*3/uL (ref 150–400)
RBC: 2.6 MIL/uL — ABNORMAL LOW (ref 4.22–5.81)
RDW: 13 % (ref 11.5–15.5)
WBC: 8.3 10*3/uL (ref 4.0–10.5)
nRBC: 0 % (ref 0.0–0.2)

## 2022-09-28 LAB — BASIC METABOLIC PANEL
Anion gap: 5 (ref 5–15)
BUN: 18 mg/dL (ref 8–23)
CO2: 38 mmol/L — ABNORMAL HIGH (ref 22–32)
Calcium: 8.2 mg/dL — ABNORMAL LOW (ref 8.9–10.3)
Chloride: 96 mmol/L — ABNORMAL LOW (ref 98–111)
Creatinine, Ser: 0.66 mg/dL (ref 0.61–1.24)
GFR, Estimated: >60 mL/min (ref >60)
Glucose, Bld: 106 mg/dL — ABNORMAL HIGH (ref 70–99)
Potassium: 3.5 mmol/L (ref 3.5–5.1)
Sodium: 139 mmol/L (ref 135–145)

## 2022-09-28 LAB — MAGNESIUM: Magnesium: 2.3 mg/dL (ref 1.7–2.4)

## 2022-09-28 MED ORDER — ORAL CARE MOUTH RINSE: 15.0000 mL | OROMUCOSAL | Status: DC | PRN

## 2022-09-28 MED ORDER — GUAIFENESIN-DM 100-10 MG/5ML PO SYRP
5.0000 mL | ORAL_SOLUTION | ORAL | Status: DC | PRN
  Administered 2022-09-28: 5 mL via ORAL
  Filled 2022-09-28: qty 10

## 2022-09-28 NOTE — Progress Notes (Signed)
Physical Therapy Treatment Patient Details Name: Frank [name] MRN: [id number] DOB: 1955-06-11 Today's Date: 09/28/2022   History of Present Illness 67 y.o. male adm with ABLA, hematuria. PMH: osteoarthritis, COPD/emphysema with chronic respiratory failure with hypoxia on home oxygen at 5 LPM via [place], nephrolithiasis, pneumonia, tobacco abuse, COVID-19, diverticulosis, colon polyps, prostate cancer metastatic to bone, urinary retention with indwelling Foley catheter, TURP on 09/08/2022 with Dr. Berneice Heinrich- post op complication of decompensated respiratory status, stay prolonged ~5d;    PT Comments     Pt motivated to work with PT, continues to be limited by dyspnea/fatigue although tolerated incr activity compared to previous session. Pt with 3-4/4 DOE and  SpO2=83% on 6L after amb x 60',  provided seated rest;  pt requires extensive recovery time ~ 5+ minutes to recover sats to >90% on 6L HFNC pt able to amb back to room after seated rest with sats again in low 80s, recovers to 89% on 5L in ~ 3 minutes, HR 107.   Recommendations for follow up therapy are one component of a multi-disciplinary discharge planning process, led by the attending physician.  Recommendations may be updated based on patient status, additional functional criteria and insurance authorization.  Follow Up Recommendations       Assistance Recommended at Discharge Intermittent Supervision/Assistance  Patient can return home with the following A little help with bathing/dressing/bathroom;Assistance with cooking/housework;Assist for transportation;Help with stairs or ramp for entrance   Equipment Recommendations  None recommended by PT    Recommendations for Other Services       Precautions / Restrictions Precautions Precautions: Fall Precaution Comments: monitor O2 Restrictions Weight Bearing Restrictions: No     Mobility  Bed Mobility Overal bed mobility: Modified Independent Bed Mobility: Supine to Sit            General bed mobility comments: incr time, HOB elevated    Transfers Overall transfer level: Needs assistance Equipment used: Rolling walker (2 wheels) Transfers: Sit to/from Stand Sit to Stand: Supervision           General transfer comment: supervision for safety and lines. STS  from bed and recliner    Ambulation/Gait Ambulation/Gait assistance: Min guard, Supervision Gait Distance (Feet): 60 Feet (x2) Assistive device: Rolling walker (2 wheels) Gait Pattern/deviations: Step-through pattern, Trunk flexed       General Gait Details: cues for breathing and trunk extension as able; 3/4 DOE with SpO2=83% on 6L, provided seated rest; pt requires extensive recovery time ~ 5+ minutes to recover sats to >90%; pt able to amb back to room after seated rest   Stairs             Wheelchair Mobility    Modified Rankin (Stroke Patients Only)       Balance   Sitting-balance support: No upper extremity supported, Feet supported Sitting balance-Leahy Scale: Good       Standing balance-Leahy Scale: Fair Standing balance comment: reliant on device for amb                            Cognition Arousal/Alertness: Awake/alert Behavior During Therapy: WFL for tasks assessed/performed, Flat affect Overall Cognitive Status: Within Functional Limits for tasks assessed                                          Exercises  General Comments        Pertinent Vitals/Pain Pain Assessment Pain Assessment: No/denies pain    Home Living                          Prior Function            PT Goals (current goals can now be found in the care plan section) Acute Rehab PT Goals Patient Stated Goal: none stated PT Goal Formulation: With patient Time For Goal Achievement: 10/10/22 Potential to Achieve Goals: Good Progress towards PT goals: Progressing toward goals    Frequency    Min 3X/week      PT Plan Current  plan remains appropriate    Co-evaluation              AM-PAC PT "6 Clicks" Mobility   Outcome Measure  Help needed turning from your back to your side while in a flat bed without using bedrails?: None Help needed moving from lying on your back to sitting on the side of a flat bed without using bedrails?: A Little Help needed moving to and from a bed to a chair (including a wheelchair)?: A Little Help needed standing up from a chair using your arms (e.g., wheelchair or bedside chair)?: A Little Help needed to walk in hospital room?: A Little Help needed climbing 3-5 steps with a railing? : A Little 6 Click Score: 19    End of Session Equipment Utilized During Treatment: Gait belt;Oxygen Activity Tolerance: Patient limited by fatigue;Patient tolerated treatment well Patient left: in bed;with call bell/phone within reach;with bed alarm set Nurse Communication: Mobility status PT Visit Diagnosis: Other abnormalities of gait and mobility (R26.89);Difficulty in walking, not elsewhere classified (R26.2)     Time: 1610-9604 PT Time Calculation (min) (ACUTE ONLY): 34 min  Charges:  $Gait Training: 23-37 mins                     Mccabe Gloria, PT  Acute Rehab Dept ([facility]) [phone]  09/28/2022    [facility] 09/28/2022, 4:25 PM

## 2022-09-28 NOTE — Progress Notes (Signed)
Urology Progress Note  History of Present Illness: Frank [name] is a 67 year old male known to our practice.  He was recently discharged on 09/13/2022 from [facility] following a transurethral prostate resection with Dr. Berneice Heinrich, and a protracted stay due to his significantly diminished lung function.  Patient reports that he had not had any difficulty with hematuria up until yesterday morning.  Unclear if Foley was pulled traumatically and unable to determine if an infectious source was present.  On my arrival patient was alert, oriented, and in no distress.  He was not uncomfortable and amenable to the plan listed below.  5/23: No acute events overnight.  On rounds midday patient CBI was on slow gtt. with only a blush of hematuria.  Clamped on rounds. Past Medical History:  Diagnosis Date   Arthritis    Cancer (HCC)    COPD (chronic obstructive pulmonary disease) (HCC) 06/05/2012   COVID-19 02/28/2020   Diverticulosis of colon 06/09/2016   Dyspnea    Emphysema 06/07/2012   Heart murmur    hx of as baby   History of colonic polyps 06/17/2014   History of kidney stones    Hypoxemia 06/07/2012   Obstructive chronic bronchitis with exacerbation (HCC) 06/07/2012   Pneumonia    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   Tobacco abuse 06/05/2012   Past Surgical History:  Procedure Laterality Date   CATARACT EXTRACTION, BILATERAL     CYSTOSCOPY WITH LITHOLAPAXY N/A 01/13/2022   Procedure: CYSTOSCOPY WITH LITHOLAPAXY;  Surgeon: Sebastian Ache, MD;  [facility]: [facility];  Service: Urology;  Laterality: N/A;   CYSTOSCOPY/RETROGRADE/URETEROSCOPY/STONE EXTRACTION WITH BASKET Right 10/04/2016   Procedure: CYSTOSCOPY/RETROGRADE/URETEROSCOPY/STONE EXTRACTION WITH BASKET/ STENT;  Surgeon: Sebastian Ache, MD;  [facility];  Service: Urology;  Laterality: Right;  With LASER   CYSTOSCOPY/URETEROSCOPY/HOLMIUM LASER/STENT PLACEMENT Right 01/13/2022   Procedure:  CYSTOSCOPY/URETEROSCOPY/HOLMIUM LASER/STENT PLACEMENT;  Surgeon: Sebastian Ache, MD;  [facility];  Service: Urology;  Laterality: Right;   EXTRACORPOREAL SHOCK WAVE LITHOTRIPSY Right 06/29/2016   Procedure: RIGHT EXTRACORPOREAL SHOCK WAVE LITHOTRIPSY (ESWL);  Surgeon: Crist Fat, MD;  Location: [facility];  Service: Urology;  Laterality: Right;   TRANSURETHRAL RESECTION OF PROSTATE N/A 09/08/2022   Procedure: TRANSURETHRAL RESECTION OF THE PROSTATE (TURP);  Surgeon: Loletta Parish., MD;  Location: [facility];  Service: Urology;  Laterality: N/A;  75 MINS    Medications: I have reviewed the patient's current medications. Allergies: No Known Allergies  Family History  Problem Relation Age of Onset   COPD Mother    Colon polyps Father    Heart attack Father 37   Colon polyps Sister    Social History:  reports that he quit smoking about 7 years ago. His smoking use included cigarettes. He has a 17.50 pack-year smoking history. He quit smokeless tobacco use about 54 years ago.  His smokeless tobacco use included chew. He reports that he does not drink alcohol and does not use drugs.  ROS: All systems are reviewed and negative except as noted. Hematuria  Physical Exam:  Vital signs in last 24 hours: Temp:  [97.7 F (36.5 C)-98.4 F (36.9 C)] 97.7 F (36.5 C) (05/23 1253) Pulse Rate:  [89-95] 95 (05/23 1253) Resp:  [24] 24 (05/23 1253) BP: (112-133)/(58-66) 133/64 (05/23 1253) SpO2:  [89 %-96 %] 95 % (05/23 1353)  Cardiovascular: Skin warm; not flushed Respiratory: Breaths quiet; no shortness of breath Abdomen: No masses, soft, no guarding or rebound Neurological: Normal sensation to touch Musculoskeletal:  Normal motor function arms and legs Skin: No rashes Genitourinary: Three-way 20 F hematuria catheter in place draining light pink urine on slow gtt.  Laboratory Data:  Results for orders placed or performed during the hospital encounter of 09/25/22 (from the past 72  hour(s))  Blood culture (routine x 2)     Status: None (Preliminary result)   Collection Time: 09/25/22  2:55 PM   Specimen: BLOOD RIGHT FOREARM  Result Value Ref Range   Specimen Description      BLOOD RIGHT FOREARM Performed at [facility], [address] [place], [place] [address]    Special Requests      BOTTLES DRAWN AEROBIC AND ANAEROBIC Blood Culture results may not be optimal due to an excessive volume of blood received in culture bottles Performed at [facility], [address], [place], [place] [address]    Culture      NO GROWTH 3 DAYS Performed at [facility], [address], [place], [place] [address]    Report Status PENDING   Blood culture (routine x 2)     Status: None (Preliminary result)   Collection Time: 09/25/22  3:07 PM   Specimen: BLOOD RIGHT HAND  Result Value Ref Range   Specimen Description      BLOOD RIGHT HAND Performed at [facility], [address], [place], [place] [address]    Special Requests      BOTTLES DRAWN AEROBIC AND ANAEROBIC Blood Culture results may not be optimal due to an excessive volume of blood received in culture bottles Performed at [facility], [address] [place], [place] [address]    Culture      NO GROWTH 3 DAYS Performed at [facility], [address] [place], [place] [address]    Report Status PENDING   Lactic acid, plasma     Status: None   Collection Time: 09/25/22  5:10 PM  Result Value Ref Range   Lactic Acid, Venous 1.0 0.5 - 1.9 mmol/L    Comment: Performed at [facility], [address] [place], [place] [address]  Type and screen [facility]     Status: None   Collection Time: 09/25/22  6:58 PM  Result Value Ref Range   ABO/RH(D) O POS    Antibody Screen NEG    Sample Expiration      09/28/2022,2359 Performed at [facility], [address],  [place], [place] [address]   Hemoglobin and hematocrit, blood     Status: Abnormal   Collection Time: 09/25/22  7:11 PM  Result Value Ref Range   Hemoglobin 9.4 (L) 13.0 - 17.0 g/dL   HCT 88.4 (L) 16.6 - 06.3 %    Comment: Performed at [facility], [address], [place], [place] [address]  ABO/Rh     Status: None   Collection Time: 09/25/22  8:20 PM  Result Value Ref Range   ABO/RH(D)      O POS Performed at [facility], [address], [place], [place] [address]   MRSA Next Gen by PCR, Nasal     Status: None   Collection Time: 09/25/22 10:41 PM   Specimen: Nasal Mucosa; Nasal Swab  Result Value Ref Range   MRSA by PCR Next Gen NOT DETECTED NOT DETECTED    Comment: (NOTE) The GeneXpert MRSA Assay (FDA approved for NASAL specimens only), is one component of  a comprehensive MRSA colonization surveillance program. It is not intended to diagnose MRSA infection nor to guide or monitor treatment for MRSA infections. Test performance is not FDA approved in patients less than 74 years old. Performed at [facility], [address] [place], [place] [address]   CBC     Status: Abnormal   Collection Time: 09/26/22  5:25 AM  Result Value Ref Range   WBC 14.7 (H) 4.0 - 10.5 K/uL   RBC 2.78 (L) 4.22 - 5.81 MIL/uL   Hemoglobin 8.0 (L) 13.0 - 17.0 g/dL   HCT 60.4 (L) 54.0 - 98.1 %   MCV 95.0 80.0 - 100.0 fL   MCH 28.8 26.0 - 34.0 pg   MCHC 30.3 30.0 - 36.0 g/dL   RDW 19.1 47.8 - 29.5 %   Platelets 174 150 - 400 K/uL   nRBC 0.0 0.0 - 0.2 %    Comment: Performed at [facility], [address], [place], [place] [address]  Comprehensive metabolic panel     Status: Abnormal   Collection Time: 09/26/22  5:25 AM  Result Value Ref Range   Sodium 136 135 - 145 mmol/L   Potassium 3.7 3.5 - 5.1 mmol/L   Chloride 95 (L) 98 - 111 mmol/L   CO2 36 (H) 22 - 32 mmol/L   Glucose, Bld 99 70 - 99 mg/dL    Comment: Glucose  reference range applies only to samples taken after fasting for at least 8 hours.   BUN 20 8 - 23 mg/dL   Creatinine, Ser 8.65 0.61 - 1.24 mg/dL   Calcium 8.1 (L) 8.9 - 10.3 mg/dL   Total Protein 5.3 (L) 6.5 - 8.1 g/dL   Albumin 2.4 (L) 3.5 - 5.0 g/dL   AST 14 (L) 15 - 41 U/L   ALT 12 0 - 44 U/L   Alkaline Phosphatase 56 38 - 126 U/L   Total Bilirubin 0.9 0.3 - 1.2 mg/dL   GFR, Estimated >78 >46 mL/min    Comment: (NOTE) Calculated using the CKD-EPI Creatinine Equation (2021)    Anion gap 5 5 - 15    Comment: Performed at [facility], [address], [place], [place] [address]  Procalcitonin     Status: None   Collection Time: 09/26/22  5:25 AM  Result Value Ref Range   Procalcitonin 0.48 ng/mL    Comment:        Interpretation: PCT (Procalcitonin) <= 0.5 ng/mL: Systemic infection (sepsis) is not likely. Local bacterial infection is possible. (NOTE)       Sepsis PCT Algorithm           Lower Respiratory Tract                                      Infection PCT Algorithm    ----------------------------     ----------------------------         PCT < 0.25 ng/mL                PCT < 0.10 ng/mL          Strongly encourage             Strongly discourage   discontinuation of antibiotics    initiation of antibiotics    ----------------------------     -----------------------------       PCT 0.25 - 0.50 ng/mL  PCT 0.10 - 0.25 ng/mL               OR       >80% decrease in PCT            Discourage initiation of                                            antibiotics      Encourage discontinuation           of antibiotics    ----------------------------     -----------------------------         PCT >= 0.50 ng/mL              PCT 0.26 - 0.50 ng/mL               AND        <80% decrease in PCT             Encourage initiation of                                             antibiotics       Encourage continuation           of antibiotics     ----------------------------     -----------------------------        PCT >= 0.50 ng/mL                  PCT > 0.50 ng/mL               AND         increase in PCT                  Strongly encourage                                      initiation of antibiotics    Strongly encourage escalation           of antibiotics                                     -----------------------------                                           PCT <= 0.25 ng/mL                                                 OR                                        > 80% decrease in PCT  Discontinue / Do not initiate                                             antibiotics  Performed at [facility], [address] [place], [place] [address]   Hemoglobin and hematocrit, blood     Status: Abnormal   Collection Time: 09/26/22 12:20 PM  Result Value Ref Range   Hemoglobin 8.4 (L) 13.0 - 17.0 g/dL   HCT 60.4 (L) 54.0 - 98.1 %    Comment: Performed at [facility], [address] [place], [place] [address]  Comprehensive metabolic panel     Status: Abnormal   Collection Time: 09/27/22  5:21 AM  Result Value Ref Range   Sodium 142 135 - 145 mmol/L   Potassium 3.7 3.5 - 5.1 mmol/L   Chloride 99 98 - 111 mmol/L   CO2 38 (H) 22 - 32 mmol/L   Glucose, Bld 112 (H) 70 - 99 mg/dL    Comment: Glucose reference range applies only to samples taken after fasting for at least 8 hours.   BUN 17 8 - 23 mg/dL   Creatinine, Ser 8.29 0.61 - 1.24 mg/dL   Calcium 8.2 (L) 8.9 - 10.3 mg/dL   Total Protein 5.6 (L) 6.5 - 8.1 g/dL   Albumin 2.3 (L) 3.5 - 5.0 g/dL   AST 13 (L) 15 - 41 U/L   ALT 13 0 - 44 U/L   Alkaline Phosphatase 56 38 - 126 U/L   Total Bilirubin 0.1 (L) 0.3 - 1.2 mg/dL   GFR, Estimated >56 >21 mL/min    Comment: (NOTE) Calculated using the CKD-EPI Creatinine Equation (2021)    Anion gap 5 5 - 15    Comment: Performed at [facility], [address], [place], [place] [address]  CBC     Status: Abnormal   Collection Time: 09/27/22  5:21 AM  Result Value Ref Range   WBC 9.7 4.0 - 10.5 K/uL   RBC 2.64 (L) 4.22 - 5.81 MIL/uL   Hemoglobin 7.6 (L) 13.0 - 17.0 g/dL   HCT 78.4 (L) 69.6 - 29.5 %   MCV 95.5 80.0 - 100.0 fL   MCH 28.8 26.0 - 34.0 pg   MCHC 30.2 30.0 - 36.0 g/dL   RDW 28.4 13.2 - 44.0 %   Platelets 161 150 - 400 K/uL   nRBC 0.0 0.0 - 0.2 %    Comment: Performed at [facility], [address], [place], [place] [address]  Basic metabolic panel     Status: Abnormal   Collection Time: 09/28/22  5:10 AM  Result Value Ref Range   Sodium 139 135 - 145 mmol/L   Potassium 3.5 3.5 - 5.1 mmol/L   Chloride 96 (L) 98 - 111 mmol/L   CO2 38 (H) 22 - 32 mmol/L   Glucose, Bld 106 (H) 70 - 99 mg/dL    Comment: Glucose reference range applies only to samples taken after fasting for at least 8 hours.   BUN 18 8 - 23 mg/dL   Creatinine, Ser 5.36 0.61 - 1.24 mg/dL   Calcium 8.2 (L) 8.9 - 10.3 mg/dL   GFR, Estimated >64 >40 mL/min    Comment: (NOTE) Calculated using the CKD-EPI Creatinine Equation (2021)    Anion gap 5 5 - 15    Comment:  Performed at [facility], [address] [place], [place] [address]  CBC with Differential/Platelet     Status: Abnormal   Collection Time: 09/28/22  5:10 AM  Result Value Ref Range   WBC 8.3 4.0 - 10.5 K/uL   RBC 2.60 (L) 4.22 - 5.81 MIL/uL   Hemoglobin 7.6 (L) 13.0 - 17.0 g/dL   HCT 60.4 (L) 54.0 - 98.1 %   MCV 95.0 80.0 - 100.0 fL   MCH 29.2 26.0 - 34.0 pg   MCHC 30.8 30.0 - 36.0 g/dL   RDW 19.1 47.8 - 29.5 %   Platelets 185 150 - 400 K/uL   nRBC 0.0 0.0 - 0.2 %   Neutrophils Relative % 71 %   Neutro Abs 5.9 1.7 - 7.7 K/uL   Lymphocytes Relative 17 %   Lymphs Abs 1.4 0.7 - 4.0 K/uL   Monocytes Relative 8 %   Monocytes Absolute 0.7 0.1 - 1.0 K/uL   Eosinophils Relative 2 %   Eosinophils Absolute 0.2 0.0 - 0.5 K/uL    Basophils Relative 1 %   Basophils Absolute 0.1 0.0 - 0.1 K/uL   Immature Granulocytes 1 %   Abs Immature Granulocytes 0.07 0.00 - 0.07 K/uL    Comment: Performed at [facility], [address], [place], [place] [address]  Magnesium     Status: None   Collection Time: 09/28/22  5:10 AM  Result Value Ref Range   Magnesium 2.3 1.7 - 2.4 mg/dL    Comment: Performed at [facility], [address], [place], [place] [address]   Recent Results (from the past 240 hour(s))  Blood culture (routine x 2)     Status: None (Preliminary result)   Collection Time: 09/25/22  2:55 PM   Specimen: BLOOD RIGHT FOREARM  Result Value Ref Range Status   Specimen Description   Final    BLOOD RIGHT FOREARM Performed at [facility], [address], [place], [place] [address]    Special Requests   Final    BOTTLES DRAWN AEROBIC AND ANAEROBIC Blood Culture results may not be optimal due to an excessive volume of blood received in culture bottles Performed at [facility], [address] [place], [place] [address]    Culture   Final    NO GROWTH 3 DAYS Performed at [facility], [address] [place], [place] [address]    Report Status PENDING  Incomplete  Blood culture (routine x 2)     Status: None (Preliminary result)   Collection Time: 09/25/22  3:07 PM   Specimen: BLOOD RIGHT HAND  Result Value Ref Range Status   Specimen Description   Final    BLOOD RIGHT HAND Performed at [facility], [address], [place], [place] [address]    Special Requests   Final    BOTTLES DRAWN AEROBIC AND ANAEROBIC Blood Culture results may not be optimal due to an excessive volume of blood received in culture bottles Performed at [facility], [address], [place], [place] [address]    Culture   Final    NO GROWTH 3 DAYS Performed at [facility], [address], [place], [place]  [address]    Report Status PENDING  Incomplete  MRSA Next Gen by PCR, Nasal     Status: None   Collection Time: 09/25/22 10:41 PM   Specimen: Nasal Mucosa; Nasal Swab  Result Value Ref  Range Status   MRSA by PCR Next Gen NOT DETECTED NOT DETECTED Final    Comment: (NOTE) The GeneXpert MRSA Assay (FDA approved for NASAL specimens only), is one component of a comprehensive MRSA colonization surveillance program. It is not intended to diagnose MRSA infection nor to guide or monitor treatment for MRSA infections. Test performance is not FDA approved in patients less than 19 years old. Performed at [facility], [address] [place], [place] [address]    Creatinine: Recent Labs    09/25/22 1029 09/26/22 0525 09/27/22 0521 09/28/22 0510  CREATININE 0.69 0.66 0.64 0.66    Imaging: CT A/P shows likely blood products and bladder   Assessment/Plan:  Daily CBC  Renal function at baseline, WNL  Paused CBI midday.  Very light pink irrigant.  Reevaluate in the morning and go with voiding trial if everything looks good.  If hematuria is returned significantly will discuss cystoscopy with fulguration.  N.p.o. at midnight.  Continue Zytiga  Case and plan reviewed with Dr. Milton Ferguson Kemya Shed 09/28/2022, 2:16 PM  Pager: [phone]

## 2022-09-28 NOTE — Progress Notes (Signed)
Progress Note    Frank [name]   MWN:[id number]  DOB: [birth year]  DOA: 09/25/2022     3 PCP: Crissie Sickles, MD  Initial CC: hematuria  Hospital Course: Frank [name] is a 67 y.o. male with medical history significant of osteoarthritis, COPD/emphysema with chronic respiratory failure with hypoxia on home oxygen at 5 LPM via [place], nephrolithiasis, history of pneumonia, tobacco abuse, COVID-19, diverticulosis, colon polyps, prostate cancer metastatic to bone, urinary retention with indwelling Foley catheter, who underwent TURP on 09/08/2022 with Dr. Berneice Heinrich with pathology report showing prostatic adenocarcinoma.   Post procedure, he required further hospitalization 5 more days for decompensated respiratory failure.  He responded to breathing treatments, steroids, and aggressive pulmonary toileting. He returned to the hospital after developing hematuria.  He was also noted to be tachycardic and tachypneic in the ER. Urology was also consulted on admission.  He was initiated on CBI.  Interval History:  No events overnight.  Respiratory status much more comfortable this morning.  Resting in bed in no distress.  Urine also clear yellow noted in Foley.  CBI had been paused when seen.  Assessment and Plan:  Acute blood loss anemia (ABLA) Hematuria Prostate cancer metastatic to bone (HCC)  -Has been on CBI per urology; again paused today for trial - continue trenging Hgb and monitoring foley output -Continue Zytiga 1000 mg p.o. daily. -Urology is following - NPO at [facility] in case of need for procedure tomorrow    Acute on chronic respiratory failure with hypoxia and hypercapnia (HCC) COPD (chronic obstructive pulmonary disease) (HCC) Multifocal pneumonia  Continue supplemental oxygen. Scheduled and as needed bronchodilators. -cont daily prednisone - Continue ceftriaxone and azithro - Urine strep pneumo neg  Old records reviewed in assessment of this patient  Antimicrobials: Azithro 5/20 >>  current  Rocephin 5/20 >> current   DVT prophylaxis:  SCDs Start: 09/25/22 1540   Code Status:   Code Status: Full Code  Mobility Assessment (last 72 hours)     Mobility Assessment     Row Name 09/28/22 0834 09/27/22 2000 09/27/22 0918 09/26/22 2007 09/26/22 1606   Does patient have an order for bedrest or is patient medically unstable No - Continue assessment No - Continue assessment No - Continue assessment No - Continue assessment --   What is the highest level of mobility based on the progressive mobility assessment? Level 5 (Walks with assist in room/hall) - Balance while stepping forward/back and can walk in room with assist - Complete Level 3 (Stands with assist) - Balance while standing  and cannot march in place Level 4 (Walks with assist in room) - Balance while marching in place and cannot step forward and back - Complete Level 3 (Stands with assist) - Balance while standing  and cannot march in place Level 4 (Walks with assist in room) - Balance while marching in place and cannot step forward and back - Complete   Is the above level different from baseline mobility prior to current illness? No - Consider discontinuing PT/OT Yes - Recommend PT order Yes - Recommend PT order Yes - Recommend PT order --    Row Name 09/26/22 0720 09/25/22 2000 09/25/22 1645       Does patient have an order for bedrest or is patient medically unstable No - Continue assessment No - Continue assessment No - Continue assessment     What is the highest level of mobility based on the progressive mobility assessment? Level 4 (Walks with assist in room) -  Balance while marching in place and cannot step forward and back - Complete Level 3 (Stands with assist) - Balance while standing  and cannot march in place Level 4 (Walks with assist in room) - Balance while marching in place and cannot step forward and back - Complete     Is the above level different from baseline mobility prior to current illness? -- Yes -  Recommend PT order --              Barriers to discharge: none Disposition Plan:  Home Status is: Inpt  Objective: Blood pressure 133/64, pulse 95, temperature 97.7 F (36.5 C), temperature source Oral, resp. rate (!) 24, height 5\' 8"  (1.727 m), weight 68.1 kg, SpO2 95 %.  Examination:  Physical Exam Constitutional:      General: He is not in acute distress. HENT:     Head: Normocephalic and atraumatic.     Mouth/Throat:     Mouth: Mucous membranes are moist.  Eyes:     Extraocular Movements: Extraocular movements intact.  Cardiovascular:     Rate and Rhythm: Normal rate and regular rhythm.  Pulmonary:     Effort: Pulmonary effort is normal. No respiratory distress.  Abdominal:     General: Bowel sounds are normal. There is no distension.     Palpations: Abdomen is soft.     Tenderness: There is no abdominal tenderness.  Genitourinary:    Comments: No further hematuria appreciated in Foley basin Musculoskeletal:        General: Normal range of motion.     Cervical back: Normal range of motion and neck supple.  Skin:    General: Skin is warm.  Neurological:     General: No focal deficit present.     Mental Status: He is alert.  Psychiatric:        Mood and Affect: Mood normal.      Consultants:  Urology  Procedures:    Data Reviewed: Results for orders placed or performed during the hospital encounter of 09/25/22 (from the past 24 hour(s))  Basic metabolic panel     Status: Abnormal   Collection Time: 09/28/22  5:10 AM  Result Value Ref Range   Sodium 139 135 - 145 mmol/L   Potassium 3.5 3.5 - 5.1 mmol/L   Chloride 96 (L) 98 - 111 mmol/L   CO2 38 (H) 22 - 32 mmol/L   Glucose, Bld 106 (H) 70 - 99 mg/dL   BUN 18 8 - 23 mg/dL   Creatinine, Ser 4.09 0.61 - 1.24 mg/dL   Calcium 8.2 (L) 8.9 - 10.3 mg/dL   GFR, Estimated >81 >19 mL/min   Anion gap 5 5 - 15  CBC with Differential/Platelet     Status: Abnormal   Collection Time: 09/28/22  5:10 AM  Result  Value Ref Range   WBC 8.3 4.0 - 10.5 K/uL   RBC 2.60 (L) 4.22 - 5.81 MIL/uL   Hemoglobin 7.6 (L) 13.0 - 17.0 g/dL   HCT 14.7 (L) 82.9 - 56.2 %   MCV 95.0 80.0 - 100.0 fL   MCH 29.2 26.0 - 34.0 pg   MCHC 30.8 30.0 - 36.0 g/dL   RDW 13.0 86.5 - 78.4 %   Platelets 185 150 - 400 K/uL   nRBC 0.0 0.0 - 0.2 %   Neutrophils Relative % 71 %   Neutro Abs 5.9 1.7 - 7.7 K/uL   Lymphocytes Relative 17 %   Lymphs Abs 1.4 0.7 - 4.0 K/uL  Monocytes Relative 8 %   Monocytes Absolute 0.7 0.1 - 1.0 K/uL   Eosinophils Relative 2 %   Eosinophils Absolute 0.2 0.0 - 0.5 K/uL   Basophils Relative 1 %   Basophils Absolute 0.1 0.0 - 0.1 K/uL   Immature Granulocytes 1 %   Abs Immature Granulocytes 0.07 0.00 - 0.07 K/uL  Magnesium     Status: None   Collection Time: 09/28/22  5:10 AM  Result Value Ref Range   Magnesium 2.3 1.7 - 2.4 mg/dL    I have reviewed pertinent nursing notes, vitals, labs, and images as necessary. I have ordered labwork to follow up on as indicated.  I have reviewed the last notes from staff over past 24 hours. I have discussed patient's care plan and test results with nursing staff, CM/SW, and other staff as appropriate.  Time spent: Greater than 50% of the 55 minute visit was spent in counseling/coordination of care for the patient as laid out in the A&P.   LOS: 3 days   Lewie Chamber, MD [facility] 09/28/2022, 3:33 PM

## 2022-09-29 DIAGNOSIS — D62 Acute posthemorrhagic anemia: Secondary | ICD-10-CM | POA: Diagnosis not present

## 2022-09-29 DIAGNOSIS — J9621 Acute and chronic respiratory failure with hypoxia: Secondary | ICD-10-CM | POA: Diagnosis not present

## 2022-09-29 DIAGNOSIS — J189 Pneumonia, unspecified organism: Secondary | ICD-10-CM | POA: Diagnosis not present

## 2022-09-29 DIAGNOSIS — R310 Gross hematuria: Secondary | ICD-10-CM | POA: Diagnosis not present

## 2022-09-29 LAB — CBC WITH DIFFERENTIAL/PLATELET
Abs Immature Granulocytes: 0.06 10*3/uL (ref 0.00–0.07)
Basophils Absolute: 0.1 10*3/uL (ref 0.0–0.1)
Basophils Relative: 1 %
Eosinophils Absolute: 0.2 10*3/uL (ref 0.0–0.5)
Eosinophils Relative: 3 %
HCT: 25.6 % — ABNORMAL LOW (ref 39.0–52.0)
Hemoglobin: 7.9 g/dL — ABNORMAL LOW (ref 13.0–17.0)
Immature Granulocytes: 1 %
Lymphocytes Relative: 22 %
Lymphs Abs: 1.6 10*3/uL (ref 0.7–4.0)
MCH: 29.6 pg (ref 26.0–34.0)
MCHC: 30.9 g/dL (ref 30.0–36.0)
MCV: 95.9 fL (ref 80.0–100.0)
Monocytes Absolute: 0.7 10*3/uL (ref 0.1–1.0)
Monocytes Relative: 10 %
Neutro Abs: 4.6 10*3/uL (ref 1.7–7.7)
Neutrophils Relative %: 63 %
Platelets: 209 10*3/uL (ref 150–400)
RBC: 2.67 MIL/uL — ABNORMAL LOW (ref 4.22–5.81)
RDW: 13 % (ref 11.5–15.5)
WBC: 7.2 10*3/uL (ref 4.0–10.5)
nRBC: 0 % (ref 0.0–0.2)

## 2022-09-29 LAB — CULTURE, BLOOD (ROUTINE X 2): Culture: NO GROWTH

## 2022-09-29 LAB — BASIC METABOLIC PANEL
Anion gap: 4 — ABNORMAL LOW (ref 5–15)
BUN: 14 mg/dL (ref 8–23)
CO2: 41 mmol/L — ABNORMAL HIGH (ref 22–32)
Calcium: 8.5 mg/dL — ABNORMAL LOW (ref 8.9–10.3)
Chloride: 94 mmol/L — ABNORMAL LOW (ref 98–111)
Creatinine, Ser: 0.7 mg/dL (ref 0.61–1.24)
GFR, Estimated: >60 mL/min (ref >60)
Glucose, Bld: 102 mg/dL — ABNORMAL HIGH (ref 70–99)
Potassium: 3.9 mmol/L (ref 3.5–5.1)
Sodium: 139 mmol/L (ref 135–145)

## 2022-09-29 LAB — MAGNESIUM: Magnesium: 2.3 mg/dL (ref 1.7–2.4)

## 2022-09-29 NOTE — Progress Notes (Signed)
Physical Therapy Treatment Patient Details Name: Frank [name] MRN: [id number] DOB: 23-Mar-1956 Today's Date: 09/29/2022   History of Present Illness 67 y.o. male adm with ABLA, hematuria. 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 Berneice Heinrich- post op complication of decompensated respiratory status, stay prolonged ~5d;    PT Comments    Pt ambulated 39' with RW, SpO2 71% on 6L O2 walking, 3/4 dyspnea, after ~3 minutes seated rest SpO2 up to 90% on 6L. VCs for pursed lip breathing. No loss of balance with ambulation.    Recommendations for follow up therapy are one component of a multi-disciplinary discharge planning process, led by the attending physician.  Recommendations may be updated based on patient status, additional functional criteria and insurance authorization.  Follow Up Recommendations       Assistance Recommended at Discharge Intermittent Supervision/Assistance  Patient can return home with the following A little help with bathing/dressing/bathroom;Assistance with cooking/housework;Assist for transportation;Help with stairs or ramp for entrance   Equipment Recommendations  None recommended by PT    Recommendations for Other Services       Precautions / Restrictions Precautions Precautions: Fall Precaution Comments: monitor O2 Restrictions Weight Bearing Restrictions: No     Mobility  Bed Mobility Overal bed mobility: Modified Independent Bed Mobility: Supine to Sit     Supine to sit: HOB elevated, Modified independent (Device/Increase time)          Transfers Overall transfer level: Needs assistance Equipment used: Rolling walker (2 wheels) Transfers: Sit to/from Stand Sit to Stand:  Supervision           General transfer comment: supervision for safety and lines    Ambulation/Gait Ambulation/Gait assistance: Supervision Gait Distance (Feet): 90 Feet Assistive device: Rolling walker (2 wheels) Gait Pattern/deviations: Step-through pattern, Trunk flexed Gait velocity: decr     General Gait Details: cues for breathing and trunk extension as able; 3/4 DOE with SpO2=71% on 6L; pt requires significant recovery time ~ 3 minutes to recover sats to >90%;   Stairs             Wheelchair Mobility    Modified Rankin (Stroke Patients Only)       Balance   Sitting-balance support: No upper extremity supported, Feet supported Sitting balance-Leahy Scale: Good       Standing balance-Leahy Scale: Fair Standing balance comment: reliant on device for amb                            Cognition Arousal/Alertness: Awake/alert Behavior During Therapy: WFL for tasks assessed/performed, Flat affect Overall Cognitive Status: Within Functional Limits for tasks assessed                                          Exercises      General Comments        Pertinent Vitals/Pain Pain Assessment Pain Assessment: No/denies pain    Home Living                          Prior Function            PT Goals (current goals  can now be found in the care plan section) Acute Rehab PT Goals Patient Stated Goal: none stated PT Goal Formulation: With patient Time For Goal Achievement: 10/10/22 Potential to Achieve Goals: Good Progress towards PT goals: Goals met and updated - see care plan    Frequency    Min 1X/week      PT Plan Current plan remains appropriate    Co-evaluation              AM-PAC PT "6 Clicks" Mobility   Outcome Measure  Help needed turning from your back to your side while in a flat bed without using bedrails?: None Help needed moving from lying on your back to sitting on the side of a flat  bed without using bedrails?: A Little Help needed moving to and from a bed to a chair (including a wheelchair)?: A Little Help needed standing up from a chair using your arms (e.g., wheelchair or bedside chair)?: A Little Help needed to walk in hospital room?: A Little Help needed climbing 3-5 steps with a railing? : A Little 6 Click Score: 19    End of Session Equipment Utilized During Treatment: Gait belt;Oxygen Activity Tolerance: Patient limited by fatigue;Patient tolerated treatment well Patient left: with call bell/phone within reach;in chair Nurse Communication: Mobility status PT Visit Diagnosis: Other abnormalities of gait and mobility (R26.89);Difficulty in walking, not elsewhere classified (R26.2)     Time: 6681-5947 PT Time Calculation (min) (ACUTE ONLY): 16 min  Charges:  $Gait Training: 8-22 mins                     Ralene Bathe Kistler PT 09/29/2022  Acute Rehabilitation Services  Office [phone]

## 2022-09-29 NOTE — Plan of Care (Signed)
  Problem: Education: Goal: Knowledge of the prescribed therapeutic regimen will improve Outcome: Progressing   Problem: Bowel/Gastric: Goal: Gastrointestinal status for postoperative course will improve Outcome: Progressing   Problem: Health Behavior/Discharge Planning: Goal: Identification of resources available to assist in meeting health care needs will improve Outcome: Progressing   Problem: Skin Integrity: Goal: Demonstration of wound healing without infection will improve Outcome: Progressing

## 2022-09-29 NOTE — Progress Notes (Signed)
Assumed care. Agree with assessment from previous RN. No complaints made.

## 2022-09-29 NOTE — Plan of Care (Signed)
  Problem: Coping: Goal: Level of anxiety will decrease Outcome: Progressing   Problem: Elimination: Goal: Will not experience complications related to urinary retention Outcome: Progressing   Problem: Coping: Goal: Level of anxiety will decrease Outcome: Progressing   Problem: Elimination: Goal: Will not experience complications related to urinary retention Outcome: Progressing

## 2022-09-29 NOTE — Progress Notes (Addendum)
Urology Progress Note  History of Present Illness: Frank [name] is a 67 year old male known to our practice.  He was recently discharged on 09/13/2022 from [facility] following a transurethral prostate resection with Dr. Berneice Heinrich, and a protracted stay due to his significantly diminished lung function.  Patient reports that he had not had any difficulty with hematuria up until yesterday morning.  Unclear if Foley was pulled traumatically and unable to determine if an infectious source was present.  On my arrival patient was alert, oriented, and in no distress.  He was not uncomfortable and amenable to the plan listed below.  5/24: No acute events overnight.  CBI over night with no hematuria on rounds.  Past Medical History:  Diagnosis Date   Arthritis    Cancer (HCC)    COPD (chronic obstructive pulmonary disease) (HCC) 06/05/2012   COVID-19 02/28/2020   Diverticulosis of colon 06/09/2016   Dyspnea    Emphysema 06/07/2012   Heart murmur    hx of as baby   History of colonic polyps 06/17/2014   History of kidney stones    Hypoxemia 06/07/2012   Obstructive chronic bronchitis with exacerbation (HCC) 06/07/2012   Pneumonia    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   Tobacco abuse 06/05/2012   Past Surgical History:  Procedure Laterality Date   CATARACT EXTRACTION, BILATERAL     CYSTOSCOPY WITH LITHOLAPAXY N/A 01/13/2022   Procedure: CYSTOSCOPY WITH LITHOLAPAXY;  Surgeon: Sebastian Ache, MD;  Location: [facility];  Service: Urology;  Laterality: N/A;   CYSTOSCOPY/RETROGRADE/URETEROSCOPY/STONE EXTRACTION WITH BASKET Right 10/04/2016   Procedure: CYSTOSCOPY/RETROGRADE/URETEROSCOPY/STONE EXTRACTION WITH BASKET/ STENT;  Surgeon: Sebastian Ache, MD;  Location: [facility];  Service: Urology;  Laterality: Right;  With LASER   CYSTOSCOPY/URETEROSCOPY/HOLMIUM LASER/STENT PLACEMENT Right 01/13/2022   Procedure: CYSTOSCOPY/URETEROSCOPY/HOLMIUM LASER/STENT PLACEMENT;  Surgeon: Sebastian Ache, MD;  Location: [facility];  Service: Urology;  Laterality: Right;   EXTRACORPOREAL SHOCK WAVE LITHOTRIPSY Right 06/29/2016   Procedure: RIGHT EXTRACORPOREAL SHOCK WAVE LITHOTRIPSY (ESWL);  Surgeon: Crist Fat, MD;  Location: [facility];  Service: Urology;  Laterality: Right;   TRANSURETHRAL RESECTION OF PROSTATE N/A 09/08/2022   Procedure: TRANSURETHRAL RESECTION OF THE PROSTATE (TURP);  Surgeon: Loletta Parish., MD;  Location: [facility];  Service: Urology;  Laterality: N/A;  75 MINS    Medications: I have reviewed the patient's current medications. Allergies: No Known Allergies  Family History  Problem Relation Age of Onset   COPD Mother    Colon polyps Father    Heart attack Father 76   Colon polyps Sister    Social History:  reports that he quit smoking about 7 years ago. His smoking use included cigarettes. He has a 17.50 pack-year smoking history. He quit smokeless tobacco use about 54 years ago.  His smokeless tobacco use included chew. He reports that he does not drink alcohol and does not use drugs.  ROS: All systems are reviewed and negative except as noted. Hematuria  Physical Exam:  Vital signs in last 24 hours: Temp:  [97.7 F (36.5 C)-98.4 F (36.9 C)] 98.1 F (36.7 C) (05/24 0505) Pulse Rate:  [80-95] 80 (05/24 0505) Resp:  [20-24] 24 (05/24 0505) BP: (114-133)/(64-71) 114/65 (05/24 0505) SpO2:  [92 %-99 %] 99 % (05/24 0505)  Cardiovascular: Skin warm; not flushed Respiratory: Breaths quiet; no shortness of breath Abdomen: No masses, soft, no guarding or rebound Neurological: Normal sensation to touch Musculoskeletal: Normal motor function arms and legs Skin: No rashes Genitourinary:  Three-way 20 F hematuria catheter in place draining light pink urine on slow gtt.  Laboratory Data:  Results for orders placed or performed during the hospital encounter of 09/25/22 (from the past 72 hour(s))  Hemoglobin and hematocrit, blood     Status: Abnormal    Collection Time: 09/26/22 12:20 PM  Result Value Ref Range   Hemoglobin 8.4 (L) 13.0 - 17.0 g/dL   HCT 16.1 (L) 09.6 - 04.5 %    Comment: Performed at [facility], [address], [place], [place] [address]  Comprehensive metabolic panel     Status: Abnormal   Collection Time: 09/27/22  5:21 AM  Result Value Ref Range   Sodium 142 135 - 145 mmol/L   Potassium 3.7 3.5 - 5.1 mmol/L   Chloride 99 98 - 111 mmol/L   CO2 38 (H) 22 - 32 mmol/L   Glucose, Bld 112 (H) 70 - 99 mg/dL    Comment: Glucose reference range applies only to samples taken after fasting for at least 8 hours.   BUN 17 8 - 23 mg/dL   Creatinine, Ser 1.91 0.61 - 1.24 mg/dL   Calcium 8.2 (L) 8.9 - 10.3 mg/dL   Total Protein 5.6 (L) 6.5 - 8.1 g/dL   Albumin 2.3 (L) 3.5 - 5.0 g/dL   AST 13 (L) 15 - 41 U/L   ALT 13 0 - 44 U/L   Alkaline Phosphatase 56 38 - 126 U/L   Total Bilirubin 0.1 (L) 0.3 - 1.2 mg/dL   GFR, Estimated >47 >82 mL/min    Comment: (NOTE) Calculated using the CKD-EPI Creatinine Equation (2021)    Anion gap 5 5 - 15    Comment: Performed at [facility], [address], [place], [place] [address]  CBC     Status: Abnormal   Collection Time: 09/27/22  5:21 AM  Result Value Ref Range   WBC 9.7 4.0 - 10.5 K/uL   RBC 2.64 (L) 4.22 - 5.81 MIL/uL   Hemoglobin 7.6 (L) 13.0 - 17.0 g/dL   HCT 30.8 (L) 65.7 - 84.6 %   MCV 95.5 80.0 - 100.0 fL   MCH 28.8 26.0 - 34.0 pg   MCHC 30.2 30.0 - 36.0 g/dL   RDW 96.2 95.2 - 84.1 %   Platelets 161 150 - 400 K/uL   nRBC 0.0 0.0 - 0.2 %    Comment: Performed at [facility], [address], [place], [place] [address]  Basic metabolic panel     Status: Abnormal   Collection Time: 09/28/22  5:10 AM  Result Value Ref Range   Sodium 139 135 - 145 mmol/L   Potassium 3.5 3.5 - 5.1 mmol/L   Chloride 96 (L) 98 - 111 mmol/L   CO2 38 (H) 22 - 32 mmol/L   Glucose, Bld 106 (H) 70 - 99 mg/dL    Comment: Glucose reference  range applies only to samples taken after fasting for at least 8 hours.   BUN 18 8 - 23 mg/dL   Creatinine, Ser 1.02 0.61 - 1.24 mg/dL   Calcium 8.2 (L) 8.9 - 10.3 mg/dL   GFR, Estimated >72 >53 mL/min    Comment: (NOTE) Calculated using the CKD-EPI Creatinine Equation (2021)    Anion gap 5 5 - 15    Comment: Performed at [facility], [address] [place], [place] [address]  CBC with Differential/Platelet     Status: Abnormal   Collection Time: 09/28/22  5:10 AM  Result Value Ref Range   WBC 8.3 4.0 - 10.5 K/uL   RBC 2.60 (L) 4.22 - 5.81 MIL/uL   Hemoglobin 7.6 (L) 13.0 - 17.0 g/dL   HCT 40.9 (L) 81.1 - 91.4 %   MCV 95.0 80.0 - 100.0 fL   MCH 29.2 26.0 - 34.0 pg   MCHC 30.8 30.0 - 36.0 g/dL   RDW 78.2 95.6 - 21.3 %   Platelets 185 150 - 400 K/uL   nRBC 0.0 0.0 - 0.2 %   Neutrophils Relative % 71 %   Neutro Abs 5.9 1.7 - 7.7 K/uL   Lymphocytes Relative 17 %   Lymphs Abs 1.4 0.7 - 4.0 K/uL   Monocytes Relative 8 %   Monocytes Absolute 0.7 0.1 - 1.0 K/uL   Eosinophils Relative 2 %   Eosinophils Absolute 0.2 0.0 - 0.5 K/uL   Basophils Relative 1 %   Basophils Absolute 0.1 0.0 - 0.1 K/uL   Immature Granulocytes 1 %   Abs Immature Granulocytes 0.07 0.00 - 0.07 K/uL    Comment: Performed at [facility], [address] [place], [place] [address]  Magnesium     Status: None   Collection Time: 09/28/22  5:10 AM  Result Value Ref Range   Magnesium 2.3 1.7 - 2.4 mg/dL    Comment: Performed at [facility], [address], [place], [place] [address]  Basic metabolic panel     Status: Abnormal   Collection Time: 09/29/22  5:08 AM  Result Value Ref Range   Sodium 139 135 - 145 mmol/L   Potassium 3.9 3.5 - 5.1 mmol/L   Chloride 94 (L) 98 - 111 mmol/L   CO2 41 (H) 22 - 32 mmol/L   Glucose, Bld 102 (H) 70 - 99 mg/dL    Comment: Glucose reference range applies only to samples taken after fasting for at least 8 hours.   BUN  14 8 - 23 mg/dL   Creatinine, Ser 2.95 0.61 - 1.24 mg/dL   Calcium 8.5 (L) 8.9 - 10.3 mg/dL   GFR, Estimated >28 >41 mL/min    Comment: (NOTE) Calculated using the CKD-EPI Creatinine Equation (2021)    Anion gap 4 (L) 5 - 15    Comment: Performed at [facility], [address], [place], [place] [address]  CBC with Differential/Platelet     Status: Abnormal   Collection Time: 09/29/22  5:08 AM  Result Value Ref Range   WBC 7.2 4.0 - 10.5 K/uL   RBC 2.67 (L) 4.22 - 5.81 MIL/uL   Hemoglobin 7.9 (L) 13.0 - 17.0 g/dL   HCT 10.2 (L) 72.5 - 36.6 %   MCV 95.9 80.0 - 100.0 fL   MCH 29.6 26.0 - 34.0 pg   MCHC 30.9 30.0 - 36.0 g/dL   RDW 44.0 34.7 - 42.5 %   Platelets 209 150 - 400 K/uL   nRBC 0.0 0.0 - 0.2 %   Neutrophils Relative % 63 %   Neutro Abs 4.6 1.7 - 7.7 K/uL   Lymphocytes Relative 22 %   Lymphs Abs 1.6 0.7 - 4.0 K/uL   Monocytes Relative 10 %   Monocytes Absolute 0.7 0.1 - 1.0 K/uL   Eosinophils Relative 3 %   Eosinophils Absolute 0.2 0.0 - 0.5 K/uL   Basophils Relative 1 %   Basophils Absolute 0.1 0.0 - 0.1 K/uL   Immature Granulocytes 1 %   Abs Immature Granulocytes 0.06 0.00 -  0.07 K/uL    Comment: Performed at [facility], [address] [place], [place] [address]  Magnesium     Status: None   Collection Time: 09/29/22  5:08 AM  Result Value Ref Range   Magnesium 2.3 1.7 - 2.4 mg/dL    Comment: Performed at [facility], [address], [place], [place] [address]   Recent Results (from the past 240 hour(s))  Blood culture (routine x 2)     Status: None (Preliminary result)   Collection Time: 09/25/22  2:55 PM   Specimen: BLOOD RIGHT FOREARM  Result Value Ref Range Status   Specimen Description   Final    BLOOD RIGHT FOREARM Performed at [facility], [address], [place], [place] [address]    Special Requests   Final    BOTTLES DRAWN AEROBIC AND ANAEROBIC Blood Culture results may  not be optimal due to an excessive volume of blood received in culture bottles Performed at [facility], [address] [place], [place] [address]    Culture   Final    NO GROWTH 4 DAYS Performed at [facility], [address], [place], [place] [address]    Report Status PENDING  Incomplete  Blood culture (routine x 2)     Status: None (Preliminary result)   Collection Time: 09/25/22  3:07 PM   Specimen: BLOOD RIGHT HAND  Result Value Ref Range Status   Specimen Description   Final    BLOOD RIGHT HAND Performed at [facility], [address] [place], [place] [address]    Special Requests   Final    BOTTLES DRAWN AEROBIC AND ANAEROBIC Blood Culture results may not be optimal due to an excessive volume of blood received in culture bottles Performed at [facility], [address] [place], [place] [address]    Culture   Final    NO GROWTH 4 DAYS Performed at [facility], [address] [place], [place] [address]    Report Status PENDING  Incomplete  MRSA Next Gen by PCR, Nasal     Status: None   Collection Time: 09/25/22 10:41 PM   Specimen: Nasal Mucosa; Nasal Swab  Result Value Ref Range Status   MRSA by PCR Next Gen NOT DETECTED NOT DETECTED Final    Comment: (NOTE) The GeneXpert MRSA Assay (FDA approved for NASAL specimens only), is one component of a comprehensive MRSA colonization surveillance program. It is not intended to diagnose MRSA infection nor to guide or monitor treatment for MRSA infections. Test performance is not FDA approved in patients less than 73 years old. Performed at [facility], [address], [place], [place] [address]    Creatinine: Recent Labs    09/25/22 1029 09/26/22 0525 09/27/22 0521 09/28/22 0510 09/29/22 0508  CREATININE 0.69 0.66 0.64 0.66 0.70    Imaging: CT A/P shows likely blood products and bladder   Assessment/Plan:  Daily  CBC  Renal function at baseline, WNL  CBI paused yesterday afternoon. Clear yellow urine on rounds this am. No need for surgical intervention. Diet provided. Voiding trial this am.   Continue Zytiga  Case and plan reviewed with Dr. Milton Ferguson [place] 09/29/2022, 8:22 AM  Pager: [phone]   I have seen and examined the patient and agree with Cameron's plan.  Urine cyrstial clear, I removed his catheter for voidgnin trial.   I feel pt acceptable to  DC hoem as soon as tomorrow from [employer]. Will replace catheter only if fails voiding trial. He has F/U with [place] arranged and family very facile with his care.  Sincerely appreciate Dr. Frederick Peers' comanagment especially considering his tenuous pulmonary status.

## 2022-09-29 NOTE — Progress Notes (Signed)
Progress Note    Frank [name]   ZOX:[id number]  DOB: 12-30-1955  DOA: 09/25/2022     4 PCP: Crissie Sickles, MD  Initial CC: hematuria  Hospital Course: Frank [name] is a 67 y.o. male with medical history significant of osteoarthritis, COPD/emphysema with chronic respiratory failure with hypoxia on home oxygen at 5 LPM via [place], nephrolithiasis, history of pneumonia, tobacco abuse, COVID-19, diverticulosis, colon polyps, prostate cancer metastatic to bone, urinary retention with indwelling Foley catheter, who underwent TURP on 09/08/2022 with Dr. Berneice Heinrich with pathology report showing prostatic adenocarcinoma.   Post procedure, he required further hospitalization 5 more days for decompensated respiratory failure.  He responded to breathing treatments, steroids, and aggressive pulmonary toileting. He returned to the hospital after developing hematuria.  He was also noted to be tachycardic and tachypneic in the ER. Urology was also consulted on admission.  He was initiated on CBI.  Interval History:  No events overnight.  Seems to continue to improve slowly.  Urine remains clear yellow in Foley bag this morning.  Respiratory status also stable. After he was seen by urology this morning, he was placed on a voiding trial and catheter has been removed.  Assessment and Plan:  Acute blood loss anemia (ABLA) Hematuria Prostate cancer metastatic to bone [facility])  -Has been on CBI per urology -Hematuria has resolved and urine has remained clear yellow -Continue Zytiga 1000 mg p.o. daily. -He is being started on a voiding trial today.  Foley removed per urology on 09/29/2022  Acute on chronic respiratory failure with hypoxia and hypercapnia (HCC) COPD (chronic obstructive pulmonary disease) (HCC) Multifocal pneumonia  Continue supplemental oxygen. Scheduled and as needed bronchodilators. -cont daily prednisone - Continue ceftriaxone and azithro - Urine strep pneumo neg  Old records reviewed in  assessment of this patient  Antimicrobials: Azithro 5/20 >> current  Rocephin 5/20 >> current   DVT prophylaxis:  SCDs Start: 09/25/22 1540   Code Status:   Code Status: Full Code  Mobility Assessment (last 72 hours)     Mobility Assessment     Row Name 09/29/22 0845 09/28/22 1942 09/28/22 0834 09/27/22 2000 09/27/22 0918   Does patient have an order for bedrest or is patient medically unstable No - Continue assessment Yes- Bedfast (Level 1) - Complete No - Continue assessment No - Continue assessment No - Continue assessment   What is the highest level of mobility based on the progressive mobility assessment? Level 5 (Walks with assist in room/hall) - Balance while stepping forward/back and can walk in room with assist - Complete Level 5 (Walks with assist in room/hall) - Balance while stepping forward/back and can walk in room with assist - Complete Level 5 (Walks with assist in room/hall) - Balance while stepping forward/back and can walk in room with assist - Complete Level 3 (Stands with assist) - Balance while standing  and cannot march in place Level 4 (Walks with assist in room) - Balance while marching in place and cannot step forward and back - Complete   Is the above level different from baseline mobility prior to current illness? Yes - Recommend PT order -- No - Consider discontinuing PT/OT Yes - Recommend PT order Yes - Recommend PT order    Row Name 09/26/22 2007 09/26/22 1606         Does patient have an order for bedrest or is patient medically unstable No - Continue assessment --      What is the highest level of mobility based  on the progressive mobility assessment? Level 3 (Stands with assist) - Balance while standing  and cannot march in place Level 4 (Walks with assist in room) - Balance while marching in place and cannot step forward and back - Complete      Is the above level different from baseline mobility prior to current illness? Yes - Recommend PT order --                Barriers to discharge: none Disposition Plan:  Home Status is: Inpt  Objective: Blood pressure 128/68, pulse 97, temperature 99 F (37.2 C), temperature source Oral, resp. rate (!) 24, height 5\' 8"  (1.727 m), weight 68.1 kg, SpO2 90 %.  Examination:  Physical Exam Constitutional:      General: He is not in acute distress. HENT:     Head: Normocephalic and atraumatic.     Mouth/Throat:     Mouth: Mucous membranes are moist.  Eyes:     Extraocular Movements: Extraocular movements intact.  Cardiovascular:     Rate and Rhythm: Normal rate and regular rhythm.  Pulmonary:     Effort: Pulmonary effort is normal. No respiratory distress.  Abdominal:     General: Bowel sounds are normal. There is no distension.     Palpations: Abdomen is soft.     Tenderness: There is no abdominal tenderness.  Genitourinary:    Comments: No further hematuria appreciated in Foley basin Musculoskeletal:        General: Normal range of motion.     Cervical back: Normal range of motion and neck supple.  Skin:    General: Skin is warm.  Neurological:     General: No focal deficit present.     Mental Status: He is alert.  Psychiatric:        Mood and Affect: Mood normal.      Consultants:  Urology  Procedures:    Data Reviewed: Results for orders placed or performed during the hospital encounter of 09/25/22 (from the past 24 hour(s))  Basic metabolic panel     Status: Abnormal   Collection Time: 09/29/22  5:08 AM  Result Value Ref Range   Sodium 139 135 - 145 mmol/L   Potassium 3.9 3.5 - 5.1 mmol/L   Chloride 94 (L) 98 - 111 mmol/L   CO2 41 (H) 22 - 32 mmol/L   Glucose, Bld 102 (H) 70 - 99 mg/dL   BUN 14 8 - 23 mg/dL   Creatinine, Ser 1.61 0.61 - 1.24 mg/dL   Calcium 8.5 (L) 8.9 - 10.3 mg/dL   GFR, Estimated >09 >60 mL/min   Anion gap 4 (L) 5 - 15  CBC with Differential/Platelet     Status: Abnormal   Collection Time: 09/29/22  5:08 AM  Result Value Ref Range   WBC  7.2 4.0 - 10.5 K/uL   RBC 2.67 (L) 4.22 - 5.81 MIL/uL   Hemoglobin 7.9 (L) 13.0 - 17.0 g/dL   HCT 45.4 (L) 09.8 - 11.9 %   MCV 95.9 80.0 - 100.0 fL   MCH 29.6 26.0 - 34.0 pg   MCHC 30.9 30.0 - 36.0 g/dL   RDW 14.7 82.9 - 56.2 %   Platelets 209 150 - 400 K/uL   nRBC 0.0 0.0 - 0.2 %   Neutrophils Relative % 63 %   Neutro Abs 4.6 1.7 - 7.7 K/uL   Lymphocytes Relative 22 %   Lymphs Abs 1.6 0.7 - 4.0 K/uL   Monocytes Relative 10 %  Monocytes Absolute 0.7 0.1 - 1.0 K/uL   Eosinophils Relative 3 %   Eosinophils Absolute 0.2 0.0 - 0.5 K/uL   Basophils Relative 1 %   Basophils Absolute 0.1 0.0 - 0.1 K/uL   Immature Granulocytes 1 %   Abs Immature Granulocytes 0.06 0.00 - 0.07 K/uL  Magnesium     Status: None   Collection Time: 09/29/22  5:08 AM  Result Value Ref Range   Magnesium 2.3 1.7 - 2.4 mg/dL    I have reviewed pertinent nursing notes, vitals, labs, and images as necessary. I have ordered labwork to follow up on as indicated.  I have reviewed the last notes from staff over past 24 hours. I have discussed patient's care plan and test results with nursing staff, CM/SW, and other staff as appropriate.  Time spent: Greater than 50% of the 55 minute visit was spent in counseling/coordination of care for the patient as laid out in the A&P.   LOS: 4 days   Lewie Chamber, MD [facility] 09/29/2022, 2:49 PM

## 2022-09-30 DIAGNOSIS — J9621 Acute and chronic respiratory failure with hypoxia: Secondary | ICD-10-CM | POA: Diagnosis not present

## 2022-09-30 DIAGNOSIS — R310 Gross hematuria: Secondary | ICD-10-CM | POA: Diagnosis not present

## 2022-09-30 DIAGNOSIS — J449 Chronic obstructive pulmonary disease, unspecified: Secondary | ICD-10-CM | POA: Diagnosis not present

## 2022-09-30 DIAGNOSIS — D62 Acute posthemorrhagic anemia: Secondary | ICD-10-CM | POA: Diagnosis not present

## 2022-09-30 LAB — CULTURE, BLOOD (ROUTINE X 2)

## 2022-09-30 MED ORDER — PREDNISONE 10 MG PO TABS: 10.0000 mg | ORAL_TABLET | Freq: Every day | ORAL | Status: DC

## 2022-09-30 NOTE — Progress Notes (Signed)
PT demonstrated hands on understanding of Flutter device- NPC at this time. 

## 2022-09-30 NOTE — Plan of Care (Signed)
  Problem: Clinical Measurements: Goal: Will remain free from infection Outcome: Progressing Goal: Respiratory complications will improve Outcome: Progressing   Problem: Coping: Goal: Level of anxiety will decrease Outcome: Progressing   

## 2022-09-30 NOTE — Discharge Summary (Signed)
Physician Discharge Summary   Frank [name] EXB:[id number] DOB: [birth year] DOA: 09/25/2022  PCP: Crissie Sickles, MD  Admit date: 09/25/2022 Discharge date: 09/30/2022   Admitted From: Home Disposition:  Home Discharging physician: Lewie Chamber, MD Barriers to discharge: none  Recommendations at discharge: Follow up with urology  Discharge Condition: stable CODE STATUS: Full Diet recommendation:  Diet Orders (From admission, onward)     Start     Ordered   09/30/22 0000  Diet general        09/30/22 1047   09/29/22 0819  Diet regular Room service appropriate? Yes; Fluid consistency: Thin  Diet effective now       Question Answer Comment  Room service appropriate? Yes   Fluid consistency: Thin      09/29/22 0821            Hospital Course: Frank [name] is a 67 y.o. 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 Berneice Heinrich with pathology report showing prostatic adenocarcinoma.   Post procedure, he required further hospitalization 5 more days for decompensated respiratory failure.  He responded to breathing treatments, steroids, and aggressive pulmonary toileting. He returned to the hospital after developing hematuria.  He was also noted to be tachycardic and tachypneic in the ER. Urology was also consulted on admission.  He was initiated on CBI.  His hematuria resolved and he also underwent a voiding trial on 09/29/2022.  He did well on voiding trial and was able to be discharged home without further need for Foley.  He will follow-up outpatient with urology also.  Assessment and Plan:  Acute blood loss anemia (ABLA) Hematuria Prostate cancer metastatic to bone  [facility])  -Has been on CBI per urology -Hematuria has resolved and urine has remained clear yellow -Continue Zytiga 1000 mg p.o. daily. -Passed voiding trial which was performed on 09/29/2022 - Outpatient follow-up with urology   Acute on chronic respiratory failure with hypoxia and hypercapnia (HCC) COPD (chronic obstructive pulmonary disease) (HCC) Multifocal pneumonia  Continue supplemental oxygen. Scheduled and as needed bronchodilators. -cont daily prednisone (chronic) - completed abx inpatient   The patient's chronic medical conditions were treated accordingly per the patient's home medication regimen except as noted.  On day of discharge, patient was felt deemed stable for discharge. Patient/family member advised to call PCP or come back to ER if needed.   Principal Diagnosis: Hematuria  Discharge Diagnoses: Active Hospital Problems   Diagnosis Date Noted   Hematuria 09/25/2022   Acute blood loss anemia (ABLA) 09/25/2022   Multifocal pneumonia 09/25/2022   Acute on chronic respiratory failure with hypoxia and hypercapnia (HCC) 08/05/2022   COPD (chronic obstructive pulmonary disease) (HCC) 11/27/2016   Prostate cancer metastatic to bone [facility]) 06/12/2014    Resolved Hospital Problems  No resolved problems to display.     Discharge Instructions     Amb Referral to Palliative Care   Complete by: As directed    Diet general   Complete by: As directed    Increase activity slowly   Complete by: As directed       Allergies as of 09/30/2022   No Known Allergies      Medication List     STOP taking these medications    guaiFENesin 600 MG  12 hr tablet Commonly known as: MUCINEX   traMADol 50 MG tablet Commonly known as: Ultram       TAKE these medications    abiraterone acetate 250 MG tablet Commonly known as: ZYTIGA Take 1,000 mg by mouth daily.   albuterol 108 (90 Base) MCG/ACT inhaler Commonly known as: Ventolin HFA INHALE 2 PUFFS BY MOUTH EVERY 4  HOURS AS NEEDED FOR WHEEZING OR SHORTNESS OF BREATH What changed:  how much to take how to take this when to take this reasons to take this additional instructions   albuterol 0.63 MG/3ML nebulizer solution Commonly known as: ACCUNEB USE 1 VIAL VIA NEBULIZER EVERY 6 HOURS What changed: See the new instructions.   fluticasone 50 MCG/ACT nasal spray Commonly known as: FLONASE Place 1 spray into both nostrils daily. What changed: when to take this   ipratropium-albuterol 0.5-2.5 (3) MG/3ML Soln Commonly known as: DUONEB Take 3 mLs by nebulization every 6 (six) hours. What changed: additional instructions   loratadine 10 MG tablet Commonly known as: CLARITIN Take 1 tablet (10 mg total) by mouth daily.   OXYGEN Inhale 4-5 L/min into the lungs continuous.   OYSTER SHELL CALCIUM/D3 PO Take 1 tablet by mouth daily with breakfast. 600 mg   predniSONE 10 MG tablet Commonly known as: DELTASONE Take 1 tablet (10 mg total) by mouth daily with breakfast. Start taking on: Oct 01, 2022 What changed:  how much to take Another medication with the same name was removed. Continue taking this medication, and follow the directions you see here.   Trelegy Ellipta 100-62.5-25 MCG/ACT Aepb Generic drug: Fluticasone-Umeclidin-Vilant INHALE 1 PUFF INTO THE LUNGS DAILY What changed: how to take this        Follow-up Information     [facility], [facility] Follow up.   Specialty: Home Health Services Why: [facility] nursing/physical therapy/occupational therapy Contact information: [address] [place] [address] [phone]                No Known Allergies  Consultations: Urology  Procedures:   Discharge Exam: BP 112/66 (BP Location: Left Arm)   Pulse 83   Temp (!) 97.5 F (36.4 C) (Oral)   Resp 17   Ht 5\' 8"  (1.727 m) Comment: Per Pt  Wt 68.1 kg   SpO2 94%   BMI 22.83 kg/m  Physical Exam Constitutional:      General: He is not in acute  distress. HENT:     Head: Normocephalic and atraumatic.     Mouth/Throat:     Mouth: Mucous membranes are moist.  Eyes:     Extraocular Movements: Extraocular movements intact.  Cardiovascular:     Rate and Rhythm: Normal rate and regular rhythm.  Pulmonary:     Effort: Pulmonary effort is normal. No respiratory distress.  Abdominal:     General: Bowel sounds are normal. There is no distension.     Palpations: Abdomen is soft.     Tenderness: There is no abdominal tenderness.  Musculoskeletal:        General: Normal range of motion.     Cervical back: Normal range of motion and neck supple.  Skin:    General: Skin is warm.  Neurological:     General: No focal deficit present.     Mental Status: He is alert.  Psychiatric:        Mood and Affect: Mood normal.      The results of significant diagnostics from this hospitalization (including imaging,  microbiology, ancillary and laboratory) are listed below for reference.   Microbiology: Recent Results (from the past 240 hour(s))  Blood culture (routine x 2)     Status: None   Collection Time: 09/25/22  2:55 PM   Specimen: BLOOD RIGHT FOREARM  Result Value Ref Range Status   Specimen Description   Final    BLOOD RIGHT FOREARM Performed at [facility], [address], [place], [place] [address]    Special Requests   Final    BOTTLES DRAWN AEROBIC AND ANAEROBIC Blood Culture results may not be optimal due to an excessive volume of blood received in culture bottles Performed at [facility], [address], [place], [place] [address]    Culture   Final    NO GROWTH 5 DAYS Performed at [facility], [address] [place], [place] [address]    Report Status 09/30/2022 FINAL  Final  Blood culture (routine x 2)     Status: None   Collection Time: 09/25/22  3:07 PM   Specimen: BLOOD RIGHT HAND  Result Value Ref Range Status   Specimen Description   Final    BLOOD RIGHT  HAND Performed at [facility], [address] [place], [place] [address]    Special Requests   Final    BOTTLES DRAWN AEROBIC AND ANAEROBIC Blood Culture results may not be optimal due to an excessive volume of blood received in culture bottles Performed at [facility], [address], [place], [place] [address]    Culture   Final    NO GROWTH 5 DAYS Performed at [facility], [address] [place], [place] [address]    Report Status 09/30/2022 FINAL  Final  MRSA Next Gen by PCR, Nasal     Status: None   Collection Time: 09/25/22 10:41 PM   Specimen: Nasal Mucosa; Nasal Swab  Result Value Ref Range Status   MRSA by PCR Next Gen NOT DETECTED NOT DETECTED Final    Comment: (NOTE) The GeneXpert MRSA Assay (FDA approved for NASAL specimens only), is one component of a comprehensive MRSA colonization surveillance program. It is not intended to diagnose MRSA infection nor to guide or monitor treatment for MRSA infections. Test performance is not FDA approved in patients less than 35 years old. Performed at [facility], [address], [place], [place] [address]      Labs: BNP (last 3 results) Recent Labs    08/04/22 1353 08/05/22 1547  BNP 49.0 153.0*   Basic Metabolic Panel: Recent Labs  Lab 09/25/22 1029 09/26/22 0525 09/27/22 0521 09/28/22 0510 09/29/22 0508  NA 137 136 142 139 139  K 3.8 3.7 3.7 3.5 3.9  CL 91* 95* 99 96* 94*  CO2 37* 36* 38* 38* 41*  GLUCOSE 149* 99 112* 106* 102*  BUN 26* 20 17 18 14   CREATININE 0.69 0.66 0.64 0.66 0.70  CALCIUM 9.1 8.1* 8.2* 8.2* 8.5*  MG  --   --   --  2.3 2.3   Liver Function Tests: Recent Labs  Lab 09/25/22 1029 09/26/22 0525 09/27/22 0521  AST 17 14* 13*  ALT 15 12 13   ALKPHOS 78 56 56  BILITOT 0.6 0.9 0.1*  PROT 6.9 5.3* 5.6*  ALBUMIN 3.2* 2.4* 2.3*   Recent Labs  Lab 09/25/22 1029  LIPASE 35   No results for input(s): "AMMONIA" in the  last 168 hours. CBC: Recent Labs  Lab  09/25/22 1029 09/25/22 1911 09/26/22 0525 09/26/22 1220 09/27/22 0521 09/28/22 0510 09/29/22 0508  WBC 19.7*  --  14.7*  --  9.7 8.3 7.2  NEUTROABS 15.8*  --   --   --   --  5.9 4.6  HGB 11.2*   < > 8.0* 8.4* 7.6* 7.6* 7.9*  HCT 37.1*   < > 26.4* 27.2* 25.2* 24.7* 25.6*  MCV 94.9  --  95.0  --  95.5 95.0 95.9  PLT 242  --  174  --  161 185 209   < > = values in this interval not displayed.   Cardiac Enzymes: No results for input(s): "CKTOTAL", "CKMB", "CKMBINDEX", "TROPONINI" in the last 168 hours. BNP: Invalid input(s): "POCBNP" CBG: No results for input(s): "GLUCAP" in the last 168 hours. D-Dimer No results for input(s): "DDIMER" in the last 72 hours. Hgb A1c No results for input(s): "HGBA1C" in the last 72 hours. Lipid Profile No results for input(s): "CHOL", "HDL", "LDLCALC", "TRIG", "CHOLHDL", "LDLDIRECT" in the last 72 hours. Thyroid function studies No results for input(s): "TSH", "T4TOTAL", "T3FREE", "THYROIDAB" in the last 72 hours.  Invalid input(s): "FREET3" Anemia work up No results for input(s): "VITAMINB12", "FOLATE", "FERRITIN", "TIBC", "IRON", "RETICCTPCT" in the last 72 hours. Urinalysis    Component Value Date/Time   COLORURINE RED (A) 09/25/2022 1029   APPEARANCEUR TURBID (A) 09/25/2022 1029   LABSPEC  09/25/2022 1029    TEST NOT REPORTED DUE TO COLOR INTERFERENCE OF URINE PIGMENT   PHURINE  09/25/2022 1029    TEST NOT REPORTED DUE TO COLOR INTERFERENCE OF URINE PIGMENT   GLUCOSEU (A) 09/25/2022 1029    TEST NOT REPORTED DUE TO COLOR INTERFERENCE OF URINE PIGMENT   HGBUR (A) 09/25/2022 1029    TEST NOT REPORTED DUE TO COLOR INTERFERENCE OF URINE PIGMENT   BILIRUBINUR (A) 09/25/2022 1029    TEST NOT REPORTED DUE TO COLOR INTERFERENCE OF URINE PIGMENT   KETONESUR (A) 09/25/2022 1029    TEST NOT REPORTED DUE TO COLOR INTERFERENCE OF URINE PIGMENT   PROTEINUR (A) 09/25/2022 1029    TEST NOT REPORTED DUE TO  COLOR INTERFERENCE OF URINE PIGMENT   UROBILINOGEN 0.2 06/12/2014 1400   NITRITE (A) 09/25/2022 1029    TEST NOT REPORTED DUE TO COLOR INTERFERENCE OF URINE PIGMENT   LEUKOCYTESUR (A) 09/25/2022 1029    TEST NOT REPORTED DUE TO COLOR INTERFERENCE OF URINE PIGMENT   Sepsis Labs Recent Labs  Lab 09/26/22 0525 09/27/22 0521 09/28/22 0510 09/29/22 0508  WBC 14.7* 9.7 8.3 7.2   Microbiology Recent Results (from the past 240 hour(s))  Blood culture (routine x 2)     Status: None   Collection Time: 09/25/22  2:55 PM   Specimen: BLOOD RIGHT FOREARM  Result Value Ref Range Status   Specimen Description   Final    BLOOD RIGHT FOREARM Performed at [facility], [address], [place], [place] [address]    Special Requests   Final    BOTTLES DRAWN AEROBIC AND ANAEROBIC Blood Culture results may not be optimal due to an excessive volume of blood received in culture bottles Performed at [facility], [address] [place], [place] [address]    Culture   Final    NO GROWTH 5 DAYS Performed at [facility], [address] [place], [place] [address]    Report Status 09/30/2022 FINAL  Final  Blood culture (routine x 2)     Status: None  Collection Time: 09/25/22  3:07 PM   Specimen: BLOOD RIGHT HAND  Result Value Ref Range Status   Specimen Description   Final    BLOOD RIGHT HAND Performed at [facility], [address], [place], [place] [address]    Special Requests   Final    BOTTLES DRAWN AEROBIC AND ANAEROBIC Blood Culture results may not be optimal due to an excessive volume of blood received in culture bottles Performed at [facility], [address], [place], [place] [address]    Culture   Final    NO GROWTH 5 DAYS Performed at [facility], [address], [place], [place] [address]    Report Status 09/30/2022 FINAL  Final  MRSA Next Gen by PCR, Nasal     Status: None   Collection  Time: 09/25/22 10:41 PM   Specimen: Nasal Mucosa; Nasal Swab  Result Value Ref Range Status   MRSA by PCR Next Gen NOT DETECTED NOT DETECTED Final    Comment: (NOTE) The GeneXpert MRSA Assay (FDA approved for NASAL specimens only), is one component of a comprehensive MRSA colonization surveillance program. It is not intended to diagnose MRSA infection nor to guide or monitor treatment for MRSA infections. Test performance is not FDA approved in patients less than 49 years old. Performed at [facility], [address], [place], [place] [address]     Procedures/Studies: ECHOCARDIOGRAM COMPLETE  Result Date: 09/26/2022    ECHOCARDIOGRAM REPORT   Patient Name:   Frank [name] Date of Exam: 09/26/2022 Medical Rec #:  [id number]      Height:       68.0 in Accession #:    [id number]     Weight:       150.1 lb Date of Birth:  02-13-1956     BSA:          1.809 m Patient Age:    3 years       BP:           112/63 mmHg Patient Gender: M              HR:           94 bpm. Exam Location:  Inpatient Procedure: 2D Echo, Cardiac Doppler and Color Doppler Indications:    Abnormal ECG  History:        Patient has prior history of Echocardiogram examinations, most                 recent 06/06/2012. Abnormal ECG, COPD, Signs/Symptoms:Murmur;                 Risk Factors:Current Smoker.  Sonographer:    Darlys Gales Referring Phys: [id number] Sharika Mosquera MANUEL ORTIZ IMPRESSIONS  1. Left ventricular ejection fraction, by estimation, is 60 to 65%. The left ventricle has normal function. The left ventricle has no regional wall motion abnormalities. Left ventricular diastolic parameters are consistent with Grade I diastolic dysfunction (impaired relaxation).  2. Right ventricular systolic function is normal. The right ventricular size is mildly enlarged. There is mildly elevated pulmonary artery systolic pressure. The estimated right ventricular systolic pressure is 36.2 mmHg.  3. The mitral valve is normal in  structure. No evidence of mitral valve regurgitation. No evidence of mitral stenosis.  4. The aortic valve is tricuspid. There is mild calcification of the aortic valve. Aortic valve regurgitation is not visualized. Aortic valve sclerosis is present, with no evidence of aortic valve stenosis.  5. The inferior vena cava is normal in size with greater than 50% respiratory variability, suggesting right atrial pressure of 3 mmHg. Comparison(s): Prior images unable to be directly viewed, comparison made by report only. FINDINGS  Left Ventricle: Left ventricular ejection fraction, by estimation, is 60 to 65%. The left ventricle has normal function. The left ventricle has no regional wall motion abnormalities. The left ventricular internal cavity size was normal in size. There is  no left ventricular hypertrophy. Left ventricular diastolic parameters are consistent with Grade I diastolic dysfunction (impaired relaxation). Indeterminate filling pressures. Right Ventricle: The right ventricular size is mildly enlarged. Right vetricular wall thickness was not well visualized. Right ventricular systolic function is normal. There is mildly elevated pulmonary artery systolic pressure. The tricuspid regurgitant  velocity is 2.88 m/s, and with an assumed right atrial pressure of 3 mmHg, the estimated right ventricular systolic pressure is 36.2 mmHg. Left Atrium: Left atrial size was normal in size. Right Atrium: Right atrial size was normal in size. Pericardium: There is no evidence of pericardial effusion. Mitral Valve: The mitral valve is normal in structure. Mild mitral annular calcification. No evidence of mitral valve regurgitation. No evidence of mitral valve stenosis. Tricuspid Valve: The tricuspid valve is normal in structure. Tricuspid valve regurgitation is mild . No evidence of tricuspid stenosis. Aortic Valve: The aortic valve is tricuspid. There is mild calcification of the aortic valve. Aortic valve regurgitation is  not visualized. Aortic valve sclerosis is present, with no evidence of aortic valve stenosis. Aortic valve mean gradient measures 4.0 mmHg. Aortic valve peak gradient measures 6.7 mmHg. Aortic valve area, by VTI measures 2.74 cm. Pulmonic Valve: The pulmonic valve was normal in structure. Pulmonic valve regurgitation is not visualized. No evidence of pulmonic stenosis. Aorta: The aortic root is normal in size and structure. Venous: The inferior vena cava is normal in size with greater than 50% respiratory variability, suggesting right atrial pressure of 3 mmHg. IAS/Shunts: No atrial level shunt detected by color flow Doppler.  LEFT VENTRICLE PLAX 2D LVIDd:         4.50 cm   Diastology LVIDs:         3.30 cm   LV e' medial:    5.98 cm/s LV PW:         0.90 cm   LV E/e' medial:  15.7 LV IVS:        0.60 cm   LV e' lateral:   11.20 cm/s LVOT diam:     2.00 cm   LV E/e' lateral: 8.4 LV SV:         68 LV SV Index:   38 LVOT Area:     3.14 cm  RIGHT VENTRICLE RV S prime:     14.50 cm/s TAPSE (M-mode): 2.7 cm LEFT ATRIUM             Index        RIGHT ATRIUM           Index LA Vol (A2C):   39.1 ml 21.61 ml/m  RA Area:     10.70 cm LA Vol (A4C):   42.6 ml 23.55 ml/m  RA Volume:   23.10 ml  12.77 ml/m LA Biplane Vol: 41.2 ml 22.77 ml/m  AORTIC VALVE AV Area (Vmax):    3.36 cm AV Area (Vmean):   2.98 cm AV Area (VTI):     2.74 cm AV Vmax:           129.00 cm/s AV Vmean:  95.200 cm/s AV VTI:            0.250 m AV Peak Grad:      6.7 mmHg AV Mean Grad:      4.0 mmHg LVOT Vmax:         138.00 cm/s LVOT Vmean:        90.300 cm/s LVOT VTI:          0.218 m LVOT/AV VTI ratio: 0.87 MITRAL VALVE                TRICUSPID VALVE MV Area (PHT): 2.73 cm     TR Peak grad:   33.2 mmHg MV Decel Time: 278 msec     TR Vmax:        288.00 cm/s MV E velocity: 94.10 cm/s MV A velocity: 114.00 cm/s  SHUNTS MV E/A ratio:  0.83         Systemic VTI:  0.22 m                             Systemic Diam: 2.00 cm Rachelle Hora Croitoru MD  Electronically signed by Thurmon Fair MD Signature Date/Time: 09/26/2022/9:26:32 AM    Final    CT Angio Chest PE W and/or Wo Contrast  Result Date: 09/25/2022 CLINICAL DATA:  Positive D-dimer. Hematuria. Shortness of breath at baseline. EXAM: CT ANGIOGRAPHY CHEST CT ABDOMEN AND PELVIS WITH CONTRAST TECHNIQUE: Multidetector CT imaging of the chest was performed using the standard protocol during bolus administration of intravenous contrast. Multiplanar CT image reconstructions and MIPs were obtained to evaluate the vascular anatomy. Multidetector CT imaging of the abdomen and pelvis was performed using the standard protocol during bolus administration of intravenous contrast. RADIATION DOSE REDUCTION: This exam was performed according to the departmental dose-optimization program which includes automated exposure control, adjustment of the mA and/or kV according to patient size and/or use of iterative reconstruction technique. CONTRAST:  OMNIPAQUE IOHEXOL 350 MG/ML SOLN COMPARISON:  CT chest 09/11/2022 Renal stone protocol CT 08/04/2022 FINDINGS: CTA CHEST FINDINGS Cardiovascular: Heart size within normal limits. Coronary artery calcifications are present. No acute abnormality of the thoracic aorta. Calcified atheromatous plaque seen scattered throughout the aortic arch and descending thoracic aorta. No pulmonary artery embolism. Mediastinum/Nodes: No enlarged mediastinal, hilar, or axillary lymph nodes. Thyroid gland, trachea, and esophagus demonstrate no significant findings. Lungs/Pleura: Severe emphysematous changes the lungs. Nodular airspace opacities again seen at the lung bases, similar to prior examination from 09/11/2022. Interval resolution of trace bilateral pleural effusions. Debris noted within the right mainstem bronchus and bronchus intermedius. Debris also noted within the left main bronchus extending into the lower lobe bronchus. Musculoskeletal: Diffuse osseous metastatic disease again  seen. Review of the MIP images confirms the above findings. CT ABDOMEN and PELVIS FINDINGS Hepatobiliary: No focal liver abnormality is seen. No gallstones, gallbladder wall thickening, or biliary dilatation. Pancreas: Unremarkable. No pancreatic ductal dilatation or surrounding inflammatory changes. Spleen: Normal in size without focal abnormality. Adrenals/Urinary Tract: Adrenal glands are normal. 2.1 cm right lower pole and 1.2 cm left upper pole simple cysts do not require dedicated imaging follow-up. No hydronephrosis or hydroureter. Hyperdense content of the bladder most likely due to hemorrhagic blood products. Air within the bladder lumen is likely iatrogenic given presence of Foley catheter. Stomach/Bowel: Small hiatal hernia. No bowel dilatation to indicate ileus or obstruction. Normal appendix. Vascular/Lymphatic: No enlarged abdominal or pelvic lymph nodes. Atheromatous calcified plaque seen throughout the abdominal aorta and  visualized arteries. Reproductive: The prostate is not well delineated and may be surgically absent. It is difficult to delineate between the borders of the bladder and prostate. Other: Small umbilical hernia containing short segment of nondilated bowel loop. Bilateral fat containing inguinal hernias, larger on the left. Musculoskeletal: No acute osseous abnormality. 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 Review of the MIP images confirms the above findings. IMPRESSION: 1. No pulmonary artery embolism. 2. Severe emphysematous changes of the lungs. 3. Nodular airspace opacities at the lung bases, similar to prior examination from 09/11/2022, and likely infectious/inflammatory in etiology. Findings May be the result of recurrent aspiration. 4. Debris noted within the right mainstem bronchus and bronchus intermedius. Debris also noted within the left main bronchus extending into  the lower lobe bronchus. 5. Interval development of hyperdense content of the bladder most likely due to hemorrhagic blood products. Prostate is not well delineated. 6. 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 Emphysema (ICD10-J43.9). Electronically Signed   By: Acquanetta Belling M.D.   On: 09/25/2022 13:22   CT ABDOMEN PELVIS W CONTRAST  Result Date: 09/25/2022 CLINICAL DATA:  Positive D-dimer. Hematuria. Shortness of breath at baseline. EXAM: CT ANGIOGRAPHY CHEST CT ABDOMEN AND PELVIS WITH CONTRAST TECHNIQUE: Multidetector CT imaging of the chest was performed using the standard protocol during bolus administration of intravenous contrast. Multiplanar CT image reconstructions and MIPs were obtained to evaluate the vascular anatomy. Multidetector CT imaging of the abdomen and pelvis was performed using the standard protocol during bolus administration of intravenous contrast. RADIATION DOSE REDUCTION: This exam was performed according to the departmental dose-optimization program which includes automated exposure control, adjustment of the mA and/or kV according to patient size and/or use of iterative reconstruction technique. CONTRAST:  OMNIPAQUE IOHEXOL 350 MG/ML SOLN COMPARISON:  CT chest 09/11/2022 Renal stone protocol CT 08/04/2022 FINDINGS: CTA CHEST FINDINGS Cardiovascular: Heart size within normal limits. Coronary artery calcifications are present. No acute abnormality of the thoracic aorta. Calcified atheromatous plaque seen scattered throughout the aortic arch and descending thoracic aorta. No pulmonary artery embolism. Mediastinum/Nodes: No enlarged mediastinal, hilar, or axillary lymph nodes. Thyroid gland, trachea, and esophagus demonstrate no significant findings. Lungs/Pleura: Severe emphysematous changes the lungs. Nodular airspace opacities again seen at the lung bases, similar to prior examination from 09/11/2022. Interval resolution of trace bilateral pleural effusions. Debris  noted within the right mainstem bronchus and bronchus intermedius. Debris also noted within the left main bronchus extending into the lower lobe bronchus. Musculoskeletal: Diffuse osseous metastatic disease again seen. Review of the MIP images confirms the above findings. CT ABDOMEN and PELVIS FINDINGS Hepatobiliary: No focal liver abnormality is seen. No gallstones, gallbladder wall thickening, or biliary dilatation. Pancreas: Unremarkable. No pancreatic ductal dilatation or surrounding inflammatory changes. Spleen: Normal in size without focal abnormality. Adrenals/Urinary Tract: Adrenal glands are normal. 2.1 cm right lower pole and 1.2 cm left upper pole simple cysts do not require dedicated imaging follow-up. No hydronephrosis or hydroureter. Hyperdense content of the bladder most likely due to hemorrhagic blood products. Air within the bladder lumen is likely iatrogenic given presence of Foley catheter. Stomach/Bowel: Small hiatal hernia. No bowel dilatation to indicate ileus or obstruction. Normal appendix. Vascular/Lymphatic: No enlarged abdominal or pelvic lymph nodes. Atheromatous calcified plaque seen throughout the abdominal aorta and visualized arteries. Reproductive: The prostate is not well delineated and may be surgically absent. It is difficult to delineate between the borders of the bladder  and prostate. Other: Small umbilical hernia containing short segment of nondilated bowel loop. Bilateral fat containing inguinal hernias, larger on the left. Musculoskeletal: No acute osseous abnormality. 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 Review of the MIP images confirms the above findings. IMPRESSION: 1. No pulmonary artery embolism. 2. Severe emphysematous changes of the lungs. 3. Nodular airspace opacities at the lung bases, similar to prior examination from 09/11/2022, and likely  infectious/inflammatory in etiology. Findings May be the result of recurrent aspiration. 4. Debris noted within the right mainstem bronchus and bronchus intermedius. Debris also noted within the left main bronchus extending into the lower lobe bronchus. 5. Interval development of hyperdense content of the bladder most likely due to hemorrhagic blood products. Prostate is not well delineated. 6. 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 Emphysema (ICD10-J43.9). Electronically Signed   By: Acquanetta Belling M.D.   On: 09/25/2022 13:22   DG Chest Portable 1 View  Result Date: 09/25/2022 CLINICAL DATA:  Tachycardia, flank pain and hematuria. EXAM: PORTABLE CHEST 1 VIEW COMPARISON:  09/09/2022 FINDINGS: The heart size and mediastinal contours are within normal limits. Stable advanced emphysematous lung disease. There is no evidence of pulmonary edema, consolidation, pneumothorax or pleural fluid. The visualized skeletal structures are unremarkable. IMPRESSION: Stable advanced emphysematous lung disease. No acute findings. Electronically Signed   By: Irish Lack M.D.   On: 09/25/2022 10:19   CT Angio Chest Pulmonary Embolism (PE) W or WO Contrast  Result Date: 09/11/2022 CLINICAL DATA:  67 year old male with shortness of breath. EXAM: CT ANGIOGRAPHY CHEST WITH CONTRAST TECHNIQUE: Multidetector CT imaging of the chest was performed using the standard protocol during bolus administration of intravenous contrast. Multiplanar CT image reconstructions and MIPs were obtained to evaluate the vascular anatomy. RADIATION DOSE REDUCTION: This exam was performed according to the departmental dose-optimization program which includes automated exposure control, adjustment of the mA and/or kV according to patient size and/or use of iterative reconstruction technique. CONTRAST:  75mL OMNIPAQUE IOHEXOL 350 MG/ML SOLN COMPARISON:  Portable chest x-ray 09/09/2022. CT Abdomen and Pelvis 08/04/2022. FINDINGS: Cardiovascular:  Good contrast bolus timing in the pulmonary arterial tree. No focal filling defect identified in the pulmonary arteries to suggest acute pulmonary embolism. Calcified coronary artery plaque and/or stents. Calcified aortic atherosclerosis. Cardiac size remains within normal limits. No pericardial effusion. Mediastinum/Nodes: Fluid-filled esophagus throughout the chest, mildly dilated. No superimposed mediastinal mass or lymphadenopathy. Lungs/Pleura: Moderate to severe emphysema in all lobes. Trace retained secretions in the airways with some major airway atelectatic changes. Retained secretions in the left mainstem bronchus. Partially opacified lower lobe airways greater on the right side (series 6, image 107). New confluent but enhancing bilateral costophrenic angle opacity more resembles atelectasis than pneumonia. Trace new pleural effusions since March. Upper Abdomen: Fluid-filled gastroesophageal junction with dilated phrenic ampulla or small gastric hiatal hernia. Air-fluid level in the stomach. However, the partially visible duodenal bulb might not be dilated. No superimposed free air or free fluid in the upper abdomen. Stable and negative visible other upper abdominal viscera. Musculoskeletal: Numerous small sclerotic foci scattered in the spine, and a larger 2 cm heterogeneous sclerotic lesion in the posterior right T7 body. The patient has a history of metastatic prostate cancer. Similar scattered sclerotic rib metastases. Rib motion artifact. No acute rib or other pathologic fracture is identified. Review of the MIP images confirms the above findings. IMPRESSION: 1. No evidence of acute pulmonary embolus. 2. Fluid-filled  and mildly dilated Esophagus and visible Stomach. Query bowel obstruction in the abdomen. No upper abdominal free air or free fluid identified. 3. Severe Emphysema (ICD10-J43.9). Trace bilateral pleural effusions. Retained or aspirated secretions in some of the lower lobe airways. But  dependent lower lobe opacity more resembles atelectasis than pneumonia. 4. Widely scattered sclerotic bone metastases. 5.  Aortic Atherosclerosis (ICD10-I70.0). Electronically Signed   By: Odessa Fleming M.D.   On: 09/11/2022 10:52   DG Chest Port 1 View  Result Date: 09/09/2022 CLINICAL DATA:  Oxygen desaturation.  Shortness of breath, cough EXAM: PORTABLE CHEST 1 VIEW COMPARISON:  08/05/2022 FINDINGS: Heart and mediastinal contours within normal limits. Aortic atherosclerosis. Stable mild hyperinflation. No confluent opacities or effusions. No acute bony abnormality. IMPRESSION: No active disease. Electronically Signed   By: Charlett Nose M.D.   On: 09/09/2022 23:09     Time coordinating discharge: Over 30 minutes    Lewie Chamber, MD  [facility] 09/30/2022, 2:02 PM

## 2022-10-02 DIAGNOSIS — J449 Chronic obstructive pulmonary disease, unspecified: Secondary | ICD-10-CM | POA: Diagnosis not present

## 2022-10-03 ENCOUNTER — Telehealth: Payer: Self-pay

## 2022-10-03 NOTE — Consult Note (Signed)
   [facility] Inpatient Consult   10/03/2022  VIN GOERLITZ May 14, 1955 [id number]  [facility] [[facility]]  Accountable Care Organization [ACO] Patient: Valinda Hoar [facility] Medicare  Primary Care Provider:  Crissie Sickles, MD with [facility] Internal Medicine  *[facility] Liaison coverage for patient that was readmitted to [facility] and transitioned home.  Patient is newly active with [facility] [[facility]] Care Management for chronic disease management services.  Patient has been engaged by a [facility] CC.  Our community based plan of care has focused on disease management and community resource support.    Patient transitioned home over the weekend and was high risk for unplanned readmission.  He readmitted to [facility] with less than 30 days readmission.  Plan: Updated [facility] RN CC of readmission and transitioned home with HH.  Of note, [facility] Care Management services does not replace or interfere with any services that are needed or arranged by inpatient [facility] care management team.   For additional questions or referrals please contact:  Charlesetta Shanks, RN BSN CCM [facility]  [phone] business mobile phone Toll free office [phone]  *Concierge Line  [phone] Fax number: [phone] [email or web address]

## 2022-10-03 NOTE — Transitions of Care (Post Inpatient/ED Visit) (Signed)
10/03/2022  Name: Frank [name] MRN: [id number] DOB: March 26, 1956  Today's TOC FU Call Status: Today's TOC FU Call Status:: Successful TOC FU Call Competed TOC FU Call Complete Date: 10/03/22  Transition Care Management Follow-up Telephone Call Date of Discharge: 09/30/22 Discharge Facility: [facility]) Type of Discharge: Inpatient Admission Primary Inpatient Discharge Diagnosis:: Pneumonia How have you been since you were released from the hospital?: Better (Per Sister, Frank [name]) Any questions or concerns?: No  Items Reviewed: Did you receive and understand the discharge instructions provided?: Yes Medications obtained,verified, and reconciled?: Yes (Medications Reviewed) Any new allergies since your discharge?: No Dietary orders reviewed?: Yes Type of Diet Ordered:: Regular-General Do you have support at home?: Yes People in Home: sibling(s) Name of Support/Comfort Primary Source: Frank [name]  Medications Reviewed Today: Medications Reviewed Today     Reviewed by Jodelle Gross, RN (Case Manager) on 10/03/22 at 1332  Med List Status: <None>   Medication Order Taking? Sig Documenting Provider Last Dose Status Informant  abiraterone acetate (ZYTIGA) 250 MG tablet [id number] No Take 1,000 mg by mouth daily. [provider] Unknown Active Spouse/Significant Other, Pharmacy Records  albuterol (ACCUNEB) 0.63 MG/3ML nebulizer solution [id number] Yes USE 1 VIAL VIA NEBULIZER EVERY 6 HOURS  Patient taking differently: Take 1 ampule by nebulization every 6 (six) hours. COPD   Leslye Peer, MD Taking Active Spouse/Significant Other, Pharmacy Records  albuterol (VENTOLIN HFA) 108 (90 Base) MCG/ACT inhaler [id number] Yes INHALE 2 PUFFS BY MOUTH EVERY 4 HOURS AS NEEDED FOR WHEEZING OR SHORTNESS OF BREATH  Patient taking differently: Inhale 1 puff into the lungs every 4 (four) hours as needed for shortness of breath (COPD).   Luciano Cutter, MD Taking Active Spouse/Significant  Other, Pharmacy Records  Calcium Carb-Cholecalciferol (OYSTER SHELL CALCIUM/D3 PO) [id number] No Take 1 tablet by mouth daily with breakfast. 600 mg [provider] Unknown Active Spouse/Significant Other, Pharmacy Records  fluticasone (FLONASE) 50 MCG/ACT nasal spray [id number] Yes Place 1 spray into both nostrils daily.  Patient taking differently: Place 1 spray into both nostrils 2 (two) times daily.   Leslye Peer, MD Taking Active Spouse/Significant Other, Pharmacy Records  Fluticasone-Umeclidin-[facility] ELLIPTA) 100-62.5-25 MCG/ACT AEPB [id number] Yes INHALE 1 PUFF INTO THE LUNGS DAILY  Patient taking differently: Take 1 puff by mouth daily.   Luciano Cutter, MD Taking Active Spouse/Significant Other, Pharmacy Records  ipratropium-albuterol (DUONEB) 0.5-2.5 (3) MG/3ML SOLN [id number] Yes Take 3 mLs by nebulization every 6 (six) hours.  Patient taking differently: Take 3 mLs by nebulization every 6 (six) hours. COPD   Regalado, Prentiss Bells, MD Taking Active Spouse/Significant Other, Pharmacy Records  loratadine (CLARITIN) 10 MG tablet [id number] Yes Take 1 tablet (10 mg total) by mouth daily. Alba Cory, MD Taking Active Spouse/Significant Other, Pharmacy Records  OXYGEN [id number] Yes Inhale 4-5 L/min into the lungs continuous. [provider] Taking Active Spouse/Significant Other, Pharmacy Records  predniSONE (DELTASONE) 10 MG tablet [id number] Yes Take 1 tablet (10 mg total) by mouth daily with breakfast. Lewie Chamber, MD Taking Active             Home Care and Equipment/Supplies: Were Home Health Services Ordered?: Yes Name of Home Health Agency:: [facility] Has Agency set up a time to come to your home?: No EMR reviewed for Home Health Orders:  (Patient has been active with [facility] prior to admission) Any new equipment or medical supplies ordered?: No  Functional Questionnaire: Do you need assistance with bathing/showering or dressing?: No Do you  need assistance with meal preparation?: No Do you need assistance with eating?: No Do you have difficulty maintaining continence: No Do you need assistance with getting out of bed/getting out of a chair/moving?: No Do you have difficulty managing or taking your medications?: No  Follow up appointments reviewed: PCP Follow-up appointment confirmed?: No MD Provider Line Number:[phone] Given: No [facility] prefers to make HFU appts.  Message sent to [facility] Front Desk Staff to schedule appointment.) Specialist Hospital Follow-up appointment confirmed?: NA Do you need transportation to your follow-up appointment?: No Do you understand care options if your condition(s) worsen?: Yes-patient verbalized understanding  SDOH Interventions Today    Flowsheet Row Most Recent Value  SDOH Interventions   Food Insecurity Interventions Intervention Not Indicated  Housing Interventions Intervention Not Indicated  Utilities Interventions Intervention Not Indicated      Interventions Today    Flowsheet Row Most Recent Value  Chronic Disease   Chronic disease during today's visit Chronic Obstructive Pulmonary Disease (COPD)  [Prostate Cancer with Mets]  General Interventions   General Interventions Discussed/Reviewed General Interventions Discussed, General Interventions Reviewed  Nutrition Interventions   Nutrition Discussed/Reviewed Nutrition Discussed, Fluid intake  Pharmacy Interventions   Pharmacy Dicussed/Reviewed Pharmacy Topics Discussed       TOC Interventions Today    Flowsheet Row Most Recent Value  TOC Interventions   TOC Interventions Discussed/Reviewed TOC Interventions Discussed, TOC Interventions Reviewed, Contacted provider for patient needs  [facility] sent to [facility] front desk to schedule HFU for patient]      Jodelle Gross, RN, BSN, CCM Care Management Coordinator [facility] Phone: [phone]/Fax: [phone]

## 2022-10-04 ENCOUNTER — Telehealth: Payer: Self-pay | Admitting: Adult Health

## 2022-10-04 DIAGNOSIS — Z9181 History of falling: Secondary | ICD-10-CM | POA: Diagnosis not present

## 2022-10-04 DIAGNOSIS — N281 Cyst of kidney, acquired: Secondary | ICD-10-CM | POA: Diagnosis not present

## 2022-10-04 DIAGNOSIS — Z8616 Personal history of COVID-19: Secondary | ICD-10-CM | POA: Diagnosis not present

## 2022-10-04 DIAGNOSIS — C7951 Secondary malignant neoplasm of bone: Secondary | ICD-10-CM | POA: Diagnosis not present

## 2022-10-04 DIAGNOSIS — J439 Emphysema, unspecified: Secondary | ICD-10-CM | POA: Diagnosis not present

## 2022-10-04 DIAGNOSIS — N401 Enlarged prostate with lower urinary tract symptoms: Secondary | ICD-10-CM | POA: Diagnosis not present

## 2022-10-04 DIAGNOSIS — N179 Acute kidney failure, unspecified: Secondary | ICD-10-CM | POA: Diagnosis not present

## 2022-10-04 DIAGNOSIS — Z466 Encounter for fitting and adjustment of urinary device: Secondary | ICD-10-CM | POA: Diagnosis not present

## 2022-10-04 DIAGNOSIS — J9621 Acute and chronic respiratory failure with hypoxia: Secondary | ICD-10-CM | POA: Diagnosis not present

## 2022-10-04 DIAGNOSIS — N138 Other obstructive and reflux uropathy: Secondary | ICD-10-CM | POA: Diagnosis not present

## 2022-10-04 DIAGNOSIS — Z9981 Dependence on supplemental oxygen: Secondary | ICD-10-CM | POA: Diagnosis not present

## 2022-10-04 DIAGNOSIS — Z7951 Long term (current) use of inhaled steroids: Secondary | ICD-10-CM | POA: Diagnosis not present

## 2022-10-04 DIAGNOSIS — Z7952 Long term (current) use of systemic steroids: Secondary | ICD-10-CM | POA: Diagnosis not present

## 2022-10-04 DIAGNOSIS — C61 Malignant neoplasm of prostate: Secondary | ICD-10-CM | POA: Diagnosis not present

## 2022-10-04 DIAGNOSIS — J441 Chronic obstructive pulmonary disease with (acute) exacerbation: Secondary | ICD-10-CM | POA: Diagnosis not present

## 2022-10-04 NOTE — Telephone Encounter (Signed)
April called in bc Pt wants to take mucinex and she wants to know if it is okay for him to have

## 2022-10-05 ENCOUNTER — Ambulatory Visit: Payer: Self-pay

## 2022-10-06 DIAGNOSIS — Z9181 History of falling: Secondary | ICD-10-CM | POA: Diagnosis not present

## 2022-10-06 DIAGNOSIS — Z9981 Dependence on supplemental oxygen: Secondary | ICD-10-CM | POA: Diagnosis not present

## 2022-10-06 DIAGNOSIS — Z7951 Long term (current) use of inhaled steroids: Secondary | ICD-10-CM | POA: Diagnosis not present

## 2022-10-06 DIAGNOSIS — C61 Malignant neoplasm of prostate: Secondary | ICD-10-CM | POA: Diagnosis not present

## 2022-10-06 DIAGNOSIS — N401 Enlarged prostate with lower urinary tract symptoms: Secondary | ICD-10-CM | POA: Diagnosis not present

## 2022-10-06 DIAGNOSIS — N138 Other obstructive and reflux uropathy: Secondary | ICD-10-CM | POA: Diagnosis not present

## 2022-10-06 DIAGNOSIS — N179 Acute kidney failure, unspecified: Secondary | ICD-10-CM | POA: Diagnosis not present

## 2022-10-06 DIAGNOSIS — J9621 Acute and chronic respiratory failure with hypoxia: Secondary | ICD-10-CM | POA: Diagnosis not present

## 2022-10-06 DIAGNOSIS — J441 Chronic obstructive pulmonary disease with (acute) exacerbation: Secondary | ICD-10-CM | POA: Diagnosis not present

## 2022-10-06 DIAGNOSIS — C7951 Secondary malignant neoplasm of bone: Secondary | ICD-10-CM | POA: Diagnosis not present

## 2022-10-06 DIAGNOSIS — Z8616 Personal history of COVID-19: Secondary | ICD-10-CM | POA: Diagnosis not present

## 2022-10-06 DIAGNOSIS — Z7952 Long term (current) use of systemic steroids: Secondary | ICD-10-CM | POA: Diagnosis not present

## 2022-10-06 DIAGNOSIS — J439 Emphysema, unspecified: Secondary | ICD-10-CM | POA: Diagnosis not present

## 2022-10-06 DIAGNOSIS — N281 Cyst of kidney, acquired: Secondary | ICD-10-CM | POA: Diagnosis not present

## 2022-10-06 DIAGNOSIS — Z466 Encounter for fitting and adjustment of urinary device: Secondary | ICD-10-CM | POA: Diagnosis not present

## 2022-10-06 NOTE — Patient Instructions (Signed)
Visit Information  Thank you for taking time to visit with me today. Please don't hesitate to contact me if I can be of assistance to you.   Following are the goals we discussed today:   Goals Addressed             This Visit's Progress    I will monitor and control the best I can       Patient Goals/Self Care Activities: -Patient/Caregiver will self-administer medications as prescribed as evidenced by self-report/primary caregiver report  -Patient/Caregiver will attend all scheduled provider appointments as evidenced by clinician review of documented attendance to scheduled appointments and patient/caregiver report -Patient/Caregiver will call pharmacy for medication refills as evidenced by patient report and review of pharmacy fill history as appropriate -Patient/Caregiver will call provider office for new concerns or questions as evidenced by review of documented incoming telephone call notes and patient report -Patient/Caregiver verbalizes understanding of plan -Patient/Caregiver will focus on medication adherence by taking medications as prescribed  Provided patient with basic written and verbal COPD education on self care/management/and exacerbation prevention Advised patient to track and manage COPD triggers Advised patient to self assesses COPD action plan zone and make appointment with provider if in the yellow zone for 48 hours without improvement Discussed the importance of adequate rest and management of fatigue with COPD Active listening / Reflection utilized  Emotional Support Provided Problem Solving /Task Center strategies reviewed         Our next appointment is by telephone on 10/19/22 at 11 00 am  Please call the care guide team at [phone] if you need to cancel or reschedule your appointment.   If you are experiencing a Mental Health or Behavioral Health Crisis or need someone to talk to, please call 1-800-273-TALK (toll free, 24 hour hotline)  Patient  verbalizes understanding of instructions and care plan provided today and agrees to view in MyChart. Active MyChart status and patient understanding of how to access instructions and care plan via MyChart confirmed with patient.     Juanell Fairly RN, BSN, [facility] Care Coordinator [facility]   Phone: [phone]

## 2022-10-06 NOTE — Patient Outreach (Signed)
  Care Coordination   Initial Visit Note   10/06/2022 Name: BRANN BARSCH MRN: [id number] DOB: 1955/08/29  CRISTHOFER BRIGHAM is a 67 y.o. year old male who sees Crissie Sickles, MD for primary care. I  I spoke with [name] Mr. Tennenbaum sister.  What matters to the patients health and wellness today?  I spoke with Mr. Joshlin sister, [name]. She helps take care of him. He was recently in the hospital for pneumonia and complications of a Foley catheter. He finished his antibiotics in the hospital. He is on 5 liters of oxygen. [name] gives him supplemental drinks to help with his decreased appetite. I'm going to provide her with some samples of supplemental drinks and coupons in the office. I have also sent a message to the front desk to help schedule an appointment for the patient in the office for next week.    Goals Addressed             This Visit's Progress    I will monitor and control the best I can       Patient Goals/Self Care Activities: -Patient/Caregiver will self-administer medications as prescribed as evidenced by self-report/primary caregiver report  -Patient/Caregiver will attend all scheduled provider appointments as evidenced by clinician review of documented attendance to scheduled appointments and patient/caregiver report -Patient/Caregiver will call pharmacy for medication refills as evidenced by patient report and review of pharmacy fill history as appropriate -Patient/Caregiver will call provider office for new concerns or questions as evidenced by review of documented incoming telephone call notes and patient report -Patient/Caregiver verbalizes understanding of plan -Patient/Caregiver will focus on medication adherence by taking medications as prescribed  Provided patient with basic written and verbal COPD education on self care/management/and exacerbation prevention Advised patient to track and manage COPD triggers Advised patient to self assesses COPD action plan zone  and make appointment with provider if in the yellow zone for 48 hours without improvement Discussed the importance of adequate rest and management of fatigue with COPD Active listening / Reflection utilized  Emotional Support Provided Problem Solving /Task Center strategies reviewed         SDOH assessments and interventions completed:  Yes  SDOH Interventions Today    Flowsheet Row Most Recent Value  SDOH Interventions   Food Insecurity Interventions Intervention Not Indicated  Transportation Interventions Intervention Not Indicated        Care Coordination Interventions:  Yes, provided   Interventions Today    Flowsheet Row Most Recent Value  Chronic Disease   Chronic disease during today's visit Chronic Obstructive Pulmonary Disease (COPD)  General Interventions   General Interventions Discussed/Reviewed General Interventions Reviewed  Safety Interventions   Safety Discussed/Reviewed Safety Discussed        Follow up plan: Follow up call scheduled for 10/19/22  1100 am    Encounter Outcome:  Pt. Visit Completed   Juanell Fairly RN, BSN, [facility] Care Coordinator [facility]   Phone: [phone]   .

## 2022-10-06 NOTE — Telephone Encounter (Signed)
Pt returning missed call. 

## 2022-10-06 NOTE — Telephone Encounter (Signed)
ATC the patient. LVM for the patient to return my call. 

## 2022-10-09 ENCOUNTER — Telehealth: Payer: Self-pay | Admitting: *Deleted

## 2022-10-09 DIAGNOSIS — N281 Cyst of kidney, acquired: Secondary | ICD-10-CM | POA: Diagnosis not present

## 2022-10-09 DIAGNOSIS — Z9981 Dependence on supplemental oxygen: Secondary | ICD-10-CM | POA: Diagnosis not present

## 2022-10-09 DIAGNOSIS — Z7952 Long term (current) use of systemic steroids: Secondary | ICD-10-CM | POA: Diagnosis not present

## 2022-10-09 DIAGNOSIS — J9621 Acute and chronic respiratory failure with hypoxia: Secondary | ICD-10-CM | POA: Diagnosis not present

## 2022-10-09 DIAGNOSIS — N179 Acute kidney failure, unspecified: Secondary | ICD-10-CM | POA: Diagnosis not present

## 2022-10-09 DIAGNOSIS — Z7951 Long term (current) use of inhaled steroids: Secondary | ICD-10-CM | POA: Diagnosis not present

## 2022-10-09 DIAGNOSIS — Z9181 History of falling: Secondary | ICD-10-CM | POA: Diagnosis not present

## 2022-10-09 DIAGNOSIS — Z466 Encounter for fitting and adjustment of urinary device: Secondary | ICD-10-CM | POA: Diagnosis not present

## 2022-10-09 DIAGNOSIS — J439 Emphysema, unspecified: Secondary | ICD-10-CM | POA: Diagnosis not present

## 2022-10-09 DIAGNOSIS — N401 Enlarged prostate with lower urinary tract symptoms: Secondary | ICD-10-CM | POA: Diagnosis not present

## 2022-10-09 DIAGNOSIS — Z8616 Personal history of COVID-19: Secondary | ICD-10-CM | POA: Diagnosis not present

## 2022-10-09 DIAGNOSIS — N138 Other obstructive and reflux uropathy: Secondary | ICD-10-CM | POA: Diagnosis not present

## 2022-10-09 DIAGNOSIS — J441 Chronic obstructive pulmonary disease with (acute) exacerbation: Secondary | ICD-10-CM | POA: Diagnosis not present

## 2022-10-09 DIAGNOSIS — C61 Malignant neoplasm of prostate: Secondary | ICD-10-CM | POA: Diagnosis not present

## 2022-10-09 DIAGNOSIS — C7951 Secondary malignant neoplasm of bone: Secondary | ICD-10-CM | POA: Diagnosis not present

## 2022-10-09 NOTE — Telephone Encounter (Signed)
Call from April, RN Frances Furbish is with patient to complete his Home med list.  Patient was previously on Mucinex daily  Was not discharged on the Mucinex daily.  Patient would like to continue taking if possible. Given verbal ok to continue.  Will forward message to PCP to approve or deny.

## 2022-10-10 ENCOUNTER — Encounter: Payer: Self-pay | Admitting: *Deleted

## 2022-10-10 DIAGNOSIS — N138 Other obstructive and reflux uropathy: Secondary | ICD-10-CM | POA: Diagnosis not present

## 2022-10-10 DIAGNOSIS — Z9981 Dependence on supplemental oxygen: Secondary | ICD-10-CM | POA: Diagnosis not present

## 2022-10-10 DIAGNOSIS — C61 Malignant neoplasm of prostate: Secondary | ICD-10-CM | POA: Diagnosis not present

## 2022-10-10 DIAGNOSIS — Z8616 Personal history of COVID-19: Secondary | ICD-10-CM | POA: Diagnosis not present

## 2022-10-10 DIAGNOSIS — Z466 Encounter for fitting and adjustment of urinary device: Secondary | ICD-10-CM | POA: Diagnosis not present

## 2022-10-10 DIAGNOSIS — J9621 Acute and chronic respiratory failure with hypoxia: Secondary | ICD-10-CM | POA: Diagnosis not present

## 2022-10-10 DIAGNOSIS — C7951 Secondary malignant neoplasm of bone: Secondary | ICD-10-CM | POA: Diagnosis not present

## 2022-10-10 DIAGNOSIS — Z9181 History of falling: Secondary | ICD-10-CM | POA: Diagnosis not present

## 2022-10-10 DIAGNOSIS — N179 Acute kidney failure, unspecified: Secondary | ICD-10-CM | POA: Diagnosis not present

## 2022-10-10 DIAGNOSIS — Z7952 Long term (current) use of systemic steroids: Secondary | ICD-10-CM | POA: Diagnosis not present

## 2022-10-10 DIAGNOSIS — J439 Emphysema, unspecified: Secondary | ICD-10-CM | POA: Diagnosis not present

## 2022-10-10 DIAGNOSIS — J441 Chronic obstructive pulmonary disease with (acute) exacerbation: Secondary | ICD-10-CM | POA: Diagnosis not present

## 2022-10-10 DIAGNOSIS — N281 Cyst of kidney, acquired: Secondary | ICD-10-CM | POA: Diagnosis not present

## 2022-10-10 DIAGNOSIS — N401 Enlarged prostate with lower urinary tract symptoms: Secondary | ICD-10-CM | POA: Diagnosis not present

## 2022-10-10 DIAGNOSIS — Z7951 Long term (current) use of inhaled steroids: Secondary | ICD-10-CM | POA: Diagnosis not present

## 2022-10-11 ENCOUNTER — Other Ambulatory Visit: Payer: Self-pay

## 2022-10-11 ENCOUNTER — Ambulatory Visit (INDEPENDENT_AMBULATORY_CARE_PROVIDER_SITE_OTHER): Payer: Medicare Other

## 2022-10-11 VITALS — BP 122/60 | HR 94 | Temp 98.8°F | Ht 62.0 in | Wt 140.5 lb

## 2022-10-11 DIAGNOSIS — D62 Acute posthemorrhagic anemia: Secondary | ICD-10-CM

## 2022-10-11 DIAGNOSIS — R310 Gross hematuria: Secondary | ICD-10-CM

## 2022-10-11 DIAGNOSIS — F419 Anxiety disorder, unspecified: Secondary | ICD-10-CM | POA: Diagnosis not present

## 2022-10-11 DIAGNOSIS — J449 Chronic obstructive pulmonary disease, unspecified: Secondary | ICD-10-CM

## 2022-10-11 NOTE — Progress Notes (Signed)
Internal Medicine Clinic Attending  Case discussed with Dr. White  At the time of the visit.  We reviewed the resident's history and exam and pertinent patient test results.  I agree with the assessment, diagnosis, and plan of care documented in the resident's note.  

## 2022-10-11 NOTE — Patient Instructions (Signed)
Frank [name], it was a pleasure seeing you today!  Today we discussed: Blood in urine / prostate cancer - will get labs to check for anemia. COPD - continue current regimen.  I have ordered the following labs today:   Lab Orders         CBC no Diff      Tests ordered today:  none  Referrals ordered today:   Referral Orders  No referral(s) requested today     I have ordered the following medication/changed the following medications:   Stop the following medications: There are no discontinued medications.   Start the following medications: No orders of the defined types were placed in this encounter.    Follow-up: 3 months   Please make sure to arrive 15 minutes prior to your next appointment. If you arrive late, you may be asked to reschedule.   We look forward to seeing you next time. Please call our clinic at [phone] if you have any questions or concerns. The best time to call is Monday-Friday from 9am-4pm, but there is someone available 24/7. If after hours or the weekend, call the main hospital number and ask for the Internal Medicine Resident On-Call. If you need medication refills, please notify your pharmacy one week in advance and they will send [place] a request.  Thank you for letting us take part in your care. Wishing you the best!  Thank you, Adron Bene, MD

## 2022-10-11 NOTE — Assessment & Plan Note (Addendum)
Patient reports anxiety related to occasional bouts of difficulty breathing in the setting of his COPD.  He denies SI, HI.  Denies frank panic attacks.  We discussed the possible benefits of SSRI therapy.  He would like to think about this and follow-up in the future.  I will schedule him for follow-up in 3 months but instructed him to call us sooner if his symptoms worsen and/or he feels its time to start therapy.  Also discussed possible benefit of counseling but he is not interested in this at this time. -Follow-up at next visit and to start SSRI if felt appropriate  Addendum: Discussed with patient over phone and they wish to begin Lexapro. Ordered 5 mg daily and will follow up with telehealth in 1 month.

## 2022-10-11 NOTE — Addendum Note (Signed)
Addended by: Burnell Blanks on: 10/11/2022 04:19 PM   Modules accepted: Level of Service

## 2022-10-11 NOTE — Assessment & Plan Note (Signed)
Patient presents for follow-up of his COPD. Recently admitted for COPD exacerbation in the setting of multifocal pneumonia.  Received bronchodilators and IV antibiotics and his symptoms improved and he was felt safe for discharge on home 5 L O2. He is currently on Trelegy Ellipta, albuterol, DuoNebs, and chronic prednisone.  He is followed by pulmonology outpatient.  He is on 5 L at baseline.  Today he is satting at 90%, lungs clear on auscultation.  He endorses baseline shortness of breath and cough.  Denies fevers or chills.   -Continue Trelegy Ellipta, DuoNebs, prednisone -Continue oxygen supplementation with goal 88 to 92%

## 2022-10-11 NOTE — Assessment & Plan Note (Signed)
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  He has had urinary retention due to enlarged prostate and had a Foley catheter placed.  It was difficult to discern whether or not his hematuria was a result of traumatic Foley or infection patient was treated with antibiotics and also underwent CBI. His hematuria subsided and he was felt stable to discharge with hemoglobin 7.9 and close follow-up with urology.  He does endorse some fatigue today but denies any persistent hematuria.  His Foley has been removed and he endorses regular voiding with good volume.  I suspect his hemoglobin will have improved given resolution of hematuria but will repeat CBC nonetheless. -Repeat CBC -Follow-up with urology tomorrow

## 2022-10-11 NOTE — Progress Notes (Addendum)
CC: Hospital follow-up  HPI:  [name] is a 67 y.o. male with past medical history as below who presents for hospital follow-up.  Please see detailed assessment and plan for HPI.  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  Physical Exam:  Vitals:   10/11/22 1425  BP: 122/60  Pulse: 94  Temp: 98.8 F (37.1 C)  TempSrc: Oral  SpO2: 90%  Weight: 140 lb 8 oz (63.7 kg)  Height: 5\' 2"  (1.575 m)   Physical Exam Constitutional:      General: He is not in acute distress.    Comments: 5L O2 via [place]  HENT:     Head: Normocephalic and atraumatic.  Eyes:     Extraocular Movements: Extraocular movements intact.  Cardiovascular:     Rate and Rhythm: Normal rate and regular rhythm.     Heart sounds: No murmur heard. Pulmonary:     Effort: Pulmonary effort is normal.     Breath sounds: No wheezing or rales.  Abdominal:     Palpations: Abdomen is soft.  Musculoskeletal:     Cervical back: Neck supple.     Right lower leg: No edema.     Left lower leg: No edema.  Skin:    General: Skin is warm and dry.  Neurological:     Mental Status: He is alert and oriented to person, place, and time.  Psychiatric:        Mood and Affect: Mood normal.        Behavior: Behavior normal.      Assessment & Plan:   See Encounters Tab for problem based charting.  COPD (chronic obstructive pulmonary disease) (HCC) Patient presents for  follow-up of his COPD. Recently admitted for COPD exacerbation in the setting of multifocal pneumonia.  Received bronchodilators and IV antibiotics and his symptoms improved and he was felt safe for discharge on home 5 L O2. He is currently on Trelegy Ellipta, albuterol, DuoNebs, and chronic prednisone.  He is followed by pulmonology outpatient.  He is on 5 L at baseline.  Today he is satting at 90%, lungs clear on auscultation.  He endorses baseline shortness of breath and cough.  Denies fevers or chills.   -Continue Trelegy Ellipta, DuoNebs, prednisone -Continue oxygen supplementation with goal 88 to 92%  Hematuria Patient here for follow-up of recent admission for hematuria in the setting of prostatic adenocarcinoma and possible traumatic Foley catheter.  He has had urinary retention due to enlarged prostate and had a Foley catheter placed.  It was difficult to discern whether or not his hematuria was a result of traumatic Foley or infection patient was treated with antibiotics and also underwent CBI. His hematuria subsided and he was felt stable to discharge with hemoglobin 7.9 and close follow-up with urology.  He does endorse some fatigue today but denies any persistent hematuria.  His Foley has been  removed and he endorses regular voiding with good volume.  I suspect his hemoglobin will have improved given resolution of hematuria but will repeat CBC nonetheless. -Repeat CBC -Follow-up with urology tomorrow  Anxiety Patient reports anxiety related to occasional bouts of difficulty breathing in the setting of his COPD.  He denies SI, HI.  Denies frank panic attacks.  We discussed the possible benefits of SSRI therapy.  He would like to think about this and follow-up in the future.  I will schedule him for follow-up in 3 months but instructed him to call us sooner if his symptoms worsen and/or he feels its time to start therapy.  Also discussed possible benefit of counseling but he is not interested in  this at this time. -Follow-up at next visit and to start SSRI if felt appropriate  Addendum: Discussed with patient over phone and they wish to begin Lexapro. Ordered 5 mg daily and will follow up with telehealth in 1 month.  Patient discussed with Dr. Antony Contras

## 2022-10-12 DIAGNOSIS — C61 Malignant neoplasm of prostate: Secondary | ICD-10-CM | POA: Diagnosis not present

## 2022-10-12 LAB — CBC
Hematocrit: 30.5 % — ABNORMAL LOW (ref 37.5–51.0)
Hemoglobin: 9.7 g/dL — ABNORMAL LOW (ref 13.0–17.7)
MCH: 29.4 pg (ref 26.6–33.0)
MCHC: 31.8 g/dL (ref 31.5–35.7)
MCV: 92 fL (ref 79–97)
Platelets: 347 10*3/uL (ref 150–450)
RBC: 3.3 x10E6/uL — ABNORMAL LOW (ref 4.14–5.80)
RDW: 12.8 % (ref 11.6–15.4)
WBC: 9 10*3/uL (ref 3.4–10.8)

## 2022-10-13 NOTE — Telephone Encounter (Signed)
Yes - standard mucinex (not DM) 600mg  bid

## 2022-10-13 NOTE — Telephone Encounter (Signed)
Called pt and left detailed message bout using mucinex. Nothing further needed.

## 2022-10-18 DIAGNOSIS — J9811 Atelectasis: Secondary | ICD-10-CM | POA: Diagnosis not present

## 2022-10-18 DIAGNOSIS — J9622 Acute and chronic respiratory failure with hypercapnia: Secondary | ICD-10-CM | POA: Diagnosis not present

## 2022-10-18 DIAGNOSIS — J449 Chronic obstructive pulmonary disease, unspecified: Secondary | ICD-10-CM | POA: Diagnosis not present

## 2022-10-18 DIAGNOSIS — D62 Acute posthemorrhagic anemia: Secondary | ICD-10-CM | POA: Diagnosis not present

## 2022-10-18 DIAGNOSIS — Z7952 Long term (current) use of systemic steroids: Secondary | ICD-10-CM | POA: Diagnosis not present

## 2022-10-18 DIAGNOSIS — Z7951 Long term (current) use of inhaled steroids: Secondary | ICD-10-CM | POA: Diagnosis not present

## 2022-10-18 DIAGNOSIS — M1990 Unspecified osteoarthritis, unspecified site: Secondary | ICD-10-CM | POA: Diagnosis not present

## 2022-10-18 DIAGNOSIS — J439 Emphysema, unspecified: Secondary | ICD-10-CM | POA: Diagnosis not present

## 2022-10-18 DIAGNOSIS — J9621 Acute and chronic respiratory failure with hypoxia: Secondary | ICD-10-CM | POA: Diagnosis not present

## 2022-10-18 DIAGNOSIS — N281 Cyst of kidney, acquired: Secondary | ICD-10-CM | POA: Diagnosis not present

## 2022-10-18 DIAGNOSIS — C7951 Secondary malignant neoplasm of bone: Secondary | ICD-10-CM | POA: Diagnosis not present

## 2022-10-18 DIAGNOSIS — C61 Malignant neoplasm of prostate: Secondary | ICD-10-CM | POA: Diagnosis not present

## 2022-10-18 DIAGNOSIS — N400 Enlarged prostate without lower urinary tract symptoms: Secondary | ICD-10-CM | POA: Diagnosis not present

## 2022-10-18 DIAGNOSIS — Z466 Encounter for fitting and adjustment of urinary device: Secondary | ICD-10-CM | POA: Diagnosis not present

## 2022-10-18 DIAGNOSIS — Z9981 Dependence on supplemental oxygen: Secondary | ICD-10-CM | POA: Diagnosis not present

## 2022-10-18 DIAGNOSIS — D630 Anemia in neoplastic disease: Secondary | ICD-10-CM | POA: Diagnosis not present

## 2022-10-20 ENCOUNTER — Ambulatory Visit: Payer: Self-pay

## 2022-10-20 DIAGNOSIS — N281 Cyst of kidney, acquired: Secondary | ICD-10-CM | POA: Diagnosis not present

## 2022-10-20 DIAGNOSIS — Z466 Encounter for fitting and adjustment of urinary device: Secondary | ICD-10-CM | POA: Diagnosis not present

## 2022-10-20 DIAGNOSIS — J9811 Atelectasis: Secondary | ICD-10-CM | POA: Diagnosis not present

## 2022-10-20 DIAGNOSIS — M1990 Unspecified osteoarthritis, unspecified site: Secondary | ICD-10-CM | POA: Diagnosis not present

## 2022-10-20 DIAGNOSIS — J449 Chronic obstructive pulmonary disease, unspecified: Secondary | ICD-10-CM | POA: Diagnosis not present

## 2022-10-20 DIAGNOSIS — N400 Enlarged prostate without lower urinary tract symptoms: Secondary | ICD-10-CM | POA: Diagnosis not present

## 2022-10-20 DIAGNOSIS — C7951 Secondary malignant neoplasm of bone: Secondary | ICD-10-CM | POA: Diagnosis not present

## 2022-10-20 DIAGNOSIS — J439 Emphysema, unspecified: Secondary | ICD-10-CM | POA: Diagnosis not present

## 2022-10-20 DIAGNOSIS — Z7952 Long term (current) use of systemic steroids: Secondary | ICD-10-CM | POA: Diagnosis not present

## 2022-10-20 DIAGNOSIS — D62 Acute posthemorrhagic anemia: Secondary | ICD-10-CM | POA: Diagnosis not present

## 2022-10-20 DIAGNOSIS — C61 Malignant neoplasm of prostate: Secondary | ICD-10-CM | POA: Diagnosis not present

## 2022-10-20 DIAGNOSIS — D630 Anemia in neoplastic disease: Secondary | ICD-10-CM | POA: Diagnosis not present

## 2022-10-20 DIAGNOSIS — J9622 Acute and chronic respiratory failure with hypercapnia: Secondary | ICD-10-CM | POA: Diagnosis not present

## 2022-10-20 DIAGNOSIS — J9621 Acute and chronic respiratory failure with hypoxia: Secondary | ICD-10-CM | POA: Diagnosis not present

## 2022-10-20 DIAGNOSIS — Z7951 Long term (current) use of inhaled steroids: Secondary | ICD-10-CM | POA: Diagnosis not present

## 2022-10-20 DIAGNOSIS — Z9981 Dependence on supplemental oxygen: Secondary | ICD-10-CM | POA: Diagnosis not present

## 2022-10-20 NOTE — Patient Instructions (Signed)
Visit Information  Thank you for taking time to visit with me today. Please don't hesitate to contact me if I can be of assistance to you.   Following are the goals we discussed today:   Goals Addressed             This Visit's Progress    I will monitor and control my COPD the best I can       Patient Goals/Self Care Activities: -Patient/Caregiver will self-administer medications as prescribed as evidenced by self-report/primary caregiver report  -Patient/Caregiver will attend all scheduled provider appointments as evidenced by clinician review of documented attendance to scheduled appointments and patient/caregiver report -Patient/Caregiver will call pharmacy for medication refills as evidenced by patient report and review of pharmacy fill history as appropriate -Patient/Caregiver will call provider office for new concerns or questions as evidenced by review of documented incoming telephone call notes and patient report -Patient/Caregiver verbalizes understanding of plan -Patient/Caregiver will focus on medication adherence by taking medications as prescribed  Provided patient with basic written and verbal COPD education on self care/management/and exacerbation prevention Advised patient to track and manage COPD triggers Advised patient to self assesses COPD action plan zone and make appointment with provider if in the yellow zone for 48 hours without improvement -Think about wearing a mask to avoid pollen Discussed the importance of adequate rest and management of fatigue with COPD Active listening / Reflection utilized  Emotional Support Provided Problem Solving /Task Center strategies reviewed         Our next appointment is by telephone on 11/24/22 at 1 pm  Please call the care guide team at [phone] if you need to cancel or reschedule your appointment.   If you are experiencing a Mental Health or Behavioral Health Crisis or need someone to talk to, please call  1-800-273-TALK (toll free, 24 hour hotline)  Patient verbalizes understanding of instructions and care plan provided today and agrees to view in MyChart. Active MyChart status and patient understanding of how to access instructions and care plan via MyChart confirmed with patient.     Juanell Fairly RN, BSN, [facility] Care Coordinator [facility]   Phone: [phone]

## 2022-10-20 NOTE — Patient Outreach (Signed)
  Care Coordination   Follow Up Visit Note   10/20/2022 Name: Frank [name] MRN: [id number] DOB: [birth year]  Frank [name] is a 67 y.o. year old male who sees Crissie Sickles, MD for primary care. I spoke with  Frank [name] by phone today.  What matters to the patients health and wellness today?  Frank [name], Frank [name] sister, shared that he is managing his breathing, but it's not without its challenges. During our conversation, Frank [name] expressed his struggle with the heat and humidity pollen, which often leaves him feeling up and down. I suggested the use of a mask when he ventures outside, understanding that it's not his preferred choice.   Frank [name] informed me that during their visit on 10/11/22, they had a discussion with Dr. Cliffton Asters about potential antidepressant options for Frank [name]. They would like for Dr. Cliffton Asters to consider this and would greatly appreciate if he could write a prescription and follow up with them on this matter.     Goals Addressed             This Visit's Progress    I will monitor and control my COPD the best I can       [name] Goals/Self Care Activities: -[name] will self-administer medications as prescribed as evidenced by self-report/primary caregiver report  -[name] will attend all scheduled provider appointments as evidenced by clinician review of documented attendance to scheduled appointments and [name] report -[name] will call pharmacy for medication refills as evidenced by [name] report and review of pharmacy fill history as appropriate -[name] will call provider office for new concerns or questions as evidenced by review of documented incoming telephone call notes and [name] report -[name] verbalizes understanding of plan -[name] will focus on medication adherence by taking medications as prescribed  Provided [name] with basic written and verbal COPD education on self  care/management/and exacerbation prevention Advised [name] to track and manage COPD triggers Advised [name] to self assesses COPD action plan zone and make appointment with provider if in the yellow zone for 48 hours without improvement -Think about wearing a mask to avoid pollen Discussed the importance of adequate rest and management of fatigue with COPD Active listening / Reflection utilized  Emotional Support Provided Problem Solving /Task Center strategies reviewed         SDOH assessments and interventions completed:  No     Care Coordination Interventions:  Yes, provided   Interventions Today    Flowsheet Row Most Recent Value  Chronic Disease   Chronic disease during today's visit Chronic Obstructive Pulmonary Disease (COPD)  General Interventions   General Interventions Discussed/Reviewed General Interventions Discussed, General Interventions Reviewed  Education Interventions   Education Provided Provided Education  Pharmacy Interventions   Pharmacy Dicussed/Reviewed Pharmacy Topics Discussed  Safety Interventions   Safety Discussed/Reviewed Safety Discussed        Follow up plan: Follow up call scheduled for 11/24/22  1 pm    Encounter Outcome:  Pt. Visit Completed   Juanell Fairly RN, BSN, [facility] Care Coordinator [facility]   Phone: [phone]

## 2022-10-23 ENCOUNTER — Other Ambulatory Visit (HOSPITAL_COMMUNITY): Payer: Self-pay

## 2022-10-23 DIAGNOSIS — Z9981 Dependence on supplemental oxygen: Secondary | ICD-10-CM | POA: Diagnosis not present

## 2022-10-23 DIAGNOSIS — J449 Chronic obstructive pulmonary disease, unspecified: Secondary | ICD-10-CM | POA: Diagnosis not present

## 2022-10-23 DIAGNOSIS — Z466 Encounter for fitting and adjustment of urinary device: Secondary | ICD-10-CM | POA: Diagnosis not present

## 2022-10-23 DIAGNOSIS — D62 Acute posthemorrhagic anemia: Secondary | ICD-10-CM | POA: Diagnosis not present

## 2022-10-23 DIAGNOSIS — J439 Emphysema, unspecified: Secondary | ICD-10-CM | POA: Diagnosis not present

## 2022-10-23 DIAGNOSIS — N281 Cyst of kidney, acquired: Secondary | ICD-10-CM | POA: Diagnosis not present

## 2022-10-23 DIAGNOSIS — N400 Enlarged prostate without lower urinary tract symptoms: Secondary | ICD-10-CM | POA: Diagnosis not present

## 2022-10-23 DIAGNOSIS — C7951 Secondary malignant neoplasm of bone: Secondary | ICD-10-CM | POA: Diagnosis not present

## 2022-10-23 DIAGNOSIS — J9621 Acute and chronic respiratory failure with hypoxia: Secondary | ICD-10-CM | POA: Diagnosis not present

## 2022-10-23 DIAGNOSIS — C61 Malignant neoplasm of prostate: Secondary | ICD-10-CM | POA: Diagnosis not present

## 2022-10-23 DIAGNOSIS — J9622 Acute and chronic respiratory failure with hypercapnia: Secondary | ICD-10-CM | POA: Diagnosis not present

## 2022-10-23 DIAGNOSIS — M1990 Unspecified osteoarthritis, unspecified site: Secondary | ICD-10-CM | POA: Diagnosis not present

## 2022-10-23 DIAGNOSIS — J9811 Atelectasis: Secondary | ICD-10-CM | POA: Diagnosis not present

## 2022-10-23 DIAGNOSIS — D630 Anemia in neoplastic disease: Secondary | ICD-10-CM | POA: Diagnosis not present

## 2022-10-23 DIAGNOSIS — Z7951 Long term (current) use of inhaled steroids: Secondary | ICD-10-CM | POA: Diagnosis not present

## 2022-10-23 DIAGNOSIS — Z7952 Long term (current) use of systemic steroids: Secondary | ICD-10-CM | POA: Diagnosis not present

## 2022-10-23 MED ORDER — ESCITALOPRAM OXALATE 5 MG PO TABS
5.0000 mg | ORAL_TABLET | Freq: Every day | ORAL | 0 refills | Status: DC
Start: 2022-10-23 — End: 2022-11-15

## 2022-10-23 NOTE — Addendum Note (Signed)
Addended by: Adron Bene on: 10/23/2022 01:16 PM   Modules accepted: Orders

## 2022-10-26 DIAGNOSIS — J439 Emphysema, unspecified: Secondary | ICD-10-CM | POA: Diagnosis not present

## 2022-10-26 DIAGNOSIS — C7951 Secondary malignant neoplasm of bone: Secondary | ICD-10-CM | POA: Diagnosis not present

## 2022-10-26 DIAGNOSIS — J9622 Acute and chronic respiratory failure with hypercapnia: Secondary | ICD-10-CM | POA: Diagnosis not present

## 2022-10-26 DIAGNOSIS — N281 Cyst of kidney, acquired: Secondary | ICD-10-CM | POA: Diagnosis not present

## 2022-10-26 DIAGNOSIS — Z7952 Long term (current) use of systemic steroids: Secondary | ICD-10-CM | POA: Diagnosis not present

## 2022-10-26 DIAGNOSIS — J449 Chronic obstructive pulmonary disease, unspecified: Secondary | ICD-10-CM | POA: Diagnosis not present

## 2022-10-26 DIAGNOSIS — J9621 Acute and chronic respiratory failure with hypoxia: Secondary | ICD-10-CM | POA: Diagnosis not present

## 2022-10-26 DIAGNOSIS — Z9981 Dependence on supplemental oxygen: Secondary | ICD-10-CM | POA: Diagnosis not present

## 2022-10-26 DIAGNOSIS — D62 Acute posthemorrhagic anemia: Secondary | ICD-10-CM | POA: Diagnosis not present

## 2022-10-26 DIAGNOSIS — J9811 Atelectasis: Secondary | ICD-10-CM | POA: Diagnosis not present

## 2022-10-26 DIAGNOSIS — Z7951 Long term (current) use of inhaled steroids: Secondary | ICD-10-CM | POA: Diagnosis not present

## 2022-10-26 DIAGNOSIS — M1990 Unspecified osteoarthritis, unspecified site: Secondary | ICD-10-CM | POA: Diagnosis not present

## 2022-10-26 DIAGNOSIS — D630 Anemia in neoplastic disease: Secondary | ICD-10-CM | POA: Diagnosis not present

## 2022-10-26 DIAGNOSIS — Z466 Encounter for fitting and adjustment of urinary device: Secondary | ICD-10-CM | POA: Diagnosis not present

## 2022-10-26 DIAGNOSIS — N400 Enlarged prostate without lower urinary tract symptoms: Secondary | ICD-10-CM | POA: Diagnosis not present

## 2022-10-26 DIAGNOSIS — C61 Malignant neoplasm of prostate: Secondary | ICD-10-CM | POA: Diagnosis not present

## 2022-10-27 ENCOUNTER — Telehealth: Payer: Self-pay | Admitting: Emergency Medicine

## 2022-10-27 DIAGNOSIS — J9622 Acute and chronic respiratory failure with hypercapnia: Secondary | ICD-10-CM | POA: Diagnosis not present

## 2022-10-27 DIAGNOSIS — Z7951 Long term (current) use of inhaled steroids: Secondary | ICD-10-CM | POA: Diagnosis not present

## 2022-10-27 DIAGNOSIS — Z9981 Dependence on supplemental oxygen: Secondary | ICD-10-CM | POA: Diagnosis not present

## 2022-10-27 DIAGNOSIS — Z7952 Long term (current) use of systemic steroids: Secondary | ICD-10-CM | POA: Diagnosis not present

## 2022-10-27 DIAGNOSIS — N281 Cyst of kidney, acquired: Secondary | ICD-10-CM | POA: Diagnosis not present

## 2022-10-27 DIAGNOSIS — J449 Chronic obstructive pulmonary disease, unspecified: Secondary | ICD-10-CM | POA: Diagnosis not present

## 2022-10-27 DIAGNOSIS — J9621 Acute and chronic respiratory failure with hypoxia: Secondary | ICD-10-CM | POA: Diagnosis not present

## 2022-10-27 DIAGNOSIS — D62 Acute posthemorrhagic anemia: Secondary | ICD-10-CM | POA: Diagnosis not present

## 2022-10-27 DIAGNOSIS — C61 Malignant neoplasm of prostate: Secondary | ICD-10-CM | POA: Diagnosis not present

## 2022-10-27 DIAGNOSIS — J439 Emphysema, unspecified: Secondary | ICD-10-CM | POA: Diagnosis not present

## 2022-10-27 DIAGNOSIS — J9811 Atelectasis: Secondary | ICD-10-CM | POA: Diagnosis not present

## 2022-10-27 DIAGNOSIS — N400 Enlarged prostate without lower urinary tract symptoms: Secondary | ICD-10-CM | POA: Diagnosis not present

## 2022-10-27 DIAGNOSIS — M1990 Unspecified osteoarthritis, unspecified site: Secondary | ICD-10-CM | POA: Diagnosis not present

## 2022-10-27 DIAGNOSIS — Z466 Encounter for fitting and adjustment of urinary device: Secondary | ICD-10-CM | POA: Diagnosis not present

## 2022-10-27 DIAGNOSIS — D630 Anemia in neoplastic disease: Secondary | ICD-10-CM | POA: Diagnosis not present

## 2022-10-27 DIAGNOSIS — C7951 Secondary malignant neoplasm of bone: Secondary | ICD-10-CM | POA: Diagnosis not present

## 2022-10-27 NOTE — Telephone Encounter (Signed)
Pt. Sister calling needs med advise pt. Was blowing nose a huge clump of mucus came out and needs to know what to do please advise

## 2022-10-27 NOTE — Telephone Encounter (Signed)
Spoke with patient regarding prior message . Patient stated he was able to blow mucus out of nose and can breath better. Patient stated no fever chills or body aches.Patient was advised to use nasal spray and to call our office back if any changes.  Patient's voice was understanding.   Nothing else further needed.

## 2022-11-01 DIAGNOSIS — M1990 Unspecified osteoarthritis, unspecified site: Secondary | ICD-10-CM | POA: Diagnosis not present

## 2022-11-01 DIAGNOSIS — D630 Anemia in neoplastic disease: Secondary | ICD-10-CM | POA: Diagnosis not present

## 2022-11-01 DIAGNOSIS — Z9981 Dependence on supplemental oxygen: Secondary | ICD-10-CM | POA: Diagnosis not present

## 2022-11-01 DIAGNOSIS — Z7952 Long term (current) use of systemic steroids: Secondary | ICD-10-CM | POA: Diagnosis not present

## 2022-11-01 DIAGNOSIS — J439 Emphysema, unspecified: Secondary | ICD-10-CM | POA: Diagnosis not present

## 2022-11-01 DIAGNOSIS — J449 Chronic obstructive pulmonary disease, unspecified: Secondary | ICD-10-CM | POA: Diagnosis not present

## 2022-11-01 DIAGNOSIS — J9621 Acute and chronic respiratory failure with hypoxia: Secondary | ICD-10-CM | POA: Diagnosis not present

## 2022-11-01 DIAGNOSIS — N281 Cyst of kidney, acquired: Secondary | ICD-10-CM | POA: Diagnosis not present

## 2022-11-01 DIAGNOSIS — J9622 Acute and chronic respiratory failure with hypercapnia: Secondary | ICD-10-CM | POA: Diagnosis not present

## 2022-11-01 DIAGNOSIS — Z466 Encounter for fitting and adjustment of urinary device: Secondary | ICD-10-CM | POA: Diagnosis not present

## 2022-11-01 DIAGNOSIS — Z7951 Long term (current) use of inhaled steroids: Secondary | ICD-10-CM | POA: Diagnosis not present

## 2022-11-01 DIAGNOSIS — C61 Malignant neoplasm of prostate: Secondary | ICD-10-CM | POA: Diagnosis not present

## 2022-11-01 DIAGNOSIS — C7951 Secondary malignant neoplasm of bone: Secondary | ICD-10-CM | POA: Diagnosis not present

## 2022-11-01 DIAGNOSIS — D62 Acute posthemorrhagic anemia: Secondary | ICD-10-CM | POA: Diagnosis not present

## 2022-11-01 DIAGNOSIS — J9811 Atelectasis: Secondary | ICD-10-CM | POA: Diagnosis not present

## 2022-11-01 DIAGNOSIS — N400 Enlarged prostate without lower urinary tract symptoms: Secondary | ICD-10-CM | POA: Diagnosis not present

## 2022-11-02 DIAGNOSIS — J449 Chronic obstructive pulmonary disease, unspecified: Secondary | ICD-10-CM | POA: Diagnosis not present

## 2022-11-05 DIAGNOSIS — U071 COVID-19: Secondary | ICD-10-CM | POA: Diagnosis not present

## 2022-11-05 DIAGNOSIS — J449 Chronic obstructive pulmonary disease, unspecified: Secondary | ICD-10-CM | POA: Diagnosis not present

## 2022-11-07 DIAGNOSIS — J439 Emphysema, unspecified: Secondary | ICD-10-CM | POA: Diagnosis not present

## 2022-11-07 DIAGNOSIS — C7951 Secondary malignant neoplasm of bone: Secondary | ICD-10-CM | POA: Diagnosis not present

## 2022-11-07 DIAGNOSIS — J449 Chronic obstructive pulmonary disease, unspecified: Secondary | ICD-10-CM | POA: Diagnosis not present

## 2022-11-07 DIAGNOSIS — Z9981 Dependence on supplemental oxygen: Secondary | ICD-10-CM | POA: Diagnosis not present

## 2022-11-07 DIAGNOSIS — N281 Cyst of kidney, acquired: Secondary | ICD-10-CM | POA: Diagnosis not present

## 2022-11-07 DIAGNOSIS — M1990 Unspecified osteoarthritis, unspecified site: Secondary | ICD-10-CM | POA: Diagnosis not present

## 2022-11-07 DIAGNOSIS — J9622 Acute and chronic respiratory failure with hypercapnia: Secondary | ICD-10-CM | POA: Diagnosis not present

## 2022-11-07 DIAGNOSIS — Z466 Encounter for fitting and adjustment of urinary device: Secondary | ICD-10-CM | POA: Diagnosis not present

## 2022-11-07 DIAGNOSIS — Z7952 Long term (current) use of systemic steroids: Secondary | ICD-10-CM | POA: Diagnosis not present

## 2022-11-07 DIAGNOSIS — D630 Anemia in neoplastic disease: Secondary | ICD-10-CM | POA: Diagnosis not present

## 2022-11-07 DIAGNOSIS — N400 Enlarged prostate without lower urinary tract symptoms: Secondary | ICD-10-CM | POA: Diagnosis not present

## 2022-11-07 DIAGNOSIS — D62 Acute posthemorrhagic anemia: Secondary | ICD-10-CM | POA: Diagnosis not present

## 2022-11-07 DIAGNOSIS — J9811 Atelectasis: Secondary | ICD-10-CM | POA: Diagnosis not present

## 2022-11-07 DIAGNOSIS — Z7951 Long term (current) use of inhaled steroids: Secondary | ICD-10-CM | POA: Diagnosis not present

## 2022-11-07 DIAGNOSIS — J9621 Acute and chronic respiratory failure with hypoxia: Secondary | ICD-10-CM | POA: Diagnosis not present

## 2022-11-07 DIAGNOSIS — C61 Malignant neoplasm of prostate: Secondary | ICD-10-CM | POA: Diagnosis not present

## 2022-11-15 ENCOUNTER — Ambulatory Visit (INDEPENDENT_AMBULATORY_CARE_PROVIDER_SITE_OTHER): Payer: Medicare Other | Admitting: Internal Medicine

## 2022-11-15 DIAGNOSIS — D630 Anemia in neoplastic disease: Secondary | ICD-10-CM | POA: Diagnosis not present

## 2022-11-15 DIAGNOSIS — Z9981 Dependence on supplemental oxygen: Secondary | ICD-10-CM | POA: Diagnosis not present

## 2022-11-15 DIAGNOSIS — J9621 Acute and chronic respiratory failure with hypoxia: Secondary | ICD-10-CM | POA: Diagnosis not present

## 2022-11-15 DIAGNOSIS — J449 Chronic obstructive pulmonary disease, unspecified: Secondary | ICD-10-CM | POA: Diagnosis not present

## 2022-11-15 DIAGNOSIS — N400 Enlarged prostate without lower urinary tract symptoms: Secondary | ICD-10-CM | POA: Diagnosis not present

## 2022-11-15 DIAGNOSIS — J9811 Atelectasis: Secondary | ICD-10-CM | POA: Diagnosis not present

## 2022-11-15 DIAGNOSIS — M1990 Unspecified osteoarthritis, unspecified site: Secondary | ICD-10-CM | POA: Diagnosis not present

## 2022-11-15 DIAGNOSIS — C61 Malignant neoplasm of prostate: Secondary | ICD-10-CM | POA: Diagnosis not present

## 2022-11-15 DIAGNOSIS — C7951 Secondary malignant neoplasm of bone: Secondary | ICD-10-CM | POA: Diagnosis not present

## 2022-11-15 DIAGNOSIS — F419 Anxiety disorder, unspecified: Secondary | ICD-10-CM

## 2022-11-15 DIAGNOSIS — Z7951 Long term (current) use of inhaled steroids: Secondary | ICD-10-CM | POA: Diagnosis not present

## 2022-11-15 DIAGNOSIS — D62 Acute posthemorrhagic anemia: Secondary | ICD-10-CM | POA: Diagnosis not present

## 2022-11-15 DIAGNOSIS — N281 Cyst of kidney, acquired: Secondary | ICD-10-CM | POA: Diagnosis not present

## 2022-11-15 DIAGNOSIS — J439 Emphysema, unspecified: Secondary | ICD-10-CM | POA: Diagnosis not present

## 2022-11-15 DIAGNOSIS — J9622 Acute and chronic respiratory failure with hypercapnia: Secondary | ICD-10-CM | POA: Diagnosis not present

## 2022-11-15 DIAGNOSIS — Z7952 Long term (current) use of systemic steroids: Secondary | ICD-10-CM | POA: Diagnosis not present

## 2022-11-15 DIAGNOSIS — Z466 Encounter for fitting and adjustment of urinary device: Secondary | ICD-10-CM | POA: Diagnosis not present

## 2022-11-15 MED ORDER — ESCITALOPRAM OXALATE 5 MG PO TABS: 5.0000 mg | ORAL_TABLET | Freq: Every day | ORAL | 11 refills | Status: DC

## 2022-11-15 NOTE — Progress Notes (Signed)
    I connected with  Frank [name] on 11/16/22 by telephone and verified that I am speaking with the correct person using two identifiers.   I discussed the limitations of evaluation and management by telemedicine. The [name] expressed understanding and agreed to proceed.  CC: anxiety  This is a telephone encounter between Frank [name] and [employer] on 11/16/2022 for follow-up on anxiety. The visit was conducted with the [name] located at home and Rudene Christians at [facility]. The [name]'s identity was confirmed using their DOB and current address. The [name] has consented to being evaluated through a telephone encounter and understands the associated risks (an examination cannot be done and the [name] may need to come in for an appointment) / benefits (allows the [name] to remain at home, decreasing exposure to coronavirus). I personally spent 8 minutes on medical discussion.   HPI:  [name] is a 67 y.o. with PMH of metastatic prostate cancer and end stage COPD.  Please see A&P for assessment of the [name]'s acute and chronic medical conditions.   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 Since starting medication he feels that he is sleeping better. He continues to have anxiety related to shortness of breath. He is able to complete most ADLs on own but  does have dyspnea with activities that require him to stand for long periods such as washing dishes at the sink. P: Continue lexapro He was seen by SW with [facility] a few months ago however RN visit was canceled. He was unsure about why. I called and talked with his sister at request of [name]. Her sister states that [name] was sick on scheduled day with palliative. I do think he would benefit from their support, his sister will call and help get follow-up for him.    [name] discussed with Dr. Laney Pastor Cheyeanne Roadcap, D.O. [facility] Internal [facility]  PGY-2 Pager: [phone]  Phone: [phone] Date 11/16/2022  Time 8:19 AM

## 2022-11-15 NOTE — Patient Instructions (Addendum)
Thank you, [name] for allowing [place] to provide your care today.   I am glad to hear that your symptoms are improved with lexapro and that your are sleeping better. I think that your may benefit from another medication that would help with your feelings of shortness of breath with activity. I have sent a message to the nurse with palliative medicine to see if they could follow-up with you about that.   I have ordered the following medication/changed the following medications:   Stop the following medications: Medications Discontinued During This Encounter  Medication Reason   escitalopram (LEXAPRO) 5 MG tablet Reorder     Start the following medications: Meds ordered this encounter  Medications   escitalopram (LEXAPRO) 5 MG tablet    Sig: Take 1 tablet (5 mg total) by mouth daily.    Dispense:  30 tablet    Refill:  11    We look forward to seeing you next time. Please call our clinic at [phone] if you have any questions or concerns. The best time to call is Monday-Friday from 9am-4pm, but there is someone available 24/7. If after hours or the weekend, call the main hospital number and ask for the Internal Medicine Resident On-Call. If you need medication refills, please notify your pharmacy one week in advance and they will send [place] a request.   Thank you for trusting me with your care. Wishing you the best!   Rudene Christians, DO [facility]

## 2022-11-16 NOTE — Assessment & Plan Note (Signed)
He started taking lexapro about 2 weeks ago. Since starting medication he feels that he is sleeping better. He continues to have anxiety related to shortness of breath. He is able to complete most ADLs on own but does have dyspnea with activities that require him to stand for long periods such as washing dishes at the sink. P: Continue lexapro He was seen by SW with [facility] a few months ago however RN visit was canceled. He was unsure about why. I called and talked with his sister at request of patient. Her sister states that patient was sick on scheduled day with palliative. I do think he would benefit from their support, his sister will call and help get follow-up for him.

## 2022-11-21 NOTE — Progress Notes (Signed)
 Internal Medicine Clinic Attending  Case discussed with the resident at the time of the visit.  We reviewed the resident's history and exam and pertinent patient test results.  I agree with the assessment, diagnosis, and plan of care documented in the resident's note.  

## 2022-11-24 ENCOUNTER — Ambulatory Visit: Payer: Self-pay

## 2022-11-24 NOTE — Patient Outreach (Unsigned)
  Care Coordination   Follow Up Visit Note   11/24/2022 Name: Frank [name] MRN: [id number] DOB: [birth year]  Frank [name] is a 67 y.o. year old male who sees Carmina Miller, DO for primary care. I spoke with  [name] by phone today.  What matters to the patients health and wellness today?  ***    Goals Addressed   None     SDOH assessments and interventions completed:  {yes/no:20286}{THN Tip this will not be part of the note when signed-REQUIRED REPORT FIELD DO NOT DELETE (Optional):27901}     Care Coordination Interventions:  {INTERVENTIONS:27767} {THN Tip this will not be part of the note when signed-REQUIRED REPORT FIELD DO NOT DELETE (Optional):27901}  Follow up plan: {CCFOLLOWUP:27768}   Encounter Outcome:  {ENCOUTCOME:27770} {THN Tip this will not be part of the note when signed-REQUIRED REPORT FIELD DO NOT DELETE (Optional):27901}

## 2022-11-25 NOTE — Patient Instructions (Signed)
Visit Information  Thank you for taking time to visit with me today. Please don't hesitate to contact me if I can be of assistance to you.   Following are the goals we discussed today:   Goals Addressed             This Visit's Progress    I will monitor and control my COPD the best I can       Patient Goals/Self Care Activities: -Patient/Caregiver will self-administer medications as prescribed as evidenced by self-report/primary caregiver report  -Patient/Caregiver will attend all scheduled provider appointments as evidenced by clinician review of documented attendance to scheduled appointments and patient/caregiver report -Patient/Caregiver will call pharmacy for medication refills as evidenced by patient report and review of pharmacy fill history as appropriate -Patient/Caregiver will call provider office for new concerns or questions as evidenced by review of documented incoming telephone call notes and patient report -Patient/Caregiver verbalizes understanding of plan -Patient/Caregiver will focus on medication adherence by taking medications as prescribed  Provided patient with basic written and verbal COPD education on self care/management/and exacerbation prevention Advised patient to track and manage COPD triggers Advised patient to self assesses COPD action plan zone and make appointment with provider if in the yellow zone for 48 hours without improvement -Think about wearing a mask to avoid pollen Discussed the importance of adequate rest and management of fatigue with COPD Active listening / Reflection utilized  Emotional Support Provided Problem Solving /Task Center strategies reviewed Continue with your review Stay out of the heat Stay hydrated         Our next appointment is by telephone on 12/29/22 at 130 pm  Please call the care guide team at [phone] if you need to cancel or reschedule your appointment.   If you are experiencing a Mental Health or  Behavioral Health Crisis or need someone to talk to, please call 1-800-273-TALK (toll free, 24 hour hotline)  Patient verbalizes understanding of instructions and care plan provided today and agrees to view in MyChart. Active MyChart status and patient understanding of how to access instructions and care plan via MyChart confirmed with patient.     Juanell Fairly RN, BSN, [facility] Care Coordinator [facility]   Phone: [phone]

## 2022-11-26 ENCOUNTER — Other Ambulatory Visit: Payer: Self-pay | Admitting: Pulmonary Disease

## 2022-11-29 ENCOUNTER — Other Ambulatory Visit: Payer: Self-pay | Admitting: Emergency Medicine

## 2022-11-29 NOTE — Telephone Encounter (Signed)
Pt calling in for refill of Prednisone   Pharmacy: [id number] - [place], [place] - [address] AT [facility] [address] & [address]

## 2022-11-30 NOTE — Telephone Encounter (Signed)
Patient is calling again regarding his prescription.  The pharmacy still does not have his prescription.  Please advise and call patient to discuss at [phone]

## 2022-12-01 NOTE — Telephone Encounter (Signed)
PT calling for a 3rd time for his Pred. Please call in. TY.

## 2022-12-02 DIAGNOSIS — J449 Chronic obstructive pulmonary disease, unspecified: Secondary | ICD-10-CM | POA: Diagnosis not present

## 2022-12-04 MED ORDER — PREDNISONE 10 MG PO TABS: 10.0000 mg | ORAL_TABLET | Freq: Every day | ORAL | Status: DC

## 2022-12-04 NOTE — Telephone Encounter (Signed)
I refilled it for him. Thanks

## 2022-12-04 NOTE — Telephone Encounter (Signed)
Patient requesting refill for prednisone 10mg    LOV : 07/26/21  Patient does have a f/u on 12/06/22  Dr.[name] can you please advise it will be ok to send in a refill for patient. I did attempt to contact patient regarding message . LVM for patient to call our office back.

## 2022-12-04 NOTE — Telephone Encounter (Signed)
Called and left detailed message for [name] [facility]) letting her know message had been routed to Dr. Delton Coombes to advise on Prednisone refill.

## 2022-12-06 ENCOUNTER — Encounter: Payer: Self-pay | Admitting: Emergency Medicine

## 2022-12-06 ENCOUNTER — Ambulatory Visit: Payer: Medicare Other | Admitting: Emergency Medicine

## 2022-12-06 VITALS — BP 124/68 | HR 95 | Temp 98.6°F | Ht 64.0 in | Wt 144.4 lb

## 2022-12-06 DIAGNOSIS — J301 Allergic rhinitis due to pollen: Secondary | ICD-10-CM | POA: Diagnosis not present

## 2022-12-06 DIAGNOSIS — J9611 Chronic respiratory failure with hypoxia: Secondary | ICD-10-CM

## 2022-12-06 DIAGNOSIS — Z9981 Dependence on supplemental oxygen: Secondary | ICD-10-CM

## 2022-12-06 DIAGNOSIS — J449 Chronic obstructive pulmonary disease, unspecified: Secondary | ICD-10-CM | POA: Diagnosis not present

## 2022-12-06 MED ORDER — AZITHROMYCIN 250 MG PO TABS: 250.0000 mg | ORAL_TABLET | ORAL | 5 refills | Status: DC

## 2022-12-06 NOTE — Patient Instructions (Addendum)
Please continue your Trelegy 1 elation once daily.  Rinse and gargle after using. Keep albuterol available to use 1 nebulizer treatment or 2 puffs up to every 4 hours if needed for shortness of breath, chest tightness, wheezing. Continue prednisone 10 mg once daily. Start azithromycin 250 mg once daily.  We will continue this medication indefinitely Continue Mucinex and Flonase as needed Continue your oxygen at 4-6 l/min Agree with Palliative care evaluation this month as planned Follow with APP in 2 months Follow Dr. Delton Coombes in 6 months, sooner if you have problems.

## 2022-12-06 NOTE — Addendum Note (Signed)
Addended by: Christen Butter on: 12/06/2022 03:24 PM   Modules accepted: Orders

## 2022-12-06 NOTE — Progress Notes (Signed)
Subjective:    Patient ID: Frank [name], male    DOB: 21-Jul-1955, 67 y.o.   MRN: [id number]  COPD His past medical history is significant for COPD.    R OV 08/10/2020 --Frank [name] is 67 and follows up today for his chronic hypoxemic respiratory failure due to very severe COPD.  He had COVID-19 in October 2021.  He requires oxygen at 4 L/min.  We have tried him on chronic prednisone but he did not feel that it helped him much.  He is managed on Trelegy.  He uses his albuterol approximately 2x a day. He has had more exertional SOB for the last 2 weeks. ? Increased mucous from pollen but no wheeze or cough.  He is not taking flonase right now. He has restarted loratadine about 3 days ago.   ROV 07/26/21 --Frank [name] is a 67 with severe COPD and associated chronic hypoxemic respiratory failure.  He has allergic rhinitis with cough and nasal obstruction.  I last saw him as a virtual visit August 2022. Managed on Trelegy, chronic prednisone 10 mg daily, oxygen 4 L/min I treated him for a flare of his COPD about 2 weeks ago with a prednisone taper Today he reports that he has ups and downs w his breathing and functional capacity - has trouble w warmer weather / temperature extremes. He has episodes of head and chest congestion, significantly impacts his exercise tolerance. He is using mucinex prn. He uses flonase but not every day. Uses albuterol several times a week. He is up and around - uses wheelchair when he is out of the house.  Flu and COVID vaccines up to date  ROV 12/06/2022 --follow-up visit for severe COPD.  67 year old man with COPD, chronic hypoxemic respiratory failure, allergic rhinitis, cough and nasal obstruction.  He is on chronic prednisone 10 mg once daily.  Oxygen at 5 L/min.  Maintenance BD is Trelegy.  He uses Mucinex as needed, Flonase as needed.  Admitted for pneumonia in late May.  He has palliative care assistance.  Albuterol approximately 4x a day.  Today he reports that he has had  some good and bad days, often impacted by hot air. Does better in the [name]. He has some dry cough daily.  Palliative Care to be re-established in mid August.     PULMONARY FUNCTON TEST 08/30/2012  FVC 3.93  FEV1 1.69  FEV1/FVC 43  FVC  % Predicted 93  FEV % Predicted 55  FeF 25-75 .71  FeF 25-75 % Predicted 3.05    Review of Systems As per HPI     Objective:   Physical Exam Vitals:   12/06/22 1449  BP: 124/68  Pulse: 95  Temp: 98.6 F (37 C)  TempSrc: Oral  SpO2: 93%  Weight: 144 lb 6.4 oz (65.5 kg)  Height: 5\' 4"  (1.626 m)   Gen: Pleasant, well-nourished, in no distress,  normal affect  ENT: No lesions,  mouth clear,  oropharynx clear, no postnasal drip  Neck: No JVD, no stridor  Lungs: No use of accessory muscles, very distant, end expiratory wheezes bilaterally  Cardiovascular: RRR, heart sounds normal, no murmur or gallops, no peripheral edema  Musculoskeletal: No deformities, no cyanosis or clubbing  Neuro: alert, non focal  Skin: Warm, no lesions or rashes     Assessment & Plan:  COPD (chronic obstructive pulmonary disease) (HCC) Please continue your Trelegy 1 elation once daily.  Rinse and gargle after using. Keep albuterol available to use 1 nebulizer treatment or  2 puffs up to every 4 hours if needed for shortness of breath, chest tightness, wheezing. Continue prednisone 10 mg once daily. Start azithromycin 250 mg once daily.  We will continue this medication indefinitely Continue Mucinex and Flonase as needed Agree with Palliative care evaluation this month as planned Follow with APP in 2 months Follow Dr. Delton Coombes in 6 months, sooner if you have problems.    Chronic respiratory failure with hypoxia, on home O2 therapy (HCC) O2 at 4-6 L/min  Allergic rhinitis Flonase as needed.  He also does nasal saline rinses.  It does seem that at times part of his dyspnea and flaring relates to nasal obstruction with difficulty moving air, difficulty oxygen  delivery etc.   Levy Pupa, MD, PhD 12/06/2022, 3:17 PM [facility] [phone] or if no answer [phone]

## 2022-12-06 NOTE — Assessment & Plan Note (Signed)
Please continue your Trelegy 1 elation once daily.  Rinse and gargle after using. Keep albuterol available to use 1 nebulizer treatment or 2 puffs up to every 4 hours if needed for shortness of breath, chest tightness, wheezing. Continue prednisone 10 mg once daily. Start azithromycin 250 mg once daily.  We will continue this medication indefinitely Continue Mucinex and Flonase as needed Agree with Palliative care evaluation this month as planned Follow with APP in 2 months Follow Dr. Delton Coombes in 6 months, sooner if you have problems.

## 2022-12-06 NOTE — Assessment & Plan Note (Signed)
O2 at 4-6 L/min

## 2022-12-06 NOTE — Assessment & Plan Note (Signed)
Flonase as needed.  He also does nasal saline rinses.  It does seem that at times part of his dyspnea and flaring relates to nasal obstruction with difficulty moving air, difficulty oxygen delivery etc.

## 2022-12-07 ENCOUNTER — Other Ambulatory Visit: Payer: Self-pay | Admitting: *Deleted

## 2022-12-07 ENCOUNTER — Other Ambulatory Visit: Payer: Self-pay

## 2022-12-07 MED ORDER — PREDNISONE 10 MG PO TABS: 10.0000 mg | ORAL_TABLET | Freq: Every day | ORAL | 3 refills | Status: DC

## 2022-12-07 MED ORDER — AZITHROMYCIN 250 MG PO TABS: 250.0000 mg | ORAL_TABLET | Freq: Every day | ORAL | 5 refills | Status: DC

## 2022-12-12 ENCOUNTER — Telehealth: Payer: Self-pay | Admitting: *Deleted

## 2022-12-12 NOTE — Progress Notes (Unsigned)
  Care Coordination Note  12/12/2022 Name: NOBERT HAMMONS MRN: [id number] DOB: 08/31/1955  RAYYAAN BEGGS is a 67 y.o. year old male who is a primary care patient of Carmina Miller, DO and is actively engaged with the care management team. I reached out to [name] by phone today to assist with re-scheduling a follow up visit with the RN Case Manager  Follow up plan: Unsuccessful telephone outreach attempt made. A HIPAA compliant phone message was left for the patient providing contact information and requesting a return call.   [facility]  Care Coordination Care Guide  Direct Dial: [phone]

## 2022-12-13 NOTE — Progress Notes (Signed)
  Care Coordination Note  12/13/2022 Name: Frank [name] MRN: [id number] DOB: 10-25-1955  Frank [name] is a 67 y.o. year old male who is a primary care patient of Carmina Miller, DO and is actively engaged with the care management team. I reached out to [name] by phone today to assist with re-scheduling a follow up visit with the RN Case Manager  Follow up plan: Telephone appointment with care management team member scheduled for:12/19/22  [facility] Coordination Care Guide  Direct Dial: [phone]

## 2022-12-19 ENCOUNTER — Ambulatory Visit: Payer: Self-pay

## 2022-12-19 NOTE — Patient Instructions (Signed)
Visit Information  Thank you for taking time to visit with me today. Please don't hesitate to contact me if I can be of assistance to you.   Following are the goals we discussed today:   Goals Addressed             This Visit's Progress    I will monitor and control my COPD the best I can       Patient Goals/Self Care Activities: -Patient/Caregiver will self-administer medications as prescribed as evidenced by self-report/primary caregiver report  -Patient/Caregiver will attend all scheduled provider appointments as evidenced by clinician review of documented attendance to scheduled appointments and patient/caregiver report -Patient/Caregiver will call provider office for new concerns or questions as evidenced by review of documented incoming telephone call notes and patient report -Patient/Caregiver will focus on medication adherence by taking medications as prescribed  Provided patient with basic written and verbal COPD education on self care/management/and exacerbation prevention Advised patient to track and manage COPD triggers Advised patient to self assesses COPD action plan zone and make appointment with provider if in the yellow zone for 48 hours without improvement -Think about wearing a mask to avoid pollen Discussed the importance of adequate rest and management of fatigue with COPD Active listening / Reflection utilized  Emotional Support Provided Problem Solving /Task Center strategies reviewed Continue with your review and evaluation daily Stay out of the heat Be careful around the home        Our next appointment is by telephone on 02/06/23 at 130 pm  Please call the care guide team at [phone] if you need to cancel or reschedule your appointment.   If you are experiencing a Mental Health or Behavioral Health Crisis or need someone to talk to, please call 1-800-273-TALK (toll free, 24 hour hotline)  Patient verbalizes understanding of instructions and care  plan provided today and agrees to view in MyChart. Active MyChart status and patient understanding of how to access instructions and care plan via MyChart confirmed with patient.     Juanell Fairly RN, BSN, [facility] Care Coordinator [facility]   Phone: [phone]

## 2022-12-19 NOTE — Patient Outreach (Signed)
  Care Coordination   Follow Up Visit Note   12/19/2022 Name: Frank Frank [name] MRN: [id number] DOB: 21-Jan-1956  Frank Frank [name] is a 67 y.o. year old male who sees Carmina Miller, DO for primary care. I spoke with  Frank Frank [name] by phone today.  What matters to the patients health and wellness today?  Frank Frank [name] sounded in good spirits during our conversation. He is managing well with his 5-liter oxygen and is not experiencing any issues with his medication or sleep. He did have a minor incident where he mistakenly thought the recliner was closed and tried to push the arm down from a standing position, resulting in a fall. Fortunately, he did not sustain any serious injuries, just a minor skin abrasion on his shoulder. However, it's important to note that such incidents could lead to more severe injuries, and caution is advised.     Goals Addressed             This Visit's Progress    I will monitor and control my COPD the best I can       Frank [name] Goals/Self Care Activities: -Frank [name] will self-administer medications as prescribed as evidenced by self-report/primary caregiver report  -Frank [name] will attend all scheduled provider appointments as evidenced by clinician review of documented attendance to scheduled appointments and Frank [name] report -Frank [name] will call provider office for new concerns or questions as evidenced by review of documented incoming telephone call notes and Frank [name] report -Frank [name] will focus on medication adherence by taking medications as prescribed  Provided Frank [name] with basic written and verbal COPD education on self care/management/and exacerbation prevention Advised Frank [name] to track and manage COPD triggers Advised Frank [name] to self assesses COPD action plan zone and make appointment with provider if in the yellow zone for 48 hours without improvement -Think about wearing a mask to avoid pollen Discussed the importance of  adequate rest and management of fatigue with COPD Active listening / Reflection utilized  Emotional Support Provided Problem Solving /Task Center strategies reviewed Continue with your review and evaluation daily Stay out of the heat Be careful around the home        SDOH assessments and interventions completed:  No     Care Coordination Interventions:  Yes, provided   Interventions Today    Flowsheet Row Most Recent Value  Chronic Disease   Chronic disease during today's visit Chronic Obstructive Pulmonary Disease (COPD)  General Interventions   General Interventions Discussed/Reviewed General Interventions Discussed, General Interventions Reviewed  Pharmacy Interventions   Pharmacy Dicussed/Reviewed Pharmacy Topics Discussed  Safety Interventions   Safety Discussed/Reviewed Fall Risk        Follow up plan: Follow up call scheduled for 02/06/23  130 pm    Encounter Outcome:  Pt. Visit Completed   Juanell Fairly RN, BSN, [facility] Care Coordinator [facility]   Phone: [phone]

## 2022-12-29 ENCOUNTER — Other Ambulatory Visit: Payer: Self-pay | Admitting: Internal Medicine

## 2022-12-29 DIAGNOSIS — N401 Enlarged prostate with lower urinary tract symptoms: Secondary | ICD-10-CM

## 2023-01-02 DIAGNOSIS — J449 Chronic obstructive pulmonary disease, unspecified: Secondary | ICD-10-CM | POA: Diagnosis not present

## 2023-01-03 ENCOUNTER — Other Ambulatory Visit (HOSPITAL_BASED_OUTPATIENT_CLINIC_OR_DEPARTMENT_OTHER): Payer: Self-pay

## 2023-01-03 MED ORDER — TRELEGY ELLIPTA 100-62.5-25 MCG/ACT IN AEPB: 1.0000 | INHALATION_SPRAY | Freq: Every day | RESPIRATORY_TRACT | 1 refills | Status: DC

## 2023-02-01 DIAGNOSIS — C61 Malignant neoplasm of prostate: Secondary | ICD-10-CM | POA: Diagnosis not present

## 2023-02-02 DIAGNOSIS — J449 Chronic obstructive pulmonary disease, unspecified: Secondary | ICD-10-CM | POA: Diagnosis not present

## 2023-02-05 ENCOUNTER — Encounter: Payer: Self-pay | Admitting: Primary Care

## 2023-02-05 ENCOUNTER — Ambulatory Visit (INDEPENDENT_AMBULATORY_CARE_PROVIDER_SITE_OTHER): Payer: Medicare Other | Admitting: Primary Care

## 2023-02-05 VITALS — BP 123/74 | HR 84 | Ht 64.0 in | Wt 135.4 lb

## 2023-02-05 DIAGNOSIS — J9611 Chronic respiratory failure with hypoxia: Secondary | ICD-10-CM | POA: Diagnosis not present

## 2023-02-05 DIAGNOSIS — Z5181 Encounter for therapeutic drug level monitoring: Secondary | ICD-10-CM

## 2023-02-05 DIAGNOSIS — Z9981 Dependence on supplemental oxygen: Secondary | ICD-10-CM

## 2023-02-05 DIAGNOSIS — Z23 Encounter for immunization: Secondary | ICD-10-CM

## 2023-02-05 DIAGNOSIS — J449 Chronic obstructive pulmonary disease, unspecified: Secondary | ICD-10-CM | POA: Diagnosis not present

## 2023-02-05 NOTE — Patient Instructions (Addendum)
No changes today to medications  Continue Trelegy, prednisone, mucinex, ipratropium-albuterol  Continue 4-6L oxygen to maintain O2 >88-90%   Orders: EKG re: medication compliance  Please call [facility] to check on referral  Follow-up: 4 months with Dr. Delton Coombes

## 2023-02-05 NOTE — Progress Notes (Signed)
@Frank [name]  ID: Frank Frank [name], male    DOB: 04/04/1956, 67 y.o.   MRN: [id number]  Chief Complaint  Frank [name] presents with   Follow-up    Referring provider: Carmina Miller, DO  HPI: 67 year old male, former smoker quit in 2017.  Past medical history significant for chronic respiratory failure, COPD, prostate cancer.  Frank [name] of Dr. Delton Coombes.  Previous [facility] pulmonary encounters: R OV 08/10/2020 --Frank Frank [name] is 58 and follows up today for his chronic hypoxemic respiratory failure due to very severe COPD.  He had COVID-19 in October 2021.  He requires oxygen at 4 L/min.  We have tried him on chronic prednisone but he did not feel that it helped him much.  He is managed on Trelegy.  He uses his albuterol approximately 2x a day. He has had more exertional SOB for the last 2 weeks. ? Increased mucous from pollen but no wheeze or cough.  He is not taking flonase right now. He has restarted loratadine about 3 days ago.   ROV 07/26/21 --[name] is a 51 with severe COPD and associated chronic hypoxemic respiratory failure.  He has allergic rhinitis with cough and nasal obstruction.  I last saw him as a virtual visit August 2022. Managed on Trelegy, chronic prednisone 10 mg daily, oxygen 4 L/min I treated him for a flare of his COPD about 2 weeks ago with a prednisone taper Today he reports that he has ups and downs w his breathing and functional capacity - has trouble w warmer weather / temperature extremes. He has episodes of head and chest congestion, significantly impacts his exercise tolerance. He is using mucinex prn. He uses flonase but not every day. Uses albuterol several times a week. He is up and around - uses wheelchair when he is out of the house.  Flu and COVID vaccines up to date  ROV 12/06/2022 --follow-up visit for severe COPD.  67 year old man with COPD, chronic hypoxemic respiratory failure, allergic rhinitis, cough and nasal obstruction.  He is on chronic prednisone 10 mg once daily.  Oxygen at  5 L/min.  Maintenance BD is Trelegy.  He uses Mucinex as needed, Flonase as needed.  Admitted for pneumonia in late May.  He has palliative care assistance.  Albuterol approximately 4x a day.  Today he reports that he has had some good and bad days, often impacted by hot air. Does better in the [facility]. He has some dry cough daily.  Palliative Care to be re-established in mid August.   Recommendations:  Please continue your Trelegy 1 elation once daily.  Rinse and gargle after using. Keep albuterol available to use 1 nebulizer treatment or 2 puffs up to every 4 hours if needed for shortness of breath, chest tightness, wheezing. Continue prednisone 10 mg once daily. Start azithromycin 250 mg once daily.  We will continue this medication indefinitely Continue Mucinex and Flonase as needed Continue your oxygen at 4-6 l/min Agree with Palliative care evaluation this month as planned Follow with APP in 2 months Follow Dr. Delton Coombes in 6 months, sooner if you have problems.    02/05/2023- Interim hx  Frank [name] presents today for 2 month follow-up.  He was most recently seen by Dr. Delton Coombes in July. He is maintained on Trelegy and 10 mg of prednisone.  He was started on azithromycin 250 mg once daily. Referred to palliative care.  He lives alone. Accompanied by his sister. Breathing has been fairly good since last visit until his air conditioner broke, plans on getting  serviced tomorrow. He has noticed benefit in sinus/chest congestion since starting daily azithromycin.  He has had no recent flare ups. He is compliant with TRELEGY, 10mg  daily prednisone. Uses duoneb three times a day on average. He takes his medication himself, does not use a pill box. His sister will pick up his prescription. He uses 4-6 L of oxygen. Social worked came out and left pack regarding palliative care with [facility]. His sister states that palliative care called once and set up one visit but nevered showed. She has called several times but never  got an answer. He is needing help with some ADLS and bathing. Appetite has been decreased. He eats mostly frozen meals and drinks ensure.    No Known Allergies  Immunization History  Administered Date(s) Administered   Fluad Quad(high Dose 65+) 02/24/2020   Fluad Trivalent(High Dose 65+) 02/05/2023   Influenza Inj Mdck Quad Pf 02/07/2017   Influenza Split 06/07/2012, 02/05/2017   Influenza, High Dose Seasonal PF 03/18/2019   Influenza, Quadrivalent, Recombinant, Inj, Pf 02/05/2018   Influenza,inj,Quad PF,6+ Mos 01/29/2013, 02/04/2016, 03/07/2016   Influenza-Unspecified 01/19/2015, 02/07/2017   PFIZER(Purple Top)SARS-COV-2 Vaccination 08/03/2019, 08/24/2019   Pneumococcal Conjugate-13 11/13/2013   Pneumococcal Polysaccharide-23 06/07/2012, 07/08/2019   Tdap 06/18/2012    Past Medical History:  Diagnosis Date   Arthritis    Cancer (HCC)    COPD (chronic obstructive pulmonary disease) (HCC) 06/05/2012   COVID-19 02/28/2020   Diverticulosis of colon 06/09/2016   Dyspnea    Emphysema 06/07/2012   Heart murmur    hx of as baby   History of colonic polyps 06/17/2014   History of kidney stones    Hypoxemia 06/07/2012   Obstructive chronic bronchitis with exacerbation (HCC) 06/07/2012   Pneumonia    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   Tobacco abuse 06/05/2012    Tobacco History: Social History   Tobacco Use  Smoking Status Former   Current packs/day: 0.00   Average packs/day: 0.5 packs/day for 35.0 years (17.5 ttl pk-yrs)   Types: Cigarettes   Start date: 05/10/1980   Quit date: 05/11/2015   Years since quitting: 7.7  Smokeless Tobacco Former   Types: Chew   Quit date: 05/08/1968   Counseling given: Not Answered   Outpatient Medications Prior to Visit  Medication Sig Dispense Refill   abiraterone acetate (ZYTIGA) 250 MG tablet Take 1,000 mg by mouth daily.     albuterol (ACCUNEB) 0.63 MG/3ML nebulizer solution USE 1 VIAL VIA NEBULIZER EVERY 6 HOURS  (Frank [name] taking differently: Take 1 ampule by nebulization every 6 (six) hours. COPD) 300 mL 3   albuterol (VENTOLIN HFA) 108 (90 Base) MCG/ACT inhaler INHALE 2 PUFFS BY MOUTH EVERY 4 HOURS AS NEEDED FOR WHEEZING OR SHORTNESS OF BREATH (Frank [name] taking differently: Inhale 1 puff into the lungs every 4 (four) hours as needed for shortness of breath (COPD).) 90 g 0   azithromycin (ZITHROMAX) 250 MG tablet Take 1 tablet (250 mg total) by mouth daily. 30 tablet 5   Calcium Carb-Cholecalciferol (OYSTER SHELL CALCIUM/D3 PO) Take 1 tablet by mouth daily with breakfast. 600 mg     escitalopram (LEXAPRO) 5 MG tablet Take 1 tablet (5 mg total) by mouth daily. 30 tablet 11   fluticasone (FLONASE) 50 MCG/ACT nasal spray Place 1 spray into both nostrils daily. (Frank [name] taking differently: Place 1 spray into both nostrils 2 (two) times daily.) 16 g 3   Fluticasone-Umeclidin-Vilant (TRELEGY ELLIPTA) 100-62.5-25 MCG/ACT AEPB Inhale 1 puff into the lungs daily. 180  each 1   ipratropium-albuterol (DUONEB) 0.5-2.5 (3) MG/3ML SOLN Take 3 mLs by nebulization every 6 (six) hours. (Frank [name] taking differently: Take 3 mLs by nebulization every 6 (six) hours. COPD) 360 mL 0   loratadine (CLARITIN) 10 MG tablet Take 1 tablet (10 mg total) by mouth daily. 30 tablet 0   OXYGEN Inhale 4-5 L/min into the lungs continuous.     predniSONE (DELTASONE) 10 MG tablet Take 1 tablet (10 mg total) by mouth daily with breakfast. 90 tablet 3   No facility-administered medications prior to visit.   Review of Systems  Review of Systems  Constitutional:  Positive for appetite change.  HENT: Negative.    Respiratory:  Negative for cough and shortness of breath.     Physical Exam  BP 123/74 (BP Location: Right Arm, Frank [name] Position: Sitting, Cuff Size: Normal)   Pulse 84   Ht 5\' 4"  (1.626 m)   Wt 135 lb 6.4 oz (61.4 kg)   SpO2 (!) 84% Comment: 4L  BMI 23.24 kg/m  Physical Exam Constitutional:      Appearance: Normal appearance.   HENT:     Head: Normocephalic and atraumatic.  Cardiovascular:     Rate and Rhythm: Normal rate and regular rhythm.  Pulmonary:     Effort: Pulmonary effort is normal.     Breath sounds: Normal breath sounds.  Neurological:     General: No focal deficit present.     Mental Status: He is alert and oriented to person, place, and time. Mental status is at baseline.  Psychiatric:        Mood and Affect: Mood normal.        Behavior: Behavior normal.        Thought Content: Thought content normal.        Judgment: Judgment normal.      Lab Results:  CBC    Component Value Date/Time   WBC 9.0 10/11/2022 1456   WBC 7.2 09/29/2022 0508   RBC 3.30 (L) 10/11/2022 1456   RBC 2.67 (L) 09/29/2022 0508   HGB 9.7 (L) 10/11/2022 1456   HCT 30.5 (L) 10/11/2022 1456   PLT 347 10/11/2022 1456   MCV 92 10/11/2022 1456   MCH 29.4 10/11/2022 1456   MCH 29.6 09/29/2022 0508   MCHC 31.8 10/11/2022 1456   MCHC 30.9 09/29/2022 0508   RDW 12.8 10/11/2022 1456   LYMPHSABS 1.6 09/29/2022 0508   MONOABS 0.7 09/29/2022 0508   EOSABS 0.2 09/29/2022 0508   BASOSABS 0.1 09/29/2022 0508    BMET    Component Value Date/Time   NA 139 09/29/2022 0508   NA 143 06/30/2021 1349   K 3.9 09/29/2022 0508   CL 94 (L) 09/29/2022 0508   CO2 41 (H) 09/29/2022 0508   GLUCOSE 102 (H) 09/29/2022 0508   BUN 14 09/29/2022 0508   BUN 21 06/30/2021 1349   CREATININE 0.70 09/29/2022 0508   CALCIUM 8.5 (L) 09/29/2022 0508   GFRNONAA >60 09/29/2022 0508   GFRAA >60 10/05/2019 0950    BNP    Component Value Date/Time   BNP 153.0 (H) 08/05/2022 1547    ProBNP    Component Value Date/Time   PROBNP 47.4 02/20/2014 1505    Imaging: No results found.   Assessment & Plan:   COPD (chronic obstructive pulmonary disease) (HCC) - Stable interval; No exacerbations in last 2 months - Reports improvement in congestion since starting Azithromycin  - No changes recommended today to medications  - Continue  Trelegy , prednisone 10 mg daily, azithromycin daily, mucinex, ipratropium-albuterol nebulizer q 6 hours   Orders: EKG re: medication  Please call [facility] to check on referral  Follow-up: 4 months with Dr. Delton Coombes   Chronic respiratory failure with hypoxia, on home O2 therapy (HCC) - Continue O2 4-6L to maintain O2 >88-90%    Glenford Bayley, NP 02/05/2023

## 2023-02-05 NOTE — Assessment & Plan Note (Signed)
-   Continue O2 4-6L to maintain O2 >88-90%

## 2023-02-05 NOTE — Assessment & Plan Note (Signed)
-   Stable interval; No exacerbations in last 2 months - Reports improvement in congestion since starting Azithromycin  - No changes recommended today to medications  - Continue Trelegy , prednisone 10 mg daily, azithromycin daily, mucinex, ipratropium-albuterol nebulizer q 6 hours   Orders: EKG re: medication  Please call [facility] to check on referral  Follow-up: 4 months with Dr. Delton Coombes

## 2023-02-06 ENCOUNTER — Ambulatory Visit: Payer: Self-pay

## 2023-02-06 NOTE — Patient Instructions (Signed)
Visit Information  Thank you for taking time to visit with me today. Please don't hesitate to contact me if I can be of assistance to you.   Following are the goals we discussed today:   Goals Addressed             This Visit's Progress    COMPLETED: I will monitor and control my COPD the best I can       Patient Goals/Self Care Activities: -Patient/Caregiver will self-administer medications as prescribed as evidenced by self-report/primary caregiver report  -Patient/Caregiver will attend all scheduled provider appointments as evidenced by clinician review of documented attendance to scheduled appointments and patient/caregiver report -Patient/Caregiver will call provider office for new concerns or questions as evidenced by review of documented incoming telephone call notes and patient report -Patient/Caregiver will focus on medication adherence by taking medications as prescribed  Provided patient with basic written and verbal COPD education on self care/management/and exacerbation prevention Advised patient to track and manage COPD triggers Advised patient to self assesses COPD action plan zone and make appointment with provider if in the yellow zone for 48 hours without improvement -Think about wearing a mask to avoid pollen Discussed the importance of adequate rest and management of fatigue with COPD Active listening / Reflection utilized  Emotional Support Provided Problem Solving /Task Center strategies reviewed Continue with your review and evaluation daily Stay out of the heat Be careful around the home         If you are experiencing a Mental Health or Behavioral Health Crisis or need someone to talk to, please call 1-800-273-TALK (toll free, 24 hour hotline)  Patient verbalizes understanding of instructions and care plan provided today and agrees to view in MyChart. Active MyChart status and patient understanding of how to access instructions and care plan via MyChart  confirmed with patient.     Follow Up Plan: No follow up scheduled with CM team at this time

## 2023-02-06 NOTE — Patient Outreach (Signed)
Care Coordination   Follow Up Visit Note   02/06/2023 Name: DANFORD WORTHINGTON MRN: [id number] DOB: 1956/02/16  BRANDN SLIWINSKI is a 67 y.o. year old male who sees Carmina Miller, DO for primary care. I spoke with  [name] and hiss sister [name] by phone today.  What matters to the patients health and wellness today?  During a recent conversation with Mr. Selbe and his sister, [name], Mr. Hawken expressed that he is currently managing adequately, although experiencing some difficulty with breathing in humid conditions. Hehas puchased a portable air conditioner and a fan, primarily spending his time in the living room. Furthermore, he mentioned encountering challenges with sleep due to the humid atmosphere. [name] informed me that despite having access to a hospital bed and other relevant equipment, Mr. Mackiewicz is not using these resources at present. He is presently using 4 liters of supplemental oxygen and is adhering to his prescribed medication regimen. While they indicated that this call will be our final interaction, they acknowledged the potential need for future assistance and know to inform their physician accordingly.    Goals Addressed             This Visit's Progress    COMPLETED: I will monitor and control my COPD the best I can       Patient Goals/Self Care Activities: -Patient/Caregiver will self-administer medications as prescribed as evidenced by self-report/primary caregiver report  -Patient/Caregiver will attend all scheduled provider appointments as evidenced by clinician review of documented attendance to scheduled appointments and patient/caregiver report -Patient/Caregiver will call provider office for new concerns or questions as evidenced by review of documented incoming telephone call notes and patient report -Patient/Caregiver will focus on medication adherence by taking medications as prescribed  Provided patient with basic written and verbal COPD education on self  care/management/and exacerbation prevention Advised patient to track and manage COPD triggers Advised patient to self assesses COPD action plan zone and make appointment with provider if in the yellow zone for 48 hours without improvement -Think about wearing a mask to avoid pollen Discussed the importance of adequate rest and management of fatigue with COPD Active listening / Reflection utilized  Emotional Support Provided Problem Solving /Task Center strategies reviewed Continue with your review and evaluation daily Stay out of the heat Be careful around the home        SDOH assessments and interventions completed:  No     Care Coordination Interventions:  Yes, provided   Interventions Today    Flowsheet Row Most Recent Value  Chronic Disease   Chronic disease during today's visit Chronic Obstructive Pulmonary Disease (COPD)  General Interventions   General Interventions Discussed/Reviewed General Interventions Discussed  Nutrition Interventions   Nutrition Discussed/Reviewed Nutrition Discussed, Fluid intake  Pharmacy Interventions   Pharmacy Dicussed/Reviewed Pharmacy Topics Discussed  Safety Interventions   Safety Discussed/Reviewed Safety Discussed, Fall Risk, Home Safety        Follow up plan: No further intervention required.   Encounter Outcome:  Patient Visit Completed   Juanell Fairly RN, BSN, [facility] [employer] Phone: [phone]

## 2023-02-08 DIAGNOSIS — C7951 Secondary malignant neoplasm of bone: Secondary | ICD-10-CM | POA: Diagnosis not present

## 2023-02-08 DIAGNOSIS — N202 Calculus of kidney with calculus of ureter: Secondary | ICD-10-CM | POA: Diagnosis not present

## 2023-02-08 DIAGNOSIS — C61 Malignant neoplasm of prostate: Secondary | ICD-10-CM | POA: Diagnosis not present

## 2023-02-08 DIAGNOSIS — R338 Other retention of urine: Secondary | ICD-10-CM | POA: Diagnosis not present

## 2023-02-10 ENCOUNTER — Other Ambulatory Visit: Payer: Self-pay | Admitting: Emergency Medicine

## 2023-03-04 DIAGNOSIS — J449 Chronic obstructive pulmonary disease, unspecified: Secondary | ICD-10-CM | POA: Diagnosis not present

## 2023-03-21 ENCOUNTER — Telehealth: Payer: Self-pay | Admitting: Student

## 2023-03-21 NOTE — Telephone Encounter (Signed)
Bradd Canary RN [facility] palliative Services calling to give a report.  Please call back to  [phone].

## 2023-03-21 NOTE — Telephone Encounter (Signed)
Return call to [facility] with [facility]. Stated pt's sister is concern of pt's weight loss. Raynelle Fanning stated pt is only eating 1 meal a day w/a supplement daily , weight is down from 134 lbs to 131 lbs. She reported pt has a loss of interest, some sadness and feeling of hopelessness - but no report of harming himself. She did a PHQ-9 today which was 14. She recommends changing to Remeron to help with the depression and to help his appetite.Thanks

## 2023-03-24 DIAGNOSIS — J9611 Chronic respiratory failure with hypoxia: Secondary | ICD-10-CM | POA: Diagnosis not present

## 2023-03-24 DIAGNOSIS — Z0001 Encounter for general adult medical examination with abnormal findings: Secondary | ICD-10-CM | POA: Diagnosis not present

## 2023-03-24 DIAGNOSIS — Z7189 Other specified counseling: Secondary | ICD-10-CM | POA: Diagnosis not present

## 2023-03-24 DIAGNOSIS — J449 Chronic obstructive pulmonary disease, unspecified: Secondary | ICD-10-CM | POA: Diagnosis not present

## 2023-03-24 DIAGNOSIS — E440 Moderate protein-calorie malnutrition: Secondary | ICD-10-CM | POA: Diagnosis not present

## 2023-03-28 ENCOUNTER — Emergency Department (HOSPITAL_COMMUNITY): Payer: Medicare Other

## 2023-03-28 ENCOUNTER — Other Ambulatory Visit: Payer: Self-pay

## 2023-03-28 ENCOUNTER — Encounter (HOSPITAL_COMMUNITY): Payer: Self-pay

## 2023-03-28 ENCOUNTER — Inpatient Hospital Stay (HOSPITAL_COMMUNITY)
Admission: EM | Admit: 2023-03-28 | Discharge: 2023-04-02 | DRG: 189 | Disposition: A | Discharge status: Hospice | Payer: Medicare Other | Attending: Internal Medicine | Admitting: Internal Medicine

## 2023-03-28 DIAGNOSIS — J9611 Chronic respiratory failure with hypoxia: Secondary | ICD-10-CM | POA: Diagnosis not present

## 2023-03-28 DIAGNOSIS — J9622 Acute and chronic respiratory failure with hypercapnia: Principal | ICD-10-CM | POA: Diagnosis present

## 2023-03-28 DIAGNOSIS — Z8616 Personal history of COVID-19: Secondary | ICD-10-CM

## 2023-03-28 DIAGNOSIS — J9621 Acute and chronic respiratory failure with hypoxia: Secondary | ICD-10-CM | POA: Diagnosis present

## 2023-03-28 DIAGNOSIS — Z681 Body mass index (BMI) 19 or less, adult: Secondary | ICD-10-CM

## 2023-03-28 DIAGNOSIS — R531 Weakness: Secondary | ICD-10-CM | POA: Diagnosis not present

## 2023-03-28 DIAGNOSIS — N3000 Acute cystitis without hematuria: Secondary | ICD-10-CM | POA: Diagnosis not present

## 2023-03-28 DIAGNOSIS — N390 Urinary tract infection, site not specified: Secondary | ICD-10-CM | POA: Insufficient documentation

## 2023-03-28 DIAGNOSIS — R0602 Shortness of breath: Secondary | ICD-10-CM | POA: Diagnosis not present

## 2023-03-28 DIAGNOSIS — J441 Chronic obstructive pulmonary disease with (acute) exacerbation: Secondary | ICD-10-CM | POA: Diagnosis not present

## 2023-03-28 DIAGNOSIS — J309 Allergic rhinitis, unspecified: Secondary | ICD-10-CM | POA: Diagnosis not present

## 2023-03-28 DIAGNOSIS — J9602 Acute respiratory failure with hypercapnia: Principal | ICD-10-CM

## 2023-03-28 DIAGNOSIS — R404 Transient alteration of awareness: Secondary | ICD-10-CM | POA: Diagnosis not present

## 2023-03-28 DIAGNOSIS — Z555 Less than a high school diploma: Secondary | ICD-10-CM | POA: Diagnosis not present

## 2023-03-28 DIAGNOSIS — Z87442 Personal history of urinary calculi: Secondary | ICD-10-CM | POA: Diagnosis not present

## 2023-03-28 DIAGNOSIS — Z79899 Other long term (current) drug therapy: Secondary | ICD-10-CM | POA: Diagnosis not present

## 2023-03-28 DIAGNOSIS — B952 Enterococcus as the cause of diseases classified elsewhere: Secondary | ICD-10-CM | POA: Diagnosis present

## 2023-03-28 DIAGNOSIS — R8281 Pyuria: Secondary | ICD-10-CM | POA: Diagnosis not present

## 2023-03-28 DIAGNOSIS — R4182 Altered mental status, unspecified: Secondary | ICD-10-CM | POA: Diagnosis present

## 2023-03-28 DIAGNOSIS — C7951 Secondary malignant neoplasm of bone: Secondary | ICD-10-CM | POA: Diagnosis present

## 2023-03-28 DIAGNOSIS — Z9079 Acquired absence of other genital organ(s): Secondary | ICD-10-CM | POA: Diagnosis not present

## 2023-03-28 DIAGNOSIS — Z8546 Personal history of malignant neoplasm of prostate: Secondary | ICD-10-CM | POA: Diagnosis not present

## 2023-03-28 DIAGNOSIS — Z83719 Family history of colon polyps, unspecified: Secondary | ICD-10-CM | POA: Diagnosis not present

## 2023-03-28 DIAGNOSIS — Z860100 Personal history of colon polyps, unspecified: Secondary | ICD-10-CM | POA: Diagnosis not present

## 2023-03-28 DIAGNOSIS — Z7189 Other specified counseling: Secondary | ICD-10-CM | POA: Diagnosis not present

## 2023-03-28 DIAGNOSIS — J439 Emphysema, unspecified: Secondary | ICD-10-CM | POA: Diagnosis present

## 2023-03-28 DIAGNOSIS — R0689 Other abnormalities of breathing: Secondary | ICD-10-CM | POA: Diagnosis not present

## 2023-03-28 DIAGNOSIS — I700 Atherosclerosis of aorta: Secondary | ICD-10-CM | POA: Diagnosis not present

## 2023-03-28 DIAGNOSIS — F419 Anxiety disorder, unspecified: Secondary | ICD-10-CM | POA: Diagnosis not present

## 2023-03-28 DIAGNOSIS — Z87891 Personal history of nicotine dependence: Secondary | ICD-10-CM

## 2023-03-28 DIAGNOSIS — Z515 Encounter for palliative care: Secondary | ICD-10-CM | POA: Diagnosis not present

## 2023-03-28 DIAGNOSIS — Z9981 Dependence on supplemental oxygen: Secondary | ICD-10-CM | POA: Diagnosis not present

## 2023-03-28 DIAGNOSIS — Z8249 Family history of ischemic heart disease and other diseases of the circulatory system: Secondary | ICD-10-CM

## 2023-03-28 DIAGNOSIS — Z825 Family history of asthma and other chronic lower respiratory diseases: Secondary | ICD-10-CM

## 2023-03-28 DIAGNOSIS — G9349 Other encephalopathy: Secondary | ICD-10-CM | POA: Diagnosis present

## 2023-03-28 DIAGNOSIS — R630 Anorexia: Secondary | ICD-10-CM | POA: Diagnosis present

## 2023-03-28 DIAGNOSIS — R64 Cachexia: Secondary | ICD-10-CM | POA: Diagnosis present

## 2023-03-28 DIAGNOSIS — C61 Malignant neoplasm of prostate: Secondary | ICD-10-CM | POA: Diagnosis present

## 2023-03-28 DIAGNOSIS — J9601 Acute respiratory failure with hypoxia: Secondary | ICD-10-CM | POA: Diagnosis present

## 2023-03-28 LAB — COMPREHENSIVE METABOLIC PANEL
ALT: 11 U/L (ref 0–44)
AST: 25 U/L (ref 15–41)
Albumin: 3.5 g/dL (ref 3.5–5.0)
Alkaline Phosphatase: 83 U/L (ref 38–126)
Anion gap: 14 (ref 5–15)
BUN: 20 mg/dL (ref 8–23)
CO2: 42 mmol/L — ABNORMAL HIGH (ref 22–32)
Calcium: 10.1 mg/dL (ref 8.9–10.3)
Chloride: 89 mmol/L — ABNORMAL LOW (ref 98–111)
Creatinine, Ser: 0.86 mg/dL (ref 0.61–1.24)
GFR, Estimated: >60 mL/min (ref >60)
Glucose, Bld: 94 mg/dL (ref 70–99)
Potassium: 4.5 mmol/L (ref 3.5–5.1)
Sodium: 145 mmol/L (ref 135–145)
Total Bilirubin: 1 mg/dL (ref <1.2)
Total Protein: 7.1 g/dL (ref 6.5–8.1)

## 2023-03-28 LAB — CBC WITH DIFFERENTIAL/PLATELET
Abs Immature Granulocytes: 0.03 10*3/uL (ref 0.00–0.07)
Basophils Absolute: 0.1 10*3/uL (ref 0.0–0.1)
Basophils Relative: 1 %
Eosinophils Absolute: 0.1 10*3/uL (ref 0.0–0.5)
Eosinophils Relative: 1 %
HCT: 36.7 % — ABNORMAL LOW (ref 39.0–52.0)
Hemoglobin: 11.1 g/dL — ABNORMAL LOW (ref 13.0–17.0)
Immature Granulocytes: 0 %
Lymphocytes Relative: 8 %
Lymphs Abs: 0.7 10*3/uL (ref 0.7–4.0)
MCH: 29.1 pg (ref 26.0–34.0)
MCHC: 30.2 g/dL (ref 30.0–36.0)
MCV: 96.1 fL (ref 80.0–100.0)
Monocytes Absolute: 0.4 10*3/uL (ref 0.1–1.0)
Monocytes Relative: 5 %
Neutro Abs: 6.8 10*3/uL (ref 1.7–7.7)
Neutrophils Relative %: 85 %
Platelets: 151 10*3/uL (ref 150–400)
RBC: 3.82 MIL/uL — ABNORMAL LOW (ref 4.22–5.81)
RDW: 12.7 % (ref 11.5–15.5)
WBC: 7.9 10*3/uL (ref 4.0–10.5)
nRBC: 0 % (ref 0.0–0.2)

## 2023-03-28 LAB — URINALYSIS, ROUTINE W REFLEX MICROSCOPIC
Bilirubin Urine: NEGATIVE
Glucose, UA: NEGATIVE mg/dL
Ketones, ur: 20 mg/dL — AB
Nitrite: NEGATIVE
Protein, ur: 100 mg/dL — AB
Specific Gravity, Urine: 1.014 (ref 1.005–1.030)
WBC, UA: >50 WBC/hpf (ref 0–5)
pH: 5 (ref 5.0–8.0)

## 2023-03-28 LAB — I-STAT VENOUS BLOOD GAS, ED
Acid-Base Excess: 23 mmol/L — ABNORMAL HIGH (ref 0.0–2.0)
Bicarbonate: 52.2 mmol/L — ABNORMAL HIGH (ref 20.0–28.0)
Calcium, Ion: 1.14 mmol/L — ABNORMAL LOW (ref 1.15–1.40)
HCT: 39 % (ref 39.0–52.0)
Hemoglobin: 13.3 g/dL (ref 13.0–17.0)
O2 Saturation: 77 %
Potassium: 4.5 mmol/L (ref 3.5–5.1)
Sodium: 139 mmol/L (ref 135–145)
TCO2: >50 mmol/L — ABNORMAL HIGH (ref 22–32)
pCO2, Ven: 80.4 mm[Hg] (ref 44–60)
pH, Ven: 7.421 (ref 7.25–7.43)
pO2, Ven: 44 mm[Hg] (ref 32–45)

## 2023-03-28 LAB — RESP PANEL BY RT-PCR (RSV, FLU A&B, COVID)  RVPGX2
Influenza A by PCR: NEGATIVE
Influenza B by PCR: NEGATIVE
Resp Syncytial Virus by PCR: NEGATIVE
SARS Coronavirus 2 by RT PCR: NEGATIVE

## 2023-03-28 LAB — TROPONIN I (HIGH SENSITIVITY)
Troponin I (High Sensitivity): 9 ng/L (ref <18)
Troponin I (High Sensitivity): 11 ng/L (ref <18)

## 2023-03-28 LAB — I-STAT CG4 LACTIC ACID, ED: Lactic Acid, Venous: 0.8 mmol/L (ref 0.5–1.9)

## 2023-03-28 LAB — BRAIN NATRIURETIC PEPTIDE: B Natriuretic Peptide: 34.4 pg/mL (ref 0.0–100.0)

## 2023-03-28 MED ORDER — OYSTER SHELL CALCIUM/D3 500-5 MG-MCG PO TABS
1.0000 | ORAL_TABLET | Freq: Every day | ORAL | Status: DC
  Administered 2023-03-30 – 2023-04-02 (×4): 1 via ORAL
  Filled 2023-03-28 (×6): qty 1

## 2023-03-28 MED ORDER — ARFORMOTEROL TARTRATE 15 MCG/2ML IN NEBU
15.0000 ug | INHALATION_SOLUTION | Freq: Two times a day (BID) | RESPIRATORY_TRACT | Status: DC
  Administered 2023-03-28 – 2023-04-02 (×9): 15 ug via RESPIRATORY_TRACT
  Filled 2023-03-28 (×9): qty 2

## 2023-03-28 MED ORDER — METHYLPREDNISOLONE SODIUM SUCC 125 MG IJ SOLR
125.0000 mg | Freq: Once | INTRAMUSCULAR | Status: AC
  Administered 2023-03-28: 125 mg via INTRAVENOUS
  Filled 2023-03-28: qty 2

## 2023-03-28 MED ORDER — BUDESONIDE 0.25 MG/2ML IN SUSP
0.2500 mg | Freq: Two times a day (BID) | RESPIRATORY_TRACT | Status: DC
  Administered 2023-03-28 – 2023-04-02 (×10): 0.25 mg via RESPIRATORY_TRACT
  Filled 2023-03-28 (×10): qty 2

## 2023-03-28 MED ORDER — REVEFENACIN 175 MCG/3ML IN SOLN
175.0000 ug | Freq: Every day | RESPIRATORY_TRACT | Status: DC
  Administered 2023-03-29 – 2023-04-02 (×5): 175 ug via RESPIRATORY_TRACT
  Filled 2023-03-28 (×5): qty 3

## 2023-03-28 MED ORDER — ENOXAPARIN SODIUM 40 MG/0.4ML IJ SOSY
40.0000 mg | PREFILLED_SYRINGE | INTRAMUSCULAR | Status: DC
  Administered 2023-03-28 – 2023-04-01 (×5): 40 mg via SUBCUTANEOUS
  Filled 2023-03-28 (×5): qty 0.4

## 2023-03-28 MED ORDER — SODIUM CHLORIDE 0.9 % IV SOLN
1.0000 g | Freq: Once | INTRAVENOUS | Status: AC
  Administered 2023-03-28: 1 g via INTRAVENOUS
  Filled 2023-03-28: qty 10

## 2023-03-28 MED ORDER — ESCITALOPRAM OXALATE 10 MG PO TABS
5.0000 mg | ORAL_TABLET | Freq: Every day | ORAL | Status: DC
  Administered 2023-03-29 – 2023-04-02 (×5): 5 mg via ORAL
  Filled 2023-03-28 (×5): qty 1

## 2023-03-28 MED ORDER — AZITHROMYCIN 500 MG PO TABS
500.0000 mg | ORAL_TABLET | Freq: Every day | ORAL | Status: AC
  Administered 2023-03-29 – 2023-03-31 (×3): 500 mg via ORAL
  Filled 2023-03-28: qty 1
  Filled 2023-03-28: qty 2
  Filled 2023-03-28: qty 1
  Filled 2023-03-28: qty 2

## 2023-03-28 MED ORDER — OXYMETAZOLINE HCL 0.05 % NA SOLN
1.0000 | Freq: Two times a day (BID) | NASAL | Status: AC
  Administered 2023-03-28 – 2023-03-30 (×3): 1 via NASAL
  Filled 2023-03-28: qty 30

## 2023-03-28 MED ORDER — PREDNISONE 20 MG PO TABS
40.0000 mg | ORAL_TABLET | Freq: Every day | ORAL | Status: AC
  Administered 2023-03-29 – 2023-04-02 (×5): 40 mg via ORAL
  Filled 2023-03-28 (×5): qty 2

## 2023-03-28 MED ORDER — IPRATROPIUM-ALBUTEROL 0.5-2.5 (3) MG/3ML IN SOLN
3.0000 mL | RESPIRATORY_TRACT | Status: AC
  Administered 2023-03-28 (×3): 3 mL via RESPIRATORY_TRACT
  Filled 2023-03-28: qty 3
  Filled 2023-03-28: qty 6

## 2023-03-28 MED ORDER — GUAIFENESIN ER 600 MG PO TB12: 600.0000 mg | ORAL_TABLET | Freq: Every day | ORAL | Status: DC | PRN

## 2023-03-28 MED ORDER — IPRATROPIUM-ALBUTEROL 0.5-2.5 (3) MG/3ML IN SOLN
3.0000 mL | RESPIRATORY_TRACT | Status: DC
  Administered 2023-03-29 (×4): 3 mL via RESPIRATORY_TRACT
  Filled 2023-03-28 (×4): qty 3

## 2023-03-28 MED ORDER — LORATADINE 10 MG PO TABS
10.0000 mg | ORAL_TABLET | Freq: Every day | ORAL | Status: DC
  Administered 2023-03-29 – 2023-04-02 (×5): 10 mg via ORAL
  Filled 2023-03-28 (×6): qty 1

## 2023-03-28 MED ORDER — LACTATED RINGERS IV BOLUS
500.0000 mL | Freq: Once | INTRAVENOUS | Status: AC
  Administered 2023-03-28: 500 mL via INTRAVENOUS

## 2023-03-28 MED ORDER — ABIRATERONE ACETATE 250 MG PO TABS: 1000.0000 mg | ORAL_TABLET | Freq: Every morning | ORAL | Status: DC

## 2023-03-28 MED ORDER — ENSURE ENLIVE PO LIQD
237.0000 mL | Freq: Two times a day (BID) | ORAL | Status: DC
  Administered 2023-03-29 – 2023-04-02 (×6): 237 mL via ORAL
  Filled 2023-03-28: qty 237

## 2023-03-28 MED ORDER — ACETAMINOPHEN 325 MG PO TABS: 650.0000 mg | ORAL_TABLET | Freq: Four times a day (QID) | ORAL | Status: DC | PRN

## 2023-03-28 MED ORDER — ALBUTEROL SULFATE (2.5 MG/3ML) 0.083% IN NEBU
2.5000 mg | INHALATION_SOLUTION | Freq: Once | RESPIRATORY_TRACT | Status: AC
Start: 2023-03-28 — End: 2023-03-28
  Administered 2023-03-28: 2.5 mg via RESPIRATORY_TRACT
  Filled 2023-03-28: qty 3

## 2023-03-28 NOTE — ED Notes (Signed)
X-ray at bedside

## 2023-03-28 NOTE — ED Triage Notes (Addendum)
Patient arrives via [facility] for altered mental status. Sister called EMS for hypoxia, altered LOC. Normally on 6L nasal cannula continuously. Upon fire arrival, on 6L at 75%. Hx of COPD x 10 years, prostate cancer x 2 years with decreased appetite. On oral cancer tx. Alert and oriented, answers appropriately, follows commands with increased oxygen applied by EMS. EMS endorses Weakness, tremors, incontinence new onset, increased fatigue per patient sister.   BP 128/74 HR 98 CBG 109

## 2023-03-28 NOTE — Hospital Course (Addendum)
  Frank [name] is a 67 year old male with a history of COPD and metastatic prostate cancer presenting to the ED with shortness of breath and confusion and admitted for a COPD exacerbation.   #COPD exacerbation.  On presentation, he had worsening productive cough, requiring 6 L of supplemental oxygen, up from 5 L that he uses outpatient. VBG with elevated pCO2 of 80, very elevated bicarb.  He was treated with BIPAP, and  5-day course of prednisone 40 mg, and a 3-day course of azithromycin 500mg .  Patient with end-stage severe COPD.   Palliative was consulted , patient would like to pursue home hospice after discharge.  Will discharge patient on Trelegy.  On the day of discharge, patient required 6 L with ambulation, and 5 L at rest.    - Continue prednisone 10 mg. - Continue Azithromycin 250 mg. -Trelegy Ellipta 100/62.5/25 mcg - 6L of supplemental oxygen   UTI Had increased urinary frequency, no dysuria or vomiting.  UA with leukocytes and bacteriuria.  Urine culture grew pan-sensitive enterococcus. Received 3 days of tx with Unasyn   #Prostate cancer metastatic to bone Zytiga was held on admission.  Patient and his sister agreed to home hospice after discharge.  Will discontinue Zytica on discharge.    #Allergic rhinitis Continue outpatient loratadine.   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  BMI is 19.  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

## 2023-03-28 NOTE — ED Notes (Signed)
While on duoneb, sats dropped to 78% (pt still on 6L Fernando [place]inas during tx). Placed on NRB and pt sats recovered and pt completed duoneb without further sat drop. MD informed.

## 2023-03-28 NOTE — H&P (Cosign Needed)
Date: 03/29/2023               Patient Name:  Frank [name] MRN: [id number]  DOB: [birth year] Age / Sex: 67 y.o., male   PCP: Carmina Miller, DO         Medical Service: Internal Medicine Teaching Service         Attending Physician: Dr. Ninetta Lights Lacretia Leigh, MD      First Contact: Dr. Laretta Bolster, MD  Pager: [phone]    Second Contact: Dr. Rocky Morel, DO  Pager: [phone]         After Hours (After 5p/  First Contact Pager: [phone]  weekends / holidays): Second Contact Pager: [phone]   SUBJECTIVE   Chief Complaint: Shortness of breath, confusion   History of Present Illness: Frank [name] is a 67 year old male with a history of COPD and metastatic prostate cancer presenting to the ED with shortness of breath and confusion and admitted for a COPD exacerbation.  Per the patient's family, his health has been slowly declining over the past month.  He has also been sleeping more than usual.  He has been adherent with his medications.  His family also feels like his independence has also declined over the month.  Before, he was able to take his medications for himself but has become more reliant on his family for this.  Over the past month, he has also had a decrease in appetite and a decrease in hydration. The patient will only eat a few bites of his food each day and a few sips of water.  His baseline weight according to his family is 145 pounds, but he is down to 130 pounds.  Over the past week, patient's family feels like his breathing has worsened.  His baseline oxygen requirement is 5 L but this is increased to 6 L over the past week.  He denies any fevers, chills, night sweats, sick contacts, or other concerns.  He denies any stomach pain and says he does not feel like eating.  He has had no pain with urination but has been urinating more than usual.    ED Course: CMP notable for bicarb of 42. BNP, tropes, resp panel normal. VBG notable for bicarb of 52.2 and pCO2 of 80.4.  Lactic acid and CBC WNL. UA notable for ketones of 20, protein of 100, moderate amount of leukocytes, and many bacteria. CT head and CXR largely unremarkable.   Meds:  Current Meds  Medication Sig   abiraterone acetate (ZYTIGA) 250 MG tablet Take 1,000 mg by mouth in the morning.   albuterol (ACCUNEB) 0.63 MG/3ML nebulizer solution USE 1 VIAL VIA NEBULIZER EVERY 6 HOURS (Patient taking differently: Take 1 ampule by nebulization every 6 (six) hours as needed for wheezing or shortness of breath.)   albuterol (VENTOLIN HFA) 108 (90 Base) MCG/ACT inhaler INHALE 2 PUFFS BY MOUTH EVERY 4 HOURS AS NEEDED FOR WHEEZING OR SHORTNESS OF BREATH (Patient taking differently: Inhale 1 puff into the lungs every 4 (four) hours as needed for shortness of breath (COPD).)   Calcium Carb-Cholecalciferol (OYSTER SHELL CALCIUM/D3 PO) Take 1 tablet by mouth daily with breakfast. 600 mg   escitalopram (LEXAPRO) 5 MG tablet Take 1 tablet (5 mg total) by mouth daily.   fluticasone (FLONASE) 50 MCG/ACT nasal spray Place 1 spray into both nostrils daily. (Patient taking differently: Place 1 spray into both nostrils 2 (two) times daily as needed for allergies or rhinitis.)   guaiFENesin (MUCINEX) 600 MG 12 hr  tablet Take 600 mg by mouth daily as needed for cough or to loosen phlegm.   loratadine (CLARITIN) 10 MG tablet Take 1 tablet (10 mg total) by mouth daily.   naproxen sodium (ALEVE) 220 MG tablet Take 220 mg by mouth daily as needed.   OXYGEN Inhale 6 L/min into the lungs continuous.   predniSONE (DELTASONE) 10 MG tablet Take 1 tablet (10 mg total) by mouth daily with breakfast.    Past Medical History Past Medical History:  Diagnosis Date   Arthritis    Cancer (HCC)    COPD (chronic obstructive pulmonary disease) (HCC) 06/05/2012   COVID-19 02/28/2020   Diverticulosis of colon 06/09/2016   Dyspnea    Emphysema 06/07/2012   Heart murmur    hx of as baby   History of colonic polyps 06/17/2014   History of  kidney stones    Hypoxemia 06/07/2012   Obstructive chronic bronchitis with exacerbation (HCC) 06/07/2012   Pneumonia    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   Tobacco abuse 06/05/2012  COPD, prostate cancer  Past Surgical History:  Procedure Laterality Date   CATARACT EXTRACTION, BILATERAL     CYSTOSCOPY WITH LITHOLAPAXY N/A 01/13/2022   Procedure: CYSTOSCOPY WITH LITHOLAPAXY;  Surgeon: Sebastian Ache, MD;  Location: [facility];  Service: Urology;  Laterality: N/A;   CYSTOSCOPY/RETROGRADE/URETEROSCOPY/STONE EXTRACTION WITH BASKET Right 10/04/2016   Procedure: CYSTOSCOPY/RETROGRADE/URETEROSCOPY/STONE EXTRACTION WITH BASKET/ STENT;  Surgeon: Sebastian Ache, MD;  Location: [facility];  Service: Urology;  Laterality: Right;  With LASER   CYSTOSCOPY/URETEROSCOPY/HOLMIUM LASER/STENT PLACEMENT Right 01/13/2022   Procedure: CYSTOSCOPY/URETEROSCOPY/HOLMIUM LASER/STENT PLACEMENT;  Surgeon: Sebastian Ache, MD;  Location: [facility];  Service: Urology;  Laterality: Right;   EXTRACORPOREAL SHOCK WAVE LITHOTRIPSY Right 06/29/2016   Procedure: RIGHT EXTRACORPOREAL SHOCK WAVE LITHOTRIPSY (ESWL);  Surgeon: Crist Fat, MD;  Location: [facility];  Service: Urology;  Laterality: Right;   TRANSURETHRAL RESECTION OF PROSTATE N/A 09/08/2022   Procedure: TRANSURETHRAL RESECTION OF THE PROSTATE (TURP);  Surgeon: Loletta Parish., MD;  Location: [facility];  Service: Urology;  Laterality: N/A;  66 MINS   Social:  Lives With: Sister Support: Good amount of help from family, before somewhat independent able to take meds by himself but now almost entirely dependent on them  Level of Function: Walker to ambulate PCP: Carmina Miller, DO  Substances: Cigarettes once in a while; no alcohol, recreational drugs   Family History:  Family History  Problem Relation Age of Onset   COPD Mother    Colon polyps Father    Heart attack Father 76   Colon polyps Sister    Allergies: Allergies as of 03/28/2023   (No Known  Allergies)   Review of Systems: A complete ROS was negative except as per HPI.   OBJECTIVE:   Physical Exam: Blood pressure 118/61, pulse 93, temperature 98 F (36.7 C), temperature source Oral, resp. rate 17, height 5\' 10"  (1.778 m), weight 60.8 kg, SpO2 97%.   Physical Exam Constitutional:      Appearance: He is underweight.     Interventions: Nasal cannula in place.     Comments: 6 L of O2  HENT:     Head: Normocephalic and atraumatic.  Cardiovascular:     Rate and Rhythm: Normal rate and regular rhythm.     Pulses: Normal pulses.     Heart sounds: Normal heart sounds.  Pulmonary:     Breath sounds: Wheezing present.  Abdominal:     General: Abdomen is flat. Bowel  sounds are normal.     Palpations: Abdomen is soft.  Musculoskeletal:     Right lower leg: No edema.     Left lower leg: No edema.  Neurological:     General: No focal deficit present.     Mental Status: He is lethargic.     Cranial Nerves: Cranial nerves 2-12 are intact.    Labs: CBC    Component Value Date/Time   WBC 4.2 03/29/2023 0423   RBC 3.80 (L) 03/29/2023 0423   HGB 10.7 (L) 03/29/2023 0423   HGB 9.7 (L) 10/11/2022 1456   HCT 34.3 (L) 03/29/2023 0423   HCT 30.5 (L) 10/11/2022 1456   PLT 164 03/29/2023 0423   PLT 347 10/11/2022 1456   MCV 90.3 03/29/2023 0423   MCV 92 10/11/2022 1456   MCH 28.2 03/29/2023 0423   MCHC 31.2 03/29/2023 0423   RDW 12.7 03/29/2023 0423   RDW 12.8 10/11/2022 1456   LYMPHSABS 0.7 03/28/2023 1843   MONOABS 0.4 03/28/2023 1843   EOSABS 0.1 03/28/2023 1843   BASOSABS 0.1 03/28/2023 1843    CMP     Component Value Date/Time   NA 135 03/29/2023 0423   NA 143 06/30/2021 1349   K 4.1 03/29/2023 0423   CL 86 (L) 03/29/2023 0423   CO2 37 (H) 03/29/2023 0423   GLUCOSE 159 (H) 03/29/2023 0423   BUN 22 03/29/2023 0423   BUN 21 06/30/2021 1349   CREATININE 0.82 03/29/2023 0423   CALCIUM 9.8 03/29/2023 0423   PROT 7.1 03/28/2023 1622   PROT 6.7 06/09/2016 1519    ALBUMIN 3.5 03/28/2023 1622   ALBUMIN 4.3 06/09/2016 1519   AST 25 03/28/2023 1622   ALT 11 03/28/2023 1622   ALKPHOS 83 03/28/2023 1622   BILITOT 1.0 03/28/2023 1622   BILITOT 0.4 06/09/2016 1519   GFRNONAA >60 03/29/2023 0423   GFRAA >60 10/05/2019 0950   Imaging: DG Chest  IMPRESSION: 1. No active disease. 2. Emphysema.  CT Head WO Contrast  IMPRESSION: 1. No acute intracranial process. 2. Right frontal and ethmoid sinus disease.  EKG: personally reviewed my interpretation is sinus tachycardia.  ASSESSMENT & PLAN:   Assessment & Plan by Problem: Principal Problem:   Acute hypercapnic respiratory failure (HCC) Active Problems:   Chronic respiratory failure with hypoxia, on home O2 therapy (HCC)   Prostate cancer metastatic to bone (HCC)   Allergic rhinitis   Decreased appetite   Anxiety   COPD exacerbation (HCC)   UTI (urinary tract infection)  Arjuna Crouse is a 67 year old male with a history of COPD and metastatic prostate cancer presenting to the ED with shortness of breath and confusion and admitted for a COPD exacerbation.  #COPD With acute hypercapnic respiratory failure, signs of hypercapnic encephalopathy on exam, and venous pCO2 of around 80.  Oxygenating well at present on 6 L/min of supplemental oxygen, up from 5 L/min that he uses outpatient.  His COPD is very severe and likely end-stage.  He follows with Dr. Delton Coombes of pulmonology for this.  He is on chronic azithromycin, prednisone, and was referred to palliative care for this recently.  I suspect his baseline chronic disease rather than an easily reversible acute exacerbation is driving his current clinical syndrome.  I will admit to progressive for a few hours of BiPAP therapy, repeat VBG.  Start LAMA LABA ICS via nebulizer.  Increase daily prednisone to 40 mg/day x 5 days.  Increase azithromycin to 500 mg/day x 3 days.  #Prostate  cancer metastatic to bone Follows with urology for this.  On antiandrogen  therapy and calcium/vitamin D supplementation, which I will continue for admission.  #Allergic rhinitis Reports pretty significant nasal congestion.  We will treat supportively with oxymetazoline for a few days.  Continue outpatient loratadine.  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  BMI is 19.  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  #Anxiety Stable.  Continue outpatient escitalopram.  #UTI Reports some increased urinary frequency, but no dysuria or burning.  UA with leukocytes and bacteriuria.  I think this is incidental to his current clinical syndrome, not driving encephalopathy.  Will treat with a few days of ceftriaxone.  Diet: Normal VTE: Enoxaparin IVF: None,None Code: Full  Prior to Admission Living Arrangement: Home, living sister Anticipated Discharge Location:  TBD Barriers to Discharge: Weakness, SOB  Dispo: Admit patient to Observation with expected length of stay less than 2 midnights.  Signed: Morrie Sheldon, MD Internal Medicine Resident PGY-1  03/29/2023, 6:57 AM

## 2023-03-28 NOTE — ED Notes (Signed)
Phlebotomy to collect second set of blood cultures

## 2023-03-28 NOTE — ED Notes (Signed)
Patient transported to CT 

## 2023-03-28 NOTE — ED Notes (Signed)
Patient provided urinal to give urine sample when he is able to urinate.

## 2023-03-28 NOTE — ED Notes (Signed)
CBC recollected and sent main lab

## 2023-03-28 NOTE — ED Provider Notes (Signed)
[place] EMERGENCY DEPARTMENT AT [facility] Provider Note   CSN: [id number] Arrival date & time: 03/28/23  1606     History {Add pertinent medical, surgical, social history, OB history to HPI:1} Chief Complaint  Patient presents with   Altered Mental Status    Frank [name] is a 67 y.o. male.   Altered Mental Status 66 year old male history of COPD on baseline 4 L, prostate cancer, former smoker presenting for altered mental status.  Per EMS sister found him confused compared to normal on his baseline oxygen.  When EMS got there he was satting 75% on 6 L.  His mental status improved when they give him additional oxygen.  He reports feeling somewhat short of breath, endorses chronic cough.  Denies chest pain, headache, weakness, numbness.  He does not feel confused, he knows the year and where he is at.  States he only had one of his breathing treatments today compared to normal when he has at least 3 a day.  No fevers or chills.  Is otherwise been at his baseline health.     Home Medications Prior to Admission medications   Medication Sig Start Date End Date Taking? Authorizing Provider  abiraterone acetate (ZYTIGA) 250 MG tablet Take 1,000 mg by mouth daily. 08/08/22   [provider]  albuterol (ACCUNEB) 0.63 MG/3ML nebulizer solution USE 1 VIAL VIA NEBULIZER EVERY 6 HOURS 02/13/23   [name], Les Pou, MD  albuterol (VENTOLIN HFA) 108 (90 Base) MCG/ACT inhaler INHALE 2 PUFFS BY MOUTH EVERY 4 HOURS AS NEEDED FOR WHEEZING OR SHORTNESS OF BREATH Patient taking differently: Inhale 1 puff into the lungs every 4 (four) hours as needed for shortness of breath (COPD). 07/03/22   Luciano Cutter, MD  azithromycin (ZITHROMAX) 250 MG tablet Take 1 tablet (250 mg total) by mouth daily. 12/07/22   Leslye Peer, MD  Calcium Carb-Cholecalciferol (OYSTER SHELL CALCIUM/D3 PO) Take 1 tablet by mouth daily with breakfast. 600 mg    [provider]  escitalopram (LEXAPRO)  5 MG tablet Take 1 tablet (5 mg total) by mouth daily. 11/15/22   Masters, Katie, DO  fluticasone (FLONASE) 50 MCG/ACT nasal spray Place 1 spray into both nostrils daily. Patient taking differently: Place 1 spray into both nostrils 2 (two) times daily. 08/10/20   Leslye Peer, MD  Fluticasone-Umeclidin-Vilant (TRELEGY ELLIPTA) 100-62.5-25 MCG/ACT AEPB Inhale 1 puff into the lungs daily. 01/03/23   Luciano Cutter, MD  ipratropium-albuterol (DUONEB) 0.5-2.5 (3) MG/3ML SOLN Take 3 mLs by nebulization every 6 (six) hours. Patient taking differently: Take 3 mLs by nebulization every 6 (six) hours. COPD 08/11/22   Regalado, Belkys A, MD  loratadine (CLARITIN) 10 MG tablet Take 1 tablet (10 mg total) by mouth daily. 08/12/22   Regalado, Belkys A, MD  OXYGEN Inhale 4-5 L/min into the lungs continuous.    [provider]  predniSONE (DELTASONE) 10 MG tablet Take 1 tablet (10 mg total) by mouth daily with breakfast. 12/07/22   [name], Les Pou, MD      Allergies    Patient has no known allergies.    Review of Systems   Review of Systems Review of systems completed and notable as per HPI.  ROS otherwise negative.   Physical Exam Updated Vital Signs Ht 5\' 10"  (1.778 m)   Wt 60.8 kg   BMI 19.23 kg/m  Physical Exam Vitals and nursing note reviewed.  Constitutional:      General: He is not in acute  distress.    Appearance: He is well-developed.  HENT:     Head: Normocephalic and atraumatic.  Eyes:     Conjunctiva/sclera: Conjunctivae normal.  Cardiovascular:     Rate and Rhythm: Normal rate and regular rhythm.     Pulses: Normal pulses.     Heart sounds: Normal heart sounds. No murmur heard. Pulmonary:     Comments: Diminished aeration with expiratory wheeze bilaterally.  Mild tachypnea. Abdominal:     Palpations: Abdomen is soft.     Tenderness: There is no abdominal tenderness.  Musculoskeletal:        General: No swelling.     Cervical back: Neck supple.     Right lower leg: No  edema.     Left lower leg: No edema.  Skin:    General: Skin is warm and dry.     Capillary Refill: Capillary refill takes less than 2 seconds.  Neurological:     General: No focal deficit present.     Mental Status: He is alert and oriented to person, place, and time. Mental status is at baseline.     Cranial Nerves: No cranial nerve deficit.     Sensory: No sensory deficit.     Motor: No weakness.  Psychiatric:        Mood and Affect: Mood normal.     ED Results / Procedures / Treatments   Labs (all labs ordered are listed, but only abnormal results are displayed) Labs Reviewed - No data to display  EKG None  Radiology No results found.  Procedures Procedures  {Document cardiac monitor, telemetry assessment procedure when appropriate:1}  Medications Ordered in ED Medications - No data to display  ED Course/ Medical Decision Making/ A&P   {   Click here for ABCD2, HEART and other calculatorsREFRESH Note before signing :1}                              Medical Decision Making  Medical Decision Making:   [name] is a 67 y.o. male who presented to the ED today with altered mental status and hypoxia.  He arrives on increased ox requirement.  This has improved when his oxygen saturations improved.  Suspect he has severe COPD exacerbation.  He has diminished aeration bilaterally, but is not in acute distress.  Will hold on BiPAP for now.  Will obtain chest x-ray, lab workup, give steroids and breathing treatment.  Will obtain EKG, lower concern for ACS given no chest pain.  Consider possible PE although pulm exam is more consistent with likely COPD.   {crccomplexity:27900} Reviewed and confirmed nursing documentation for past medical history, family history, social history.  Reassessment and Plan:   ***    Patient's presentation is most consistent with {EM COPA:27473}     {Document critical care time when appropriate:1} {Document review of labs and clinical  decision tools ie heart score, Chads2Vasc2 etc:1}  {Document your independent review of radiology images, and any outside records:1} {Document your discussion with family members, caretakers, and with consultants:1} {Document social determinants of health affecting pt's care:1} {Document your decision making why or why not admission, treatments were needed:1} Final Clinical Impression(s) / ED Diagnoses Final diagnoses:  None    Rx / DC Orders ED Discharge Orders     None

## 2023-03-28 NOTE — Progress Notes (Signed)
Pt Sp02 in the 80's on [place]. Pt placed on 7L HFNC (salter) Sp02 92-94%.  Bipap PRN orders, pt in to distress at this time.

## 2023-03-28 NOTE — Progress Notes (Signed)
Pt placed on Bipap at this time per MD order. Pt tolerating well

## 2023-03-29 DIAGNOSIS — F419 Anxiety disorder, unspecified: Secondary | ICD-10-CM | POA: Diagnosis present

## 2023-03-29 DIAGNOSIS — C7951 Secondary malignant neoplasm of bone: Secondary | ICD-10-CM | POA: Diagnosis present

## 2023-03-29 DIAGNOSIS — J9601 Acute respiratory failure with hypoxia: Secondary | ICD-10-CM | POA: Diagnosis present

## 2023-03-29 DIAGNOSIS — C61 Malignant neoplasm of prostate: Secondary | ICD-10-CM | POA: Diagnosis not present

## 2023-03-29 DIAGNOSIS — Z79899 Other long term (current) drug therapy: Secondary | ICD-10-CM | POA: Diagnosis not present

## 2023-03-29 DIAGNOSIS — N390 Urinary tract infection, site not specified: Secondary | ICD-10-CM | POA: Diagnosis present

## 2023-03-29 DIAGNOSIS — R4182 Altered mental status, unspecified: Secondary | ICD-10-CM | POA: Diagnosis present

## 2023-03-29 DIAGNOSIS — N3000 Acute cystitis without hematuria: Secondary | ICD-10-CM | POA: Diagnosis not present

## 2023-03-29 DIAGNOSIS — Z555 Less than a high school diploma: Secondary | ICD-10-CM | POA: Diagnosis not present

## 2023-03-29 DIAGNOSIS — J9622 Acute and chronic respiratory failure with hypercapnia: Secondary | ICD-10-CM | POA: Diagnosis present

## 2023-03-29 DIAGNOSIS — Z8249 Family history of ischemic heart disease and other diseases of the circulatory system: Secondary | ICD-10-CM | POA: Diagnosis not present

## 2023-03-29 DIAGNOSIS — Z681 Body mass index (BMI) 19 or less, adult: Secondary | ICD-10-CM | POA: Diagnosis not present

## 2023-03-29 DIAGNOSIS — J309 Allergic rhinitis, unspecified: Secondary | ICD-10-CM | POA: Diagnosis present

## 2023-03-29 DIAGNOSIS — Z860100 Personal history of colon polyps, unspecified: Secondary | ICD-10-CM | POA: Diagnosis not present

## 2023-03-29 DIAGNOSIS — Z8546 Personal history of malignant neoplasm of prostate: Secondary | ICD-10-CM | POA: Diagnosis not present

## 2023-03-29 DIAGNOSIS — B952 Enterococcus as the cause of diseases classified elsewhere: Secondary | ICD-10-CM | POA: Diagnosis present

## 2023-03-29 DIAGNOSIS — Z83719 Family history of colon polyps, unspecified: Secondary | ICD-10-CM | POA: Diagnosis not present

## 2023-03-29 DIAGNOSIS — Z7189 Other specified counseling: Secondary | ICD-10-CM

## 2023-03-29 DIAGNOSIS — J439 Emphysema, unspecified: Secondary | ICD-10-CM | POA: Diagnosis present

## 2023-03-29 DIAGNOSIS — Z8616 Personal history of COVID-19: Secondary | ICD-10-CM | POA: Diagnosis not present

## 2023-03-29 DIAGNOSIS — R64 Cachexia: Secondary | ICD-10-CM | POA: Diagnosis present

## 2023-03-29 DIAGNOSIS — R8281 Pyuria: Secondary | ICD-10-CM

## 2023-03-29 DIAGNOSIS — Z9981 Dependence on supplemental oxygen: Secondary | ICD-10-CM | POA: Diagnosis not present

## 2023-03-29 DIAGNOSIS — J9602 Acute respiratory failure with hypercapnia: Secondary | ICD-10-CM

## 2023-03-29 DIAGNOSIS — J441 Chronic obstructive pulmonary disease with (acute) exacerbation: Secondary | ICD-10-CM | POA: Diagnosis not present

## 2023-03-29 DIAGNOSIS — Z87891 Personal history of nicotine dependence: Secondary | ICD-10-CM | POA: Diagnosis not present

## 2023-03-29 DIAGNOSIS — Z9079 Acquired absence of other genital organ(s): Secondary | ICD-10-CM | POA: Diagnosis not present

## 2023-03-29 DIAGNOSIS — J9621 Acute and chronic respiratory failure with hypoxia: Secondary | ICD-10-CM | POA: Diagnosis present

## 2023-03-29 DIAGNOSIS — Z515 Encounter for palliative care: Secondary | ICD-10-CM | POA: Diagnosis not present

## 2023-03-29 DIAGNOSIS — Z87442 Personal history of urinary calculi: Secondary | ICD-10-CM | POA: Diagnosis not present

## 2023-03-29 DIAGNOSIS — G9349 Other encephalopathy: Secondary | ICD-10-CM | POA: Diagnosis present

## 2023-03-29 DIAGNOSIS — Z825 Family history of asthma and other chronic lower respiratory diseases: Secondary | ICD-10-CM | POA: Diagnosis not present

## 2023-03-29 LAB — CBC
HCT: 34.3 % — ABNORMAL LOW (ref 39.0–52.0)
Hemoglobin: 10.7 g/dL — ABNORMAL LOW (ref 13.0–17.0)
MCH: 28.2 pg (ref 26.0–34.0)
MCHC: 31.2 g/dL (ref 30.0–36.0)
MCV: 90.3 fL (ref 80.0–100.0)
Platelets: 164 10*3/uL (ref 150–400)
RBC: 3.8 MIL/uL — ABNORMAL LOW (ref 4.22–5.81)
RDW: 12.7 % (ref 11.5–15.5)
WBC: 4.2 10*3/uL (ref 4.0–10.5)
nRBC: 0 % (ref 0.0–0.2)

## 2023-03-29 LAB — BLOOD GAS, VENOUS
Acid-Base Excess: 20.6 mmol/L — ABNORMAL HIGH (ref 0.0–2.0)
Bicarbonate: 50.2 mmol/L — ABNORMAL HIGH (ref 20.0–28.0)
O2 Saturation: 32.7 %
Patient temperature: 37
pCO2, Ven: 81 mm[Hg] (ref 44–60)
pH, Ven: 7.4 (ref 7.25–7.43)
pO2, Ven: <31 mm[Hg] — CL (ref 32–45)

## 2023-03-29 LAB — I-STAT VENOUS BLOOD GAS, ED
Acid-Base Excess: 17 mmol/L — ABNORMAL HIGH (ref 0.0–2.0)
Bicarbonate: 43.9 mmol/L — ABNORMAL HIGH (ref 20.0–28.0)
Calcium, Ion: 1.18 mmol/L (ref 1.15–1.40)
HCT: 30 % — ABNORMAL LOW (ref 39.0–52.0)
Hemoglobin: 10.2 g/dL — ABNORMAL LOW (ref 13.0–17.0)
O2 Saturation: 83 %
Potassium: 4 mmol/L (ref 3.5–5.1)
Sodium: 137 mmol/L (ref 135–145)
TCO2: 46 mmol/L — ABNORMAL HIGH (ref 22–32)
pCO2, Ven: 67.5 mm[Hg] — ABNORMAL HIGH (ref 44–60)
pH, Ven: 7.421 (ref 7.25–7.43)
pO2, Ven: 49 mm[Hg] — ABNORMAL HIGH (ref 32–45)

## 2023-03-29 LAB — BASIC METABOLIC PANEL
Anion gap: 12 (ref 5–15)
BUN: 22 mg/dL (ref 8–23)
CO2: 37 mmol/L — ABNORMAL HIGH (ref 22–32)
Calcium: 9.8 mg/dL (ref 8.9–10.3)
Chloride: 86 mmol/L — ABNORMAL LOW (ref 98–111)
Creatinine, Ser: 0.82 mg/dL (ref 0.61–1.24)
GFR, Estimated: >60 mL/min (ref >60)
Glucose, Bld: 159 mg/dL — ABNORMAL HIGH (ref 70–99)
Potassium: 4.1 mmol/L (ref 3.5–5.1)
Sodium: 135 mmol/L (ref 135–145)

## 2023-03-29 MED ORDER — SALINE SPRAY 0.65 % NA SOLN
1.0000 | NASAL | Status: DC | PRN
  Filled 2023-03-29: qty 44

## 2023-03-29 MED ORDER — SODIUM CHLORIDE 0.9 % IV SOLN
1.0000 g | INTRAVENOUS | Status: DC
  Administered 2023-03-29: 1 g via INTRAVENOUS
  Filled 2023-03-29 (×2): qty 10

## 2023-03-29 NOTE — Progress Notes (Signed)
HD#0 Subjective:   Summary: This is a 67 year old male with a past medical history of COPD and metastatic prostate cancer who presented for concerns of shortness of breath and confusion.  Patient admitted for further evaluation and management of COPD exacerbation.  Overnight Events: No acute events overnight  Patient evaluated bedside this morning.  He states he is feeling well.  He denies any wheezing, but does report having some shortness of breath.  BiPAP is on at time of evaluation.  Introduced topic of palliative medicine and he was agreeable.  Objective:  Vital signs in last 24 hours: Vitals:   03/29/23 1100 03/29/23 1117 03/29/23 1220 03/29/23 1610  BP: (!) 106/52  120/68 110/60  Pulse: 81  81 67  Resp: (!) 27  (!) 22 (!) 21  Temp:  98 F (36.7 C) 98.2 F (36.8 C) 98.5 F (36.9 C)  TempSrc:  Oral Oral Oral  SpO2: (!) 85%  92% 96%  Weight:   59.2 kg   Height:   5\' 8"  (1.727 m)    Supplemental O2: BiPAP SpO2: 96 % O2 Flow Rate (L/min): 8 L/min FiO2 (%): 40 %   Physical Exam:  Constitutional: Chronically ill-appearing, resting in bed, BiPAP on HENT: normocephalic atraumatic, mucous membranes moist Cardiovascular: regular rate and rhythm, no m/r/g Pulmonary/Chest: No wheezing, rhonchi, rales appreciated.  BiPAP in place  [facility] Weights   03/28/23 1614 03/29/23 1220  Weight: 60.8 kg 59.2 kg    No intake or output data in the 24 hours ending 03/29/23 1846 Net IO Since Admission: No IO data has been entered for this period [03/29/23 1846]  Pertinent Labs:    Latest Ref Rng & Units 03/29/2023    4:23 AM 03/29/2023    2:10 AM 03/28/2023    6:43 PM  CBC  WBC 4.0 - 10.5 K/uL 4.2   7.9   Hemoglobin 13.0 - 17.0 g/dL 13.2  44.0  10.2   Hematocrit 39.0 - 52.0 % 34.3  30.0  36.7   Platelets 150 - 400 K/uL 164   151        Latest Ref Rng & Units 03/29/2023    4:23 AM 03/29/2023    2:10 AM 03/28/2023    4:33 PM  CMP  Glucose 70 - 99 mg/dL 725     BUN 8 - 23  mg/dL 22     Creatinine 3.66 - 1.24 mg/dL 4.40     Sodium 347 - 425 mmol/L 135  137  139   Potassium 3.5 - 5.1 mmol/L 4.1  4.0  4.5   Chloride 98 - 111 mmol/L 86     CO2 22 - 32 mmol/L 37     Calcium 8.9 - 10.3 mg/dL 9.8       Imaging: No results found.  Assessment/Plan:   Principal Problem:   Acute hypercapnic respiratory failure (HCC) Active Problems:   Chronic respiratory failure with hypoxia, on home O2 therapy (HCC)   Prostate cancer metastatic to bone (HCC)   Allergic rhinitis   Decreased appetite   Anxiety   COPD exacerbation (HCC)   UTI (urinary tract infection)   Pyuria   Acute hypoxic respiratory failure (HCC)   Patient Summary: Frank [name] is a 67 y.o. with a pertinent PMH of metastatic prostate cancer as well as COPD who presented for shortness of breath and confusion.  Patient admitted for further evaluation and management of COPD exacerbation.  #Acute on chronic hypercapnic and hypoxemic respiratory failure #COPD exacerbation  Patient evaluated bedside this morning.  Patient is on BiPAP.  He states he is improving, but still having some shortness of breath.  Patient did have repeat VBG which showed decrease of pCO2 to 67.5, but increased back to 81.  Will likely need to be on BiPAP to continue to blow off CO2.  I do wonder patient likely has underlying chronic hypercapnia in the setting of COPD.  81 is still high for patient.  Patient seems to be compensating well with bicarb elevated into the 50s. -Continue steroids with prednisone 40 mg for the next 4 days -Continue Pulmicort nebulizers -Continue Yupelri nebulizers -Continue azithromycin for anti-inflammatory effect -Nightly BiPAP -Monitor VBG  #Urinary tract infection Patient with some concerns for urinary symptoms.  UA did show turbid urine with moderate leukocytes concerning for urinary tract infection. -Continue ceftriaxone day 2 of 5 -Follow urine culture continue antibiotics  #Metastatic prostate  cancer No acute concerns at this time.  Will reach out to [facility] today to ensure patient can continue chemotherapy agents while inpatient. -Consult palliative care -Reach out to [facility] today  #Allergic rhinitis No acute concerns at this time -Continue supportive care  #Anxiety No acute concerns at this time. -Continue escitalopram  Diet: Normal IVF: None,None VTE: Enoxaparin Code: Full PT/OT recs: None, none.  Dispo: Anticipated discharge to Home in 2 days pending clinical improvement.   Modena Slater DO Internal Medicine Resident PGY-2 [phone] Please contact the on call pager after 5 pm and on weekends at [phone].

## 2023-03-29 NOTE — Consult Note (Signed)
Consultation Note Date: 03/29/2023   Patient Name: Frank [name]  DOB: 1955/12/28  MRN: [id number]  Age / Sex: 67 y.o., male  PCP: Carmina Miller, DO Referring Physician: Ginnie Smart, MD  Reason for Consultation: Establishing goals of care  HPI/Patient Profile: 67 y.o. male  with past medical history of COPD and metastatic prostate cancer admitted on 03/28/2023 with shortness of breath and confusion. Admitted for COPD exacerbation. Also being treated for UTI. PMT consulted to discuss GOC.  Clinical Assessment and Goals of Care: I have reviewed medical records including EPIC notes, labs and imaging, received report from RN, assessed the patient and then met with patient and [name]  to discuss diagnosis prognosis, GOC, EOL wishes, disposition and options.  I introduced Palliative Medicine as specialized medical care for people living with serious illness. It focuses on providing relief from the symptoms and stress of a serious illness. The goal is to improve quality of life for both the patient and the family.  Patient tells me he has been doing very well but sister expresses fome disagreement. Upon further questioning patient/sister share he has been using a walker for ambulation and needs assistance with ADLs. Sister shares of very poor appetite and weight loss.   Patient shares if he were unable to make decisions for himself he would like all of his siblings to make decisions for him together - they are legally next of kin, no HCPOA needed - he does not want to specify one as decision maker.    We discussed patient's current illness and what it means in the larger context of patient's on-going co-morbidities.  Natural disease trajectory and expectations at EOL were discussed. We discussed his chronic, irreversible lung disease as well as functional/nutritional decline.   I attempted to elicit values and goals of care important to  the patient.  Most important to patient is being at home, sister shares they need more support in the home.   We discussed code status and patient is frustrated by this discussion saying he has been asking about it 10,000 times. He tells me he would like to remain full code.  Discussed with family and patient the importance of continued conversation with family and the medical providers regarding overall plan of care and treatment options, ensuring decisions are within the context of the patient's values and GOCs.    Hospice and Palliative Care services outpatient were explained and offered. Sister shares her interest in hospice services. We discuss philosophy of hospice care and type of support provided. She would like this service in the home. Reviewed hospice care with patient and he agrees as well - emphasized focus on comfort and avoiding aggressive medical interventions, he confirms he would like to move forward with hospice support in the home.   Questions and concerns were addressed. The family was encouraged to call with questions or concerns.    Primary Decision Maker PATIENT    SUMMARY OF RECOMMENDATIONS   - at this point requests to remain full code/full scope - will need ongoing conversations - interested in support of hospice at discharge, understands focus on comfort and avoiding aggressive medical interventions  Code Status/Advance Care Planning: Full code     Primary Diagnoses: Present on Admission:  COPD exacerbation (HCC)  Prostate cancer metastatic to bone (HCC)  Allergic rhinitis  Anxiety  Decreased appetite  Acute hypoxic respiratory failure (HCC)   I have reviewed the medical record, interviewed the patient and family, and examined the patient.  The following aspects are pertinent.  Past Medical History:  Diagnosis Date   Arthritis    Cancer (HCC)    COPD (chronic obstructive pulmonary disease) (HCC) 06/05/2012   COVID-19 02/28/2020   Diverticulosis of  colon 06/09/2016   Dyspnea    Emphysema 06/07/2012   Heart murmur    hx of as baby   History of colonic polyps 06/17/2014   History of kidney stones    Hypoxemia 06/07/2012   Obstructive chronic bronchitis with exacerbation (HCC) 06/07/2012   Pneumonia    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   Tobacco abuse 06/05/2012   Social History   Socioeconomic History   Marital status: Single    Spouse name: Not on file   Number of children: 0   Years of education: Not on file   Highest education level: 9th grade  Occupational History   Not on file  Tobacco Use   Smoking status: Former    Current packs/day: 0.00    Average packs/day: 0.5 packs/day for 35.0 years (17.5 ttl pk-yrs)    Types: Cigarettes    Start date: 05/10/1980    Quit date: 05/11/2015    Years since quitting: 7.8   Smokeless tobacco: Former    Types: Chew    Quit date: 05/08/1968  Vaping Use   Vaping status: Never Used  Substance and Sexual Activity   Alcohol use: No   Drug use: No   Sexual activity: Not Currently  Other Topics Concern   Not on file  Social History Narrative   Current Social History 12/20/2020        Patient lives alone in a home which is 2 stories. There is one step up to the entrance the patient uses.       Patient's method of transportation is via family member (sister)      The highest level of education was some high school.      The patient currently retired.      Identified important Relationships are "my sisters"      Pets : 0       Interests / Fun: "Play games and watch TV"       Current Stressors: "none"       Religious / Personal Beliefs: [employer]    Social Determinants of Health   Financial Resource Strain: Low Risk  (09/14/2022)   Overall Financial Resource Strain (CARDIA)    Difficulty of Paying Living Expenses: Not very hard  Food Insecurity: No Food Insecurity (03/29/2023)   Hunger Vital Sign    Worried About Running Out of Food in the Last Year: Never  true    Ran Out of Food in the Last Year: Never true  Transportation Needs: No Transportation Needs (03/29/2023)   PRAPARE - Administrator, Civil Service (Medical): No    Lack of Transportation (Non-Medical): No  Physical Activity: Inactive (07/17/2022)   Exercise Vital Sign    Days of Exercise per Week: 0 days    Minutes of Exercise per Session: 0 min  Stress: No Stress Concern Present (07/17/2022)   [employer] of Occupational Health - Occupational Stress Questionnaire    Feeling of Stress : Not at all  Social Connections: Socially Isolated (07/17/2022)   Social Connection and Isolation Panel [NHANES]    Frequency of Communication with Friends and Family: More than three times a week    Frequency of Social Gatherings with Friends and Family: Three times a week    Attends  Religious Services: Never    Active Member of Clubs or Organizations: No    Attends Banker Meetings: Never    Marital Status: Never married   Family History  Problem Relation Age of Onset   COPD Mother    Colon polyps Father    Heart attack Father 31   Colon polyps Sister    Scheduled Meds:  abiraterone acetate  1,000 mg Oral q AM   arformoterol  15 mcg Nebulization BID   azithromycin  500 mg Oral Daily   budesonide (PULMICORT) nebulizer solution  0.25 mg Nebulization BID   calcium-vitamin D  1 tablet Oral Q breakfast   enoxaparin (LOVENOX) injection  40 mg Subcutaneous Q24H   escitalopram  5 mg Oral Daily   feeding supplement  237 mL Oral BID BM   ipratropium-albuterol  3 mL Nebulization Q4H while awake   loratadine  10 mg Oral Daily   oxymetazoline  1 spray Each Nare BID   predniSONE  40 mg Oral Q breakfast   revefenacin  175 mcg Nebulization Daily   Continuous Infusions:  cefTRIAXone (ROCEPHIN)  IV     PRN Meds:.acetaminophen, guaiFENesin No Known Allergies Review of Systems  Constitutional:  Positive for activity change and appetite change.    Physical  Exam Constitutional:      General: He is not in acute distress.    Appearance: He is ill-appearing.  Pulmonary:     Effort: Pulmonary effort is normal.  Skin:    General: Skin is warm and dry.  Neurological:     Mental Status: He is alert.     Vital Signs: BP 110/60 (BP Location: Right Arm)   Pulse 67   Temp 98.5 F (36.9 C) (Oral)   Resp (!) 21   Ht 5\' 8"  (1.727 m)   Wt 59.2 kg   SpO2 96%   BMI 19.86 kg/m  Pain Scale: 0-10   Pain Score: 0-No pain   SpO2: SpO2: 96 % O2 Device:SpO2: 96 % O2 Flow Rate: .O2 Flow Rate (L/min): 10 L/min  IO: Intake/output summary: No intake or output data in the 24 hours ending 03/29/23 1617  LBM:   Baseline Weight: Weight: 60.8 kg Most recent weight: Weight: 59.2 kg     Palliative Assessment/Data: PPS 50%     *Please note that this is a verbal dictation therefore any spelling or grammatical errors are due to the "Dragon Medical One" system interpretation.   Time Total: 60 minutes Time spent includes: Detailed review of medical records (labs, imaging, vital signs), medically appropriate exam, discussion with treatment team, counseling and educating patient, family and/or staff, documenting clinical information, medication management and coordination of care.    Gerlean Ren, DNP, AGNP-C Palliative Medicine Team [phone] Pager: [phone]

## 2023-03-29 NOTE — Progress Notes (Signed)
Prior-To-Admission Oral Chemotherapy for Treatment of Oncologic Disease   Order noted from Dr. Benito Mccreedy to continue prior-to-admission oral chemotherapy regimen of Zytiga.  Procedure Per Pharmacy & Therapeutics Committee Policy: Orders for continuation of home oral chemotherapy for treatment of an oncologic disease will be held unless approved by an oncologist during current admission.    For patients receiving oncology care at [facility], inpatient pharmacist contacts patient's oncologist during regular office hours to review. If earlier review is medically necessary, attending physician consults [facility] on-call oncologist   For patients receiving oncology care outside of [facility], attending physician consults patient's oncologist to review. If this oncologist or their coverage cannot be reached, attending physician consults [facility] on-call oncologist   Oral chemotherapy continuation order is on hold pending oncologist review, awaiting callback from urologist Dr. Berneice Heinrich at [facility].   Rexford Maus, PharmD, BCPS 03/29/2023 2:46 PM

## 2023-03-29 NOTE — ED Notes (Signed)
ED TO INPATIENT HANDOFF REPORT  ED Nurse Name and Phone #: Dot Lanes, Paramedic [phone]  S Name/Age/Gender Frank [name] 67 y.o. male Room/Bed: 035C/035C  Code Status   Code Status: Full Code  Home/SNF/Other Home [name] oriented to: self, place, time, and situation Is this baseline? Yes   Triage Complete: Triage complete  Chief Complaint COPD exacerbation [facility]) [J44.1]  Triage Note [name] arrives via [facility] for altered mental status. Sister called EMS for hypoxia, altered LOC. Normally on 6L nasal cannula continuously. Upon fire arrival, on 6L at 75%. Hx of COPD x 10 years, prostate cancer x 2 years with decreased appetite. On oral cancer tx. Alert and oriented, answers appropriately, follows commands with increased oxygen applied by EMS. EMS endorses Weakness, tremors, incontinence new onset, increased fatigue per [name] sister.   BP 128/74 HR 98 CBG 109   Allergies No Known Allergies  Level of Care/Admitting Diagnosis ED Disposition     ED Disposition  Admit   Condition  --   Comment  Hospital Area: [facility] [[id number]]  Level of Care: Progressive [102]  Admit to Progressive based on following criteria: RESPIRATORY PROBLEMS hypoxemic/hypercapnic respiratory failure that is responsive to NIPPV (BiPAP) or High Flow Nasal Cannula (6-80 lpm). Frequent assessment/intervention, no > Q2 hrs < Q4 hrs, to maintain oxygenation and pulmonary hygiene.  May place [name] in observation at [facility] or [facility] if equivalent level of care is available:: No  Covid Evaluation: Asymptomatic - no recent exposure (last 10 days) testing not required  Diagnosis: COPD exacerbation [facility]) [[id number]]  Admitting Physician: Reymundo Poll [[id number]]  Attending Physician: Reymundo Poll [[id number]]          B Medical/Surgery History Past Medical History:  Diagnosis Date   Arthritis    Cancer (HCC)    COPD (chronic obstructive pulmonary disease) (HCC)  06/05/2012   COVID-19 02/28/2020   Diverticulosis of colon 06/09/2016   Dyspnea    Emphysema 06/07/2012   Heart murmur    hx of as baby   History of colonic polyps 06/17/2014   History of kidney stones    Hypoxemia 06/07/2012   Obstructive chronic bronchitis with exacerbation (HCC) 06/07/2012   Pneumonia    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   Tobacco abuse 06/05/2012   Past Surgical History:  Procedure Laterality Date   CATARACT EXTRACTION, BILATERAL     CYSTOSCOPY WITH LITHOLAPAXY N/A 01/13/2022   Procedure: CYSTOSCOPY WITH LITHOLAPAXY;  Surgeon: Sebastian Ache, MD;  Location: [facility];  Service: Urology;  Laterality: N/A;   CYSTOSCOPY/RETROGRADE/URETEROSCOPY/STONE EXTRACTION WITH BASKET Right 10/04/2016   Procedure: CYSTOSCOPY/RETROGRADE/URETEROSCOPY/STONE EXTRACTION WITH BASKET/ STENT;  Surgeon: Sebastian Ache, MD;  Location: [facility];  Service: Urology;  Laterality: Right;  With LASER   CYSTOSCOPY/URETEROSCOPY/HOLMIUM LASER/STENT PLACEMENT Right 01/13/2022   Procedure: CYSTOSCOPY/URETEROSCOPY/HOLMIUM LASER/STENT PLACEMENT;  Surgeon: Sebastian Ache, MD;  Location: [facility];  Service: Urology;  Laterality: Right;   EXTRACORPOREAL SHOCK WAVE LITHOTRIPSY Right 06/29/2016   Procedure: RIGHT EXTRACORPOREAL SHOCK WAVE LITHOTRIPSY (ESWL);  Surgeon: Crist Fat, MD;  Location: [facility];  Service: Urology;  Laterality: Right;   TRANSURETHRAL RESECTION OF PROSTATE N/A 09/08/2022   Procedure: TRANSURETHRAL RESECTION OF THE PROSTATE (TURP);  Surgeon: Loletta Parish., MD;  Location: [facility];  Service: Urology;  Laterality: N/A;  75 MINS     A IV Location/Drains/Wounds [name] Lines/Drains/Airways Status     Active Line/Drains/Airways     Name Placement date Placement time Site Days   Peripheral IV  03/28/23 20 G Left;Posterior Forearm 03/28/23  1621  Forearm  1   Pressure Injury 08/04/22 Buttocks Right;Left Stage 1 -  Intact skin with non-blanchable redness of a localized  area usually over a bony prominence. 08/04/22  1840  -- 237            Intake/Output Last 24 hours No intake or output data in the 24 hours ending 03/29/23 0737  Labs/Imaging Results for orders placed or performed during the hospital encounter of 03/28/23 (from the past 48 hour(s))  Comprehensive metabolic panel     Status: Abnormal   Collection Time: 03/28/23  4:22 PM  Result Value Ref Range   Sodium 145 135 - 145 mmol/L   Potassium 4.5 3.5 - 5.1 mmol/L   Chloride 89 (L) 98 - 111 mmol/L   CO2 42 (H) 22 - 32 mmol/L   Glucose, Bld 94 70 - 99 mg/dL    Comment: Glucose reference range applies only to samples taken after fasting for at least 8 hours.   BUN 20 8 - 23 mg/dL   Creatinine, Ser 1.61 0.61 - 1.24 mg/dL   Calcium 09.6 8.9 - 04.5 mg/dL   Total Protein 7.1 6.5 - 8.1 g/dL   Albumin 3.5 3.5 - 5.0 g/dL   AST 25 15 - 41 U/L   ALT 11 0 - 44 U/L   Alkaline Phosphatase 83 38 - 126 U/L   Total Bilirubin 1.0 <1.2 mg/dL   GFR, Estimated >40 >98 mL/min    Comment: (NOTE) Calculated using the CKD-EPI Creatinine Equation (2021)    Anion gap 14 5 - 15    Comment: Performed at [facility], [address], [place], [place] [address]  Brain natriuretic peptide     Status: None   Collection Time: 03/28/23  4:22 PM  Result Value Ref Range   B Natriuretic Peptide 34.4 0.0 - 100.0 pg/mL    Comment: Performed at [facility], [address] [place], [place] [address]  Troponin I (High Sensitivity)     Status: None   Collection Time: 03/28/23  4:22 PM  Result Value Ref Range   Troponin I (High Sensitivity) 9 <18 ng/L    Comment: (NOTE) Elevated high sensitivity troponin I (hsTnI) values and significant  changes across serial measurements may suggest ACS but many other  chronic and acute conditions are known to elevate hsTnI results.  Refer to the "Links" section for chest pain algorithms and additional  guidance. Performed at [facility], [address]  [place], [place] [address]   Resp panel by RT-PCR (RSV, Flu A&B, Covid) Anterior Nasal Swab     Status: None   Collection Time: 03/28/23  4:26 PM   Specimen: Anterior Nasal Swab  Result Value Ref Range   SARS Coronavirus 2 by RT PCR NEGATIVE NEGATIVE   Influenza A by PCR NEGATIVE NEGATIVE   Influenza B by PCR NEGATIVE NEGATIVE    Comment: (NOTE) The Xpert Xpress SARS-CoV-2/FLU/RSV plus assay is intended as an aid in the diagnosis of influenza from Nasopharyngeal swab specimens and should not be used as a sole basis for treatment. Nasal washings and aspirates are unacceptable for Xpert Xpress SARS-CoV-2/FLU/RSV testing.  Fact Sheet for Patients: BloggerCourse.com  Fact Sheet for Healthcare Providers: SeriousBroker.it  This test is not yet approved or cleared by the [place] FDA and has been authorized for detection and/or diagnosis of SARS-CoV-2 by FDA under an Emergency Use Authorization (EUA). This EUA will  remain in effect (meaning this test can be used) for the duration of the COVID-19 declaration under Section 564(b)(1) of the Act, 21 U.S.C. section 360bbb-3(b)(1), unless the authorization is terminated or revoked.     Resp Syncytial Virus by PCR NEGATIVE NEGATIVE    Comment: (NOTE) Fact Sheet for Patients: BloggerCourse.com  Fact Sheet for Healthcare Providers: SeriousBroker.it  This test is not yet approved or cleared by the [place] FDA and has been authorized for detection and/or diagnosis of SARS-CoV-2 by FDA under an Emergency Use Authorization (EUA). This EUA will remain in effect (meaning this test can be used) for the duration of the COVID-19 declaration under Section 564(b)(1) of the Act, 21 U.S.C. section 360bbb-3(b)(1), unless the authorization is terminated or revoked.  Performed at [facility] Lab, [address], [place], [place] [address]    I-Stat venous blood gas, ED     Status: Abnormal   Collection Time: 03/28/23  4:33 PM  Result Value Ref Range   pH, Ven 7.421 7.25 - 7.43   pCO2, Ven 80.4 (HH) 44 - 60 mmHg   pO2, Ven 44 32 - 45 mmHg   Bicarbonate 52.2 (H) 20.0 - 28.0 mmol/L   TCO2 >50 (H) 22 - 32 mmol/L   O2 Saturation 77 %   Acid-Base Excess 23.0 (H) 0.0 - 2.0 mmol/L   Sodium 139 135 - 145 mmol/L   Potassium 4.5 3.5 - 5.1 mmol/L   Calcium, Ion 1.14 (L) 1.15 - 1.40 mmol/L   HCT 39.0 39.0 - 52.0 %   Hemoglobin 13.3 13.0 - 17.0 g/dL   Sample type VENOUS    Comment NOTIFIED PHYSICIAN   I-Stat CG4 Lactic Acid     Status: None   Collection Time: 03/28/23  4:33 PM  Result Value Ref Range   Lactic Acid, Venous 0.8 0.5 - 1.9 mmol/L  CBC with Differential/Platelet     Status: Abnormal   Collection Time: 03/28/23  6:43 PM  Result Value Ref Range   WBC 7.9 4.0 - 10.5 K/uL   RBC 3.82 (L) 4.22 - 5.81 MIL/uL   Hemoglobin 11.1 (L) 13.0 - 17.0 g/dL   HCT 52.8 (L) 41.3 - 24.4 %   MCV 96.1 80.0 - 100.0 fL   MCH 29.1 26.0 - 34.0 pg   MCHC 30.2 30.0 - 36.0 g/dL   RDW 01.0 27.2 - 53.6 %   Platelets 151 150 - 400 K/uL   nRBC 0.0 0.0 - 0.2 %   Neutrophils Relative % 85 %   Neutro Abs 6.8 1.7 - 7.7 K/uL   Lymphocytes Relative 8 %   Lymphs Abs 0.7 0.7 - 4.0 K/uL   Monocytes Relative 5 %   Monocytes Absolute 0.4 0.1 - 1.0 K/uL   Eosinophils Relative 1 %   Eosinophils Absolute 0.1 0.0 - 0.5 K/uL   Basophils Relative 1 %   Basophils Absolute 0.1 0.0 - 0.1 K/uL   Immature Granulocytes 0 %   Abs Immature Granulocytes 0.03 0.00 - 0.07 K/uL    Comment: Performed at [facility], [address] [place], [place] [address]  Troponin I (High Sensitivity)     Status: None   Collection Time: 03/28/23  6:43 PM  Result Value Ref Range   Troponin I (High Sensitivity) 11 <18 ng/L    Comment: (NOTE) Elevated high sensitivity troponin I (hsTnI) values and significant  changes across serial measurements may suggest ACS but many  other  chronic and acute conditions are known  to elevate hsTnI results.  Refer to the "Links" section for chest pain algorithms and additional  guidance. Performed at [facility], [address], [place], [place] [address]   Urinalysis, Routine w reflex microscopic -Urine, Clean Catch     Status: Abnormal   Collection Time: 03/28/23  8:03 PM  Result Value Ref Range   Color, Urine AMBER (A) YELLOW    Comment: BIOCHEMICALS MAY BE AFFECTED BY COLOR   APPearance TURBID (A) CLEAR   Specific Gravity, Urine 1.014 1.005 - 1.030   pH 5.0 5.0 - 8.0   Glucose, UA NEGATIVE NEGATIVE mg/dL   Hgb urine dipstick SMALL (A) NEGATIVE   Bilirubin Urine NEGATIVE NEGATIVE   Ketones, ur 20 (A) NEGATIVE mg/dL   Protein, ur 604 (A) NEGATIVE mg/dL   Nitrite NEGATIVE NEGATIVE   Leukocytes,Ua MODERATE (A) NEGATIVE   RBC / HPF 6-10 0 - 5 RBC/hpf   WBC, UA >50 0 - 5 WBC/hpf   Bacteria, UA MANY (A) NONE SEEN   Squamous Epithelial / HPF 0-5 0 - 5 /HPF   WBC Clumps PRESENT    Mucus PRESENT    Granular Casts, UA PRESENT     Comment: Performed at [facility], [address], [place], [place] [address]  I-Stat venous blood gas, [facility] ED, [facility], [facility])     Status: Abnormal   Collection Time: 03/29/23  2:10 AM  Result Value Ref Range   pH, Ven 7.421 7.25 - 7.43   pCO2, Ven 67.5 (H) 44 - 60 mmHg   pO2, Ven 49 (H) 32 - 45 mmHg   Bicarbonate 43.9 (H) 20.0 - 28.0 mmol/L   TCO2 46 (H) 22 - 32 mmol/L   O2 Saturation 83 %   Acid-Base Excess 17.0 (H) 0.0 - 2.0 mmol/L   Sodium 137 135 - 145 mmol/L   Potassium 4.0 3.5 - 5.1 mmol/L   Calcium, Ion 1.18 1.15 - 1.40 mmol/L   HCT 30.0 (L) 39.0 - 52.0 %   Hemoglobin 10.2 (L) 13.0 - 17.0 g/dL   Sample type VENOUS    Comment NOTIFIED PHYSICIAN   Basic metabolic panel     Status: Abnormal   Collection Time: 03/29/23  4:23 AM  Result Value Ref Range   Sodium 135 135 - 145 mmol/L    Comment: DELTA CHECK NOTED   Potassium 4.1 3.5 - 5.1 mmol/L   Chloride 86 (L) 98 -  111 mmol/L   CO2 37 (H) 22 - 32 mmol/L   Glucose, Bld 159 (H) 70 - 99 mg/dL    Comment: Glucose reference range applies only to samples taken after fasting for at least 8 hours.   BUN 22 8 - 23 mg/dL   Creatinine, Ser 1.19 0.61 - 1.24 mg/dL   Calcium 9.8 8.9 - 14.7 mg/dL   GFR, Estimated >82 >95 mL/min    Comment: (NOTE) Calculated using the CKD-EPI Creatinine Equation (2021)    Anion gap 12 5 - 15    Comment: Performed at [facility], [address] [place], [place] [address]  CBC     Status: Abnormal   Collection Time: 03/29/23  4:23 AM  Result Value Ref Range   WBC 4.2 4.0 - 10.5 K/uL   RBC 3.80 (L) 4.22 - 5.81 MIL/uL   Hemoglobin 10.7 (L) 13.0 - 17.0 g/dL   HCT 86.5 (L) 78.4 - 69.6 %   MCV 90.3 80.0 - 100.0 fL   MCH 28.2 26.0 - 34.0  pg   MCHC 31.2 30.0 - 36.0 g/dL   RDW 16.1 09.6 - 04.5 %   Platelets 164 150 - 400 K/uL   nRBC 0.0 0.0 - 0.2 %    Comment: Performed at [facility], [address], [place], [place] [address]   CT Head Wo Contrast  Result Date: 03/28/2023 CLINICAL DATA:  Altered level of consciousness EXAM: CT HEAD WITHOUT CONTRAST TECHNIQUE: Contiguous axial images were obtained from the base of the skull through the vertex without intravenous contrast. RADIATION DOSE REDUCTION: This exam was performed according to the departmental dose-optimization program which includes automated exposure control, adjustment of the mA and/or kV according to [name] size and/or use of iterative reconstruction technique. COMPARISON:  None Available. FINDINGS: Brain: No acute infarct or hemorrhage. Lateral ventricles and midline structures are unremarkable. No acute extra-axial fluid collections. No mass effect. Vascular: No hyperdense vessel or unexpected calcification. Skull: Normal. Negative for fracture or focal lesion. Sinuses/Orbits: Opacification of the right frontal sinus and right anterior ethmoid air cells. Remaining paranasal sinuses are clear. Other: None.  IMPRESSION: 1. No acute intracranial process. 2. Right frontal and ethmoid sinus disease. Electronically Signed   By: Sharlet Salina M.D.   On: 03/28/2023 20:44   DG Chest Port 1 View  Result Date: 03/28/2023 CLINICAL DATA:  Shortness of breath. EXAM: PORTABLE CHEST 1 VIEW COMPARISON:  Chest radiograph dated 09/25/2022. FINDINGS: Background of emphysema. No focal consolidation, pleural effusion, pneumothorax. Nodular opacity in the right lower lobe seen on the CT not visualized on radiograph. Right lung base linear atelectasis/scarring. The cardiac silhouette is within normal limits. Atherosclerotic calcification of the aorta. No acute osseous pathology. IMPRESSION: 1. No active disease. 2. Emphysema. Electronically Signed   By: Elgie Collard M.D.   On: 03/28/2023 19:55    Pending Labs Unresulted Labs (From admission, onward)     Start     Ordered   03/28/23 2027  Urine Culture  Add-on,   AD       Question:  Indication  Answer:  Altered mental status (if no other cause identified)   03/28/23 2027   03/28/23 1627  Blood culture (routine x 2)  BLOOD CULTURE X 2,   R (with STAT occurrences)      03/28/23 1626   03/28/23 1626  CBC with Differential/Platelet  Once,   STAT        03/28/23 1626            Vitals/Pain Today's Vitals   03/29/23 0600 03/29/23 0700 03/29/23 0732 03/29/23 0737  BP: 111/62 (!) 111/53    Pulse: 68 66    Resp: 13 (!) 22    Temp:   97.8 F (36.6 C)   TempSrc:   Axillary   SpO2: 98% 97%    Weight:      Height:      PainSc: 0-No pain   0-No pain    Isolation Precautions No active isolations  Medications Medications  enoxaparin (LOVENOX) injection 40 mg (40 mg Subcutaneous Given 03/28/23 2258)  revefenacin (YUPELRI) nebulizer solution 175 mcg (has no administration in time range)  arformoterol (BROVANA) nebulizer solution 15 mcg (15 mcg Nebulization Given 03/28/23 2257)  budesonide (PULMICORT) nebulizer solution 0.25 mg (0.25 mg Nebulization Given  03/28/23 2257)  azithromycin (ZITHROMAX) tablet 500 mg (has no administration in time range)  predniSONE (DELTASONE) tablet 40 mg (has no administration in time range)  abiraterone acetate (ZYTIGA) tablet 1,000 mg (1,000 mg Oral Not Given 03/29/23 0602)  ipratropium-albuterol (DUONEB) 0.5-2.5 (3) MG/3ML nebulizer solution 3 mL (3 mLs Nebulization Not Given 03/28/23 2252)  escitalopram (LEXAPRO) tablet 5 mg (has no administration in time range)  calcium-vitamin D (OSCAL WITH D) 500-5 MG-MCG per tablet 1 tablet (has no administration in time range)  loratadine (CLARITIN) tablet 10 mg (has no administration in time range)  guaiFENesin (MUCINEX) 12 hr tablet 600 mg (has no administration in time range)  oxymetazoline (AFRIN) 0.05 % nasal spray 1 spray (1 spray Each Nare Given 03/28/23 2257)  feeding supplement (ENSURE ENLIVE / ENSURE PLUS) liquid 237 mL (has no administration in time range)  acetaminophen (TYLENOL) tablet 650 mg (has no administration in time range)  cefTRIAXone (ROCEPHIN) 1 g in sodium chloride 0.9 % 100 mL IVPB (has no administration in time range)  ipratropium-albuterol (DUONEB) 0.5-2.5 (3) MG/3ML nebulizer solution 3 mL (3 mLs Nebulization Given 03/28/23 1706)  methylPREDNISolone sodium succinate (SOLU-MEDROL) 125 mg/2 mL injection 125 mg (125 mg Intravenous Given 03/28/23 1634)  lactated ringers bolus 500 mL (0 mLs Intravenous Stopped 03/28/23 1844)  cefTRIAXone (ROCEPHIN) 1 g in sodium chloride 0.9 % 100 mL IVPB (0 g Intravenous Stopped 03/28/23 2113)  albuterol (PROVENTIL) (2.5 MG/3ML) 0.083% nebulizer solution 2.5 mg (2.5 mg Nebulization Given 03/28/23 2057)    Mobility walks with device     Focused Assessments    R Recommendations: See Admitting Provider Note  Report given to:   Additional Notes:

## 2023-03-29 NOTE — ED Notes (Signed)
RT at bedside to pause bi-pap so pt can eat

## 2023-03-29 NOTE — ED Notes (Signed)
PT at bedside.

## 2023-03-29 NOTE — Evaluation (Signed)
Physical Therapy Evaluation Patient Details Name: Frank [name] MRN: [id number] DOB: Sep 10, 1955 Today's Date: 03/29/2023  History of Present Illness  Theoden Carles is a 67 year old male with a history of COPD and metastatic prostate cancer presenting to the ED with shortness of breath and confusion and admitted for a COPD exacerbation.  Clinical Impression  Pt presents with admitting diagnosis above. Pt today was able to ambulate in hallway ~63ft RW CGA. Pt received on 12L at 93%. Pt dropped to 83% on 10L during ambulation. Pt left on 12L at 92%. Pt appears to be close to baseline for mobility and was actively getting HHPT PTA. Recommend pt continue with HHPT. PT will continue to follow.       If plan is discharge home, recommend the following: A little help with walking and/or transfers;A little help with bathing/dressing/bathroom;Assistance with cooking/housework;Assist for transportation;Help with stairs or ramp for entrance;Supervision due to cognitive status;Direct supervision/assist for medications management   Can travel by private vehicle        Equipment Recommendations None recommended by PT  Recommendations for Other Services       Functional Status Assessment Patient has had a recent decline in their functional status and demonstrates the ability to make significant improvements in function in a reasonable and predictable amount of time.     Precautions / Restrictions Precautions Precautions: Fall Restrictions Weight Bearing Restrictions: No      Mobility  Bed Mobility Overal bed mobility: Modified Independent                  Transfers Overall transfer level: Needs assistance Equipment used: Rolling walker (2 wheels) Transfers: Sit to/from Stand Sit to Stand: Supervision           General transfer comment: Cues for hand placement    Ambulation/Gait Ambulation/Gait assistance: Contact guard assist Gait Distance (Feet): 50 Feet Assistive  device: Rolling walker (2 wheels) Gait Pattern/deviations: Decreased stride length, Step-through pattern Gait velocity: decreased     General Gait Details: Pt with slowed step through gait. no LOB noted. Pt reports this is his baseline for ambulation. 1 standing rest break when pt reported dizziness.  Stairs            Wheelchair Mobility     Tilt Bed    Modified Rankin (Stroke Patients Only)       Balance Overall balance assessment: Needs assistance Sitting-balance support: Bilateral upper extremity supported, Feet supported Sitting balance-Leahy Scale: Good Sitting balance - Comments: EOB   Standing balance support: Bilateral upper extremity supported, During functional activity Standing balance-Leahy Scale: Fair Standing balance comment: Reliant on RW                             Pertinent Vitals/Pain Pain Assessment Pain Assessment: No/denies pain    Home Living Family/patient expects to be discharged to:: Private residence Living Arrangements: Alone Available Help at Discharge: Family;Available PRN/intermittently (Sister) Type of Home: House Home Access: Level entry       Home Layout: Two level;Able to live on main level with bedroom/bathroom Home Equipment: Cane - single point;Rolling Walker (2 wheels);Shower seat;Other (comment) (O2 tanks and concentrator) Additional Comments: uses 5L/min O2 at home    Prior Function Prior Level of Function : Independent/Modified Independent             Mobility Comments: Pt reports sometimes using RW sometimes without it for household distances. WC for doctors appointments ADLs Comments: Sister  helps with ADLs     Extremity/Trunk Assessment   Upper Extremity Assessment Upper Extremity Assessment: Overall WFL for tasks assessed    Lower Extremity Assessment Lower Extremity Assessment: Overall WFL for tasks assessed    Cervical / Trunk Assessment Cervical / Trunk Assessment: Normal   Communication   Communication Communication: No apparent difficulties Cueing Techniques: Verbal cues;Tactile cues  Cognition Arousal: Alert Behavior During Therapy: WFL for tasks assessed/performed Overall Cognitive Status: Within Functional Limits for tasks assessed                                          General Comments General comments (skin integrity, edema, etc.): Pt received on 12L at 93%. Pt dropped to 83% on 10L during ambulation. Pt left on 12L at 92%.    Exercises     Assessment/Plan    PT Assessment Patient needs continued PT services  PT Problem List Decreased strength;Decreased range of motion;Decreased activity tolerance;Decreased balance;Decreased mobility;Decreased coordination;Decreased cognition;Decreased knowledge of use of DME;Decreased safety awareness;Decreased knowledge of precautions;Cardiopulmonary status limiting activity       PT Treatment Interventions DME instruction;Gait training;Therapeutic activities;Stair training;Functional mobility training;Therapeutic exercise;Balance training;Neuromuscular re-education;Cognitive remediation;Patient/family education    PT Goals (Current goals can be found in the Care Plan section)  Acute Rehab PT Goals Patient Stated Goal: to go home PT Goal Formulation: With patient Time For Goal Achievement: 04/12/23 Potential to Achieve Goals: Good    Frequency Min 1X/week     Co-evaluation               AM-PAC PT "6 Clicks" Mobility  Outcome Measure Help needed turning from your back to your side while in a flat bed without using bedrails?: None Help needed moving from lying on your back to sitting on the side of a flat bed without using bedrails?: None Help needed moving to and from a bed to a chair (including a wheelchair)?: A Little Help needed standing up from a chair using your arms (e.g., wheelchair or bedside chair)?: A Little Help needed to walk in hospital room?: A Little Help  needed climbing 3-5 steps with a railing? : A Lot 6 Click Score: 19    End of Session Equipment Utilized During Treatment: Gait belt;Oxygen Activity Tolerance: Patient tolerated treatment well Patient left: in bed;with call bell/phone within reach Nurse Communication: Mobility status PT Visit Diagnosis: Other abnormalities of gait and mobility (R26.89)    Time: 1108-1140 PT Time Calculation (min) (ACUTE ONLY): 32 min   Charges:   PT Evaluation $PT Eval Moderate Complexity: 1 Mod PT Treatments $Gait Training: 8-22 mins PT General Charges $$ ACUTE PT VISIT: 1 Visit         Shela Nevin, PT, DPT Acute Rehab Services [phone]   Gladys Damme 03/29/2023, 11:59 AM

## 2023-03-29 NOTE — ED Notes (Signed)
Called floor to let them know patient is coming upstairs

## 2023-03-30 DIAGNOSIS — C61 Malignant neoplasm of prostate: Secondary | ICD-10-CM | POA: Diagnosis not present

## 2023-03-30 DIAGNOSIS — Z7189 Other specified counseling: Secondary | ICD-10-CM | POA: Diagnosis not present

## 2023-03-30 DIAGNOSIS — C7951 Secondary malignant neoplasm of bone: Secondary | ICD-10-CM

## 2023-03-30 DIAGNOSIS — Z515 Encounter for palliative care: Secondary | ICD-10-CM | POA: Diagnosis not present

## 2023-03-30 DIAGNOSIS — J9602 Acute respiratory failure with hypercapnia: Secondary | ICD-10-CM | POA: Diagnosis not present

## 2023-03-30 LAB — BASIC METABOLIC PANEL
Anion gap: 11 (ref 5–15)
BUN: 19 mg/dL (ref 8–23)
CO2: 42 mmol/L — ABNORMAL HIGH (ref 22–32)
Calcium: 9.4 mg/dL (ref 8.9–10.3)
Chloride: 88 mmol/L — ABNORMAL LOW (ref 98–111)
Creatinine, Ser: 0.83 mg/dL (ref 0.61–1.24)
GFR, Estimated: >60 mL/min (ref >60)
Glucose, Bld: 93 mg/dL (ref 70–99)
Potassium: 3.3 mmol/L — ABNORMAL LOW (ref 3.5–5.1)
Sodium: 141 mmol/L (ref 135–145)

## 2023-03-30 LAB — CBC
HCT: 29.2 % — ABNORMAL LOW (ref 39.0–52.0)
Hemoglobin: 9.3 g/dL — ABNORMAL LOW (ref 13.0–17.0)
MCH: 28.7 pg (ref 26.0–34.0)
MCHC: 31.8 g/dL (ref 30.0–36.0)
MCV: 90.1 fL (ref 80.0–100.0)
Platelets: 168 10*3/uL (ref 150–400)
RBC: 3.24 MIL/uL — ABNORMAL LOW (ref 4.22–5.81)
RDW: 12.9 % (ref 11.5–15.5)
WBC: 9.2 10*3/uL (ref 4.0–10.5)
nRBC: 0 % (ref 0.0–0.2)

## 2023-03-30 LAB — BLOOD GAS, VENOUS
Acid-Base Excess: 24.4 mmol/L — ABNORMAL HIGH (ref 0.0–2.0)
Bicarbonate: 51.9 mmol/L — ABNORMAL HIGH (ref 20.0–28.0)
O2 Saturation: 98.4 %
Patient temperature: 37
pCO2, Ven: 65 mm[Hg] — ABNORMAL HIGH (ref 44–60)
pH, Ven: 7.51 — ABNORMAL HIGH (ref 7.25–7.43)
pO2, Ven: 71 mm[Hg] — ABNORMAL HIGH (ref 32–45)

## 2023-03-30 LAB — MAGNESIUM: Magnesium: 1.9 mg/dL (ref 1.7–2.4)

## 2023-03-30 LAB — TROPONIN I (HIGH SENSITIVITY)
Troponin I (High Sensitivity): 13 ng/L (ref <18)
Troponin I (High Sensitivity): 14 ng/L (ref <18)

## 2023-03-30 MED ORDER — IPRATROPIUM-ALBUTEROL 0.5-2.5 (3) MG/3ML IN SOLN
3.0000 mL | Freq: Four times a day (QID) | RESPIRATORY_TRACT | Status: DC
  Administered 2023-03-30 – 2023-04-01 (×7): 3 mL via RESPIRATORY_TRACT
  Filled 2023-03-30 (×6): qty 3

## 2023-03-30 MED ORDER — POTASSIUM CHLORIDE 20 MEQ PO PACK
40.0000 meq | PACK | Freq: Once | ORAL | Status: AC
  Administered 2023-03-30: 40 meq via ORAL
  Filled 2023-03-30: qty 2

## 2023-03-30 MED ORDER — SODIUM CHLORIDE 0.9 % IV SOLN
3.0000 g | Freq: Four times a day (QID) | INTRAVENOUS | Status: DC
  Administered 2023-03-30 – 2023-04-02 (×11): 3 g via INTRAVENOUS
  Filled 2023-03-30 (×14): qty 8

## 2023-03-30 MED ORDER — POTASSIUM CHLORIDE 20 MEQ PO PACK: 40.0000 meq | PACK | Freq: Two times a day (BID) | ORAL | Status: DC

## 2023-03-30 NOTE — Progress Notes (Signed)
HD#1 Subjective:   Summary: This is a 67 year old male with a past medical history of COPD and metastatic prostate cancer who presented for concerns of shortness of breath and confusion.  Patient admitted for further evaluation and management of COPD exacerbation.  Overnight Events: No acute events overnight  Patient evaluated bedside this morning. He reports not feeling too comfortable.  Objective:  Vital signs in last 24 hours: Vitals:   03/29/23 2259 03/30/23 0330 03/30/23 0749 03/30/23 1128  BP:  119/71 (!) 147/77 109/65  Pulse: 89  93 76  Resp: (!) 24 20 20 20   Temp:  98 F (36.7 C) 98.3 F (36.8 C) 98.2 F (36.8 C)  TempSrc:  Oral Oral Oral  SpO2: 96%  92% 91%  Weight:      Height:       Supplemental O2: BiPAP SpO2: 91 % O2 Flow Rate (L/min): 5 L/min FiO2 (%): 40 %   Physical Exam:   Constitutional: Chronically ill-appearing, resting in bed, BiPAP on HENT: normocephalic atraumatic, mucous membranes moist Cardiovascular: regular rate and rhythm, no m/r/g Pulmonary/Chest: No wheezing, rhonchi, rales appreciated.  BiPAP in place  [facility] Weights   03/28/23 1614 03/29/23 1220  Weight: 60.8 kg 59.2 kg     Intake/Output Summary (Last 24 hours) at 03/30/2023 1317 Last data filed at 03/30/2023 0934 Gross per 24 hour  Intake 240 ml  Output --  Net 240 ml   Net IO Since Admission: 240 mL [03/30/23 1317]  Pertinent Labs:    Latest Ref Rng & Units 03/30/2023    4:08 AM 03/29/2023    4:23 AM 03/29/2023    2:10 AM  CBC  WBC 4.0 - 10.5 K/uL 9.2  4.2    Hemoglobin 13.0 - 17.0 g/dL 9.3  75.6  43.3   Hematocrit 39.0 - 52.0 % 29.2  34.3  30.0   Platelets 150 - 400 K/uL 168  164         Latest Ref Rng & Units 03/30/2023    4:08 AM 03/29/2023    4:23 AM 03/29/2023    2:10 AM  CMP  Glucose 70 - 99 mg/dL 93  295    BUN 8 - 23 mg/dL 19  22    Creatinine 1.88 - 1.24 mg/dL 4.16  6.06    Sodium 301 - 145 mmol/L 141  135  137   Potassium 3.5 - 5.1 mmol/L 3.3   4.1  4.0   Chloride 98 - 111 mmol/L 88  86    CO2 22 - 32 mmol/L 42  37    Calcium 8.9 - 10.3 mg/dL 9.4  9.8      Imaging: No results found.  Assessment/Plan:   Principal Problem:   Acute hypercapnic respiratory failure (HCC) Active Problems:   Chronic respiratory failure with hypoxia, on home O2 therapy (HCC)   Prostate cancer metastatic to bone (HCC)   Allergic rhinitis   Decreased appetite   Anxiety   COPD exacerbation (HCC)   UTI (urinary tract infection)   Pyuria   Acute hypoxic respiratory failure (HCC)   Patient Summary: Frank [name] is a 67 y.o. with a pertinent PMH of metastatic prostate cancer as well as COPD who presented for shortness of breath and confusion.  Patient admitted for further evaluation and management of COPD exacerbation.  #Acute on chronic hypercapnic and hypoxemic respiratory failure #COPD exacerbation Patient on BiPAP,  with improved PCO2 80>>65, with a well compensated bicarb of 50.  On exam, he  appears uncomfortable.  He is treated with prednisone and azithromycin.  - Prednisone 40 mg day 3/5. -Continue Pulmicort nebulizers -Continue Yupelri nebulizers - Azithromycin 500 mg daily 2/5.   - Nightly BiPAP -Monitor VBG  #Urinary tract infection UA positive for leukocyte Estrace and nitrate. Urine culture growing Enterococcus faecalis.  -Switch ceftriaxone to Unasyn   #Metastatic prostate cancer No acute concerns at this time.  Palliative saw patient, patient wants to continue full scope of care while hospitalized.  Will proceed with hospice after discharge.  - Follow-up with palliative outpatient.  #Allergic rhinitis No acute concerns at this time -Continue supportive care  #Anxiety No acute concerns at this time. -Continue escitalopram  Diet: Normal IVF: None,None VTE: Enoxaparin Code: Full PT/OT recs: None, none.  Dispo: Anticipated discharge to Home in 2 days pending clinical improvement.   Laretta Bolster, M.D.   Internal Medicine Resident, PGY-1 [facility]  Pager: [phone] 1:28 PM, 03/30/2023   **Please contact the on call pager after 5 pm and on weekends at [phone].**

## 2023-03-30 NOTE — Progress Notes (Signed)
Daily Progress Note   Patient Name: Frank [name]       Date: 03/30/2023 DOB: 11-Apr-1956  Age: 67 y.o. MRN#: [id number] Attending Physician: Ginnie Smart, MD Primary Care Physician: Carmina Miller, DO Admit Date: 03/28/2023  Reason for Consultation/Follow-up: Establishing goals of care  Subjective: No complaints - requiring less O2, hopeful to go home soon  Length of Stay: 1  Current Medications: Scheduled Meds:   arformoterol  15 mcg Nebulization BID   azithromycin  500 mg Oral Daily   budesonide (PULMICORT) nebulizer solution  0.25 mg Nebulization BID   calcium-vitamin D  1 tablet Oral Q breakfast   enoxaparin (LOVENOX) injection  40 mg Subcutaneous Q24H   escitalopram  5 mg Oral Daily   feeding supplement  237 mL Oral BID BM   ipratropium-albuterol  3 mL Nebulization Q6H WA   loratadine  10 mg Oral Daily   oxymetazoline  1 spray Each Nare BID   predniSONE  40 mg Oral Q breakfast   revefenacin  175 mcg Nebulization Daily    Continuous Infusions:  ampicillin-sulbactam (UNASYN) IV 200 mL/hr at 03/30/23 1546    PRN Meds: acetaminophen, guaiFENesin, sodium chloride  Physical Exam Constitutional:      General: He is not in acute distress.    Appearance: He is ill-appearing.  Pulmonary:     Effort: Pulmonary effort is normal.  Skin:    General: Skin is warm and dry.  Neurological:     Mental Status: He is alert and oriented to person, place, and time.             Vital Signs: BP (!) 143/68 (BP Location: Right Arm)   Pulse 100   Temp 98.2 F (36.8 C) (Oral)   Resp 18   Ht 5\' 8"  (1.727 m)   Wt 59.2 kg   SpO2 92%   BMI 19.86 kg/m  SpO2: SpO2: 92 % O2 Device: O2 Device: High Flow Nasal Cannula O2 Flow Rate: O2 Flow Rate (L/min): 5 L/min  Intake/output summary:   Intake/Output Summary (Last 24 hours) at 03/30/2023 1837 Last data filed at 03/30/2023 1835 Gross per 24 hour  Intake 606.76 ml  Output 500 ml  Net 106.76 ml   LBM: Last BM Date : 03/28/23 Baseline Weight: Weight: 60.8 kg Most recent weight: Weight: 59.2 kg       Palliative Assessment/Data:PPS 40%      Patient Active Problem List   Diagnosis Date Noted   Pyuria 03/29/2023   Acute hypoxic respiratory failure (HCC) 03/29/2023   COPD exacerbation (HCC) 03/28/2023   Acute hypercapnic respiratory failure (HCC) 03/28/2023   UTI (urinary tract infection) 03/28/2023   Anxiety 10/11/2022   Acute blood loss anemia (ABLA) 09/25/2022   Urinary retention 09/08/2022   Pressure injury of skin 08/05/2022   Insomnia 11/15/2020   COVID-19 02/28/2020   Physical deconditioning 08/29/2019   Decreased appetite 07/08/2019   Healthcare maintenance 07/08/2019   Allergic rhinitis 05/30/2017   COPD (chronic obstructive pulmonary disease) (HCC) 11/27/2016   Advance care planning 10/23/2016   Diverticulosis of colon 06/09/2016   History of colonic polyps 06/17/2014   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   Chronic respiratory  failure with hypoxia, on home O2 therapy (HCC) 06/05/2012    Palliative Care Assessment & Plan   HPI: 67 y.o. male  with past medical history of COPD and metastatic prostate cancer admitted on 03/28/2023 with shortness of breath and confusion. Admitted for COPD exacerbation. Also being treated for UTI. PMT consulted to discuss GOC.   Assessment: Follow up today with patient. No family at bedside. He reports feeling improved, hopeful to go home soon. I review discussion yesterday - review plan to go home with hospice. He continues to agree. Call to [name]. She shares she heard from hospice liaison earlier and there was concern that patient was still on his zytiga and hospice would not cover but Frank [name] shares zytiga has been stopped so they are  hopeful to have hospice services at discharge. She has no questions or concerns.   Recommendations/Plan: Plan to dc home with hospice Will need more discussion about code status but patient is frustrated by frequency of code status discussions at this point, will defer to outpatient - he confirmed full code/full scope yesterday but does agree to hospice care once discharged, he understands focus on comfort and avoiding aggressive medical interventions  Discharge Planning: Home with Hospice  Care plan was discussed with patient and sister, hospice liaison  Thank you for allowing the Palliative Medicine Team to assist in the care of this patient.   Total Time 35 minutes Prolonged Time Billed  no   Time spent includes: Detailed review of medical records (labs, imaging, vital signs), medically appropriate exam, discussion with treatment team, counseling and educating patient, family and/or staff, documenting clinical information, medication management and coordination of care.     *Please note that this is a verbal dictation therefore any spelling or grammatical errors are due to the "Dragon Medical One" system interpretation.  Frank Ren, DNP, [facility] Palliative Medicine Team Team Phone # [phone]  Pager [phone]

## 2023-03-30 NOTE — Progress Notes (Signed)
Prior-To-Admission Oral Chemotherapy for Treatment of Oncologic Disease   Order noted from Dr. Benito Mccreedy to continue prior-to-admission oral chemotherapy regimen of Zytiga.  Procedure Per Pharmacy & Therapeutics Committee Policy: Orders for continuation of home oral chemotherapy for treatment of an oncologic disease will be held unless approved by an oncologist during current admission.    For patients receiving oncology care at [facility], inpatient pharmacist contacts patient's oncologist during regular office hours to review. If earlier review is medically necessary, attending physician consults [facility] on-call oncologist   For patients receiving oncology care outside of [facility], attending physician consults patient's oncologist to review. If this oncologist or their coverage cannot be reached, attending physician consults [facility] on-call oncologist   11/22 AM: Per team discussion w/ [facility] - hold Zytiga for now since receiving treatment for COPD exacerbation and undergoing work-up for UTI.   Rexford Maus, PharmD, BCPS 03/30/2023 11:14 AM

## 2023-03-30 NOTE — Progress Notes (Signed)
Mobility Specialist Progress Note:   03/30/23 1136  Mobility  Activity Ambulated with assistance in hallway  Level of Assistance Contact guard assist, steadying assist  Assistive Device Front wheel walker  Distance Ambulated (ft) 100 ft  Activity Response Tolerated well  Mobility Referral Yes  $Mobility charge 1 Mobility  Mobility Specialist Start Time (ACUTE ONLY) 1010  Mobility Specialist Stop Time (ACUTE ONLY) 1030  Mobility Specialist Time Calculation (min) (ACUTE ONLY) 20 min   Pre Mobility: 90 HR , 84% SpO2 5 L During Mobility: 106 HR , 79%-87% SpO2 6 - 8 L Post Mobility: 96 HR , 91% SpO2 5 L  Pt received in bed, agreeable to mobility. SB bed mobility. CG during ambulation. Pt c/o SOB prior to ambulation. Pt found with nasal canal slightly off nose with SpO2 in low 80's on 5 L. Ambulated on 6 L. Frequent standing breaks required d/t dizziness and SOB. Pt leaning on RW to recover.  SpO2 in high 70's on 6 L. With pursed lip breathing and increase of O2 to 8 L SpO2 elevated to max of 87% with good pleth. RN notified. Pt returned to bed with call bell in reach and all needs met.   Leory Plowman  Mobility Specialist Please contact via [employer] office at [phone]

## 2023-03-31 DIAGNOSIS — J441 Chronic obstructive pulmonary disease with (acute) exacerbation: Secondary | ICD-10-CM

## 2023-03-31 DIAGNOSIS — J9602 Acute respiratory failure with hypercapnia: Secondary | ICD-10-CM | POA: Diagnosis not present

## 2023-03-31 LAB — CBC
HCT: 29.7 % — ABNORMAL LOW (ref 39.0–52.0)
Hemoglobin: 9.4 g/dL — ABNORMAL LOW (ref 13.0–17.0)
MCH: 29 pg (ref 26.0–34.0)
MCHC: 31.6 g/dL (ref 30.0–36.0)
MCV: 91.7 fL (ref 80.0–100.0)
Platelets: 172 10*3/uL (ref 150–400)
RBC: 3.24 MIL/uL — ABNORMAL LOW (ref 4.22–5.81)
RDW: 13.2 % (ref 11.5–15.5)
WBC: 7.5 10*3/uL (ref 4.0–10.5)
nRBC: 0 % (ref 0.0–0.2)

## 2023-03-31 LAB — URINE CULTURE: Culture: >=100000 — AB

## 2023-03-31 LAB — BASIC METABOLIC PANEL
Anion gap: 7 (ref 5–15)
BUN: 12 mg/dL (ref 8–23)
CO2: 39 mmol/L — ABNORMAL HIGH (ref 22–32)
Calcium: 8.8 mg/dL — ABNORMAL LOW (ref 8.9–10.3)
Chloride: 95 mmol/L — ABNORMAL LOW (ref 98–111)
Creatinine, Ser: 0.73 mg/dL (ref 0.61–1.24)
GFR, Estimated: >60 mL/min (ref >60)
Glucose, Bld: 91 mg/dL (ref 70–99)
Potassium: 3.5 mmol/L (ref 3.5–5.1)
Sodium: 141 mmol/L (ref 135–145)

## 2023-03-31 MED ORDER — AZITHROMYCIN 250 MG PO TABS
250.0000 mg | ORAL_TABLET | Freq: Every day | ORAL | Status: DC
  Administered 2023-04-01 – 2023-04-02 (×2): 250 mg via ORAL
  Filled 2023-03-31 (×2): qty 1

## 2023-03-31 NOTE — Progress Notes (Signed)
   03/31/23 2026  BiPAP/CPAP/SIPAP  $ Non-Invasive Home Ventilator  Subsequent  Reason BIPAP/CPAP not in use Non-compliant (PT Refused cpap for the night)  BiPAP/CPAP /SiPAP Vitals  Bilateral Breath Sounds Diminished

## 2023-03-31 NOTE — Progress Notes (Signed)
HD#2 Subjective:   Summary: This is a 67 year old male with a past medical history of metastatic prostate cancer, end-stage COPD who presents with concerns of shortness of breath and urinary symptoms.  Patient admitted for further evaluation and management of COPD and urinary tract infection.  Overnight Events: No acute events  Patient reports that he is doing well.  He has no concerns this morning.  He notes that his breathing is much better.  He states he had a bowel movement yesterday with no concerns.  He states he is eating and drinking well.  Objective:  Vital signs in last 24 hours: Vitals:   03/30/23 1947 03/30/23 1952 03/30/23 2326 03/31/23 0309  BP:   119/63 (!) 109/58  Pulse:  84 65 71  Resp:  19 20 20   Temp:   97.9 F (36.6 C)   TempSrc:   Oral   SpO2: 95% 93% 91% 100%  Weight:      Height:       Supplemental O2: Nasal Cannula SpO2: 100 % O2 Flow Rate (L/min): (S) 5 L/min FiO2 (%): 40 %   Physical Exam:  Constitutional: Chronically ill-appearing, resting in bed, no acute distress Cardiovascular: regular rate and rhythm, no m/r/g Pulmonary/Chest: Decreased breath sounds heard throughout bilateral lung fields, no wheezes, rhonchi, or rales appreciated  Filed Weights   03/28/23 1614 03/29/23 1220  Weight: 60.8 kg 59.2 kg     Intake/Output Summary (Last 24 hours) at 03/31/2023 0635 Last data filed at 03/30/2023 1835 Gross per 24 hour  Intake 846.76 ml  Output 500 ml  Net 346.76 ml   Net IO Since Admission: 346.76 mL [03/31/23 0635]  Pertinent Labs:    Latest Ref Rng & Units 03/31/2023    2:40 AM 03/30/2023    4:08 AM 03/29/2023    4:23 AM  CBC  WBC 4.0 - 10.5 K/uL 7.5  9.2  4.2   Hemoglobin 13.0 - 17.0 g/dL 9.4  9.3  40.9   Hematocrit 39.0 - 52.0 % 29.7  29.2  34.3   Platelets 150 - 400 K/uL 172  168  164        Latest Ref Rng & Units 03/31/2023    2:40 AM 03/30/2023    4:08 AM 03/29/2023    4:23 AM  CMP  Glucose 70 - 99 mg/dL 91  93   811   BUN 8 - 23 mg/dL 12  19  22    Creatinine 0.61 - 1.24 mg/dL 9.14  7.82  9.56   Sodium 135 - 145 mmol/L 141  141  135   Potassium 3.5 - 5.1 mmol/L 3.5  3.3  4.1   Chloride 98 - 111 mmol/L 95  88  86   CO2 22 - 32 mmol/L 39  42  37   Calcium 8.9 - 10.3 mg/dL 8.8  9.4  9.8     Imaging: No results found.  Assessment/Plan:   Principal Problem:   Acute hypercapnic respiratory failure (HCC) Active Problems:   Chronic respiratory failure with hypoxia, on home O2 therapy (HCC)   Prostate cancer metastatic to bone (HCC)   Allergic rhinitis   Decreased appetite   Anxiety   COPD exacerbation (HCC)   UTI (urinary tract infection)   Pyuria   Acute hypoxic respiratory failure (HCC)   Patient Summary: Frank [name] is a 67 y.o. past medical history of metastatic prostate cancer, end-stage COPD who presents with concerns of shortness of breath and urinary symptoms.  Patient  admitted for further evaluation and management of COPD and urinary tract infection.  #Acute on chronic hypercapnic and hypoxemic respiratory failure #COPD exacerbation Patient is doing much better this morning.  He is back to his 5 L home oxygen.  He states his shortness of breath is improved, and he states that he is ready to go home.  VBG obtained yesterday showing improving pCO2.  Patient is on nightly BiPAP. -Prednisone day 4/5 -Continue Pulmicort nebulizer -Continue as needed nebulizers -Continue nightly BiPAP -Once patient is ready for discharge, can resume home inhalers -Finish off azithromycin today day 3, and can start back home dose tomorrow  #Urinary tract infection Yesterday urine cultures growing Enterococcus faecalis.  Today will be day 2 of Unasyn -Day 2 of Unasyn -Follow cultures for sensitivities -Will plan to transition to p.o. antibiotics when patient approaches discharge  #Metastatic prostate cancer No acute concerns at this time.  Reached out to urology who recommended holding his home  Zytiga while patient is inpatient.  Patient is back on palliative care to potentially go home on hospice following discharge. -Continue to follow-up palliative care -Hold Zytiga  Diet: Normal IVF: None,None VTE: Enoxaparin Code: Full PT/OT recs: Home Health  Dispo: Anticipated discharge to Home in 1 days pending clinical improvement.   Modena Slater DO Internal Medicine Resident PGY-2 [phone] Please contact the on call pager after 5 pm and on weekends at [phone].

## 2023-03-31 NOTE — Progress Notes (Signed)
Mobility Specialist Progress Note:   03/31/23 0911  Mobility  Activity Ambulated with assistance in hallway  Level of Assistance Contact guard assist, steadying assist  Assistive Device Front wheel walker  Distance Ambulated (ft) 80 ft  Range of Motion/Exercises Active;All extremities  Activity Response Tolerated well;Tolerated fair  Mobility Referral Yes  $Mobility charge 1 Mobility  Mobility Specialist Start Time (ACUTE ONLY) 1013  Mobility Specialist Stop Time (ACUTE ONLY) 1040  Mobility Specialist Time Calculation (min) (ACUTE ONLY) 27 min    Pre Mobility: 83 HR, 92% SpO2 5L During Mobility:  84% SpO2 8L Post Mobility:  96 HR,  90% SpO2 5L  Pt received in bed, agreeable to mobility. Desat to 84% on 8L O2 during ambulation. Took x3 standing rest breaks and encouraged pursed lipped breathing to recover. RN aware. Returned to room w/o fault. Pt left in bed with call bell and all needs met.  D'Vante Earlene Plater Mobility Specialist Please contact via Special educational needs teacher or Rehab office at [phone]

## 2023-03-31 NOTE — TOC Progression Note (Addendum)
Transition of Care [facility]) - Progression Note    Patient Details  Name: Frank [name] MRN: [id number] Date of Birth: 19-Oct-1955  [facility] CM/SW Contact  [name], RN Phone Number: 03/31/2023, 11:09 AM  Clinical Narrative:   Secure chat received about patient dispo plan of care. Spoke with patient by phone and he confirms that he would be going home with with hospice. Patient did not know what hospice agency he currently uses. Patient gave permission to call his sister [name] to get the information. Phone call made, voicemail left. Provider reports that patient will stay another day due to low oxygen sats.  1127: Return call received from Mrs. [name]. Patient and Family would like to use [facility] and that patient stopped taking Cancer medication on Thursday. Secure message to Norm Parcel, [facility] to inform of same.         Expected Discharge Plan and Services                                               Social Determinants of Health (SDOH) Interventions SDOH Screenings   Food Insecurity: No Food Insecurity (03/29/2023)  Housing: Medium Risk (03/29/2023)  Transportation Needs: No Transportation Needs (03/29/2023)  Utilities: Not At Risk (03/29/2023)  Alcohol Screen: Low Risk  (07/17/2022)  Depression (PHQ2-9): Low Risk  (07/17/2022)  Financial Resource Strain: Low Risk  (09/14/2022)  Physical Activity: Inactive (07/17/2022)  Social Connections: Socially Isolated (07/17/2022)  Stress: No Stress Concern Present (07/17/2022)  Tobacco Use: Medium Risk (03/28/2023)    Readmission Risk Interventions    09/26/2022   10:49 AM 09/11/2022   12:32 PM 08/08/2022   10:30 AM  Readmission Risk Prevention Plan  Transportation Screening Complete Complete Complete  PCP or Specialist Appt within 5-7 Days   Complete  PCP or Specialist Appt within 3-5 Days Complete Complete   Home Care Screening   Complete   Medication Review (RN CM)   Complete  HRI or Home Care Consult Complete Complete   Social Work Consult for Recovery Care Planning/Counseling Complete Complete   Palliative Care Screening Not Applicable Not Applicable   Medication Review [employer])  Complete

## 2023-04-01 DIAGNOSIS — N3000 Acute cystitis without hematuria: Secondary | ICD-10-CM | POA: Diagnosis not present

## 2023-04-01 DIAGNOSIS — J9602 Acute respiratory failure with hypercapnia: Secondary | ICD-10-CM | POA: Diagnosis not present

## 2023-04-01 DIAGNOSIS — J441 Chronic obstructive pulmonary disease with (acute) exacerbation: Secondary | ICD-10-CM | POA: Diagnosis not present

## 2023-04-01 LAB — CBC
HCT: 31.8 % — ABNORMAL LOW (ref 39.0–52.0)
Hemoglobin: 9.8 g/dL — ABNORMAL LOW (ref 13.0–17.0)
MCH: 28.2 pg (ref 26.0–34.0)
MCHC: 30.8 g/dL (ref 30.0–36.0)
MCV: 91.4 fL (ref 80.0–100.0)
Platelets: 178 10*3/uL (ref 150–400)
RBC: 3.48 MIL/uL — ABNORMAL LOW (ref 4.22–5.81)
RDW: 13.2 % (ref 11.5–15.5)
WBC: 6.1 10*3/uL (ref 4.0–10.5)
nRBC: 0 % (ref 0.0–0.2)

## 2023-04-01 LAB — BASIC METABOLIC PANEL
Anion gap: 10 (ref 5–15)
BUN: 12 mg/dL (ref 8–23)
CO2: 37 mmol/L — ABNORMAL HIGH (ref 22–32)
Calcium: 9.2 mg/dL (ref 8.9–10.3)
Chloride: 95 mmol/L — ABNORMAL LOW (ref 98–111)
Creatinine, Ser: 0.77 mg/dL (ref 0.61–1.24)
GFR, Estimated: >60 mL/min (ref >60)
Glucose, Bld: 94 mg/dL (ref 70–99)
Potassium: 3.7 mmol/L (ref 3.5–5.1)
Sodium: 142 mmol/L (ref 135–145)

## 2023-04-01 MED ORDER — IPRATROPIUM-ALBUTEROL 0.5-2.5 (3) MG/3ML IN SOLN
3.0000 mL | Freq: Two times a day (BID) | RESPIRATORY_TRACT | Status: DC
  Administered 2023-04-01 – 2023-04-02 (×2): 3 mL via RESPIRATORY_TRACT
  Filled 2023-04-01 (×2): qty 3

## 2023-04-01 NOTE — Progress Notes (Signed)
Mobility Specialist Progress Note:   04/01/23 1000  Oxygen Therapy  O2 Device HFNC  O2 Flow Rate (L/min) 8 L/min  Mobility  Activity Ambulated with assistance in hallway  Level of Assistance Contact guard assist, steadying assist  Assistive Device Front wheel walker  Distance Ambulated (ft) 80 ft  Activity Response Tolerated well  Mobility Referral Yes  $Mobility charge 1 Mobility  Mobility Specialist Start Time (ACUTE ONLY) 1028  Mobility Specialist Stop Time (ACUTE ONLY) 1048  Mobility Specialist Time Calculation (min) (ACUTE ONLY) 20 min    Pre Mobility:  84% SpO2 RA During Mobility:  93% SpO2 8L Post Mobility:  90% SpO2 8L   Pt received in bed, agreeable to mobility. Desat to 84% SpO2 on room air, requiring 8L O2 to return [facility]. Took x1 standing rest break and practiced purse lipped breathing. Pt left in bed with call bell and all needs met. RN aware.  D'Vante Earlene Plater Mobility Specialist Please contact via Special educational needs teacher or Rehab office at [phone]

## 2023-04-01 NOTE — Progress Notes (Addendum)
Patient stayed in 5l o2 40% Fio2 ventri mask most of the night. Sats did drop to 70's at times when sleeping. Refused CPAP. Tried to wean 02 on vemtrimask from 5l to 3l ,  Changed pt to 4l HFNC this am, and lowered down to 3l , sating 94-96%. Plan of care continues.

## 2023-04-01 NOTE — Progress Notes (Signed)
   04/01/23 2115  BiPAP/CPAP/SIPAP  $ Non-Invasive Home Ventilator  Subsequent  BiPAP/CPAP/SIPAP Resmed  Reason BIPAP/CPAP not in use Non-compliant (pt states cpap not comfortable)

## 2023-04-01 NOTE — Progress Notes (Signed)
Nurse requested Mobility Specialist to perform oxygen saturation test with pt which includes removing pt from oxygen both at rest and while ambulating.  Below are the results from that testing.     Patient Saturations on Room Air at Rest = spO2 84%  Patient Saturations on Room Air while Ambulating = sp02 81% .  Rested and performed pursed lip breathing for 1 minute with sp02 at 82%.  Patient Saturations on 8 Liters of oxygen while Ambulating = sp02 93%  At end of testing pt left in room on 8  Liters of oxygen.  Reported results to nurse.   D'Vante Earlene Plater Mobility Specialist Please contact via Special educational needs teacher or Rehab office at [phone]

## 2023-04-01 NOTE — Progress Notes (Signed)
HD#3 Subjective:   Summary: This is a 67 year old male with a past medical history of metastatic prostate cancer, end-stage COPD who presents with concerns of shortness of breath and urinary symptoms.  Patient admitted for further evaluation and management of COPD and urinary tract infection.  Overnight Events: No acute events  Patient reports he is doing well.  He states he is having some shortness of breath, but otherwise is feeling great.  He denies any chest pain.  He states he had a bowel movement.  He is eating and drinking well.  Upon further conversation, patient is unsure about hospice and is not clear on what this means.  He states he would like to think about this a little more.  Objective:  Vital signs in last 24 hours: Vitals:   03/31/23 2026 03/31/23 2301 04/01/23 0300 04/01/23 0341  BP:  126/68  135/72  Pulse: 73 80 73 76  Resp:  16  17  Temp:  98.7 F (37.1 C)  98.2 F (36.8 C)  TempSrc:  Oral  Oral  SpO2: 97% 96% 93% 96%  Weight:      Height:       Supplemental O2: Nasal Cannula SpO2: 96 % O2 Flow Rate (L/min): 3 L/min FiO2 (%): 40 %   Physical Exam:  Constitutional: Chronically ill-appearing, nasal cannula in place, resting in bed eating breakfast Cardiovascular: Regular rate and rhythm, no murmurs, rubs, or gallops Pulmonary/Chest: Decreased breath sounds heard throughout, no wheezes  Filed Weights   03/28/23 1614 03/29/23 1220  Weight: 60.8 kg 59.2 kg     Intake/Output Summary (Last 24 hours) at 04/01/2023 8295 Last data filed at 03/31/2023 2302 Gross per 24 hour  Intake 630 ml  Output 1300 ml  Net -670 ml   Net IO Since Admission: -323.24 mL [04/01/23 0702]  Pertinent Labs:    Latest Ref Rng & Units 04/01/2023    3:42 AM 03/31/2023    2:40 AM 03/30/2023    4:08 AM  CBC  WBC 4.0 - 10.5 K/uL 6.1  7.5  9.2   Hemoglobin 13.0 - 17.0 g/dL 9.8  9.4  9.3   Hematocrit 39.0 - 52.0 % 31.8  29.7  29.2   Platelets 150 - 400 K/uL 178  172  168         Latest Ref Rng & Units 04/01/2023    3:42 AM 03/31/2023    2:40 AM 03/30/2023    4:08 AM  CMP  Glucose 70 - 99 mg/dL 94  91  93   BUN 8 - 23 mg/dL 12  12  19    Creatinine 0.61 - 1.24 mg/dL 6.21  3.08  6.57   Sodium 135 - 145 mmol/L 142  141  141   Potassium 3.5 - 5.1 mmol/L 3.7  3.5  3.3   Chloride 98 - 111 mmol/L 95  95  88   CO2 22 - 32 mmol/L 37  39  42   Calcium 8.9 - 10.3 mg/dL 9.2  8.8  9.4     Imaging: No results found.  Assessment/Plan:   Principal Problem:   Acute hypercapnic respiratory failure (HCC) Active Problems:   Chronic respiratory failure with hypoxia, on home O2 therapy (HCC)   Prostate cancer metastatic to bone (HCC)   Allergic rhinitis   Decreased appetite   Anxiety   COPD exacerbation (HCC)   UTI (urinary tract infection)   Pyuria   Acute hypoxic respiratory failure (HCC)   Patient Summary: Frank [name] is a 67 y.o. past medical history of metastatic prostate cancer, end-stage COPD who presents with concerns of shortness of breath and urinary symptoms.  Patient admitted for further evaluation and management of COPD and urinary tract infection.  #Acute on chronic hypercapnic and hypoxemic respiratory failure #COPD exacerbation Patient is short of breath this morning.  He is down to 2 L nasal cannula, which is way below his home dose of oxygen of 5 L.  He states he is doing better.  He states he would like to go home.  Plan would be to let patient ambulate today and see how patient does with this.  Patient did not wear his BiPAP last night. -Prednisone day 5/5 -Continue Pulmicort nebulizers -Continue Brovana nebulizers Plan continue nightly BiPAP -Continue home dose of azithromycin to 50 mg -Ambulate patient today  #Urinary tract infection Patient will finish Unasyn course today. -Unasyn day 3 of 3  #Metastatic prostate cancer No acute concerns at this time.  Holding Zytiga.  There is some confusion on if patient wants to go home  with hospice or not.  Will reach out to patient's Sister [name] to figure out confusion on if patient wants home hospice or not.  Plan is to continue to hold Zytiga until discharge.  If patient does not go home with hospice, will restart Zytiga, but if he does go home with hospice, will discontinue Zytiga. -Continue to follow-up with palliative care conversations -Hold Zytiga for now, patient's decision for hospice will determine if we resume Zytiga or not  Diet: Normal IVF: None,None VTE: Enoxaparin Code: Full PT/OT recs: Home Health  Dispo: Anticipated discharge to Home in 1 days pending clinical improvement.   Modena Slater DO Internal Medicine Resident PGY-2 [phone] Please contact the on call pager after 5 pm and on weekends at [phone].

## 2023-04-02 ENCOUNTER — Telehealth (HOSPITAL_COMMUNITY): Payer: Self-pay | Admitting: Pharmacy Technician

## 2023-04-02 ENCOUNTER — Other Ambulatory Visit (HOSPITAL_COMMUNITY): Payer: Self-pay

## 2023-04-02 DIAGNOSIS — J9602 Acute respiratory failure with hypercapnia: Secondary | ICD-10-CM | POA: Diagnosis not present

## 2023-04-02 DIAGNOSIS — Z9981 Dependence on supplemental oxygen: Secondary | ICD-10-CM

## 2023-04-02 DIAGNOSIS — Z515 Encounter for palliative care: Secondary | ICD-10-CM | POA: Diagnosis not present

## 2023-04-02 DIAGNOSIS — F419 Anxiety disorder, unspecified: Secondary | ICD-10-CM

## 2023-04-02 DIAGNOSIS — Z7189 Other specified counseling: Secondary | ICD-10-CM | POA: Diagnosis not present

## 2023-04-02 DIAGNOSIS — J9611 Chronic respiratory failure with hypoxia: Secondary | ICD-10-CM

## 2023-04-02 DIAGNOSIS — J441 Chronic obstructive pulmonary disease with (acute) exacerbation: Secondary | ICD-10-CM | POA: Diagnosis not present

## 2023-04-02 LAB — BASIC METABOLIC PANEL
Anion gap: 6 (ref 5–15)
BUN: 15 mg/dL (ref 8–23)
CO2: 36 mmol/L — ABNORMAL HIGH (ref 22–32)
Calcium: 9 mg/dL (ref 8.9–10.3)
Chloride: 96 mmol/L — ABNORMAL LOW (ref 98–111)
Creatinine, Ser: 0.66 mg/dL (ref 0.61–1.24)
GFR, Estimated: >60 mL/min (ref >60)
Glucose, Bld: 104 mg/dL — ABNORMAL HIGH (ref 70–99)
Potassium: 4.6 mmol/L (ref 3.5–5.1)
Sodium: 138 mmol/L (ref 135–145)

## 2023-04-02 LAB — CBC
HCT: 32.7 % — ABNORMAL LOW (ref 39.0–52.0)
Hemoglobin: 10.3 g/dL — ABNORMAL LOW (ref 13.0–17.0)
MCH: 29.1 pg (ref 26.0–34.0)
MCHC: 31.5 g/dL (ref 30.0–36.0)
MCV: 92.4 fL (ref 80.0–100.0)
Platelets: 189 10*3/uL (ref 150–400)
RBC: 3.54 MIL/uL — ABNORMAL LOW (ref 4.22–5.81)
RDW: 13 % (ref 11.5–15.5)
WBC: 6.2 10*3/uL (ref 4.0–10.5)
nRBC: 0 % (ref 0.0–0.2)

## 2023-04-02 LAB — CULTURE, BLOOD (ROUTINE X 2)
Culture: NO GROWTH
Culture: NO GROWTH
Special Requests: ADEQUATE
Special Requests: ADEQUATE

## 2023-04-02 MED ORDER — INCRUSE ELLIPTA 62.5 MCG/ACT IN AEPB
1.0000 | INHALATION_SPRAY | Freq: Every day | RESPIRATORY_TRACT | 0 refills | Status: DC
  Filled 2023-04-02: qty 30, 30d supply, fill #0

## 2023-04-02 MED ORDER — TRELEGY ELLIPTA 200-62.5-25 MCG/ACT IN AEPB
1.0000 | INHALATION_SPRAY | Freq: Every day | RESPIRATORY_TRACT | 0 refills | Status: DC
  Filled 2023-04-02: qty 60, 30d supply, fill #0

## 2023-04-02 MED ORDER — AZITHROMYCIN 250 MG PO TABS: 250.0000 mg | ORAL_TABLET | Freq: Every day | ORAL | Status: DC

## 2023-04-02 MED ORDER — BUDESONIDE-FORMOTEROL FUMARATE 80-4.5 MCG/ACT IN AERO
2.0000 | INHALATION_SPRAY | Freq: Every day | RESPIRATORY_TRACT | 0 refills | Status: DC
  Filled 2023-04-02: qty 10.2, 30d supply, fill #0

## 2023-04-02 MED ORDER — ALUM & MAG HYDROXIDE-SIMETH 200-200-20 MG/5ML PO SUSP
15.0000 mL | ORAL | Status: DC | PRN
  Administered 2023-04-02: 15 mL via ORAL
  Filled 2023-04-02: qty 30

## 2023-04-02 NOTE — Care Management Important Message (Signed)
Important Message  Patient Details  Name: Frank [name] MRN: [id number] Date of Birth: 1955/09/09   Important Message Given:  Yes - Medicare IM     Dorena Bodo 04/02/2023, 3:32 PM

## 2023-04-02 NOTE — Progress Notes (Signed)
Nurse requested Mobility Specialist to perform oxygen saturation test with pt which includes removing pt from oxygen both at rest and while ambulating.  Below are the results from that testing.     Patient Saturations on Room Air at Rest = spO2 81%  Patient Saturations on Room Air while Ambulating = sp02 69% .  Rested and performed pursed lip breathing for 1 minute with sp02 at 74%.  Patient Saturations on 6 Liters of oxygen while Ambulating = sp02 90%  At end of testing pt left in room on 4  Liters of oxygen.  Reported results to nurse.

## 2023-04-02 NOTE — Care Management Important Message (Signed)
Important Message  Patient Details  Name: Frank [name] MRN: [id number] Date of Birth: 01/27/1956   Important Message Given:  Yes - Medicare IM     Sherilyn Banker 04/02/2023, 3:56 PM

## 2023-04-02 NOTE — Telephone Encounter (Signed)
Patient Product/process development scientist completed.    The patient is insured through [facility]. Patient has Medicare and is not eligible for a copay card, but may be able to apply for patient assistance, if available.    Ran test claim for Breztri 160-9-4.8 mcg and the current 30 day co-pay is $152.98 due to being in Coverage Gap (donut hole).  Ran test claim for Trelegy Ellipta 200-62.5-25 mcg and the current 30 day co-pay is $155.95 due to being in Coverage Gap (donut hole).  This test claim was processed through [facility]- copay amounts may vary at other pharmacies due to pharmacy/plan contracts, or as the patient moves through the different stages of their insurance plan.     Roland Earl, CPHT Pharmacy Technician III Certified Patient Advocate [facility] Patient Advocate Team Direct Number: [phone]  Fax: [phone]

## 2023-04-02 NOTE — Progress Notes (Signed)
Discharge instructions (including medications) discussed with and copy provided to patient/caregiver 

## 2023-04-02 NOTE — Progress Notes (Signed)
Physical Therapy Treatment Patient Details Name: Frank [name] MRN: [id number] DOB: Jun 22, 1955 Today's Date: 04/02/2023   History of Present Illness Frank [name] is a 67 year old male with a history of COPD and metastatic prostate cancer presenting to the ED with shortness of breath and confusion and admitted for a COPD exacerbation.    PT Comments  Pt received in bed and had recently ambulated with mobility team. Session focused on balance activities and simulating home tasks and how he will do them safely. Pt has a rollator for energy conservation but does not use when preparing meals in kitchen so worked on short distance ambulation without AD. CGA needed but pt reports his sister will be with him most of the time. SPO2 89-94% on 5L O2. Recommend HHPT. PT will continue to follow.     If plan is discharge home, recommend the following: A little help with walking and/or transfers;A little help with bathing/dressing/bathroom;Assistance with cooking/housework;Assist for transportation;Help with stairs or ramp for entrance;Supervision due to cognitive status;Direct supervision/assist for medications management   Can travel by private vehicle        Equipment Recommendations  None recommended by PT    Recommendations for Other Services       Precautions / Restrictions Precautions Precautions: Fall Restrictions Weight Bearing Restrictions: No     Mobility  Bed Mobility Overal bed mobility: Modified Independent                  Transfers Overall transfer level: Needs assistance Equipment used: Rolling walker (2 wheels) Transfers: Sit to/from Stand Sit to Stand: Supervision           General transfer comment: Cues for hand placement    Ambulation/Gait Ambulation/Gait assistance: Contact guard assist Gait Distance (Feet): 10 Feet Assistive device: None Gait Pattern/deviations: Decreased stride length, Step-through pattern Gait velocity: decreased Gait velocity  interpretation: <1.31 ft/sec, indicative of household ambulator   General Gait Details: pt reports that he does not use his RW when preparing his meals in the kitchen at home so worked on short distance ambulation in room without AD with CGA. Instructed pt to use rollator for all longer distances for energy conservation   Stairs             Wheelchair Mobility     Tilt Bed    Modified Rankin (Stroke Patients Only)       Balance Overall balance assessment: Needs assistance Sitting-balance support: Bilateral upper extremity supported, Feet supported Sitting balance-Leahy Scale: Good Sitting balance - Comments: worked on reaching, catching, and throwing in multiple planes is sitting EOB   Standing balance support: Bilateral upper extremity supported, During functional activity Standing balance-Leahy Scale: Fair Standing balance comment: worked on standing balance activities inclusing standing with single UE support and no UE support and SL stance with UE support each side. Also worked on Scientist, research (physical sciences) with varying widths of base of support                            Cognition Arousal: Alert Behavior During Therapy: WFL for tasks assessed/performed Overall Cognitive Status: Within Functional Limits for tasks assessed                                          Exercises      General Comments General comments (skin integrity, edema, etc.):  SPO2 dropped to 89% one time but otherwise remained in low 90's on 5L O2. vc's for pursed lip breathing at times.      Pertinent Vitals/Pain Pain Assessment Pain Assessment: No/denies pain    Home Living                          Prior Function            PT Goals (current goals can now be found in the care plan section) Acute Rehab PT Goals Patient Stated Goal: to go home PT Goal Formulation: With patient Time For Goal Achievement: 04/12/23 Potential to Achieve Goals: Good Progress  towards PT goals: Progressing toward goals    Frequency    Min 1X/week      PT Plan      Co-evaluation              AM-PAC PT "6 Clicks" Mobility   Outcome Measure  Help needed turning from your back to your side while in a flat bed without using bedrails?: None Help needed moving from lying on your back to sitting on the side of a flat bed without using bedrails?: None Help needed moving to and from a bed to a chair (including a wheelchair)?: A Little Help needed standing up from a chair using your arms (e.g., wheelchair or bedside chair)?: A Little Help needed to walk in hospital room?: A Little Help needed climbing 3-5 steps with a railing? : A Lot 6 Click Score: 19    End of Session Equipment Utilized During Treatment: Oxygen Activity Tolerance: Patient tolerated treatment well Patient left: in bed;with call bell/phone within reach Nurse Communication: Mobility status PT Visit Diagnosis: Other abnormalities of gait and mobility (R26.89)     Time: 2595-6387 PT Time Calculation (min) (ACUTE ONLY): 30 min  Charges:    $Therapeutic Exercise: 23-37 mins PT General Charges $$ ACUTE PT VISIT: 1 Visit                     Lyanne Co, PT  Acute Rehab Services Secure chat preferred Office [phone]    Lawana Chambers Ameliarose Shark 04/02/2023, 1:14 PM

## 2023-04-02 NOTE — Progress Notes (Signed)
Mobility Specialist Progress Note:   04/02/23 1146  Mobility  Activity Ambulated with assistance in hallway  Level of Assistance Contact guard assist, steadying assist  Assistive Device Front wheel walker  Distance Ambulated (ft) 150 ft  Activity Response Tolerated well  Mobility Referral Yes  $Mobility charge 1 Mobility  Mobility Specialist Start Time (ACUTE ONLY) 0930  Mobility Specialist Stop Time (ACUTE ONLY) 0950  Mobility Specialist Time Calculation (min) (ACUTE ONLY) 20 min   Pre Mobility: 91 HR ,  81% SpO2 RA During Mobility: 109 HR ,  69%-90% SpO2 RA-6 L Post Mobility: 86 HR , 94% SpO2 4 L  Pt received in bed, agreeable to mobility and O2 ambulatory test. Originally on 4 L with SpO2 at 94%. Desat to 81% on RA at rest. Pt c/o of SOB and dizziness requesting extra time before standing. During ambulation pt desat to 69% with good pleth. After pursed lip breathing pt elevated to 74% on RA. SpO2 peaked at 90% on 6 L. Pt denied any dizziness during ambulation and agreeable to ambulate greater distance. Pt returned to bed asymptomatic and left on 4 L with VSS. RN notified.   Leory Plowman  Mobility Specialist Please contact via [employer] office at [phone]

## 2023-04-02 NOTE — Discharge Instructions (Addendum)
FOLLOW-UP INSTRUCTIONS:  Thank you for allowing [place] to be part of your care. You were hospitalized for COPD exacerbation.  Please follow up with the following providers: A.   PCP, in  [facility] on December 9 at 10:45 AM.  B.  Pulmonologist, Dr. Solon [place].   Please note these changes made to your medications:   A. Medications to continue: Current Meds  Medication Sig   abiraterone acetate (ZYTIGA) 250 MG tablet Take 1,000 mg by mouth in the morning.   albuterol (ACCUNEB) 0.63 MG/3ML nebulizer solution USE 1 VIAL VIA NEBULIZER EVERY 6 HOURS (Patient taking differently: Take 1 ampule by nebulization every 6 (six) hours as needed for wheezing or shortness of breath.)   albuterol (VENTOLIN HFA) 108 (90 Base) MCG/ACT inhaler INHALE 2 PUFFS BY MOUTH EVERY 4 HOURS AS NEEDED FOR WHEEZING OR SHORTNESS OF BREATH (Patient taking differently: Inhale 1 puff into the lungs every 4 (four) hours as needed for shortness of breath (COPD).)   Calcium Carb-Cholecalciferol (OYSTER SHELL CALCIUM/D3 PO) Take 1 tablet by mouth daily with breakfast. 600 mg   escitalopram (LEXAPRO) 5 MG tablet Take 1 tablet (5 mg total) by mouth daily.   fluticasone (FLONASE) 50 MCG/ACT nasal spray Place 1 spray into both nostrils daily. (Patient taking differently: Place 1 spray into both nostrils 2 (two) times daily as needed for allergies or rhinitis.)   guaiFENesin (MUCINEX) 600 MG 12 hr tablet Take 600 mg by mouth daily as needed for cough or to loosen phlegm.   loratadine (CLARITIN) 10 MG tablet Take 1 tablet (10 mg total) by mouth daily.   naproxen sodium (ALEVE) 220 MG tablet Take 220 mg by mouth daily as needed.   OXYGEN Inhale 6 L/min into the lungs continuous.   predniSONE (DELTASONE) 10 MG tablet Take 1 tablet (10 mg total) by mouth daily with breakfast.      B. Medications to start: - Trillegy Ellipta 100/62.5/25 mcg  C. Medications to discontinue:  -Zytica    Please make sure to return to the hospital if you have,  worsening fevers, shortness of breath or confusion.  Please call our clinic if you have any questions or concerns, we may be able to help and keep you from a long and expensive emergency room wait. Our clinic and after hours phone number is [phone], the best time to call is Monday through Friday 9 am to 4 pm but there is always someone available 24/7 if you have an emergency. If you need medication refills please notify your pharmacy one week in advance and they will send [place] a request.

## 2023-04-02 NOTE — Progress Notes (Signed)
Daily Progress Note   Patient Name: Frank [name]       Date: 04/02/2023 DOB: 1956-03-24  Age: 67 y.o. MRN#: [id number] Attending Physician: Reymundo Poll, MD Primary Care Physician: Carmina Miller, DO Admit Date: 03/28/2023 Length of Stay: 4 days  Reason for Consultation/Follow-up: Establishing goals of care  HPI/Patient Profile:  67 y.o. male with past medical history of COPD and metastatic prostate cancer admitted on 03/28/2023 with shortness of breath and confusion. Admitted for COPD exacerbation. Also being treated for UTI. PMT consulted to discuss GOC.   Subjective:   Subjective: Chart Reviewed. Updates received. Patient Assessed. Created space and opportunity for patient  and family to explore thoughts and feelings regarding current medical situation.  Today's Discussion: This morning I received a phone call requesting callback from the patient's sister [name].  I discussed the confusion about hospice and the patient indicating he is not sure.  She states that she spoke with him yesterday and after their conversation he is in agreement with hospice.  I told her I was planning to go see the patient to confirm this as well as check on him and I would follow-up with her later in the day.    Today saw the patient at bedside.  He was sitting at the edge of bed with physical therapy, about to attempt some ambulation.  We talked about the plan on discharging home with hospice which he agrees to.  I explained hospice, to ensure he had an understanding, as a service for patients who have a life expectancy of 6 months or less. The goal of hospice is the preservation of dignity and quality at the end phases of life. Under hospice care, the focus changes from curative to symptom relief. I explained the three setting where hospice services can be provided including the home, at a living facility (such as LTC SNF, Assisted Living, etc), and a hospice facility. I explained that acceptance  to hospice in any specific location is the final decision of the hospice medical director and bed availability, if applicable.  He verbalized understanding.  I asked if he had any questions or concerns.  He asked when he would be able to leave.  I told him that we can usually get people out with hospice pretty quickly.  It is possible today, more likely tomorrow.  He seemed to be excited about this.  I called his sister [name] back.  I explained that the patient confirms she is in agreement with hospice.  I shared that he would likely be discharged today versus tomorrow.  She states that hospice is contacted her and states that they will be out of the house tomorrow morning around 10 AM.  All equipment is already been delivered.  She is happy that the patient will be coming home with hospice services.  She feels that he is approaching end-of-life, and I agreed with this.  I ensured she had our contact information to call for any questions or concerns while he is admitted to the hospital.  I provided emotional and general support through therapeutic listening, empathy, sharing of stories, and other techniques. I answered all questions and addressed all concerns to the best of my ability.   Review of Systems  Constitutional:  Positive for fatigue.       Denies pain in general  Respiratory:  Negative for cough and shortness of breath.   Cardiovascular:  Negative for chest pain.  Gastrointestinal:  Negative for abdominal pain, nausea and  vomiting.  Neurological:  Positive for weakness.    Objective:   Vital Signs:  BP 130/73 (BP Location: Right Arm)   Pulse 79   Temp 97.9 F (36.6 C) (Oral)   Resp 18   Ht 5\' 8"  (1.727 m)   Wt 59.2 kg   SpO2 98%   BMI 19.86 kg/m   Physical Exam Vitals and nursing note reviewed.  Constitutional:      General: He is not in acute distress.    Appearance: He is ill-appearing.  HENT:     Head: Normocephalic and atraumatic.  Cardiovascular:     Rate and  Rhythm: Normal rate.  Pulmonary:     Effort: Pulmonary effort is normal. No respiratory distress.     Breath sounds: No wheezing or rhonchi.     Comments: Wearing nasal cannula oxygen Abdominal:     General: Abdomen is flat. There is no distension.     Palpations: Abdomen is soft.  Skin:    General: Skin is warm and dry.  Neurological:     General: No focal deficit present.     Mental Status: He is alert.  Psychiatric:        Mood and Affect: Mood normal.        Behavior: Behavior normal.     Palliative Assessment/Data: 60-70%    Existing Vynca/ACP Documentation: None  Assessment & Plan:   Impression: Present on Admission:  COPD exacerbation (HCC)  Prostate cancer metastatic to bone (HCC)  Allergic rhinitis  Anxiety  Decreased appetite  Acute hypoxic respiratory failure (HCC)  SUMMARY OF RECOMMENDATIONS   Remain full code for now, anticipate further discussions with hospice about CODE STATUS Plan discharge today versus tomorrow with home hospice Palliative medicine will follow-up tomorrow if the patient remains admitted  Symptom Management:  Per primary team PMT is available to assist as needed  Code Status: Full code  Prognosis: < 6 months  Discharge Planning: Home with Hospice  Discussed with: Patient, patient's family, medical team, nursing team, [facility] team, hospice liaison  Thank you for allowing [place] to participate in the care of HIEU RIEKEN PMT will continue to support holistically.  Time Total: 30 min  Detailed review of medical records (labs, imaging, vital signs), medically appropriate exam, discussed with treatment team, counseling and education to patient, family, & staff, documenting clinical information, medication management, coordination of care  Wynne Dust, NP Palliative Medicine Team  Team Phone # [phone] (Nights/Weekends)  01/04/2021, 8:17 AM

## 2023-04-02 NOTE — TOC Transition Note (Signed)
Transition of Care [facility]) - CM/SW Discharge Note   Patient Details  Name: Frank [name] MRN: [id number] Date of Birth: 12/28/1955  [facility] CM/SW Contact:  Gordy Clement, RN Phone Number: 04/02/2023, 1:58 PM   Clinical Narrative:     Patient will DC to home with Hospice today. RNCM spoke with patient and he will go home with his Sister who will transport. [employer] has been contacted to provide a portable tank and also to arrange to fill up portables at home. [facility] will [facility] patient this afternoon. All DME is in the home  No additional TOC needs            Patient Goals and CMS Choice      Discharge Placement                         Discharge Plan and Services Additional resources added to the After Visit Summary for                                       Social Determinants of Health (SDOH) Interventions SDOH Screenings   Food Insecurity: No Food Insecurity (03/29/2023)  Housing: Medium Risk (03/29/2023)  Transportation Needs: No Transportation Needs (03/29/2023)  Utilities: Not At Risk (03/29/2023)  Alcohol Screen: Low Risk  (07/17/2022)  Depression (PHQ2-9): Low Risk  (07/17/2022)  Financial Resource Strain: Low Risk  (09/14/2022)  Physical Activity: Inactive (07/17/2022)  Social Connections: Socially Isolated (07/17/2022)  Stress: No Stress Concern Present (07/17/2022)  Tobacco Use: Medium Risk (03/28/2023)     Readmission Risk Interventions    09/26/2022   10:49 AM 09/11/2022   12:32 PM 08/08/2022   10:30 AM  Readmission Risk Prevention Plan  Transportation Screening Complete Complete Complete  PCP or Specialist Appt within 5-7 Days   Complete  PCP or Specialist Appt within 3-5 Days Complete Complete   Home Care Screening   Complete  Medication Review (RN CM)   Complete  HRI or Home Care Consult Complete Complete   Social Work Consult for Recovery Care Planning/Counseling Complete Complete   Palliative Care  Screening Not Applicable Not Applicable   Medication Review Oceanographer)  Complete

## 2023-04-02 NOTE — Discharge Summary (Addendum)
Name: Frank [name] MRN: [id number] DOB: 1956/03/02 67 y.o. PCP: Carmina Miller, DO  Date of Admission: 03/28/2023  4:06 PM Date of Discharge: 04/02/2023 Attending Physician: Dr. Antony Contras  Discharge Diagnosis: Principal Problem:   Acute hypercapnic respiratory failure (HCC) Active Problems:   Chronic respiratory failure with hypoxia, on home O2 therapy (HCC)   Prostate cancer metastatic to bone (HCC)   Allergic rhinitis   Decreased appetite   Anxiety   COPD exacerbation (HCC)   UTI (urinary tract infection)   Pyuria   Acute hypoxic respiratory failure (HCC)    Discharge Medications: Allergies as of 04/02/2023   No Known Allergies      Medication List     STOP taking these medications    abiraterone acetate 250 MG tablet Commonly known as: ZYTIGA       TAKE these medications    albuterol 108 (90 Base) MCG/ACT inhaler Commonly known as: Ventolin HFA INHALE 2 PUFFS BY MOUTH EVERY 4 HOURS AS NEEDED FOR WHEEZING OR SHORTNESS OF BREATH What changed:  how much to take how to take this when to take this reasons to take this additional instructions   albuterol 0.63 MG/3ML nebulizer solution Commonly known as: ACCUNEB USE 1 VIAL VIA NEBULIZER EVERY 6 HOURS What changed: See the new instructions.   azithromycin 250 MG tablet Commonly known as: ZITHROMAX Take 1 tablet (250 mg total) by mouth daily.   escitalopram 5 MG tablet Commonly known as: Lexapro Take 1 tablet (5 mg total) by mouth daily.   fluticasone 50 MCG/ACT nasal spray Commonly known as: FLONASE Place 1 spray into both nostrils daily. What changed:  when to take this reasons to take this   guaiFENesin 600 MG 12 hr tablet Commonly known as: MUCINEX Take 600 mg by mouth daily as needed for cough or to loosen phlegm.   loratadine 10 MG tablet Commonly known as: CLARITIN Take 1 tablet (10 mg total) by mouth daily.   naproxen sodium 220 MG tablet Commonly known as: ALEVE Take 220 mg by  mouth daily as needed.   OXYGEN Inhale 6 L/min into the lungs continuous.   OYSTER SHELL CALCIUM/D3 PO Take 1 tablet by mouth daily with breakfast. 600 mg   predniSONE 10 MG tablet Commonly known as: DELTASONE Take 1 tablet (10 mg total) by mouth daily with breakfast.   Trelegy Ellipta 200-62.5-25 MCG/ACT Aepb Generic drug: Fluticasone-Umeclidin-Vilant Inhale 1 puff into the lungs daily.        Disposition and follow-up:   Frank [name] was discharged from [facility] in [place] condition.  At the hospital follow up visit please address:  1.  Follow-up:  a.  COPD exacerbation. Stable on discharge, and 6 L of supplemental oxygen with ambulation.   -He was discharged with  Trelegy 100/62.5/25 mcg.   b.  Metastatic prostate cancer Discontinued Zytiga, patient is discharging home with home hospice.    2.  Labs / imaging needed at time of follow-up: CBC and VBG  3.  Pending labs/ test needing follow-up: None  4.  Medication Changes: Discontinued Zytica Abx -   End Date:  Follow-up Appointments:   Hospital Course by problem list:  Frank [name] is a 67 year old male with a history of COPD and metastatic prostate cancer presenting to the ED with shortness of breath and confusion and admitted for a COPD exacerbation.   #COPD exacerbation.  On presentation, he had worsening productive cough, requiring 6 L of supplemental oxygen, up from  5 L that he uses outpatient. VBG with elevated pCO2 of 80, very elevated bicarb.  He was treated with BIPAP, and  5-day course of prednisone 40 mg, and a 3-day course of azithromycin 500mg .  Patient with end-stage severe COPD.   Palliative was consulted , patient would like to pursue home hospice after discharge.  Will discharge patient on Trelegy.  On the day of discharge, patient required 6 L with ambulation, and 5 L at rest.    - Continue prednisone 10 mg. - Continue Azithromycin 250 mg. -Trelegy Ellipta 100/62.5/25  mcg - 6L of supplemental oxygen   UTI Had increased urinary frequency, no dysuria or vomiting.  UA with leukocytes and bacteriuria.  Urine culture grew pan-sensitive enterococcus. Received 3 days of tx with Unasyn   #Prostate cancer metastatic to bone Zytiga was held on admission.  Patient and his sister agreed to home hospice after discharge.  Will discontinue Zytica on discharge.    #Allergic rhinitis Continue outpatient loratadine.   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  BMI is 19.  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       Discharge Subjective: Patient evaluated at bedside this AM.  He reports improvement in his breathing.  He is agreeable to discharge with home hospice.  Discharge Exam:   BP 128/76 (BP Location: Right Arm)   Pulse 88   Temp 98.9 F (37.2 C) (Oral)   Resp 18   Ht 5\' 8"  (1.727 m)   Wt 59.2 kg   SpO2 98%   BMI 19.86 kg/m    Constitutional: Chronically ill-appearing, not in acute distress.   Cardiovascular: regular rate and rhythm, no m/r/g Pulmonary/Chest: Diffuse bilateral wheezes. Psych: Mood and affect appropriate.   Pertinent Labs, Studies, and Procedures:     Latest Ref Rng & Units 04/02/2023    4:24 AM 04/01/2023    3:42 AM 03/31/2023    2:40 AM  CBC  WBC 4.0 - 10.5 K/uL 6.2  6.1  7.5   Hemoglobin 13.0 - 17.0 g/dL 40.9  9.8  9.4   Hematocrit 39.0 - 52.0 % 32.7  31.8  29.7   Platelets 150 - 400 K/uL 189  178  172        Latest Ref Rng & Units 04/02/2023    4:24 AM 04/01/2023    3:42 AM 03/31/2023    2:40 AM  CMP  Glucose 70 - 99 mg/dL 811  94  91   BUN 8 - 23 mg/dL 15  12  12    Creatinine 0.61 - 1.24 mg/dL 9.14  7.82  9.56   Sodium 135 - 145 mmol/L 138  142  141   Potassium 3.5 - 5.1 mmol/L 4.6  3.7  3.5   Chloride 98 - 111 mmol/L 96  95  95   CO2 22 - 32 mmol/L 36  37  39   Calcium 8.9 - 10.3 mg/dL 9.0  9.2  8.8     CT Head Wo Contrast  Result Date:  03/28/2023 CLINICAL DATA:  Altered level of consciousness EXAM: CT HEAD WITHOUT CONTRAST TECHNIQUE: Contiguous axial images were obtained from the base of the skull through the vertex without intravenous contrast. RADIATION DOSE REDUCTION: This exam was performed according to the departmental dose-optimization program which includes automated exposure control, adjustment of the mA and/or kV according to patient size and/or use of iterative reconstruction technique. COMPARISON:  None Available. FINDINGS: Brain: No acute infarct  or hemorrhage. Lateral ventricles and midline structures are unremarkable. No acute extra-axial fluid collections. No mass effect. Vascular: No hyperdense vessel or unexpected calcification. Skull: Normal. Negative for fracture or focal lesion. Sinuses/Orbits: Opacification of the right frontal sinus and right anterior ethmoid air cells. Remaining paranasal sinuses are clear. Other: None. IMPRESSION: 1. No acute intracranial process. 2. Right frontal and ethmoid sinus disease. Electronically Signed   By: Sharlet Salina M.D.   On: 03/28/2023 20:44   DG Chest Port 1 View  Result Date: 03/28/2023 CLINICAL DATA:  Shortness of breath. EXAM: PORTABLE CHEST 1 VIEW COMPARISON:  Chest radiograph dated 09/25/2022. FINDINGS: Background of emphysema. No focal consolidation, pleural effusion, pneumothorax. Nodular opacity in the right lower lobe seen on the CT not visualized on radiograph. Right lung base linear atelectasis/scarring. The cardiac silhouette is within normal limits. Atherosclerotic calcification of the aorta. No acute osseous pathology. IMPRESSION: 1. No active disease. 2. Emphysema. Electronically Signed   By: Elgie Collard M.D.   On: 03/28/2023 19:55     Discharge Instructions: Discharge Instructions     Call MD for:  difficulty breathing, headache or visual disturbances   Complete by: As directed    Diet - low sodium heart healthy   Complete by: As directed    Discharge  instructions   Complete by: As directed    Please follow up with the following providers: A.   PCP, in  [facility] on December 9 at 10:45 AM.  B.  Pulmonologist, Dr. Solon [place].   Increase activity slowly   Complete by: As directed    No wound care   Complete by: As directed        Signed: Laretta Bolster, MD 04/02/2023, 2:22 PM   Pager: [phone]

## 2023-04-03 ENCOUNTER — Telehealth: Payer: Self-pay

## 2023-04-03 NOTE — Patient Outreach (Addendum)
  Care Coordination   Initial Visit Note   04/03/2023 Name: Frank [name] MRN: [id number] DOB: [birth year]  Frank [name] is a 66 y.o. year old male who sees Carmina Miller, DO for primary care. I spoke with  Frank [name] by phone today. What matters to the patients health and wellness today?  [name] was discharged from  [facility] on 04/02/23. Verified with [name] is seeing him at 11:00am today.  Goals Addressed   None   SDOH assessments and interventions completed:  No Care Coordination Interventions:  No, not indicated  Follow up plan: No further intervention required. [name] to call [facility] if any needs arise. Encounter Outcome:  [name] Visit Completed   Jodelle Gross RN, BSN, CCM RN Care Manager  Transitions of Care  [facility]  [phone]

## 2023-04-09 ENCOUNTER — Telehealth: Payer: Self-pay | Admitting: Emergency Medicine

## 2023-04-09 NOTE — Telephone Encounter (Signed)
Patient's sister is calling to let Dr.[name] know that the patient is now under hospice care.

## 2023-04-11 NOTE — Telephone Encounter (Signed)
Thank you for letting me know

## 2023-04-16 ENCOUNTER — Encounter: Payer: Medicare Other | Admitting: Student

## 2023-08-07 DEATH — deceased

## 2024-03-26 ENCOUNTER — Other Ambulatory Visit (HOSPITAL_COMMUNITY): Payer: Self-pay
